# Patient Record
Sex: Female | Born: 1935 | Race: White | Hispanic: No | Smoking: Never smoker
Health system: Southern US, Community
[De-identification: ages and names within clinical notes are randomized; demographics above are authoritative.]

## PROBLEM LIST (undated history)

## (undated) DIAGNOSIS — Z8719 Personal history of other diseases of the digestive system: Secondary | ICD-10-CM

## (undated) DIAGNOSIS — F0390 Unspecified dementia without behavioral disturbance: Secondary | ICD-10-CM

## (undated) DIAGNOSIS — J189 Pneumonia, unspecified organism: Secondary | ICD-10-CM

## (undated) DIAGNOSIS — A071 Giardiasis [lambliasis]: Secondary | ICD-10-CM

## (undated) DIAGNOSIS — M1990 Unspecified osteoarthritis, unspecified site: Secondary | ICD-10-CM

## (undated) DIAGNOSIS — B80 Enterobiasis: Secondary | ICD-10-CM

## (undated) DIAGNOSIS — C50919 Malignant neoplasm of unspecified site of unspecified female breast: Secondary | ICD-10-CM

## (undated) DIAGNOSIS — K219 Gastro-esophageal reflux disease without esophagitis: Secondary | ICD-10-CM

## (undated) DIAGNOSIS — I509 Heart failure, unspecified: Secondary | ICD-10-CM

## (undated) DIAGNOSIS — D849 Immunodeficiency, unspecified: Secondary | ICD-10-CM

## (undated) DIAGNOSIS — E119 Type 2 diabetes mellitus without complications: Secondary | ICD-10-CM

## (undated) DIAGNOSIS — I350 Nonrheumatic aortic (valve) stenosis: Secondary | ICD-10-CM

## (undated) DIAGNOSIS — M353 Polymyalgia rheumatica: Secondary | ICD-10-CM

## (undated) DIAGNOSIS — I499 Cardiac arrhythmia, unspecified: Secondary | ICD-10-CM

## (undated) HISTORY — DX: Nonrheumatic aortic (valve) stenosis: I35.0

## (undated) HISTORY — PX: PARTIAL HYSTERECTOMY: SHX80

## (undated) HISTORY — DX: Type 2 diabetes mellitus without complications: E11.9

## (undated) HISTORY — PX: BREAST LUMPECTOMY: SHX2

## (undated) HISTORY — PX: TONSILLECTOMY: SUR1361

## (undated) HISTORY — PX: ABDOMINAL HYSTERECTOMY: SHX81

---

## 1999-10-09 ENCOUNTER — Encounter (INDEPENDENT_AMBULATORY_CARE_PROVIDER_SITE_OTHER): Payer: Self-pay | Admitting: Specialist

## 1999-10-09 ENCOUNTER — Encounter: Payer: Self-pay | Admitting: Surgery

## 1999-10-09 ENCOUNTER — Ambulatory Visit (HOSPITAL_COMMUNITY): Admission: RE | Admit: 1999-10-09 | Discharge: 1999-10-09 | Payer: Self-pay | Admitting: Surgery

## 1999-10-20 ENCOUNTER — Encounter: Admission: RE | Admit: 1999-10-20 | Discharge: 2000-01-18 | Payer: Self-pay | Admitting: Family Medicine

## 1999-10-24 ENCOUNTER — Encounter: Payer: Self-pay | Admitting: Surgery

## 1999-10-30 ENCOUNTER — Ambulatory Visit (HOSPITAL_COMMUNITY): Admission: RE | Admit: 1999-10-30 | Discharge: 1999-10-30 | Payer: Self-pay | Admitting: Surgery

## 1999-10-30 ENCOUNTER — Encounter: Payer: Self-pay | Admitting: Surgery

## 1999-10-30 ENCOUNTER — Encounter (INDEPENDENT_AMBULATORY_CARE_PROVIDER_SITE_OTHER): Payer: Self-pay | Admitting: Specialist

## 2000-01-19 ENCOUNTER — Encounter: Admission: RE | Admit: 2000-01-19 | Discharge: 2000-04-18 | Payer: Self-pay | Admitting: Radiation Oncology

## 2000-07-14 ENCOUNTER — Encounter: Admission: RE | Admit: 2000-07-14 | Discharge: 2000-07-14 | Payer: Self-pay | Admitting: Surgery

## 2000-07-14 ENCOUNTER — Encounter: Payer: Self-pay | Admitting: Surgery

## 2001-03-01 ENCOUNTER — Encounter (INDEPENDENT_AMBULATORY_CARE_PROVIDER_SITE_OTHER): Payer: Self-pay

## 2001-03-01 ENCOUNTER — Ambulatory Visit (HOSPITAL_COMMUNITY): Admission: RE | Admit: 2001-03-01 | Discharge: 2001-03-01 | Payer: Self-pay | Admitting: Gastroenterology

## 2001-05-02 ENCOUNTER — Encounter: Admission: RE | Admit: 2001-05-02 | Discharge: 2001-05-02 | Payer: Self-pay

## 2001-07-19 ENCOUNTER — Encounter: Admission: RE | Admit: 2001-07-19 | Discharge: 2001-07-19 | Payer: Self-pay | Admitting: Surgery

## 2001-07-19 ENCOUNTER — Encounter: Payer: Self-pay | Admitting: Surgery

## 2002-07-07 ENCOUNTER — Encounter: Admission: RE | Admit: 2002-07-07 | Discharge: 2002-07-07 | Payer: Self-pay | Admitting: Surgery

## 2002-07-07 ENCOUNTER — Encounter: Payer: Self-pay | Admitting: Surgery

## 2003-07-09 ENCOUNTER — Encounter: Admission: RE | Admit: 2003-07-09 | Discharge: 2003-07-09 | Payer: Self-pay | Admitting: Surgery

## 2003-07-09 ENCOUNTER — Encounter: Payer: Self-pay | Admitting: Surgery

## 2004-01-25 ENCOUNTER — Encounter: Admission: RE | Admit: 2004-01-25 | Discharge: 2004-01-25 | Payer: Self-pay | Admitting: Family Medicine

## 2004-08-22 ENCOUNTER — Encounter: Admission: RE | Admit: 2004-08-22 | Discharge: 2004-08-22 | Payer: Self-pay | Admitting: Surgery

## 2005-02-01 ENCOUNTER — Ambulatory Visit (HOSPITAL_COMMUNITY): Admission: RE | Admit: 2005-02-01 | Discharge: 2005-02-01 | Payer: Self-pay

## 2005-02-18 ENCOUNTER — Encounter: Admission: RE | Admit: 2005-02-18 | Discharge: 2005-02-18 | Payer: Self-pay

## 2005-02-27 ENCOUNTER — Ambulatory Visit (HOSPITAL_COMMUNITY): Admission: RE | Admit: 2005-02-27 | Discharge: 2005-02-27 | Payer: Self-pay

## 2005-04-21 ENCOUNTER — Encounter: Admission: RE | Admit: 2005-04-21 | Discharge: 2005-04-21 | Payer: Self-pay

## 2005-09-16 ENCOUNTER — Encounter: Admission: RE | Admit: 2005-09-16 | Discharge: 2005-09-16 | Payer: Self-pay | Admitting: Surgery

## 2005-10-12 ENCOUNTER — Encounter: Admission: RE | Admit: 2005-10-12 | Discharge: 2005-10-12 | Payer: Self-pay | Admitting: Surgery

## 2006-01-01 ENCOUNTER — Encounter: Admission: RE | Admit: 2006-01-01 | Discharge: 2006-01-01 | Payer: Self-pay

## 2006-10-18 ENCOUNTER — Encounter: Admission: RE | Admit: 2006-10-18 | Discharge: 2006-10-18 | Payer: Self-pay | Admitting: Surgery

## 2007-10-21 ENCOUNTER — Encounter: Admission: RE | Admit: 2007-10-21 | Discharge: 2007-10-21 | Payer: Self-pay | Admitting: Surgery

## 2008-10-22 ENCOUNTER — Encounter: Admission: RE | Admit: 2008-10-22 | Discharge: 2008-10-22 | Payer: Self-pay | Admitting: Family Medicine

## 2009-10-14 ENCOUNTER — Encounter: Admission: RE | Admit: 2009-10-14 | Discharge: 2009-10-14 | Payer: Self-pay | Admitting: Family Medicine

## 2010-10-18 ENCOUNTER — Encounter: Payer: Self-pay | Admitting: Surgery

## 2010-11-06 ENCOUNTER — Other Ambulatory Visit: Payer: Self-pay | Admitting: Family Medicine

## 2010-11-06 DIAGNOSIS — Z1239 Encounter for other screening for malignant neoplasm of breast: Secondary | ICD-10-CM

## 2010-12-15 ENCOUNTER — Ambulatory Visit
Admission: RE | Admit: 2010-12-15 | Discharge: 2010-12-15 | Disposition: A | Discharge status: Home / Self Care | Payer: Medicare Other | Source: Ambulatory Visit | Attending: Family Medicine | Admitting: Family Medicine

## 2010-12-15 DIAGNOSIS — Z1239 Encounter for other screening for malignant neoplasm of breast: Secondary | ICD-10-CM

## 2011-02-13 NOTE — Op Note (Signed)
[place]  Patient:    Martha Mcbride                        MRN: [id number] Proc. Date: 10/30/99 Adm. Date:  [id number] Disc. Date: [id number] Attending:  Charlton Haws                           Operative Report  ACCOUNT #:  [id number]  CCS#: [id number]  PREOPERATIVE DIAGNOSIS:  Carcinoma of right breast.  POSTOPERATIVE DIAGNOSIS:  Carcinoma of right breast.  OPERATION PERFORMED:  Needle localization excision of right breast cancer, lymphatic mapping, blue dye injection, sentinel node dissection.  SURGEON:  Currie Paris, M.D.  ANESTHESIA:  General endotracheal.  CLINICAL HISTORY:  The patient is a 77 year old with a small nodule on the right breast which large core biopsy indicated was malignant.  After a lengthy discussion with the patient, we elected, we elected to proceed to lumpectomy with sentinel  node dissection.  DESCRIPTION OF PROCEDURE:  The patient was brought to the operating room having had a guide wire placed by radiology and appropriate injection of radioactive isotope.  The patient was brought to the operating room and after satisfactory general endotracheal anesthesia had been obtained, the breast was prepped and draped. Actually prior to prepping, I injected about 4 cc of Lymphazurin blue around the lumpectomy site and a little bit underneath the nipple areolar complex.  This was massaged for five minutes.  Once the breast was prepped and draped, I made an elliptical incision going around the guide wire.  The guide wire entered at almost the midlateral position and tracked towards the nipple, so I started just lateral to the guide wire and took the incision all the way to the areolar margin.  I raised skin flaps in all directions staying initially superior, then lateral, then inferior and finally medial.  The incisions were then deepened almost to the chest wall in all directions, again starting  superior, then lateral, then inferior and then medial and the entire area around the guide wire excised.  The tip of the guide wire was found at the very medial aspect of the excision and the guide wire had been noted to go through the lesion and extend about 1.5 cm past the lesion.  This was sent for specimen localization and they subsequently called back indicating that the lesion was contained in the specimen.  To be sure I had absolutely good margins, I took another approximately 5 mm of margin in both superior and inferior aspects.  This was sent separately.  Bleeders were electrocoagulated.  When everything appeared dry, I put four metal clips to mark the area of excision and packed this wound waiting for pathology reports.  I then used the Neoprobe to identify and highlight an area in the axilla that appeared to be hot.  I could find no hot areas around the sternum.  A transverse incision was made over this area which was right at the axillary hair line. I went through the subcutaneous tissues and entered the axillary fat and initially saw a lymph node and using a Neoprobe, this was a quite hot lymph node. It was grasped with a Babcock and excised.  A little further dissection showed ome blue dye coming in and this was much more anterior and as the first node was almost in the middle of the axilla from an anterior-posterior direction.  The blue dye was traced and a small cluster of nodes, all of which had a little blue in them was  identified and excised.  This was sent as specimens.  These nodes were not hot but in using the probe, I found another hot area. This one adjacent to the first, did a little further dissection and found another node that appeared to be hot and this was excised and sent.  Pathology subsequently revealed that we had gotten a total of, I believe, six nodes, all of which were  negative.  This area was irrigated and when everything was dry,  closed with 3-0 Vicryl, followed by 4-0 Monocryl subcuticular.  The original lumpectomy site would not close with deep sutures as it deformed the breast, so I simply closed the skin ith staples.  I injected approximately 10 cc of 0.25% plain Marcaine in each site prior to closing to help with postoperative pain.  The patient tolerated the procedure well.  There were no operative complications. All counts were correct. DD:  10/30/99 TD:  10/31/99 Job: [id number]

## 2011-02-13 NOTE — Procedures (Signed)
[facility]  Patient:    Martha Mcbride, Martha Mcbride                       MRN: [id number] Proc. Date: 03/01/01 Adm. Date:  [id number] Attending:  Nelda Marseille CC:         Katherine Roan, M.D.  Dyanne Carrel, M.D.  Currie Paris, M.D.   Procedure Report  PROCEDURE:  Colonoscopy with polypectomy.  ENDOSCOPIST:  Petra Kuba, M.D.  INDICATIONS:  Patient with history of colon polyps and history of colon cancer due for colonic screening.  Consent was signed after risks, benefits, methods, and options were thoroughly discussed multiple times in the past.  MEDICINES USED:  Fentanyl 62.5 mcg, Versed 6 mg.  DESCRIPTION OF PROCEDURE:  Rectal inspection was pertinent for external hemorrhoids.  Digital exam was negative.  Video colonoscope was inserted and fairly easily advanced around the colon to the cecum.  This did require some abdominal pressure but no position changes.  The cecum was identified by the appendiceal orifice and the ileocecal valve.  In fact, the scope was inserted a short ways into the terminal ileum which was normal.  Photo documentation was obtained.  The scope was slowly withdrawn.  Prep was adequate.  There was some liquid stool that required washing and suctioning.  On slow withdrawal through the colon, the cecum, ascending, and transverse were normal.  In the proximal level of the splenic flexure, a tiny polyp was seen and was cold biopsied, and then a 4 mm sessile polyp was seen and was hot biopsied x 1. Both polyps were put in the same container.  The scope was further withdrawn. The sigmoid was tortuous, but no other polyps, lesions, masses, or other abnormalities were seen as we slowly withdrew back to the rectum.  Once back in the rectum, the scope retroflexed, pertinent for some internal hemorrhoids. The scope was then straightened; air was withdrawn, and the scope removed. The patient tolerated the procedure well.   There was no obvious immediate complication.  ENDOSCOPIC DIAGNOSIS: 1. Internal and external hemorrhoids. 2. Two tiny to small splenic flexure polyps, one cold biopsied and one hot    biopsied. 3. Otherwise within normal limits to the terminal ileum.  PLAN:  Yearly rectals and guaiacs per either Dr. Elana Alm or Dr. Manus Gunning. Happy to see back p.r.n., otherwise await pathology but consider repeat screening in five years if doing well medically, happy to see back sooner p.r.n. DD:  03/01/01 TD:  03/01/01 Job: [id number]

## 2011-05-14 ENCOUNTER — Other Ambulatory Visit: Payer: Self-pay | Admitting: Gastroenterology

## 2011-06-05 ENCOUNTER — Ambulatory Visit
Admission: RE | Admit: 2011-06-05 | Discharge: 2011-06-05 | Disposition: A | Discharge status: Home / Self Care | Payer: Medicare Other | Source: Ambulatory Visit | Attending: Family Medicine | Admitting: Family Medicine

## 2011-06-05 ENCOUNTER — Other Ambulatory Visit: Payer: Self-pay | Admitting: Family Medicine

## 2011-06-05 DIAGNOSIS — R42 Dizziness and giddiness: Secondary | ICD-10-CM

## 2011-10-01 ENCOUNTER — Other Ambulatory Visit: Payer: Self-pay | Admitting: Family Medicine

## 2011-10-01 ENCOUNTER — Ambulatory Visit
Admission: RE | Admit: 2011-10-01 | Discharge: 2011-10-01 | Disposition: A | Discharge status: Home / Self Care | Payer: Medicare Other | Source: Ambulatory Visit | Attending: Family Medicine | Admitting: Family Medicine

## 2011-10-01 DIAGNOSIS — R200 Anesthesia of skin: Secondary | ICD-10-CM

## 2011-10-01 DIAGNOSIS — R531 Weakness: Secondary | ICD-10-CM

## 2011-10-14 ENCOUNTER — Other Ambulatory Visit: Payer: Self-pay | Admitting: Internal Medicine

## 2011-10-14 DIAGNOSIS — M549 Dorsalgia, unspecified: Secondary | ICD-10-CM

## 2011-10-15 ENCOUNTER — Ambulatory Visit
Admission: RE | Admit: 2011-10-15 | Discharge: 2011-10-15 | Disposition: A | Discharge status: Home / Self Care | Payer: Medicare Other | Source: Ambulatory Visit | Attending: Internal Medicine | Admitting: Internal Medicine

## 2011-10-15 DIAGNOSIS — M549 Dorsalgia, unspecified: Secondary | ICD-10-CM

## 2011-10-22 ENCOUNTER — Other Ambulatory Visit: Payer: Medicare Other

## 2011-11-05 ENCOUNTER — Ambulatory Visit: Payer: Medicare Other | Admitting: Rehabilitative and Restorative Service Providers"

## 2011-11-12 ENCOUNTER — Ambulatory Visit: Payer: Medicare Other | Attending: Family Medicine | Admitting: Rehabilitative and Restorative Service Providers"

## 2011-11-12 ENCOUNTER — Encounter: Payer: Medicare Other | Admitting: Rehabilitative and Restorative Service Providers"

## 2011-11-12 DIAGNOSIS — IMO0001 Reserved for inherently not codable concepts without codable children: Secondary | ICD-10-CM | POA: Insufficient documentation

## 2011-11-12 DIAGNOSIS — H8110 Benign paroxysmal vertigo, unspecified ear: Secondary | ICD-10-CM | POA: Insufficient documentation

## 2011-11-12 DIAGNOSIS — R269 Unspecified abnormalities of gait and mobility: Secondary | ICD-10-CM | POA: Insufficient documentation

## 2011-11-17 ENCOUNTER — Ambulatory Visit: Payer: Medicare Other | Admitting: Rehabilitative and Restorative Service Providers"

## 2011-11-17 ENCOUNTER — Other Ambulatory Visit: Payer: Self-pay | Admitting: Family Medicine

## 2011-11-17 DIAGNOSIS — Z1231 Encounter for screening mammogram for malignant neoplasm of breast: Secondary | ICD-10-CM

## 2011-11-20 ENCOUNTER — Ambulatory Visit: Payer: Medicare Other | Admitting: Physical Therapy

## 2011-11-23 ENCOUNTER — Ambulatory Visit: Payer: Medicare Other | Admitting: Rehabilitative and Restorative Service Providers"

## 2011-11-24 ENCOUNTER — Ambulatory Visit: Payer: Medicare Other | Admitting: Rehabilitative and Restorative Service Providers"

## 2011-11-26 ENCOUNTER — Encounter: Payer: Medicare Other | Admitting: Rehabilitative and Restorative Service Providers"

## 2011-11-30 ENCOUNTER — Ambulatory Visit: Payer: Medicare Other | Attending: Family Medicine | Admitting: Rehabilitative and Restorative Service Providers"

## 2011-11-30 DIAGNOSIS — IMO0001 Reserved for inherently not codable concepts without codable children: Secondary | ICD-10-CM | POA: Insufficient documentation

## 2011-11-30 DIAGNOSIS — R269 Unspecified abnormalities of gait and mobility: Secondary | ICD-10-CM | POA: Insufficient documentation

## 2011-11-30 DIAGNOSIS — H8110 Benign paroxysmal vertigo, unspecified ear: Secondary | ICD-10-CM | POA: Insufficient documentation

## 2011-12-03 ENCOUNTER — Ambulatory Visit: Payer: Medicare Other | Admitting: Rehabilitative and Restorative Service Providers"

## 2011-12-07 ENCOUNTER — Ambulatory Visit: Payer: Medicare Other | Admitting: Rehabilitative and Restorative Service Providers"

## 2011-12-10 ENCOUNTER — Ambulatory Visit: Payer: Medicare Other | Admitting: Rehabilitative and Restorative Service Providers"

## 2011-12-14 ENCOUNTER — Ambulatory Visit: Payer: Medicare Other | Admitting: Rehabilitative and Restorative Service Providers"

## 2011-12-16 ENCOUNTER — Ambulatory Visit: Payer: Medicare Other | Admitting: Rehabilitative and Restorative Service Providers"

## 2011-12-21 ENCOUNTER — Ambulatory Visit: Payer: Medicare Other | Admitting: Rehabilitative and Restorative Service Providers"

## 2011-12-21 ENCOUNTER — Ambulatory Visit
Admission: RE | Admit: 2011-12-21 | Discharge: 2011-12-21 | Disposition: A | Discharge status: Home / Self Care | Payer: Medicare Other | Source: Ambulatory Visit | Attending: Family Medicine | Admitting: Family Medicine

## 2011-12-21 DIAGNOSIS — Z1231 Encounter for screening mammogram for malignant neoplasm of breast: Secondary | ICD-10-CM

## 2011-12-22 ENCOUNTER — Ambulatory Visit: Payer: Medicare Other | Admitting: Rehabilitative and Restorative Service Providers"

## 2011-12-28 ENCOUNTER — Ambulatory Visit: Payer: Medicare Other | Attending: Family Medicine | Admitting: Rehabilitative and Restorative Service Providers"

## 2011-12-28 DIAGNOSIS — IMO0001 Reserved for inherently not codable concepts without codable children: Secondary | ICD-10-CM | POA: Insufficient documentation

## 2011-12-28 DIAGNOSIS — R269 Unspecified abnormalities of gait and mobility: Secondary | ICD-10-CM | POA: Insufficient documentation

## 2011-12-28 DIAGNOSIS — H8110 Benign paroxysmal vertigo, unspecified ear: Secondary | ICD-10-CM | POA: Insufficient documentation

## 2011-12-30 ENCOUNTER — Ambulatory Visit: Payer: Medicare Other | Admitting: Rehabilitative and Restorative Service Providers"

## 2012-01-04 ENCOUNTER — Encounter: Payer: Medicare Other | Admitting: Rehabilitative and Restorative Service Providers"

## 2012-01-06 ENCOUNTER — Ambulatory Visit: Payer: Medicare Other | Admitting: Rehabilitative and Restorative Service Providers"

## 2012-01-11 ENCOUNTER — Ambulatory Visit: Payer: Medicare Other | Admitting: Rehabilitative and Restorative Service Providers"

## 2012-01-13 ENCOUNTER — Encounter: Payer: Medicare Other | Admitting: Physical Therapy

## 2012-01-18 ENCOUNTER — Ambulatory Visit: Payer: Medicare Other | Admitting: Rehabilitative and Restorative Service Providers"

## 2012-01-20 ENCOUNTER — Ambulatory Visit: Payer: Medicare Other | Admitting: Rehabilitative and Restorative Service Providers"

## 2012-01-25 ENCOUNTER — Ambulatory Visit: Payer: Medicare Other | Admitting: Rehabilitative and Restorative Service Providers"

## 2012-02-17 ENCOUNTER — Encounter: Payer: Medicare Other | Admitting: Rehabilitative and Restorative Service Providers"

## 2012-02-23 ENCOUNTER — Encounter: Payer: Medicare Other | Admitting: Rehabilitative and Restorative Service Providers"

## 2012-03-04 ENCOUNTER — Encounter: Payer: Medicare Other | Admitting: Rehabilitative and Restorative Service Providers"

## 2012-03-15 ENCOUNTER — Ambulatory Visit: Payer: Medicare Other | Attending: Family Medicine | Admitting: Rehabilitative and Restorative Service Providers"

## 2012-03-15 DIAGNOSIS — R269 Unspecified abnormalities of gait and mobility: Secondary | ICD-10-CM | POA: Insufficient documentation

## 2012-03-15 DIAGNOSIS — IMO0001 Reserved for inherently not codable concepts without codable children: Secondary | ICD-10-CM | POA: Insufficient documentation

## 2012-03-15 DIAGNOSIS — H8110 Benign paroxysmal vertigo, unspecified ear: Secondary | ICD-10-CM | POA: Insufficient documentation

## 2012-04-13 ENCOUNTER — Other Ambulatory Visit: Payer: Self-pay | Admitting: Dermatology

## 2012-05-21 ENCOUNTER — Emergency Department (HOSPITAL_COMMUNITY): Payer: Medicare Other

## 2012-05-21 ENCOUNTER — Encounter (HOSPITAL_COMMUNITY): Payer: Self-pay

## 2012-05-21 ENCOUNTER — Observation Stay (HOSPITAL_COMMUNITY)
Admission: EM | Admit: 2012-05-21 | Discharge: 2012-05-21 | Disposition: A | Discharge status: Home / Self Care | Payer: Medicare Other | Attending: Emergency Medicine | Admitting: Emergency Medicine

## 2012-05-21 DIAGNOSIS — R112 Nausea with vomiting, unspecified: Secondary | ICD-10-CM

## 2012-05-21 DIAGNOSIS — D72829 Elevated white blood cell count, unspecified: Secondary | ICD-10-CM | POA: Insufficient documentation

## 2012-05-21 DIAGNOSIS — M3320 Polymyositis, organ involvement unspecified: Secondary | ICD-10-CM | POA: Insufficient documentation

## 2012-05-21 DIAGNOSIS — R109 Unspecified abdominal pain: Secondary | ICD-10-CM | POA: Insufficient documentation

## 2012-05-21 DIAGNOSIS — IMO0002 Reserved for concepts with insufficient information to code with codable children: Secondary | ICD-10-CM | POA: Insufficient documentation

## 2012-05-21 DIAGNOSIS — R197 Diarrhea, unspecified: Principal | ICD-10-CM

## 2012-05-21 DIAGNOSIS — N83209 Unspecified ovarian cyst, unspecified side: Secondary | ICD-10-CM | POA: Insufficient documentation

## 2012-05-21 DIAGNOSIS — N83299 Other ovarian cyst, unspecified side: Secondary | ICD-10-CM

## 2012-05-21 HISTORY — DX: Immunodeficiency, unspecified: D84.9

## 2012-05-21 HISTORY — DX: Polymyalgia rheumatica: M35.3

## 2012-05-21 HISTORY — DX: Unspecified osteoarthritis, unspecified site: M19.90

## 2012-05-21 LAB — URINALYSIS, ROUTINE W REFLEX MICROSCOPIC
Bilirubin Urine: NEGATIVE
Glucose, UA: NEGATIVE mg/dL
Ketones, ur: NEGATIVE mg/dL
Leukocytes, UA: NEGATIVE
Nitrite: NEGATIVE
Protein, ur: 30 mg/dL — AB
Specific Gravity, Urine: 1.021 (ref 1.005–1.030)
Urobilinogen, UA: 0.2 mg/dL (ref 0.0–1.0)
pH: 6 (ref 5.0–8.0)

## 2012-05-21 LAB — CBC WITH DIFFERENTIAL/PLATELET
Basophils Absolute: 0 10*3/uL (ref 0.0–0.1)
Basophils Relative: 0 % (ref 0–1)
Eosinophils Absolute: 0 10*3/uL (ref 0.0–0.7)
Eosinophils Relative: 0 % (ref 0–5)
HCT: 37.9 % (ref 36.0–46.0)
Hemoglobin: 12.5 g/dL (ref 12.0–15.0)
Lymphocytes Relative: 8 % — ABNORMAL LOW (ref 12–46)
Lymphs Abs: 1.8 10*3/uL (ref 0.7–4.0)
MCH: 27.3 pg (ref 26.0–34.0)
MCHC: 33 g/dL (ref 30.0–36.0)
MCV: 82.8 fL (ref 78.0–100.0)
Monocytes Absolute: 1.2 10*3/uL — ABNORMAL HIGH (ref 0.1–1.0)
Monocytes Relative: 5 % (ref 3–12)
Neutro Abs: 18.5 10*3/uL — ABNORMAL HIGH (ref 1.7–7.7)
Neutrophils Relative %: 86 % — ABNORMAL HIGH (ref 43–77)
Platelets: 301 10*3/uL (ref 150–400)
RBC: 4.58 MIL/uL (ref 3.87–5.11)
RDW: 16.5 % — ABNORMAL HIGH (ref 11.5–15.5)
WBC: 21.5 10*3/uL — ABNORMAL HIGH (ref 4.0–10.5)

## 2012-05-21 LAB — COMPREHENSIVE METABOLIC PANEL
ALT: 13 U/L (ref 0–35)
AST: 18 U/L (ref 0–37)
Albumin: 3.3 g/dL — ABNORMAL LOW (ref 3.5–5.2)
Alkaline Phosphatase: 47 U/L (ref 39–117)
BUN: 14 mg/dL (ref 6–23)
CO2: 23 mEq/L (ref 19–32)
Calcium: 8.9 mg/dL (ref 8.4–10.5)
Chloride: 98 mEq/L (ref 96–112)
Creatinine, Ser: 0.8 mg/dL (ref 0.50–1.10)
GFR calc Af Amer: 76 mL/min — ABNORMAL LOW (ref >90)
GFR calc non Af Amer: 65 mL/min — ABNORMAL LOW (ref >90)
Glucose, Bld: 132 mg/dL — ABNORMAL HIGH (ref 70–99)
Potassium: 3.7 mEq/L (ref 3.5–5.1)
Sodium: 134 mEq/L — ABNORMAL LOW (ref 135–145)
Total Bilirubin: 0.4 mg/dL (ref 0.3–1.2)
Total Protein: 7.1 g/dL (ref 6.0–8.3)

## 2012-05-21 LAB — URINE MICROSCOPIC-ADD ON

## 2012-05-21 MED ORDER — SODIUM CHLORIDE 0.9 % IV BOLUS (SEPSIS)
1000.0000 mL | Freq: Once | INTRAVENOUS | Status: AC
  Administered 2012-05-21: 1000 mL via INTRAVENOUS

## 2012-05-21 MED ORDER — ONDANSETRON 8 MG/NS 50 ML IVPB: 8.0000 mg | Freq: Once | INTRAVENOUS | Status: DC

## 2012-05-21 MED ORDER — IOHEXOL 300 MG/ML  SOLN
100.0000 mL | Freq: Once | INTRAMUSCULAR | Status: AC | PRN
  Administered 2012-05-21: 100 mL via INTRAVENOUS

## 2012-05-21 MED ORDER — ONDANSETRON HCL 4 MG PO TABS: 4.0000 mg | ORAL_TABLET | Freq: Four times a day (QID) | ORAL | Status: AC

## 2012-05-21 MED ORDER — ONDANSETRON HCL 4 MG/2ML IJ SOLN
4.0000 mg | Freq: Once | INTRAMUSCULAR | Status: AC
  Administered 2012-05-21: 4 mg via INTRAVENOUS
  Filled 2012-05-21: qty 2

## 2012-05-21 MED ORDER — MECLIZINE HCL 25 MG PO TABS: 25.0000 mg | ORAL_TABLET | Freq: Once | ORAL | Status: DC

## 2012-05-21 MED ORDER — IOHEXOL 300 MG/ML  SOLN
20.0000 mL | INTRAMUSCULAR | Status: AC
  Administered 2012-05-21: 20 mL via ORAL

## 2012-05-21 NOTE — ED Provider Notes (Signed)
History    87yF with n/v/d. Onset early this morning. Multiple episodes of each. NBNB emesis and no blood in stool or melena. No fever or chills. Family concerned that pt seemed disoriented when they first found her. Currently improved and back to baseline. No numbness, tingling or loss of strength. No fever or chills. Pt denies abdominal pain. No urinary complaints. No sick contacts. No prior hx of abdominal surgery.  CSN: [id number]  Arrival date & time 05/21/12  0919   First MD Initiated Contact with Patient 05/21/12 [phone]      Chief Complaint  Patient presents with  . Diarrhea    (Consider location/radiation/quality/duration/timing/severity/associated sxs/prior treatment) HPI  Past Medical History  Diagnosis Date  . Arthritis   . Polymyalgia   . Immune deficiency disorder     Past Surgical History  Procedure Date  . Breast lumpectomy     No family history on file.  History  Substance Use Topics  . Smoking status: Never Smoker   . Smokeless tobacco: Not on file  . Alcohol Use: No    OB History    Grav Para Term Preterm Abortions TAB SAB Ect Mult Living                  Review of Systems  Review of symptoms negative unless otherwise noted in HPI.  Allergies  Doxycycline and Sulfa antibiotics  Home Medications   Current Outpatient Rx  Name Route Sig Dispense Refill  . VIACTIV PO Oral Take 1 tablet by mouth daily.    Marland Kitchen PRESCRIPTION MEDICATION Oral Take 15 mg by mouth daily. Prednisone.    Marland Kitchen RABEPRAZOLE SODIUM 20 MG PO TBEC Oral Take 20 mg by mouth daily.      BP 160/55  Pulse 95  Temp 98.7 F (37.1 C) (Oral)  Resp 14  SpO2 97%  Physical Exam  Nursing note and vitals reviewed. Constitutional: She is oriented to person, place, and time. She appears well-developed and well-nourished. No distress.       Laying in bed. NAD. Pleasant.  HENT:  Head: Normocephalic and atraumatic.  Eyes: Conjunctivae are normal. Pupils are equal, round, and reactive to  light. Right eye exhibits no discharge. Left eye exhibits no discharge.  Neck: Neck supple.  Cardiovascular: Normal rate and regular rhythm.  Exam reveals no gallop and no friction rub.   Murmur heard. Pulmonary/Chest: Effort normal and breath sounds normal. No respiratory distress.  Abdominal: Soft. She exhibits no distension and no mass. There is no tenderness. There is no rebound and no guarding.  Musculoskeletal: She exhibits no edema and no tenderness.  Neurological: She is alert and oriented to person, place, and time. No cranial nerve deficit. She exhibits normal muscle tone. Coordination normal.  Skin: Skin is warm and dry. She is not diaphoretic.  Psychiatric: She has a normal mood and affect. Her behavior is normal. Thought content normal.    ED Course  Procedures (including critical care time)  Labs Reviewed  URINALYSIS, ROUTINE W REFLEX MICROSCOPIC - Abnormal; Notable for the following:    Hgb urine dipstick SMALL (*)     Protein, ur 30 (*)     All other components within normal limits  COMPREHENSIVE METABOLIC PANEL - Abnormal; Notable for the following:    Sodium 134 (*)     Glucose, Bld 132 (*)     Albumin 3.3 (*)     GFR calc non Af Amer 65 (*)     GFR calc Af Denyse Dago  76 (*)     All other components within normal limits  CBC WITH DIFFERENTIAL - Abnormal; Notable for the following:    WBC 21.5 (*)     RDW 16.5 (*)     Neutrophils Relative 86 (*)     Neutro Abs 18.5 (*)     Lymphocytes Relative 8 (*)     Monocytes Absolute 1.2 (*)     All other components within normal limits  URINE MICROSCOPIC-ADD ON   Ct Abdomen Pelvis W Contrast  05/21/2012  **ADDENDUM** CREATED: 05/21/2012 15:32:20  4 mm pulmonary nodules.  The findings are nonspecific. If the patient is at high risk for bronchogenic carcinoma, follow-up chest CT at 1 year is recommended.  If the patient is at low risk, no follow-up is needed.  This recommendation follows the consensus statement: Guidelines for  Management of Small Pulmonary Nodules Detected on CT Scans:  A Statement from the Fleischner Society as published in Radiology 2005; 237:395-400.  **END ADDENDUM** SIGNED BY: Richarda Overlie, M.D.   05/21/2012  *RADIOLOGY REPORT*  Clinical Data: Vomiting and abdominal pain.  CT ABDOMEN AND PELVIS WITH CONTRAST  Technique:  Multidetector CT imaging of the abdomen and pelvis was performed following the standard protocol during bolus administration of intravenous contrast.  Contrast: OMNIPAQUE IOHEXOL 300 MG/ML  SOLN  Comparison: 04/21/2005  Findings: There is a large hiatal hernia which has enlarged in the interim.  Prominent interstitial lung markings suggest chronic changes with multiple small blebs at the base of the right middle lobe.  Large mitral annular calcifications.  No evidence of free air.  Images are limited due to excessive patient motion.  Question a small nodule in the lingula on image #2, series #5. There is a 4 mm nodule in the right lower lung on image #5, sequence #5.  Low density structures within the liver suggestive for cysts.  No gross abnormality of the liver or portal venous system.  Evidence for multiple gallstones.  Gallbladder is not distended. Evidence for small cysts in the right kidney.  No gross abnormality to the spleen, pancreas or adrenal tissue.  There is a 6.7 x 6.2 x 5.6 cm structure in the right hemi pelvis. There may be a septation or adjacent cyst along the superior aspect of this cystic lesion. There is no contrast within the structure on the delayed images which suggests against a bladder diverticulum.  It is difficult to exclude mild edema around the pancreas but this area difficult to evaluate due to patient motion.  There are old right pubic rami fractures.  Old right rib fractures.  IMPRESSION: The study has limitations due to patient motion.  There is a fluid filled structure in the right hemi pelvis that measures up to 6.7 cm.  Findings are suggestive for a cyst.   There may be an adjacent small cyst or septation along the superior aspect of the cyst.  Findings could represent a peritoneal inclusion cyst or adnexal cyst.  Recommend further characterization with a pelvic ultrasound.  Cholelithiasis.  Mild stranding around the pancreas may be associated with the patient motion.  Recommend clinical correlation with regards to pancreatitis.  Large hiatal hernia.   Original Report Authenticated By: Richarda Overlie, M.D.      1. Nausea and vomiting   2. Diarrhea       MDM  87yF with n/v/d since early this morning. Benign abdominal exam. I suspect this is a viral illness. Pt currently feeling much better. Remains pain  free in ED and no more vomiting or diarrhea. Because of pt age though and significant leukocytosis, CT a/p was done. Questionable pancreatic inflammatory changes which does not clinically correlate. Pt has no tenderness in epigastrium or elsewhere for that matter. Tolerating PO. Cyst in pelvis noted though. [place] per radiology recommendations. Discussed having done as outpt. Apparently strong family hx of CA and multiple family members concerned. Do not feel unreasonable to have in ED prior to DC. GYN f/u if benign appearance or further testing recommended. Gyn/Onc if appears may be malignant. Incidental murmur noted on exam. Pt with no prior knowledge of one. Routine outpt cardiology FU. Pt reports previous catheterization but cannot remember by whom. Cards referral information provided.        Raeford Razor, MD 05/25/12 [phone]

## 2012-05-21 NOTE — ED Provider Notes (Signed)
Assumed pt care in CDU.  78 year old female presents for evaluation of diarrhea.  She was found to have leukocytosis with WBC 20+.  Sxs were suggestive with viral illness.  An abd/pelvic CT scan shows no acute finding except for cystic lesions in right hemi pelvis.  Due to family's concern of cancer, a pelvic US has been ordered.  Will dispo and refer pt based on result of [place].    6:34 PM Pelvic US result shows a mildly complex R ovarian/adnexal cyst which has increased in size since 2006.  Cystic neoplasm is not excluded, therefore i will consult unassigned gynecology.    6:52 PM  Pt currently taking Prednisone for polymyositis, therefore it is suspect that her leukocytosis is secondary to steroid use.  Pt has GI viral syndrome.  She has no B sxs suggestive of cancer.   I have consulted with Dr. Filbert Berthold, who agrees to have pt f/u outpatient for further evaluation.  I encourage PO fluid hydration with gatorade.  Strict return precaution given.  Family member will be available to assists pt once discharge.    Results for orders placed during the hospital encounter of 05/21/12  URINALYSIS, ROUTINE W REFLEX MICROSCOPIC      Component Value Range   Color, Urine YELLOW  YELLOW   APPearance CLEAR  CLEAR   Specific Gravity, Urine 1.021  1.005 - 1.030   pH 6.0  5.0 - 8.0   Glucose, UA NEGATIVE  NEGATIVE mg/dL   Hgb urine dipstick SMALL (*) NEGATIVE   Bilirubin Urine NEGATIVE  NEGATIVE   Ketones, ur NEGATIVE  NEGATIVE mg/dL   Protein, ur 30 (*) NEGATIVE mg/dL   Urobilinogen, UA 0.2  0.0 - 1.0 mg/dL   Nitrite NEGATIVE  NEGATIVE   Leukocytes, UA NEGATIVE  NEGATIVE  COMPREHENSIVE METABOLIC PANEL      Component Value Range   Sodium 134 (*) 135 - 145 mEq/L   Potassium 3.7  3.5 - 5.1 mEq/L   Chloride 98  96 - 112 mEq/L   CO2 23  19 - 32 mEq/L   Glucose, Bld 132 (*) 70 - 99 mg/dL   BUN 14  6 - 23 mg/dL   Creatinine, Ser 1.61  0.50 - 1.10 mg/dL   Calcium 8.9  8.4 - 09.6 mg/dL   Total Protein  7.1  6.0 - 8.3 g/dL   Albumin 3.3 (*) 3.5 - 5.2 g/dL   AST 18  0 - 37 U/L   ALT 13  0 - 35 U/L   Alkaline Phosphatase 47  39 - 117 U/L   Total Bilirubin 0.4  0.3 - 1.2 mg/dL   GFR calc non Af Amer 65 (*) >90 mL/min   GFR calc Af Amer 76 (*) >90 mL/min  CBC WITH DIFFERENTIAL      Component Value Range   WBC 21.5 (*) 4.0 - 10.5 K/uL   RBC 4.58  3.87 - 5.11 MIL/uL   Hemoglobin 12.5  12.0 - 15.0 g/dL   HCT 04.5  40.9 - 81.1 %   MCV 82.8  78.0 - 100.0 fL   MCH 27.3  26.0 - 34.0 pg   MCHC 33.0  30.0 - 36.0 g/dL   RDW 91.4 (*) 78.2 - 95.6 %   Platelets 301  150 - 400 K/uL   Neutrophils Relative 86 (*) 43 - 77 %   Neutro Abs 18.5 (*) 1.7 - 7.7 K/uL   Lymphocytes Relative 8 (*) 12 - 46 %   Lymphs  Abs 1.8  0.7 - 4.0 K/uL   Monocytes Relative 5  3 - 12 %   Monocytes Absolute 1.2 (*) 0.1 - 1.0 K/uL   Eosinophils Relative 0  0 - 5 %   Eosinophils Absolute 0.0  0.0 - 0.7 K/uL   Basophils Relative 0  0 - 1 %   Basophils Absolute 0.0  0.0 - 0.1 K/uL  URINE MICROSCOPIC-ADD ON      Component Value Range   Squamous Epithelial / LPF RARE  RARE   WBC, UA 0-2  <3 WBC/hpf   RBC / HPF 0-2  <3 RBC/hpf   US Pelvis Complete  05/21/2012  *RADIOLOGY REPORT*  Clinical Data: 78 year old female with right adnexal cyst identified on recent CT.  History of hysterectomy.  TRANSABDOMINAL ULTRASOUND OF PELVIS  Technique:  Transabdominal ultrasound examination of the pelvis was performed including evaluation of the uterus, ovaries, adnexal regions, and pelvic cul-de-sac.  Comparison:  05/21/2012, 04/21/2005 and prior CTs  Findings:  Uterus:  The uterus is not visualized compatible with hysterectomy.  Right ovary: A 6.2 x 3 x 5.7 cm mildly complex right ovarian/adnexal cyst is present. No normal right ovarian tissue is identified.  Left ovary: The left ovary is not visualized.  Other Findings:  There is no evidence of free fluid or left adnexal mass.  IMPRESSION: 6.2 x 3 x 5.7 cm mildly complex right ovarian/adnexal  cyst, increased in size since 2006. Cystic neoplasm is not excluded and recommend GYN consultation.   Original Report Authenticated By: Rosendo Gros, M.D.    Ct Abdomen Pelvis W Contrast  05/21/2012  **ADDENDUM** CREATED: 05/21/2012 15:32:20  4 mm pulmonary nodules.  The findings are nonspecific. If the patient is at high risk for bronchogenic carcinoma, follow-up chest CT at 1 year is recommended.  If the patient is at low risk, no follow-up is needed.  This recommendation follows the consensus statement: Guidelines for Management of Small Pulmonary Nodules Detected on CT Scans:  A Statement from the Fleischner Society as published in Radiology 2005; 237:395-400.  **END ADDENDUM** SIGNED BY: Richarda Overlie, M.D.   05/21/2012  *RADIOLOGY REPORT*  Clinical Data: Vomiting and abdominal pain.  CT ABDOMEN AND PELVIS WITH CONTRAST  Technique:  Multidetector CT imaging of the abdomen and pelvis was performed following the standard protocol during bolus administration of intravenous contrast.  Contrast: OMNIPAQUE IOHEXOL 300 MG/ML  SOLN  Comparison: 04/21/2005  Findings: There is a large hiatal hernia which has enlarged in the interim.  Prominent interstitial lung markings suggest chronic changes with multiple small blebs at the base of the right middle lobe.  Large mitral annular calcifications.  No evidence of free air.  Images are limited due to excessive patient motion.  Question a small nodule in the lingula on image #2, series #5. There is a 4 mm nodule in the right lower lung on image #5, sequence #5.  Low density structures within the liver suggestive for cysts.  No gross abnormality of the liver or portal venous system.  Evidence for multiple gallstones.  Gallbladder is not distended. Evidence for small cysts in the right kidney.  No gross abnormality to the spleen, pancreas or adrenal tissue.  There is a 6.7 x 6.2 x 5.6 cm structure in the right hemi pelvis. There may be a septation or adjacent cyst along  the superior aspect of this cystic lesion. There is no contrast within the structure on the delayed images which suggests against a bladder diverticulum.  It is difficult to exclude mild edema around the pancreas but this area difficult to evaluate due to patient motion.  There are old right pubic rami fractures.  Old right rib fractures.  IMPRESSION: The study has limitations due to patient motion.  There is a fluid filled structure in the right hemi pelvis that measures up to 6.7 cm.  Findings are suggestive for a cyst.  There may be an adjacent small cyst or septation along the superior aspect of the cyst.  Findings could represent a peritoneal inclusion cyst or adnexal cyst.  Recommend further characterization with a pelvic ultrasound.  Cholelithiasis.  Mild stranding around the pancreas may be associated with the patient motion.  Recommend clinical correlation with regards to pancreatitis.  Large hiatal hernia.   Original Report Authenticated By: Richarda Overlie, M.D.       Fayrene Helper, PA-C 05/21/12 1855

## 2012-05-21 NOTE — ED Notes (Signed)
Spoke with CDU RN, Lawson Fiscal.  CDU currently has a wait list.  Placed pt on wait list.  Lawson Fiscal, RN will call when ready for pt.

## 2012-05-21 NOTE — ED Notes (Signed)
Per EMS and pt's daughter, pt had dinner with family last night and was fine when they left her.  Pt found this morning to be covered in feces with no recollection of what happened.  Pt denies falling or pain.  Pt's daughter states that pt told her she had N/V.  No N/V/D at this time.  Pt's daughter also concerned b/c she feels pt's hands are discolored.

## 2012-05-22 NOTE — ED Provider Notes (Signed)
Medical screening examination/treatment/procedure(s) were performed by non-physician practitioner and as supervising physician I was immediately available for consultation/collaboration.  Griffyn Kucinski, MD 05/22/12 0751 

## 2012-11-29 ENCOUNTER — Other Ambulatory Visit: Payer: Self-pay

## 2012-11-29 DIAGNOSIS — Z1231 Encounter for screening mammogram for malignant neoplasm of breast: Secondary | ICD-10-CM

## 2012-12-26 ENCOUNTER — Ambulatory Visit
Admission: RE | Admit: 2012-12-26 | Discharge: 2012-12-26 | Disposition: A | Discharge status: Home / Self Care | Payer: Medicare Other | Source: Ambulatory Visit

## 2012-12-26 DIAGNOSIS — Z1231 Encounter for screening mammogram for malignant neoplasm of breast: Secondary | ICD-10-CM

## 2013-06-07 ENCOUNTER — Other Ambulatory Visit: Payer: Self-pay | Admitting: Dermatology

## 2014-02-10 ENCOUNTER — Emergency Department (HOSPITAL_BASED_OUTPATIENT_CLINIC_OR_DEPARTMENT_OTHER): Payer: Medicare Other

## 2014-02-10 ENCOUNTER — Encounter (HOSPITAL_BASED_OUTPATIENT_CLINIC_OR_DEPARTMENT_OTHER): Payer: Self-pay | Admitting: Emergency Medicine

## 2014-02-10 ENCOUNTER — Emergency Department (HOSPITAL_BASED_OUTPATIENT_CLINIC_OR_DEPARTMENT_OTHER)
Admission: EM | Admit: 2014-02-10 | Discharge: 2014-02-10 | Disposition: A | Discharge status: Home / Self Care | Payer: Medicare Other | Attending: Emergency Medicine | Admitting: Emergency Medicine

## 2014-02-10 DIAGNOSIS — Z8739 Personal history of other diseases of the musculoskeletal system and connective tissue: Secondary | ICD-10-CM | POA: Insufficient documentation

## 2014-02-10 DIAGNOSIS — R079 Chest pain, unspecified: Secondary | ICD-10-CM | POA: Insufficient documentation

## 2014-02-10 DIAGNOSIS — Z8639 Personal history of other endocrine, nutritional and metabolic disease: Secondary | ICD-10-CM | POA: Insufficient documentation

## 2014-02-10 DIAGNOSIS — R4789 Other speech disturbances: Secondary | ICD-10-CM | POA: Insufficient documentation

## 2014-02-10 DIAGNOSIS — IMO0002 Reserved for concepts with insufficient information to code with codable children: Secondary | ICD-10-CM | POA: Insufficient documentation

## 2014-02-10 DIAGNOSIS — Z79899 Other long term (current) drug therapy: Secondary | ICD-10-CM | POA: Insufficient documentation

## 2014-02-10 DIAGNOSIS — K219 Gastro-esophageal reflux disease without esophagitis: Secondary | ICD-10-CM | POA: Insufficient documentation

## 2014-02-10 DIAGNOSIS — Z853 Personal history of malignant neoplasm of breast: Secondary | ICD-10-CM | POA: Insufficient documentation

## 2014-02-10 DIAGNOSIS — R4702 Dysphasia: Secondary | ICD-10-CM

## 2014-02-10 DIAGNOSIS — Z862 Personal history of diseases of the blood and blood-forming organs and certain disorders involving the immune mechanism: Secondary | ICD-10-CM | POA: Insufficient documentation

## 2014-02-10 DIAGNOSIS — Z880 Allergy status to penicillin: Secondary | ICD-10-CM | POA: Insufficient documentation

## 2014-02-10 HISTORY — DX: Gastro-esophageal reflux disease without esophagitis: K21.9

## 2014-02-10 LAB — TROPONIN I

## 2014-02-10 NOTE — ED Notes (Signed)
Patient has been experiencing mid chest discomfort since the middle of the week and takes medicine for acid reflux.  Takes aciphex that usually relieves pain, but this pain has continued. Went to urgent care and sent her after having an EKG.

## 2014-02-10 NOTE — Discharge Instructions (Signed)
Your caregiver has diagnosed you as having chest pain that is not specific for one problem, but does not require admission.  You are at low risk for an acute heart condition or other serious illness. Chest pain comes from many different causes.  SEEK IMMEDIATE MEDICAL ATTENTION IF: You have severe chest pain, especially if the pain is crushing or pressure-like and spreads to the arms, back, neck, or jaw, or if you have sweating, nausea (feeling sick to your stomach), or shortness of breath. THIS IS AN EMERGENCY. Don't wait to see if the pain will go away. Get medical help at once. Call 911 or 0 (operator). DO NOT drive yourself to the hospital.  Your chest pain gets worse and does not go away with rest.  You have an attack of chest pain lasting longer than usual, despite rest and treatment with the medications your caregiver has prescribed.  You wake from sleep with chest pain or shortness of breath.  You feel dizzy or faint.  You have chest pain not typical of your usual pain for which you originally saw your caregiver.  Soft diet until recheck.

## 2014-02-10 NOTE — ED Provider Notes (Signed)
CSN: [id number]     Arrival date & time 02/10/14  1525 History  This chart was scribed for Martha Relic, MD by Marcha Dutton, ED Scribe. This patient was seen in room MH04/MH04 and the patient's care was started at 4:12 PM.    Chief Complaint  Patient presents with  . Chest Pain    The history is provided by the patient. No language interpreter was used.    HPI Comments: Martha Mcbride is a 80 y.o. female who presents to the Emergency Department complaining of CP that began about 3 days ago. Pt presented to the urgent care with this complaint and after having an abnormal EKG, she was directed to the ED. Pt reports at baseline she is healthy apart from polymyalgia and chronic acid reflux. She reports a h/o breast cancer almost 15 years ago. Pt states her reflux occurs maybe once a week. She typically controls her diet with positive results.  Pt had an appointment with her gastroenterologist 2 days ago for a routine appointment and has taken aciphex with her meals to treat her acid reflux. Pt had some discomfort after eating tacos two days ago and 2 or so hours of discomfort last night after eating pizza. She states today after eating coffee and eggs for breakfast around 8 AM, she had the pain for several hours. She is presently not feeling the pain and hasn't eaten anything since then. Pt states her pain is a non-radiating, mild, soreness without any associated symptoms such as nausea or vomiting. Her pain is worse with food, swallowing, and laying down and better with antacids. Her pain is nonexertional and nonpleuritic.   Past Medical History  Diagnosis Date  . Arthritis   . Polymyalgia   . Acid reflux   . Immune deficiency disorder     Past Surgical History  Procedure Laterality Date  . Breast lumpectomy      No family history on file. History  Substance Use Topics  . Smoking status: Never Smoker   . Smokeless tobacco: Not on file  . Alcohol Use: No    Review of  Systems 10 Systems reviewed and all are negative for acute change except as noted in the HPI.   Allergies  Doxycycline; Penicillins; and Sulfa antibiotics   Home Medications   Prior to Admission medications   Medication Sig Start Date End Date Taking? Authorizing Provider  antiseptic oral rinse (BIOTENE) LIQD 15 mLs by Mouth Rinse route as needed for dry mouth.   Yes Historical Provider, MD  FOLIC ACID PO Take by mouth.   Yes Historical Provider, MD  LEFLUNOMIDE PO Take by mouth.   Yes Historical Provider, MD  predniSONE (DELTASONE) 5 MG tablet Take 5 mg by mouth daily with breakfast.   Yes Historical Provider, MD  Calcium-Vitamin D-Vitamin K (VIACTIV PO) Take 1 tablet by mouth daily.    Historical Provider, MD  PRESCRIPTION MEDICATION Take 15 mg by mouth daily. Prednisone.    Historical Provider, MD  RABEprazole (ACIPHEX) 20 MG tablet Take 20 mg by mouth daily.    Historical Provider, MD    Triage Vitals: BP 158/67  Pulse 79  Temp(Src) 98.2 F (36.8 C) (Oral)  SpO2 97%   Physical Exam  Nursing note and vitals reviewed. Constitutional: She is oriented to person, place, and time. She appears well-developed and well-nourished. No distress.  Awake, alert, nontoxic appearance.  HENT:  Head: Normocephalic and atraumatic.  Eyes: EOM are normal. Pupils are equal, round, and  reactive to light. Right eye exhibits no discharge. Left eye exhibits no discharge.  Neck: Neck supple.  Cardiovascular: Normal rate, regular rhythm and normal heart sounds.  Exam reveals no gallop and no friction rub.   No murmur heard. Pulmonary/Chest: Effort normal and breath sounds normal. No respiratory distress. She has no wheezes. She has no rales. She exhibits no tenderness.  Abdominal: Soft. Bowel sounds are normal. There is no tenderness. There is no rebound.  Musculoskeletal: She exhibits no edema and no tenderness.  Baseline ROM, no obvious new focal weakness.  Neurological: She is alert and oriented  to person, place, and time.  Mental status and motor strength appears baseline for patient and situation.  Skin: Skin is warm and dry. No rash noted. She is not diaphoretic.  Psychiatric: She has a normal mood and affect. Her behavior is normal.    ED Course  Procedures (including critical care time)   DIAGNOSTIC STUDIES: Oxygen Saturation is 97% on RA, normal by my interpretation.     COORDINATION OF CARE: 4:22 PM- Patient / Family / Caregiver understand and agree with initial ED impression and plan with expectations set for ED visit. Verified EKG unchanged since 04/2012.   6:39 PM - Pt feels improved after observation and/or treatment in ED.    Labs Review Labs Reviewed  TROPONIN I    Imaging Review Dg Chest 2 View  02/10/2014   CLINICAL DATA:  Chest pain  EXAM: CHEST  2 VIEW  COMPARISON:  DG CHEST 1V PORT dated 02/10/2014; DG CHEST 2V dated 11/02/2011  FINDINGS: Heart size is mildly enlarged. Chronically prominent interstitial markings are noted without focal pulmonary opacity. Clips project over the right lower chest. No pleural effusion. No acute osseous abnormality. The patient is kyphotic with subjective osteopenia. The previously questioned left hilar mass is not reproduced, likely projectional.  IMPRESSION: Cardiomegaly with prominent interstitial markings but no focal acute finding.   Electronically Signed   By: Conchita Paris M.D.   On: 02/10/2014 18:32   Dg Chest Port 1 View  02/10/2014   CLINICAL DATA:  Chest pain.  EXAM: PORTABLE CHEST - 1 VIEW  COMPARISON:  DG CHEST 2V dated 11/02/2011; DG CHEST 2V dated 10/16/2011  FINDINGS: Patient is rotated to the left, limiting evaluation of the mediastinum. Masslike density within the left hilar region. Re- demonstrated bilateral coarse interstitial pulmonary opacities. Heart is stable in size. No large consolidative pulmonary opacities. No pleural effusion or pneumothorax.  IMPRESSION: Masslike density within the left hilar region is  likely secondary to pulmonary structures given patient rotation however underlying true mass is not excluded. Recommend correlation with repeat PA and lateral chest radiograph with patient in true AP positioning. If this is unable to be performed, correlation with CT can be obtained.  No acute cardiopulmonary process.   Electronically Signed   By: Lovey Newcomer M.D.   On: 02/10/2014 16:21      EKG Interpretation   Date/Time:  Saturday Feb 10 2014 16:13:01 EDT Ventricular Rate:  75 PR Interval:  168 QRS Duration: 88 QT Interval:  398 QTC Calculation: 444 R Axis:   -48 Text Interpretation:  Normal sinus rhythm Possible Left atrial enlargement  Left anterior fascicular block Left ventricular hypertrophy Anteroseptal  infarct , age undetermined No significant change since last tracing  Confirmed by [facility]  MD, Jenny Reichmann ([id number]) on 02/10/2014 4:24:45 PM      MDM  6:41 PM - Patient and Family informed of clinical course, understand medical decision-making  process, and agree with plan.  Final diagnoses:  Chest pain  Dysphasia    I doubt any other EMC precluding discharge at this time including, but not necessarily limited to the following:AMI.  I personally performed the services described in this documentation, which was scribed in my presence. The recorded information has been reviewed and is accurate.    Martha Relic, MD 02/12/14 [phone]

## 2014-06-02 ENCOUNTER — Observation Stay (HOSPITAL_COMMUNITY)
Admission: EM | Admit: 2014-06-02 | Discharge: 2014-06-04 | Disposition: A | Discharge status: Home / Self Care | Payer: Medicare Other | Attending: Internal Medicine | Admitting: Internal Medicine

## 2014-06-02 ENCOUNTER — Emergency Department (HOSPITAL_COMMUNITY): Payer: Medicare Other

## 2014-06-02 ENCOUNTER — Encounter (HOSPITAL_COMMUNITY): Payer: Self-pay | Admitting: Emergency Medicine

## 2014-06-02 DIAGNOSIS — R079 Chest pain, unspecified: Principal | ICD-10-CM | POA: Diagnosis present

## 2014-06-02 DIAGNOSIS — M129 Arthropathy, unspecified: Secondary | ICD-10-CM | POA: Diagnosis not present

## 2014-06-02 DIAGNOSIS — Z79899 Other long term (current) drug therapy: Secondary | ICD-10-CM | POA: Insufficient documentation

## 2014-06-02 DIAGNOSIS — E871 Hypo-osmolality and hyponatremia: Secondary | ICD-10-CM | POA: Diagnosis present

## 2014-06-02 DIAGNOSIS — R011 Cardiac murmur, unspecified: Secondary | ICD-10-CM | POA: Diagnosis not present

## 2014-06-02 DIAGNOSIS — D899 Disorder involving the immune mechanism, unspecified: Secondary | ICD-10-CM | POA: Diagnosis not present

## 2014-06-02 DIAGNOSIS — Z880 Allergy status to penicillin: Secondary | ICD-10-CM | POA: Diagnosis not present

## 2014-06-02 DIAGNOSIS — K219 Gastro-esophageal reflux disease without esophagitis: Secondary | ICD-10-CM | POA: Diagnosis not present

## 2014-06-02 DIAGNOSIS — I350 Nonrheumatic aortic (valve) stenosis: Secondary | ICD-10-CM

## 2014-06-02 DIAGNOSIS — I359 Nonrheumatic aortic valve disorder, unspecified: Secondary | ICD-10-CM | POA: Diagnosis not present

## 2014-06-02 DIAGNOSIS — IMO0002 Reserved for concepts with insufficient information to code with codable children: Secondary | ICD-10-CM | POA: Insufficient documentation

## 2014-06-02 HISTORY — DX: Malignant neoplasm of unspecified site of unspecified female breast: C50.919

## 2014-06-02 LAB — I-STAT TROPONIN, ED: Troponin i, poc: 0 ng/mL (ref 0.00–0.08)

## 2014-06-02 LAB — URINALYSIS, ROUTINE W REFLEX MICROSCOPIC
BILIRUBIN URINE: NEGATIVE
Glucose, UA: NEGATIVE mg/dL
KETONES UR: NEGATIVE mg/dL
Leukocytes, UA: NEGATIVE
Nitrite: NEGATIVE
Protein, ur: NEGATIVE mg/dL
SPECIFIC GRAVITY, URINE: 1.007 (ref 1.005–1.030)
UROBILINOGEN UA: 0.2 mg/dL (ref 0.0–1.0)
pH: 6.5 (ref 5.0–8.0)

## 2014-06-02 LAB — BASIC METABOLIC PANEL
ANION GAP: 15 (ref 5–15)
BUN: 12 mg/dL (ref 6–23)
CO2: 21 mEq/L (ref 19–32)
Calcium: 8.4 mg/dL (ref 8.4–10.5)
Chloride: 95 mEq/L — ABNORMAL LOW (ref 96–112)
Creatinine, Ser: 0.65 mg/dL (ref 0.50–1.10)
GFR, EST AFRICAN AMERICAN: 90 mL/min — AB (ref >90)
GFR, EST NON AFRICAN AMERICAN: 78 mL/min — AB (ref >90)
Glucose, Bld: 96 mg/dL (ref 70–99)
Potassium: 3.5 mEq/L — ABNORMAL LOW (ref 3.7–5.3)
SODIUM: 131 meq/L — AB (ref 137–147)

## 2014-06-02 LAB — CBC
HCT: 35.1 % — ABNORMAL LOW (ref 36.0–46.0)
Hemoglobin: 12.1 g/dL (ref 12.0–15.0)
MCH: 29.4 pg (ref 26.0–34.0)
MCHC: 34.5 g/dL (ref 30.0–36.0)
MCV: 85.4 fL (ref 78.0–100.0)
PLATELETS: 229 10*3/uL (ref 150–400)
RBC: 4.11 MIL/uL (ref 3.87–5.11)
RDW: 14 % (ref 11.5–15.5)
WBC: 6.8 10*3/uL (ref 4.0–10.5)

## 2014-06-02 LAB — URINE MICROSCOPIC-ADD ON

## 2014-06-02 MED ORDER — SODIUM CHLORIDE 0.9 % IJ SOLN: 3.0000 mL | Freq: Two times a day (BID) | INTRAMUSCULAR | Status: DC

## 2014-06-02 MED ORDER — PANTOPRAZOLE SODIUM 40 MG PO TBEC
40.0000 mg | DELAYED_RELEASE_TABLET | Freq: Every day | ORAL | Status: DC
  Administered 2014-06-03 – 2014-06-04 (×2): 40 mg via ORAL
  Filled 2014-06-02 (×2): qty 1

## 2014-06-02 MED ORDER — HYDROCODONE-ACETAMINOPHEN 5-325 MG PO TABS: 1.0000 | ORAL_TABLET | ORAL | Status: DC | PRN

## 2014-06-02 MED ORDER — ASPIRIN EC 81 MG PO TBEC
81.0000 mg | DELAYED_RELEASE_TABLET | Freq: Every day | ORAL | Status: DC
  Administered 2014-06-03 – 2014-06-04 (×2): 81 mg via ORAL
  Filled 2014-06-02 (×2): qty 1

## 2014-06-02 MED ORDER — ZOLPIDEM TARTRATE 5 MG PO TABS: 5.0000 mg | ORAL_TABLET | Freq: Every evening | ORAL | Status: DC | PRN

## 2014-06-02 MED ORDER — ACETAMINOPHEN 650 MG RE SUPP: 650.0000 mg | Freq: Four times a day (QID) | RECTAL | Status: DC | PRN

## 2014-06-02 MED ORDER — POTASSIUM CHLORIDE CRYS ER 20 MEQ PO TBCR
20.0000 meq | EXTENDED_RELEASE_TABLET | Freq: Once | ORAL | Status: AC
  Administered 2014-06-03: 20 meq via ORAL
  Filled 2014-06-02: qty 1

## 2014-06-02 MED ORDER — ACETAMINOPHEN 325 MG PO TABS: 650.0000 mg | ORAL_TABLET | Freq: Four times a day (QID) | ORAL | Status: DC | PRN

## 2014-06-02 MED ORDER — SODIUM CHLORIDE 0.9 % IJ SOLN: 3.0000 mL | INTRAMUSCULAR | Status: DC | PRN

## 2014-06-02 MED ORDER — POLYVINYL ALCOHOL 1.4 % OP SOLN
1.0000 [drp] | Freq: Every day | OPHTHALMIC | Status: DC
  Administered 2014-06-03: 1 [drp] via OPHTHALMIC
  Filled 2014-06-02: qty 15

## 2014-06-02 MED ORDER — HYPROMELLOSE (GONIOSCOPIC) 2.5 % OP SOLN
1.0000 [drp] | Freq: Every morning | OPHTHALMIC | Status: DC
  Filled 2014-06-02: qty 15

## 2014-06-02 MED ORDER — ENOXAPARIN SODIUM 40 MG/0.4ML ~~LOC~~ SOLN
40.0000 mg | Freq: Every day | SUBCUTANEOUS | Status: DC
  Filled 2014-06-02 (×2): qty 0.4

## 2014-06-02 MED ORDER — ONDANSETRON HCL 4 MG/2ML IJ SOLN: 4.0000 mg | Freq: Four times a day (QID) | INTRAMUSCULAR | Status: DC | PRN

## 2014-06-02 MED ORDER — SODIUM CHLORIDE 0.9 % IV SOLN: 250.0000 mL | INTRAVENOUS | Status: DC | PRN

## 2014-06-02 MED ORDER — SODIUM CHLORIDE 0.9 % IJ SOLN
3.0000 mL | Freq: Two times a day (BID) | INTRAMUSCULAR | Status: DC
  Administered 2014-06-03: 3 mL via INTRAVENOUS

## 2014-06-02 MED ORDER — PREDNISONE 5 MG PO TABS
5.0000 mg | ORAL_TABLET | Freq: Every day | ORAL | Status: DC
  Administered 2014-06-03 – 2014-06-04 (×2): 5 mg via ORAL
  Filled 2014-06-02 (×3): qty 1

## 2014-06-02 MED ORDER — ASPIRIN EC 325 MG PO TBEC
325.0000 mg | DELAYED_RELEASE_TABLET | Freq: Once | ORAL | Status: AC
  Administered 2014-06-02: 325 mg via ORAL
  Filled 2014-06-02: qty 1

## 2014-06-02 MED ORDER — ONDANSETRON HCL 4 MG PO TABS: 4.0000 mg | ORAL_TABLET | Freq: Four times a day (QID) | ORAL | Status: DC | PRN

## 2014-06-02 MED ORDER — SODIUM CHLORIDE 0.9 % IV SOLN
INTRAVENOUS | Status: DC
  Administered 2014-06-03: 1000 mL via INTRAVENOUS
  Administered 2014-06-03: 01:00:00 via INTRAVENOUS

## 2014-06-02 NOTE — ED Provider Notes (Signed)
CSN: [id number]     Arrival date & time 06/02/14  1857 History   First MD Initiated Contact with Patient 06/02/14 1929     Chief Complaint  Patient presents with  . Chest Pain     (Consider location/radiation/quality/duration/timing/severity/associated sxs/prior Treatment) HPI Martha Mcbride 80 y.o. with a history of polymyalgia presents with chest pain. History is limited as the patient is a poor historian. The patient's daughter is with the patient. This afternoon the patient was vacuuming at her daughter's house and appears diaphoretic, "flushed", and dyspneic. This resolved with rest. At that time she was c/o left sided chest pressure. No known exacerbating or relieving factors. It is minimal in severity and is unchanged from when it started. It is non radiating. No N/V/D, SOB, abdominal pain, lightheadedness, headache, or syncope.   Past Medical History  Diagnosis Date  . Arthritis   . Polymyalgia   . Acid reflux   . Immune deficiency disorder    Past Surgical History  Procedure Laterality Date  . Breast lumpectomy     History reviewed. No pertinent family history. History  Substance Use Topics  . Smoking status: Never Smoker   . Smokeless tobacco: Not on file  . Alcohol Use: No   OB History   Grav Para Term Preterm Abortions TAB SAB Ect Mult Living                 Review of Systems  All other systems reviewed and are negative.     Allergies  Doxycycline; Penicillins; and Sulfa antibiotics  Home Medications   Prior to Admission medications   Medication Sig Start Date End Date Taking? Authorizing Provider  antiseptic oral rinse (BIOTENE) LIQD 15 mLs by Mouth Rinse route as needed for dry mouth.   Yes Historical Provider, MD  donepezil (ARICEPT) 10 MG tablet Take 10 mg by mouth at bedtime.   Yes Historical Provider, MD  folic acid (FOLVITE) 1 MG tablet Take 1 mg by mouth daily.   Yes Historical Provider, MD  hydroxypropyl methylcellulose / hypromellose (ISOPTO  TEARS / GONIOVISC) 2.5 % ophthalmic solution Place 1 drop into both eyes every morning.   Yes Historical Provider, MD  ibuprofen (ADVIL,MOTRIN) 200 MG tablet Take 200 mg by mouth every 6 (six) hours as needed for moderate pain.   Yes Historical Provider, MD  leflunomide (ARAVA) 20 MG tablet Take 20 mg by mouth daily.   Yes Historical Provider, MD  OVER THE COUNTER MEDICATION Take 1 tablet by mouth daily. Medication: Vitron-C High potency Iron   Yes Historical Provider, MD  predniSONE (DELTASONE) 5 MG tablet Take 5 mg by mouth daily with breakfast.   Yes Historical Provider, MD  RABEprazole (ACIPHEX) 20 MG tablet Take 20 mg by mouth daily.   Yes Historical Provider, MD   BP 144/69  Pulse 84  Temp(Src) 98.5 F (36.9 C) (Oral)  Resp 28  SpO2 92% Physical Exam  Constitutional: She is oriented to person, place, and time. She appears well-developed and well-nourished. No distress.  elderly  HENT:  Head: Normocephalic and atraumatic.  Right Ear: External ear normal.  Left Ear: External ear normal.  Eyes: Conjunctivae and EOM are normal. Right eye exhibits no discharge. Left eye exhibits no discharge.  Neck: Normal range of motion. Neck supple. No JVD present.  Cardiovascular: Normal rate and regular rhythm.  Exam reveals no gallop and no friction rub.   Murmur (right upper sternal boarder that radiates to bilateral carotids) heard.  Systolic murmur  is present with a grade of 4/6  Pulmonary/Chest: Effort normal and breath sounds normal. No stridor. No respiratory distress. She has no wheezes. She has no rales. She exhibits no tenderness.  Abdominal: Soft. Bowel sounds are normal. She exhibits no distension. There is no tenderness. There is no rebound and no guarding.  Musculoskeletal: Normal range of motion. She exhibits no edema.  Neurological: She is alert and oriented to person, place, and time.  Skin: Skin is warm. No rash noted. She is not diaphoretic.  Psychiatric: She has a normal mood  and affect. Her behavior is normal.    ED Course  Procedures (including critical care time) Labs Review Labs Reviewed  CBC - Abnormal; Notable for the following:    HCT 35.1 (*)    All other components within normal limits  BASIC METABOLIC PANEL - Abnormal; Notable for the following:    Sodium 131 (*)    Potassium 3.5 (*)    Chloride 95 (*)    GFR calc non Af Amer 78 (*)    GFR calc Af Amer 90 (*)    All other components within normal limits  PRO B NATRIURETIC PEPTIDE  URINALYSIS, ROUTINE W REFLEX MICROSCOPIC  I-STAT TROPOININ, ED    Imaging Review No results found.   EKG Interpretation   Date/Time:  Saturday June 02 2014 19:01:19 EDT Ventricular Rate:  87 PR Interval:  164 QRS Duration: 86 QT Interval:  362 QTC Calculation: 435 R Axis:   -56 Text Interpretation:  Normal sinus rhythm Left anterior fascicular block  Left ventricular hypertrophy with repolarization abnormality Cannot rule  out Inferior infarct (masked by fascicular block?) , age undetermined  Anteroseptal infarct , age undetermined No significant change since last  tracing Confirmed by [facility]  MD, Jenny Reichmann ([id number]) on 06/02/2014 8:30:45 PM      MDM   Final diagnoses:  None    Pt presents with symptoms concerning for cardiac chest pain vs symptomatic aortic stenosis. Will need to cycle cardiac enzymes, and likely will need an echo. Admitted to the hospitalist without any events. Care discussed with Dr. Stevie Kern.     Kelby Aline, MD 06/03/14 [phone]

## 2014-06-02 NOTE — ED Provider Notes (Signed)
I saw and evaluated the patient, reviewed the resident's note and I agree with the findings and plan.   EKG Interpretation   Date/Time:  Saturday June 02 2014 19:01:19 EDT Ventricular Rate:  87 PR Interval:  164 QRS Duration: 86 QT Interval:  362 QTC Calculation: 435 R Axis:   -56 Text Interpretation:  Normal sinus rhythm Left anterior fascicular block  Left ventricular hypertrophy with repolarization abnormality Cannot rule  out Inferior infarct (masked by fascicular block?) , age undetermined  Anteroseptal infarct , age undetermined No significant change since last  tracing Confirmed by [facility]  MD, Jenny Reichmann ([id number]) on 06/02/2014 8:30:45 PM     Transient chest pain episode now resolved uncertain duration with heart murmur not previously known to patient and her family with patient with baseline poor memory  Babette Relic, MD 06/03/14 1227

## 2014-06-02 NOTE — H&P (Addendum)
[facility]                                                                                    Patient Demographics  Martha Mcbride, is a 79 y.o. female  CSN: [id number]  MRN: [id number]  DOB - 01-18-1925  Admit Date - 06/02/2014  Outpatient Primary MD for the patient is No primary provider on file.   With History of -  Past Medical History  Diagnosis Date  . Arthritis   . Polymyalgia   . Acid reflux   . Immune deficiency disorder       Past Surgical History  Procedure Laterality Date  . Breast lumpectomy      in for   Chief Complaint  Patient presents with  . Chest Pain     HPI  Martha Mcbride  is a 80 y.o. female, with past medical history significant for arthritis, acid reflux and an immune deficiency disorder presenting today with chest pain 5/10 nonradiating not associated with shortness of breath nausea or cold sweats. The patient was vacuuming the carpet in the house and went to sit down with her daughter on the porch when the chest pain started. The chest pain improved on its own and now it's still present but much less. The pain is described as dull. No history of coronary artery disease, hypertension or diabetes mellitus    Review of Systems    In addition to the HPI above, No Fever-chills, No Headache, No changes with Vision or hearing, No problems swallowing food or Liquids, No Abdominal pain, No Nausea or Vommitting, Bowel movements are regular, No Blood in stool or Urine, No dysuria, No new skin rashes or bruises, No new joints pains-aches,  No new weakness, tingling, numbness in any extremity, No recent weight gain or loss, No polyuria, polydypsia or polyphagia, No significant Mental Stressors.  A full 10 point Review of Systems was done, except as stated above, all other Review of Systems were negative.   Social History History  Substance Use Topics  . Smoking status: Never Smoker   . Smokeless tobacco: Not on file  . Alcohol  Use: No     Family History Unable to obtain  Prior to Admission medications   Medication Sig Start Date End Date Taking? Authorizing Provider  antiseptic oral rinse (BIOTENE) LIQD 15 mLs by Mouth Rinse route as needed for dry mouth.   Yes Historical Provider, MD  donepezil (ARICEPT) 10 MG tablet Take 10 mg by mouth at bedtime.   Yes Historical Provider, MD  folic acid (FOLVITE) 1 MG tablet Take 1 mg by mouth daily.   Yes Historical Provider, MD  hydroxypropyl methylcellulose / hypromellose (ISOPTO TEARS / GONIOVISC) 2.5 % ophthalmic solution Place 1 drop into both eyes every morning.   Yes Historical Provider, MD  ibuprofen (ADVIL,MOTRIN) 200 MG tablet Take 200 mg by mouth every 6 (six) hours as needed for moderate pain.   Yes Historical Provider, MD  leflunomide (ARAVA) 20 MG tablet Take 20 mg by mouth daily.   Yes Historical Provider, MD  OVER THE COUNTER MEDICATION Take 1 tablet by mouth daily. Medication: Vitron-C High potency Iron  Yes Historical Provider, MD  predniSONE (DELTASONE) 5 MG tablet Take 5 mg by mouth daily with breakfast.   Yes Historical Provider, MD  RABEprazole (ACIPHEX) 20 MG tablet Take 20 mg by mouth daily.   Yes Historical Provider, MD    Allergies  Allergen Reactions  . Doxycycline Nausea And Vomiting  . Penicillins   . Sulfa Antibiotics Nausea And Vomiting    Physical Exam  Vitals  Blood pressure 144/69, pulse 84, temperature 98.5 F (36.9 C), temperature source Oral, resp. rate 28, SpO2 92.00%.   1. General white American female, very pleasant looks younger than age  33. pleasantly confused.  3. No F.N deficits grossly, ALL C.Nerves Intact,   4. Ears and Eyes appear Normal, Conjunctivae clear, PERRLA. Moist Oral Mucosa.  5. Supple Neck, No JVD, No cervical lymphadenopathy appriciated, No Carotid Bruits.  6. Symmetrical Chest wall movement, Good air movement bilaterally, CTAB.  7. RRR, No Gallops, systolic murmur 3/6 radiating to the  neck  8. Positive Bowel Sounds, Abdomen Soft, Non tender, No organomegaly appriciated,No rebound -guarding or rigidity.  9.  No Cyanosis, Normal Skin Turgor, No Skin Rash or Bruise.  10. Good muscle tone,  joints appear normal , no effusions,   11. No Palpable Lymph Nodes in Neck or Axillae    Data Review  CBC  Recent Labs Lab 06/02/14 1956  WBC 6.8  HGB 12.1  HCT 35.1*  PLT 229  MCV 85.4  MCH 29.4  MCHC 34.5  RDW 14.0   ------------------------------------------------------------------------------------------------------------------  Chemistries   Recent Labs Lab 06/02/14 1956  NA 131*  K 3.5*  CL 95*  CO2 21  GLUCOSE 96  BUN 12  CREATININE 0.65  CALCIUM 8.4   ------------------------------------------------------------------------------------------------------------------ CrCl is unknown because there is no height on file for the current visit. ------------------------------------------------------------------------------------------------------------------ No results found for this basename: TSH, T4TOTAL, FREET3, T3FREE, THYROIDAB,  in the last 72 hours   Coagulation profile No results found for this basename: INR, PROTIME,  in the last 168 hours ------------------------------------------------------------------------------------------------------------------- No results found for this basename: DDIMER,  in the last 72 hours -------------------------------------------------------------------------------------------------------------------  Cardiac Enzymes No results found for this basename: CK, CKMB, TROPONINI, MYOGLOBIN,  in the last 168 hours ------------------------------------------------------------------------------------------------------------------ No components found with this basename: POCBNP,    ---------------------------------------------------------------------------------------------------------------  Urinalysis    Component Value  Date/Time   COLORURINE YELLOW 05/21/2012 [place] 05/21/2012 1014   LABSPEC 1.021 05/21/2012 1014   PHURINE 6.0 05/21/2012 [place] 05/21/2012 1014   HGBUR SMALL* 05/21/2012 [place] 05/21/2012 [place] 05/21/2012 1014   PROTEINUR 30* 05/21/2012 1014   UROBILINOGEN 0.2 05/21/2012 1014   NITRITE NEGATIVE 05/21/2012 [place] 05/21/2012 1014    ----------------------------------------------------------------------------------------------------------------    Imaging results:   Dg Chest 2 View  06/02/2014   CLINICAL DATA:  Left-sided chest pain.  Breast carcinoma.  EXAM: CHEST  2 VIEW  COMPARISON:  02/10/2014  FINDINGS: Mild cardiomegaly is stable. Changes of COPD again demonstrated. Chronic pulmonary interstitial prominence again seen without evidence of acute infiltrate or edema. No evidence of pleural effusion. Moderate hiatal hernia again noted.  IMPRESSION: Stable cardiomegaly and COPD.  No active lung disease.  Moderate hiatal hernia.   Electronically Signed   By: Earle Gell M.D.   On: 06/02/2014 21:16    My personal review of EKG: Rhythm NSR, Rate  87/min, LVH and left anterior fascicular block with old anteroseptal MI  Assessment & Plan   1. chest pain; improving on its own 2. Heart murmur 3/6 systolic 3. Immune disorder on arava and prednisone 4. Hyponatremia 5. Hypokalemia; replete  Plan  Admit to telemetry Serial troponins Echocardiogram Hold Arava and continue prednisone IV fluids normal saline Awaiting call from daughter  DVT Prophylaxis Lovenox  AM Labs Ordered, also please review Full Orders  Code Status full  Disposition Plan: Home  Time spent in minutes : 32 minutes  Condition GUARDED   @SIGNATURE @

## 2014-06-02 NOTE — ED Notes (Signed)
Pt c/o "feeling weak all day." this afternoon while vacuuming her house she states she bent over and got dizzy and flushed and my chest has felt uncomfortable since."  She is A&Ox4, breathing easily

## 2014-06-03 ENCOUNTER — Encounter (HOSPITAL_COMMUNITY): Payer: Self-pay | Admitting: Cardiology

## 2014-06-03 DIAGNOSIS — R011 Cardiac murmur, unspecified: Secondary | ICD-10-CM

## 2014-06-03 DIAGNOSIS — I350 Nonrheumatic aortic (valve) stenosis: Secondary | ICD-10-CM

## 2014-06-03 DIAGNOSIS — I359 Nonrheumatic aortic valve disorder, unspecified: Secondary | ICD-10-CM

## 2014-06-03 LAB — BASIC METABOLIC PANEL
Anion gap: 13 (ref 5–15)
BUN: 13 mg/dL (ref 6–23)
CHLORIDE: 100 meq/L (ref 96–112)
CO2: 23 mEq/L (ref 19–32)
CREATININE: 0.73 mg/dL (ref 0.50–1.10)
Calcium: 8.7 mg/dL (ref 8.4–10.5)
GFR calc Af Amer: 87 mL/min — ABNORMAL LOW (ref >90)
GFR, EST NON AFRICAN AMERICAN: 75 mL/min — AB (ref >90)
GLUCOSE: 144 mg/dL — AB (ref 70–99)
POTASSIUM: 3.2 meq/L — AB (ref 3.7–5.3)
Sodium: 136 mEq/L — ABNORMAL LOW (ref 137–147)

## 2014-06-03 LAB — TROPONIN I
Troponin I: <0.3 ng/mL (ref <0.30)
Troponin I: <0.3 ng/mL (ref <0.30)
Troponin I: <0.3 ng/mL (ref <0.30)

## 2014-06-03 LAB — PRO B NATRIURETIC PEPTIDE: Pro B Natriuretic peptide (BNP): 622.2 pg/mL — ABNORMAL HIGH (ref 0–450)

## 2014-06-03 LAB — CBC
HCT: 36.8 % (ref 36.0–46.0)
HEMOGLOBIN: 12.2 g/dL (ref 12.0–15.0)
MCH: 29.4 pg (ref 26.0–34.0)
MCHC: 33.2 g/dL (ref 30.0–36.0)
MCV: 88.7 fL (ref 78.0–100.0)
Platelets: 227 10*3/uL (ref 150–400)
RBC: 4.15 MIL/uL (ref 3.87–5.11)
RDW: 14.4 % (ref 11.5–15.5)
WBC: 5 10*3/uL (ref 4.0–10.5)

## 2014-06-03 MED ORDER — METOPROLOL TARTRATE 12.5 MG HALF TABLET
12.5000 mg | ORAL_TABLET | Freq: Two times a day (BID) | ORAL | Status: DC
  Administered 2014-06-03 – 2014-06-04 (×3): 12.5 mg via ORAL
  Filled 2014-06-03 (×4): qty 1

## 2014-06-03 MED ORDER — HYDRALAZINE HCL 20 MG/ML IJ SOLN
10.0000 mg | Freq: Once | INTRAMUSCULAR | Status: AC
  Administered 2014-06-03: 10 mg via INTRAVENOUS
  Filled 2014-06-03: qty 1

## 2014-06-03 MED ORDER — POTASSIUM CHLORIDE CRYS ER 20 MEQ PO TBCR
40.0000 meq | EXTENDED_RELEASE_TABLET | Freq: Two times a day (BID) | ORAL | Status: AC
  Administered 2014-06-03 (×2): 40 meq via ORAL
  Filled 2014-06-03 (×2): qty 2

## 2014-06-03 NOTE — Consult Note (Addendum)
Primary cardiologist: New  HPI: 80 year old female for evaluation of chest pain. No prior cardiac history. Patient lives alone and does fairly well. She has dyspnea with more vigorous activities but not routine activities. No orthopnea, PND, pedal edema, palpitations, syncope or exertional chest pain. Yesterday while vacuuming she "felt bad". She developed a vague discomfort in her left upper chest. The pain did not radiate. No associated symptoms. Duration approximately 2 hours. Not pleuritic, positional. Resolved spontaneously. She was admitted for rule out and cardiology is asked to evaluate.  Medications Prior to Admission  Medication Sig Dispense Refill  . antiseptic oral rinse (BIOTENE) LIQD 15 mLs by Mouth Rinse route as needed for dry mouth.      . donepezil (ARICEPT) 10 MG tablet Take 10 mg by mouth at bedtime.      . folic acid (FOLVITE) 1 MG tablet Take 1 mg by mouth daily.      . hydroxypropyl methylcellulose / hypromellose (ISOPTO TEARS / GONIOVISC) 2.5 % ophthalmic solution Place 1 drop into both eyes every morning.      Marland Kitchen ibuprofen (ADVIL,MOTRIN) 200 MG tablet Take 200 mg by mouth every 6 (six) hours as needed for moderate pain.      Marland Kitchen leflunomide (ARAVA) 20 MG tablet Take 20 mg by mouth daily.      Marland Kitchen OVER THE COUNTER MEDICATION Take 1 tablet by mouth daily. Medication: Vitron-C High potency Iron      . predniSONE (DELTASONE) 5 MG tablet Take 5 mg by mouth daily with breakfast.      . RABEprazole (ACIPHEX) 20 MG tablet Take 20 mg by mouth daily.        Allergies  Allergen Reactions  . Doxycycline Nausea And Vomiting  . Penicillins   . Sulfa Antibiotics Nausea And Vomiting    Past Medical History  Diagnosis Date  . Arthritis   . Polymyalgia   . Acid reflux   . Immune deficiency disorder     autoimmune disorder - polymyalgia - on prednisone  . Breast cancer     Treated with lumpectomy and radiation    Past Surgical History  Procedure Laterality Date  . Breast  lumpectomy    . Partial hysterectomy    . Tonsillectomy      History   Social History  . Marital Status: Widowed    Spouse Name: N/A    Number of Children: 4  . Years of Education: N/A   Occupational History  . Not on file.   Social History Main Topics  . Smoking status: Never Smoker   . Smokeless tobacco: Not on file  . Alcohol Use: No  . Drug Use: No  . Sexual Activity: Not on file   Other Topics Concern  . Not on file   Social History Narrative  . No narrative on file    Family History  Problem Relation Age of Onset  . Stroke Mother     ROS:  no fevers or chills, productive cough, hemoptysis, dysphasia, odynophagia, melena, hematochezia, dysuria, hematuria, rash, seizure activity, orthopnea, PND, pedal edema, claudication. Remaining systems are negative.  Physical Exam:   Blood pressure 151/74, pulse 89, temperature 100.5 F (38.1 C), temperature source Oral, resp. rate 18, height $RemoveBe'5\' 1"'bWbeHyTUR$  (1.549 m), weight 138 lb 14.2 oz (63 kg), SpO2 95.00%.  General:  Well developed/well nourished in NAD Skin warm/dry Patient not depressed No peripheral clubbing Back-normal HEENT-normal/normal eyelids Neck supple/normal carotid upstroke bilaterally; no JVD; no thyromegaly chest - CTA/ normal  expansion CV - RRR/normal S1 and S2; No rubs or gallops;  PMI nondisplaced, 3/6 systolic murmur left sternal border radiating to the carotids. S2 is not diminished. Abdomen -NT/ND, no HSM, no mass, + bowel sounds, no bruit 2+ femoral pulses, no bruits Ext-no edema, chords, 2+ DP Neuro-grossly nonfocal  ECG Shows sinus rhythm, left anterior fascicular block, septal infarct, left ventricular hypertrophy with repolarization abnormality  Results for orders placed during the hospital encounter of 06/02/14 (from the past 48 hour(s))  PRO B NATRIURETIC PEPTIDE     Status: Abnormal   Collection Time    06/02/14  7:54 PM      Result Value Ref Range   Pro B Natriuretic peptide (BNP) 622.2  (*) 0 - 450 pg/mL  CBC     Status: Abnormal   Collection Time    06/02/14  7:56 PM      Result Value Ref Range   WBC 6.8  4.0 - 10.5 K/uL   RBC 4.11  3.87 - 5.11 MIL/uL   Hemoglobin 12.1  12.0 - 15.0 g/dL   HCT 35.1 (*) 36.0 - 46.0 %   MCV 85.4  78.0 - 100.0 fL   MCH 29.4  26.0 - 34.0 pg   MCHC 34.5  30.0 - 36.0 g/dL   RDW 14.0  11.5 - 15.5 %   Platelets 229  150 - 400 K/uL  BASIC METABOLIC PANEL     Status: Abnormal   Collection Time    06/02/14  7:56 PM      Result Value Ref Range   Sodium 131 (*) 137 - 147 mEq/L   Potassium 3.5 (*) 3.7 - 5.3 mEq/L   Chloride 95 (*) 96 - 112 mEq/L   CO2 21  19 - 32 mEq/L   Glucose, Bld 96  70 - 99 mg/dL   BUN 12  6 - 23 mg/dL   Creatinine, Ser 0.65  0.50 - 1.10 mg/dL   Calcium 8.4  8.4 - 10.5 mg/dL   GFR calc non Af Amer 78 (*) >90 mL/min   GFR calc Af Amer 90 (*) >90 mL/min   Comment: (NOTE)     The eGFR has been calculated using the CKD EPI equation.     This calculation has not been validated in all clinical situations.     eGFR's persistently <90 mL/min signify possible Chronic Kidney     Disease.   Anion gap 15  5 - 15  I-STAT TROPOININ, ED     Status: None   Collection Time    06/02/14  8:03 PM      Result Value Ref Range   Troponin i, poc 0.00  0.00 - 0.08 ng/mL   Comment 3            Comment: Due to the release kinetics of cTnI,     a negative result within the first hours     of the onset of symptoms does not rule out     myocardial infarction with certainty.     If myocardial infarction is still suspected,     repeat the test at appropriate intervals.  URINALYSIS, ROUTINE W REFLEX MICROSCOPIC     Status: Abnormal   Collection Time    06/02/14  9:30 PM      Result Value Ref Range   Color, Urine YELLOW  YELLOW   APPearance TURBID (*) CLEAR   Specific Gravity, Urine 1.007  1.005 - 1.030   pH 6.5  5.0 - 8.0  Glucose, UA NEGATIVE  NEGATIVE mg/dL   Hgb urine dipstick TRACE (*) NEGATIVE   Bilirubin Urine NEGATIVE   NEGATIVE   Ketones, ur NEGATIVE  NEGATIVE mg/dL   Protein, ur NEGATIVE  NEGATIVE mg/dL   Urobilinogen, UA 0.2  0.0 - 1.0 mg/dL   Nitrite NEGATIVE  NEGATIVE   Leukocytes, UA NEGATIVE  NEGATIVE  URINE MICROSCOPIC-ADD ON     Status: None   Collection Time    06/02/14  9:30 PM      Result Value Ref Range   Squamous Epithelial / LPF RARE  RARE   RBC / HPF 0-2  <3 RBC/hpf   Bacteria, UA RARE  RARE  BASIC METABOLIC PANEL     Status: Abnormal   Collection Time    06/03/14 12:35 AM      Result Value Ref Range   Sodium 136 (*) 137 - 147 mEq/L   Potassium 3.2 (*) 3.7 - 5.3 mEq/L   Chloride 100  96 - 112 mEq/L   CO2 23  19 - 32 mEq/L   Glucose, Bld 144 (*) 70 - 99 mg/dL   BUN 13  6 - 23 mg/dL   Creatinine, Ser 0.73  0.50 - 1.10 mg/dL   Calcium 8.7  8.4 - 10.5 mg/dL   GFR calc non Af Amer 75 (*) >90 mL/min   GFR calc Af Amer 87 (*) >90 mL/min   Comment: (NOTE)     The eGFR has been calculated using the CKD EPI equation.     This calculation has not been validated in all clinical situations.     eGFR's persistently <90 mL/min signify possible Chronic Kidney     Disease.   Anion gap 13  5 - 15  CBC     Status: None   Collection Time    06/03/14 12:35 AM      Result Value Ref Range   WBC 5.0  4.0 - 10.5 K/uL   RBC 4.15  3.87 - 5.11 MIL/uL   Hemoglobin 12.2  12.0 - 15.0 g/dL   HCT 36.8  36.0 - 46.0 %   MCV 88.7  78.0 - 100.0 fL   MCH 29.4  26.0 - 34.0 pg   MCHC 33.2  30.0 - 36.0 g/dL   RDW 14.4  11.5 - 15.5 %   Platelets 227  150 - 400 K/uL  TROPONIN I     Status: None   Collection Time    06/03/14 12:35 AM      Result Value Ref Range   Troponin I <0.30  <0.30 ng/mL   Comment:            Due to the release kinetics of cTnI,     a negative result within the first hours     of the onset of symptoms does not rule out     myocardial infarction with certainty.     If myocardial infarction is still suspected,     repeat the test at appropriate intervals.  TROPONIN I     Status:  None   Collection Time    06/03/14  6:00 AM      Result Value Ref Range   Troponin I <0.30  <0.30 ng/mL   Comment:            Due to the release kinetics of cTnI,     a negative result within the first hours     of the onset of symptoms does not rule out  myocardial infarction with certainty.     If myocardial infarction is still suspected,     repeat the test at appropriate intervals.  TROPONIN I     Status: None   Collection Time    06/03/14 12:05 PM      Result Value Ref Range   Troponin I <0.30  <0.30 ng/mL   Comment:            Due to the release kinetics of cTnI,     a negative result within the first hours     of the onset of symptoms does not rule out     myocardial infarction with certainty.     If myocardial infarction is still suspected,     repeat the test at appropriate intervals.    Dg Chest 2 View  06/02/2014   CLINICAL DATA:  Left-sided chest pain.  Breast carcinoma.  EXAM: CHEST  2 VIEW  COMPARISON:  02/10/2014  FINDINGS: Mild cardiomegaly is stable. Changes of COPD again demonstrated. Chronic pulmonary interstitial prominence again seen without evidence of acute infiltrate or edema. No evidence of pleural effusion. Moderate hiatal hernia again noted.  IMPRESSION: Stable cardiomegaly and COPD.  No active lung disease.  Moderate hiatal hernia.   Electronically Signed   By: Earle Gell M.D.   On: 06/02/2014 21:16    Assessment/Plan 1 chest pain-etiology of symptoms unclear. Enzymes are negative. Will screen for coronary disease with a Lexiscan Myoview tomorrow morning. Continue aspirin. 2 aortic stenosis-patient is noted to have normal LV function on echocardiogram. She has severe left ventricular hypertrophy. There appears to be mild Sam with LVOT obstruction but not well quantified. Her aortic stenosis by mean gradient is moderate. She also appears to have some degree of mitral stenosis from mitral annular calcification. I will add low-dose beta blockade to decrease  heart rate and improve filling time. She will need followup echocardiograms in the future. Given age I would like to be conservative if possible. Long discussion and concerning symptoms to watch for including dyspnea, exertional chest pain and syncope. Patient overall very healthy. 3 mitral stenosis-discussion as above. 4 history of polymyalgia rheumatica-patient is on chronic steroids. Kirk Ruths MD 06/03/2014, 1:19 PM

## 2014-06-03 NOTE — Progress Notes (Signed)
[facility] PROGRESS NOTE Assessment/Plan: Atypical Chest pain: - Reproducible by palpation. - Cardiac markers negative x3. - EKG Normal sinus rhythm Left anterior fascicular block, Left ventricular hypertrophy. - echo pending, loud harsh systolic murmur. - cont ASA.  Hyponatremia - improved with IV fluids most likely due to decrease intravascular vol.  POLYMYALGIA RHEUMATICA - cont Leflunomide.    Family Communication: daughter  Disposition Plan: observation   Consultants:  none  Procedures:  Echo pending  Antibiotics:  none  HPI/Subjective: Pt is pain free pain only with movement of her shoulder.   Objective: Filed Vitals:   06/02/14 2145 06/02/14 2315 06/03/14 0529 06/03/14 0742  BP: 162/77 143/62 192/85 144/59  Pulse: 81 94 96 98  Temp:  98.1 F (36.7 C) 100.1 F (37.8 C)   TempSrc:  Oral Oral   Resp: 17 18 18    Height:  5\' 1"  (1.549 m)    Weight:  63.05 kg (139 lb) 63 kg (138 lb 14.2 oz)   SpO2: 98% 94% 95%     Intake/Output Summary (Last 24 hours) at 06/03/14 0951 Last data filed at 06/03/14 0531  Gross per 24 hour  Intake      3 ml  Output    400 ml  Net   -397 ml   Filed Weights   06/02/14 2315 06/03/14 0529  Weight: 63.05 kg (139 lb) 63 kg (138 lb 14.2 oz)    Exam:  General: Alert, awake, oriented x3, in no acute distress.  HEENT: No bruits, no goiter.  Heart: Regular rate and rhythm, systolic murmur, pain reproducible by palpation. Lungs: Good air movement, clear Abdomen: Soft, nontender, nondistended, positive bowel sounds.    Data Reviewed: Basic Metabolic Panel:  Recent Labs Lab 06/02/14 1956 06/03/14 0035  NA 131* 136*  K 3.5* 3.2*  CL 95* 100  CO2 21 23  GLUCOSE 96 144*  BUN 12 13  CREATININE 0.65 0.73  CALCIUM 8.4 8.7   Liver Function Tests: No results found for this basename: AST, ALT, ALKPHOS, BILITOT, PROT, ALBUMIN,  in the last 168 hours No results found for this basename: LIPASE, AMYLASE,  in  the last 168 hours No results found for this basename: AMMONIA,  in the last 168 hours CBC:  Recent Labs Lab 06/02/14 1956 06/03/14 0035  WBC 6.8 5.0  HGB 12.1 12.2  HCT 35.1* 36.8  MCV 85.4 88.7  PLT 229 227   Cardiac Enzymes:  Recent Labs Lab 06/03/14 0035 06/03/14 0600  TROPONINI <0.30 <0.30   BNP (last 3 results)  Recent Labs  06/02/14 1954  PROBNP 622.2*   CBG: No results found for this basename: GLUCAP,  in the last 168 hours  No results found for this or any previous visit (from the past 240 hour(s)).   Studies: Dg Chest 2 View  06/02/2014   CLINICAL DATA:  Left-sided chest pain.  Breast carcinoma.  EXAM: CHEST  2 VIEW  COMPARISON:  02/10/2014  FINDINGS: Mild cardiomegaly is stable. Changes of COPD again demonstrated. Chronic pulmonary interstitial prominence again seen without evidence of acute infiltrate or edema. No evidence of pleural effusion. Moderate hiatal hernia again noted.  IMPRESSION: Stable cardiomegaly and COPD.  No active lung disease.  Moderate hiatal hernia.   Electronically Signed   By: Earle Gell M.D.   On: 06/02/2014 21:16    Scheduled Meds: . aspirin EC  81 mg Oral Daily  . enoxaparin (LOVENOX) injection  40 mg Subcutaneous Daily  . pantoprazole  40  mg Oral Daily  . polyvinyl alcohol  1 drop Both Eyes Daily  . predniSONE  5 mg Oral Q breakfast  . sodium chloride  3 mL Intravenous Q12H  . sodium chloride  3 mL Intravenous Q12H   Continuous Infusions: . sodium chloride 50 mL/hr at 06/03/14 0031     Charlynne Cousins  [facility] Pager [phone]. If 8PM-8AM, please contact night-coverage at [email or web address], password [facility] 06/03/2014, 9:51 AM  LOS: 1 day    **Disclaimer: This note may have been dictated with voice recognition software. Similar sounding words can inadvertently be transcribed and this note may contain transcription errors which may not have been corrected upon publication of note.**

## 2014-06-03 NOTE — Progress Notes (Signed)
  Echocardiogram 2D Echocardiogram has been performed.  Martha Mcbride 06/03/2014, 11:48 AM

## 2014-06-03 NOTE — Progress Notes (Signed)
Utilization Review Completed.   Merlinda Wrubel, RN, BSN Nurse Case Manager  

## 2014-06-04 ENCOUNTER — Observation Stay (HOSPITAL_COMMUNITY): Payer: Medicare Other

## 2014-06-04 DIAGNOSIS — R079 Chest pain, unspecified: Secondary | ICD-10-CM

## 2014-06-04 MED ORDER — REGADENOSON 0.4 MG/5ML IV SOLN
0.4000 mg | Freq: Once | INTRAVENOUS | Status: AC
  Administered 2014-06-04: 0.4 mg via INTRAVENOUS
  Filled 2014-06-04: qty 5

## 2014-06-04 MED ORDER — REGADENOSON 0.4 MG/5ML IV SOLN
INTRAVENOUS | Status: AC
  Administered 2014-06-04: 0.4 mg via INTRAVENOUS
  Filled 2014-06-04: qty 5

## 2014-06-04 MED ORDER — TECHNETIUM TC 99M SESTAMIBI GENERIC - CARDIOLITE
30.0000 | Freq: Once | INTRAVENOUS | Status: AC | PRN
  Administered 2014-06-04: 30 via INTRAVENOUS

## 2014-06-04 MED ORDER — TECHNETIUM TC 99M SESTAMIBI GENERIC - CARDIOLITE
10.0000 | Freq: Once | INTRAVENOUS | Status: AC | PRN
  Administered 2014-06-04: 10 via INTRAVENOUS

## 2014-06-04 MED ORDER — METOPROLOL TARTRATE 12.5 MG HALF TABLET: 12.5000 mg | ORAL_TABLET | Freq: Two times a day (BID) | ORAL | Status: DC

## 2014-06-04 NOTE — Discharge Instructions (Signed)
Cardiac Diet This diet can help prevent heart disease and stroke. Many factors influence your heart health, including eating and exercise habits. Coronary risk rises a lot with abnormal blood fat (lipid) levels. Cardiac meal planning includes limiting unhealthy fats, increasing healthy fats, and making other small dietary changes. General guidelines are as follows:  Adjust calorie intake to reach and maintain desirable body weight.  Limit total fat intake to less than 30% of total calories. Saturated fat should be less than 7% of calories.  Saturated fats are found in animal products and in some vegetable products. Saturated vegetable fats are found in coconut oil, cocoa butter, palm oil, and palm kernel oil. Read labels carefully to avoid these products as much as possible. Use butter in moderation. Choose tub margarines and oils that have 2 grams of fat or less. Good cooking oils are canola and olive oils.  Practice low-fat cooking techniques. Do not fry food. Instead, broil, bake, boil, steam, grill, roast on a rack, stir-fry, or microwave it. Other fat reducing suggestions include:  Remove the skin from poultry.  Remove all visible fat from meats.  Skim the fat off stews, soups, and gravies before serving them.  Steam vegetables in water or broth instead of sauting them in fat.  Avoid foods with trans fat (or hydrogenated oils), such as commercially fried foods and commercially baked goods. Commercial shortening and deep-frying fats will contain trans fat.  Increase intake of fruits, vegetables, whole grains, and legumes to replace foods high in fat.  Increase consumption of nuts, legumes, and seeds to at least 4 servings weekly. One serving of a legume equals  cup, and 1 serving of nuts or seeds equals  cup.  Choose whole grains more often. Have 3 servings per day (a serving is 1 ounce [oz]).  Eat 4 to 5 servings of vegetables per day. A serving of vegetables is 1 cup of raw leafy  vegetables;  cup of raw or cooked cut-up vegetables;  cup of vegetable juice.  Eat 4 to 5 servings of fruit per day. A serving of fruit is 1 medium whole fruit;  cup of dried fruit;  cup of fresh, frozen, or canned fruit;  cup of 100% fruit juice.  Increase your intake of dietary fiber to 20 to 30 grams per day. Insoluble fiber may help lower your risk of heart disease and may help curb your appetite.  Soluble fiber binds cholesterol to be removed from the blood. Foods high in soluble fiber are dried beans, citrus fruits, oats, apples, bananas, broccoli, Brussels sprouts, and eggplant.  Try to include foods fortified with plant sterols or stanols, such as yogurt, breads, juices, or margarines. Choose several fortified foods to achieve a daily intake of 2 to 3 grams of plant sterols or stanols.  Foods with omega-3 fats can help reduce your risk of heart disease. Aim to have a 3.5 oz portion of fatty fish twice per week, such as salmon, mackerel, albacore tuna, sardines, lake trout, or herring. If you wish to take a fish oil supplement, choose one that contains 1 gram of both DHA and EPA.  Limit processed meats to 2 servings (3 oz portion) weekly.  Limit the sodium in your diet to 1500 milligrams (mg) per day. If you have high blood pressure, talk to a registered dietitian about a DASH (Dietary Approaches to Stop Hypertension) eating plan.  Limit sweets and beverages with added sugar, such as soda, to no more than 5 servings per week. One   serving is:   1 tablespoon sugar.  1 tablespoon jelly or jam.   cup sorbet.  1 cup lemonade.   cup regular soda. CHOOSING FOODS Starches  Allowed: Breads: All kinds (wheat, rye, raisin, white, oatmeal, Italian, French, and English muffin bread). Low-fat rolls: English muffins, frankfurter and hamburger buns, bagels, pita bread, tortillas (not fried). Pancakes, waffles, biscuits, and muffins made with recommended oil.  Avoid: Products made with  saturated or trans fats, oils, or whole milk products. Butter rolls, cheese breads, croissants. Commercial doughnuts, muffins, sweet rolls, biscuits, waffles, pancakes, store-bought mixes. Crackers  Allowed: Low-fat crackers and snacks: Animal, graham, rye, saltine (with recommended oil, no lard), oyster, and matzo crackers. Bread sticks, melba toast, rusks, flatbread, pretzels, and light popcorn.  Avoid: High-fat crackers: cheese crackers, butter crackers, and those made with coconut, palm oil, or trans fat (hydrogenated oils). Buttered popcorn. Cereals  Allowed: Hot or cold whole-grain cereals.  Avoid: Cereals containing coconut, hydrogenated vegetable fat, or animal fat. Potatoes / Pasta / Rice  Allowed: All kinds of potatoes, rice, and pasta (such as macaroni, spaghetti, and noodles).  Avoid: Pasta or rice prepared with cream sauce or high-fat cheese. Chow mein noodles, French fries. Vegetables  Allowed: All vegetables and vegetable juices.  Avoid: Fried vegetables. Vegetables in cream, butter, or high-fat cheese sauces. Limit coconut. Fruit in cream or custard. Protein  Allowed: Limit your intake of meat, seafood, and poultry to no more than 6 oz (cooked weight) per day. All lean, well-trimmed beef, veal, pork, and lamb. All chicken and turkey without skin. All fish and shellfish. Wild game: wild duck, rabbit, pheasant, and venison. Egg whites or low-cholesterol egg substitutes may be used as desired. Meatless dishes: recipes with dried beans, peas, lentils, and tofu (soybean curd). Seeds and nuts: all seeds and most nuts.  Avoid: Prime grade and other heavily marbled and fatty meats, such as short ribs, spare ribs, rib eye roast or steak, frankfurters, sausage, bacon, and high-fat luncheon meats, mutton. Caviar. Commercially fried fish. Domestic duck, goose, venison sausage. Organ meats: liver, gizzard, heart, chitterlings, brains, kidney, sweetbreads. Dairy  Allowed: Low-fat  cheeses: nonfat or low-fat cottage cheese (1% or 2% fat), cheeses made with part skim milk, such as mozzarella, farmers, string, or ricotta. (Cheeses should be labeled no more than 2 to 6 grams fat per oz.). Skim (or 1%) milk: liquid, powdered, or evaporated. Buttermilk made with low-fat milk. Drinks made with skim or low-fat milk or cocoa. Chocolate milk or cocoa made with skim or low-fat (1%) milk. Nonfat or low-fat yogurt.  Avoid: Whole milk cheeses, including colby, cheddar, muenster, Monterey Jack, Havarti, Brie, Camembert, American, Swiss, and blue. Creamed cottage cheese, cream cheese. Whole milk and whole milk products, including buttermilk or yogurt made from whole milk, drinks made from whole milk. Condensed milk, evaporated whole milk, and 2% milk. Soups and Combination Foods  Allowed: Low-fat low-sodium soups: broth, dehydrated soups, homemade broth, soups with the fat removed, homemade cream soups made with skim or low-fat milk. Low-fat spaghetti, lasagna, chili, and Spanish rice if low-fat ingredients and low-fat cooking techniques are used.  Avoid: Cream soups made with whole milk, cream, or high-fat cheese. All other soups. Desserts and Sweets  Allowed: Sherbet, fruit ices, gelatins, meringues, and angel food cake. Homemade desserts with recommended fats, oils, and milk products. Jam, jelly, honey, marmalade, sugars, and syrups. Pure sugar candy, such as gum drops, hard candy, jelly beans, marshmallows, mints, and small amounts of dark chocolate.  Avoid: Commercially prepared   cakes, pies, cookies, frosting, pudding, or mixes for these products. Desserts containing whole milk products, chocolate, coconut, lard, palm oil, or palm kernel oil. Ice cream or ice cream drinks. Candy that contains chocolate, coconut, butter, hydrogenated fat, or unknown ingredients. Buttered syrups. Fats and Oils  Allowed: Vegetable oils: safflower, sunflower, corn, soybean, cottonseed, sesame, canola, olive,  or peanut. Non-hydrogenated margarines. Salad dressing or mayonnaise: homemade or commercial, made with a recommended oil. Low or nonfat salad dressing or mayonnaise.  Limit added fats and oils to 6 to 8 tsp per day (includes fats used in cooking, baking, salads, and spreads on bread). Remember to count the "hidden fats" in foods.  Avoid: Solid fats and shortenings: butter, lard, salt pork, bacon drippings. Gravy containing meat fat, shortening, or suet. Cocoa butter, coconut. Coconut oil, palm oil, palm kernel oil, or hydrogenated oils: these ingredients are often used in bakery products, nondairy creamers, whipped toppings, candy, and commercially fried foods. Read labels carefully. Salad dressings made of unknown oils, sour cream, or cheese, such as blue cheese and Roquefort. Cream, all kinds: half-and-half, light, heavy, or whipping. Sour cream or cream cheese (even if "light" or low-fat). Nondairy cream substitutes: coffee creamers and sour cream substitutes made with palm, palm kernel, hydrogenated oils, or coconut oil. Beverages  Allowed: Coffee (regular or decaffeinated), tea. Diet carbonated beverages, mineral water. Alcohol: Check with your caregiver. Moderation is recommended.  Avoid: Whole milk, regular sodas, and juice drinks with added sugar. Condiments  Allowed: All seasonings and condiments. Cocoa powder. "Cream" sauces made with recommended ingredients.  Avoid: Carob powder made with hydrogenated fats. SAMPLE MENU Breakfast   cup orange juice   cup oatmeal  1 slice toast  1 tsp margarine  1 cup skim milk Lunch  Turkey sandwich with 2 oz turkey, 2 slices bread  Lettuce and tomato slices  Fresh fruit  Carrot sticks  Coffee or tea Snack  Fresh fruit or low-fat crackers Dinner  3 oz lean ground beef  1 baked potato  1 tsp margarine   cup asparagus  Lettuce salad  1 tbs non-creamy dressing   cup peach slices  1 cup skim milk Document Released:  06/23/2008 Document Revised: 03/15/2012 Document Reviewed: 11/14/2013 [employer] Patient Information 2015 [employer]. This information is not intended to replace advice given to you by your health care provider. Make sure you discuss any questions you have with your health care provider.  

## 2014-06-04 NOTE — Progress Notes (Signed)
Patient ID: Martha Mcbride, female   DOB: 1925-06-14, 80 y.o.   MRN: [id number]    Subjective:  Denies SSCP, palpitations or Dyspnea Going for nuclear study  Objective:  Filed Vitals:   06/03/14 1623 06/03/14 1739 06/03/14 2131 06/04/14 0649  BP:  127/51 151/81 179/79  Pulse:  90 71 84  Temp: 98.4 F (36.9 C)  98.5 F (36.9 C) 98.5 F (36.9 C)  TempSrc: Oral  Oral Oral  Resp:   18 18  Height:      Weight:    138 lb 14.2 oz (63 kg)  SpO2:   97% 96%     Physical Exam:  Affect appropriate Healthy:  appears stated age HEENT: normal Neck supple with no adenopathy JVP normal no bruits no thyromegaly Lungs clear with no wheezing and good diaphragmatic motion Heart:  S1/S2 SEM murmur, no rub, gallop or click PMI normal Abdomen: benighn, BS positve, no tenderness, no AAA no bruit.  No HSM or HJR Distal pulses intact with no bruits No edema Neuro non-focal Skin warm and dry No muscular weakness   Lab Results: Basic Metabolic Panel:  Recent Labs  06/02/14 1956 06/03/14 0035  NA 131* 136*  K 3.5* 3.2*  CL 95* 100  CO2 21 23  GLUCOSE 96 144*  BUN 12 13  CREATININE 0.65 0.73  CALCIUM 8.4 8.7   CBC:  Recent Labs  06/02/14 1956 06/03/14 0035  WBC 6.8 5.0  HGB 12.1 12.2  HCT 35.1* 36.8  MCV 85.4 88.7  PLT 229 227   Cardiac Enzymes:  Recent Labs  06/03/14 0035 06/03/14 0600 06/03/14 1205  TROPONINI <0.30 <0.30 <0.30    Imaging: Dg Chest 2 View  06/02/2014   CLINICAL DATA:  Left-sided chest pain.  Breast carcinoma.  EXAM: CHEST  2 VIEW  COMPARISON:  02/10/2014  FINDINGS: Mild cardiomegaly is stable. Changes of COPD again demonstrated. Chronic pulmonary interstitial prominence again seen without evidence of acute infiltrate or edema. No evidence of pleural effusion. Moderate hiatal hernia again noted.  IMPRESSION: Stable cardiomegaly and COPD.  No active lung disease.  Moderate hiatal hernia.   Electronically Signed   By: Earle Gell M.D.   On: 06/02/2014  21:16    Cardiac Studies:  ECG:  SR LAD poor R wave progression   Telemetry:  NSR no significant arrhythmia  Echo:   Study Conclusions  - Left ventricle: The cavity size was normal. Wall thickness was increased in a pattern of severe LVH. There was mild focal basal hypertrophy of the septum. Systolic function was vigorous. The estimated ejection fraction was in the range of 65% to 70%. Wall motion was normal; there were no regional wall motion abnormalities. Doppler parameters are consistent with high ventricular filling pressure. - Aortic valve: Valve mobility was restricted. There was moderate stenosis. - Mitral valve: Severely calcified annulus. The findings are consistent with moderate stenosis. There was mild regurgitation. Valve area by pressure half-time: 2 cm^2. Valve area by continuity equation (using LVOT flow): 2.42 cm^2. - Left atrium: The atrium was moderately dilated. - Pulmonary arteries: Systolic pressure was mildly increased.  Impressions:  - Normal LV function; severe LVH; proximal septal thickening; mild chordal SAM; moderate AS by doppler; LVOT not well interrogated; moderate MS by mean gradient due to MAC but less severe by pressure halftime; moderate LAE; can consider TEE to further assess if clinically indicated.  Medications:   . aspirin EC  81 mg Oral Daily  . enoxaparin (LOVENOX) injection  40 mg Subcutaneous Daily  . metoprolol tartrate  12.5 mg Oral BID  . pantoprazole  40 mg Oral Daily  . polyvinyl alcohol  1 drop Both Eyes Daily  . predniSONE  5 mg Oral Q breakfast       Assessment/Plan:  Aortic Stenosis:  With LVOT component  Beta blocker added conservative Rx given age per Dr Stanford Breed Chest Pain:  For myovue today likely to be normal with her degree of LVH sensitivity lower  Jenkins Rouge 06/04/2014, 7:51 AM

## 2014-06-04 NOTE — Progress Notes (Signed)
Lexiscan stress completed without complication. Pending final result by [facility] radiology  Signed, Almyra Deforest PA Pager: [phone]

## 2014-06-04 NOTE — Discharge Summary (Signed)
Physician Discharge Summary  Martha Mcbride CBJ:[id number] DOB: 1925/08/31 DOA: 06/02/2014  PCP: No primary provider on file.  Admit date: 06/02/2014 Discharge date: 06/04/2014  Time spent: 35 minutes  Recommendations for Outpatient Follow-up:  1. Follow up with PCP  Discharge Diagnoses:  Principal Problem:   Chest pain Active Problems:   Heart murmur   Hyponatremia   Immune disorder   Aortic stenosis   Discharge Condition: stable  Diet recommendation: heart healthy  Filed Weights   06/02/14 2315 06/03/14 0529 06/04/14 0649  Weight: 63.05 kg (139 lb) 63 kg (138 lb 14.2 oz) 63 kg (138 lb 14.2 oz)    History of present illness:  80 y.o. female, with past medical history significant for arthritis, acid reflux and an immune deficiency disorder presenting today with chest pain 5/10 nonradiating not associated with shortness of breath nausea or cold sweats. The patient was vacuuming the carpet in the house and went to sit down with her daughter on the porch when the chest pain started. The chest pain improved on its own and now it's still present but much less. The pain is described as dull. No history of coronary artery disease, hypertension or diabetes mellitus   Hospital Course:  Atypical Chest pain:  - Reproducible by palpation.  - Cardiac markers negative x3.  - EKG Normal sinus rhythm Left anterior fascicular block, Left ventricular hypertrophy.  - echo no AS - cont ASA.  Consulted cardiology negative myoview was low probability for ischemia. No scintigraphic evidence of prior infarction or pharmacologically induced ischemia.   Hyponatremia  - improved with IV fluids most likely due to decrease intravascular vol.   POLYMYALGIA RHEUMATICA  - cont Leflunomide.  Procedures:  Echo  myoview  Consultations:  cardiology  Discharge Exam: Filed Vitals:   06/04/14 1207  BP: 137/71  Pulse: 85  Temp:   Resp:     General: A&O x3 Cardiovascular: RRR Respiratory: good  air movement CTA B/L  Discharge Instructions You were cared for by a hospitalist during your hospital stay. If you have any questions about your discharge medications or the care you received while you were in the hospital after you are discharged, you can call the unit and asked to speak with the hospitalist on call if the hospitalist that took care of you is not available. Once you are discharged, your primary care physician will handle any further medical issues. Please note that NO REFILLS for any discharge medications will be authorized once you are discharged, as it is imperative that you return to your primary care physician (or establish a relationship with a primary care physician if you do not have one) for your aftercare needs so that they can reassess your need for medications and monitor your lab values.  Discharge Instructions   Diet - low sodium heart healthy    Complete by:  As directed      Diet - low sodium heart healthy    Complete by:  As directed      Increase activity slowly    Complete by:  As directed      Increase activity slowly    Complete by:  As directed           Current Discharge Medication List    CONTINUE these medications which have NOT CHANGED   Details  antiseptic oral rinse (BIOTENE) LIQD 15 mLs by Mouth Rinse route as needed for dry mouth.    donepezil (ARICEPT) 10 MG tablet Take 10 mg by  mouth at bedtime.    folic acid (FOLVITE) 1 MG tablet Take 1 mg by mouth daily.    hydroxypropyl methylcellulose / hypromellose (ISOPTO TEARS / GONIOVISC) 2.5 % ophthalmic solution Place 1 drop into both eyes every morning.    ibuprofen (ADVIL,MOTRIN) 200 MG tablet Take 200 mg by mouth every 6 (six) hours as needed for moderate pain.    leflunomide (ARAVA) 20 MG tablet Take 20 mg by mouth daily.    OVER THE COUNTER MEDICATION Take 1 tablet by mouth daily. Medication: Vitron-C High potency Iron    predniSONE (DELTASONE) 5 MG tablet Take 5 mg by mouth daily with  breakfast.    RABEprazole (ACIPHEX) 20 MG tablet Take 20 mg by mouth daily.       Allergies  Allergen Reactions  . Doxycycline Nausea And Vomiting  . Penicillins   . Sulfa Antibiotics Nausea And Vomiting      The results of significant diagnostics from this hospitalization (including imaging, microbiology, ancillary and laboratory) are listed below for reference.    Significant Diagnostic Studies: Dg Chest 2 View  06/02/2014   CLINICAL DATA:  Left-sided chest pain.  Breast carcinoma.  EXAM: CHEST  2 VIEW  COMPARISON:  02/10/2014  FINDINGS: Mild cardiomegaly is stable. Changes of COPD again demonstrated. Chronic pulmonary interstitial prominence again seen without evidence of acute infiltrate or edema. No evidence of pleural effusion. Moderate hiatal hernia again noted.  IMPRESSION: Stable cardiomegaly and COPD.  No active lung disease.  Moderate hiatal hernia.   Electronically Signed   By: Earle Gell M.D.   On: 06/02/2014 21:16   Nm Myocar Multi W/spect W/wall Motion / Ef  06/04/2014   CLINICAL DATA:  Chest pain  EXAM: MYOCARDIAL IMAGING WITH SPECT (REST AND PHARMACOLOGIC-STRESS)  GATED LEFT VENTRICULAR WALL MOTION STUDY  LEFT VENTRICULAR EJECTION FRACTION  TECHNIQUE: Standard myocardial SPECT imaging was performed after resting intravenous injection of 10 mCi Tc-56m sestamibi. Subsequently, intravenous infusion of Lexiscan was performed under the supervision of the Cardiology staff. At peak effect of the drug, 30 mCi Tc-54m sestamibi was injected intravenously and standard myocardial SPECT imaging was performed. Quantitative gated imaging was also performed to evaluate left ventricular wall motion, and estimate left ventricular ejection fraction.  COMPARISON:  Chest radiograph-06/02/2021  FINDINGS: Raw images: There is mild of breast attenuation artifact. There is mild patient motion artifact on the provided stress images.  Perfusion: There is minimal attenuation involving the inferior wall  which improves in the provided stress images. There is no scintigraphic evidence of prior infarction or pharmacologically induced ischemia.  Wall Motion: Normal left ventricular wall motion. No left ventricular dilation.  Left Ventricular Ejection Fraction: 54 %  End diastolic volume 68 ml  End systolic volume  ml  IMPRESSION: 1. No scintigraphic evidence of prior infarction or pharmacologically induced ischemia.  2. Normal left ventricular wall motion.  3. Left ventricular ejection fraction 55%  4. Low-risk stress test findings*.  *2012 Appropriate Use Criteria for Coronary Revascularization Focused Update: J Am Coll Cardiol. 5681;27(5):170-017. http://content.airportbarriers.com.aspx?articleid=1201161   Electronically Signed   By: Sandi Mariscal M.D.   On: 06/04/2014 12:43    Microbiology: No results found for this or any previous visit (from the past 240 hour(s)).   Labs: Basic Metabolic Panel:  Recent Labs Lab 06/02/14 1956 06/03/14 0035  NA 131* 136*  K 3.5* 3.2*  CL 95* 100  CO2 21 23  GLUCOSE 96 144*  BUN 12 13  CREATININE 0.65 0.73  CALCIUM 8.4  8.7   Liver Function Tests: No results found for this basename: AST, ALT, ALKPHOS, BILITOT, PROT, ALBUMIN,  in the last 168 hours No results found for this basename: LIPASE, AMYLASE,  in the last 168 hours No results found for this basename: AMMONIA,  in the last 168 hours CBC:  Recent Labs Lab 06/02/14 1956 06/03/14 0035  WBC 6.8 5.0  HGB 12.1 12.2  HCT 35.1* 36.8  MCV 85.4 88.7  PLT 229 227   Cardiac Enzymes:  Recent Labs Lab 06/03/14 0035 06/03/14 0600 06/03/14 1205  TROPONINI <0.30 <0.30 <0.30   BNP: BNP (last 3 results)  Recent Labs  06/02/14 1954  PROBNP 622.2*   CBG: No results found for this basename: GLUCAP,  in the last 168 hours     Signed:  Charlynne Cousins  [facility] 06/04/2014, 1:11 PM

## 2014-06-04 NOTE — Progress Notes (Signed)
UR Completed [place] Kalbfleisch Graves-Bigelow, RN,BSN [phone]  

## 2014-06-04 NOTE — Progress Notes (Signed)
INITIAL NUTRITION ASSESSMENT  DOCUMENTATION CODES Per approved criteria  -Not Applicable   INTERVENTION: Supplement diet as appropriate.  NUTRITION DIAGNOSIS: Increased nutrient needs related to hospitalization as evidenced by estimated needs.   Goal: Pt to meet >/= 90% of their estimated nutrition needs   Monitor:  PO intake, weight trend, labs  Reason for Assessment: Pt identified as at nutrition risk on the Malnutrition Screen Tool  80 y.o. female  Admitting Dx: Chest pain  ASSESSMENT: Pt admitted with chest pain, work-up underway.  Pt is currently out of room for tests.  By hx pt lives alone.  Height: Ht Readings from Last 1 Encounters:  06/02/14 5\' 1"  (1.549 m)    Weight: Wt Readings from Last 1 Encounters:  06/04/14 138 lb 14.2 oz (63 kg)    Ideal Body Weight: 47.7 kg   % Ideal Body Weight: 132%  Wt Readings from Last 10 Encounters:  06/04/14 138 lb 14.2 oz (63 kg)    Usual Body Weight: unknown  % Usual Body Weight: -  BMI:  Body mass index is 26.26 kg/(m^2).  Estimated Nutritional Needs: Kcal: 1500-1700 Protein: 70-80 grams Fluid: > 1.5 L/day  Skin: WDL  Diet Order: General  EDUCATION NEEDS: -No education needs identified at this time  No intake or output data in the 24 hours ending 06/04/14 1042  Last BM: 9/6   Labs:   Recent Labs Lab 06/02/14 1956 06/03/14 0035  NA 131* 136*  K 3.5* 3.2*  CL 95* 100  CO2 21 23  BUN 12 13  CREATININE 0.65 0.73  CALCIUM 8.4 8.7  GLUCOSE 96 144*    CBG (last 3)  No results found for this basename: GLUCAP,  in the last 72 hours  Scheduled Meds: . aspirin EC  81 mg Oral Daily  . enoxaparin (LOVENOX) injection  40 mg Subcutaneous Daily  . metoprolol tartrate  12.5 mg Oral BID  . pantoprazole  40 mg Oral Daily  . polyvinyl alcohol  1 drop Both Eyes Daily  . predniSONE  5 mg Oral Q breakfast    Continuous Infusions:   Past Medical History  Diagnosis Date  . Arthritis   .  Polymyalgia   . Acid reflux   . Immune deficiency disorder     autoimmune disorder - polymyalgia - on prednisone  . Breast cancer     Treated with lumpectomy and radiation    Past Surgical History  Procedure Laterality Date  . Breast lumpectomy    . Partial hysterectomy    . Tonsillectomy      [place], [place], [place] Pager [phone] After Hours Pager

## 2014-06-10 LAB — CULTURE, BLOOD (ROUTINE X 2)
CULTURE: NO GROWTH
Culture: NO GROWTH

## 2014-06-13 ENCOUNTER — Other Ambulatory Visit: Payer: Self-pay

## 2014-06-13 DIAGNOSIS — Z1231 Encounter for screening mammogram for malignant neoplasm of breast: Secondary | ICD-10-CM

## 2014-06-28 ENCOUNTER — Telehealth: Payer: Self-pay | Admitting: Cardiology

## 2014-06-28 NOTE — Telephone Encounter (Signed)
Please call,pt is having all types of problems.

## 2014-06-28 NOTE — Telephone Encounter (Signed)
Spoke with pt dtr, she was started on metoprolol while in the hosp. She now has extreme memory loss, her short term memory is almost gone. She is also having nausea and will not eat. They reports she is eating less than 500 calories in a day. They want to know if there is anything else she can take besides the metoprolol. Will forward for dr Stanford Breed review

## 2014-06-28 NOTE — Telephone Encounter (Signed)
Spoke with pt dtr, Aware of dr crenshaw's recommendations.  

## 2014-06-28 NOTE — Telephone Encounter (Signed)
DC metoprolol and FU as scheduled. Kirk Ruths

## 2014-07-06 ENCOUNTER — Ambulatory Visit: Payer: Medicare Other

## 2014-07-13 ENCOUNTER — Other Ambulatory Visit: Payer: Self-pay

## 2014-07-19 ENCOUNTER — Emergency Department (HOSPITAL_COMMUNITY): Payer: Medicare Other

## 2014-07-19 ENCOUNTER — Inpatient Hospital Stay (HOSPITAL_COMMUNITY)
Admission: EM | Admit: 2014-07-19 | Discharge: 2014-07-24 | DRG: 871 | Disposition: A | Discharge status: Home Health | Payer: Medicare Other | Attending: Internal Medicine | Admitting: Internal Medicine

## 2014-07-19 ENCOUNTER — Encounter (HOSPITAL_COMMUNITY): Payer: Self-pay | Admitting: Emergency Medicine

## 2014-07-19 DIAGNOSIS — R5381 Other malaise: Secondary | ICD-10-CM

## 2014-07-19 DIAGNOSIS — Z7401 Bed confinement status: Secondary | ICD-10-CM

## 2014-07-19 DIAGNOSIS — E871 Hypo-osmolality and hyponatremia: Secondary | ICD-10-CM | POA: Diagnosis present

## 2014-07-19 DIAGNOSIS — M1990 Unspecified osteoarthritis, unspecified site: Secondary | ICD-10-CM | POA: Diagnosis present

## 2014-07-19 DIAGNOSIS — K219 Gastro-esophageal reflux disease without esophagitis: Secondary | ICD-10-CM | POA: Diagnosis present

## 2014-07-19 DIAGNOSIS — Z7952 Long term (current) use of systemic steroids: Secondary | ICD-10-CM

## 2014-07-19 DIAGNOSIS — B80 Enterobiasis: Secondary | ICD-10-CM | POA: Diagnosis present

## 2014-07-19 DIAGNOSIS — I96 Gangrene, not elsewhere classified: Secondary | ICD-10-CM | POA: Diagnosis present

## 2014-07-19 DIAGNOSIS — Y95 Nosocomial condition: Secondary | ICD-10-CM | POA: Diagnosis present

## 2014-07-19 DIAGNOSIS — A419 Sepsis, unspecified organism: Secondary | ICD-10-CM | POA: Diagnosis not present

## 2014-07-19 DIAGNOSIS — I509 Heart failure, unspecified: Secondary | ICD-10-CM

## 2014-07-19 DIAGNOSIS — Z853 Personal history of malignant neoplasm of breast: Secondary | ICD-10-CM

## 2014-07-19 DIAGNOSIS — I10 Essential (primary) hypertension: Secondary | ICD-10-CM | POA: Diagnosis present

## 2014-07-19 DIAGNOSIS — Z880 Allergy status to penicillin: Secondary | ICD-10-CM

## 2014-07-19 DIAGNOSIS — M353 Polymyalgia rheumatica: Secondary | ICD-10-CM | POA: Diagnosis present

## 2014-07-19 DIAGNOSIS — R05 Cough: Secondary | ICD-10-CM

## 2014-07-19 DIAGNOSIS — R059 Cough, unspecified: Secondary | ICD-10-CM

## 2014-07-19 DIAGNOSIS — J029 Acute pharyngitis, unspecified: Secondary | ICD-10-CM | POA: Diagnosis present

## 2014-07-19 DIAGNOSIS — G92 Toxic encephalopathy: Secondary | ICD-10-CM | POA: Diagnosis present

## 2014-07-19 DIAGNOSIS — R197 Diarrhea, unspecified: Secondary | ICD-10-CM | POA: Diagnosis present

## 2014-07-19 DIAGNOSIS — I350 Nonrheumatic aortic (valve) stenosis: Secondary | ICD-10-CM

## 2014-07-19 DIAGNOSIS — I5033 Acute on chronic diastolic (congestive) heart failure: Secondary | ICD-10-CM

## 2014-07-19 DIAGNOSIS — R011 Cardiac murmur, unspecified: Secondary | ICD-10-CM

## 2014-07-19 DIAGNOSIS — J189 Pneumonia, unspecified organism: Secondary | ICD-10-CM

## 2014-07-19 DIAGNOSIS — R509 Fever, unspecified: Secondary | ICD-10-CM | POA: Diagnosis present

## 2014-07-19 DIAGNOSIS — R0602 Shortness of breath: Secondary | ICD-10-CM

## 2014-07-19 DIAGNOSIS — Z823 Family history of stroke: Secondary | ICD-10-CM

## 2014-07-19 DIAGNOSIS — I998 Other disorder of circulatory system: Secondary | ICD-10-CM

## 2014-07-19 DIAGNOSIS — E876 Hypokalemia: Secondary | ICD-10-CM | POA: Diagnosis present

## 2014-07-19 DIAGNOSIS — J310 Chronic rhinitis: Secondary | ICD-10-CM | POA: Diagnosis present

## 2014-07-19 DIAGNOSIS — J9601 Acute respiratory failure with hypoxia: Secondary | ICD-10-CM | POA: Diagnosis present

## 2014-07-19 HISTORY — DX: Giardiasis (lambliasis): A07.1

## 2014-07-19 HISTORY — DX: Enterobiasis: B80

## 2014-07-19 LAB — CK: Total CK: 16 U/L (ref 7–177)

## 2014-07-19 LAB — CBC WITH DIFFERENTIAL/PLATELET
BASOS PCT: 1 % (ref 0–1)
Basophils Absolute: 0.1 10*3/uL (ref 0.0–0.1)
Eosinophils Absolute: 1.8 10*3/uL — ABNORMAL HIGH (ref 0.0–0.7)
Eosinophils Relative: 15 % — ABNORMAL HIGH (ref 0–5)
HEMATOCRIT: 33.6 % — AB (ref 36.0–46.0)
HEMOGLOBIN: 11.6 g/dL — AB (ref 12.0–15.0)
Lymphocytes Relative: 20 % (ref 12–46)
Lymphs Abs: 2.4 10*3/uL (ref 0.7–4.0)
MCH: 29.1 pg (ref 26.0–34.0)
MCHC: 34.5 g/dL (ref 30.0–36.0)
MCV: 84.4 fL (ref 78.0–100.0)
Monocytes Absolute: 0.9 10*3/uL (ref 0.1–1.0)
Monocytes Relative: 7 % (ref 3–12)
NEUTROS ABS: 7 10*3/uL (ref 1.7–7.7)
Neutrophils Relative %: 57 % (ref 43–77)
Platelets: 226 10*3/uL (ref 150–400)
RBC: 3.98 MIL/uL (ref 3.87–5.11)
RDW: 16.2 % — ABNORMAL HIGH (ref 11.5–15.5)
WBC: 12.2 10*3/uL — ABNORMAL HIGH (ref 4.0–10.5)

## 2014-07-19 LAB — COMPREHENSIVE METABOLIC PANEL
ALK PHOS: 100 U/L (ref 39–117)
ALT: 15 U/L (ref 0–35)
AST: 13 U/L (ref 0–37)
Albumin: 2.7 g/dL — ABNORMAL LOW (ref 3.5–5.2)
Anion gap: 15 (ref 5–15)
BILIRUBIN TOTAL: 0.7 mg/dL (ref 0.3–1.2)
BUN: 14 mg/dL (ref 6–23)
CHLORIDE: 97 meq/L (ref 96–112)
CO2: 20 meq/L (ref 19–32)
Calcium: 8.5 mg/dL (ref 8.4–10.5)
Creatinine, Ser: 0.62 mg/dL (ref 0.50–1.10)
GFR, EST NON AFRICAN AMERICAN: 79 mL/min — AB (ref >90)
GLUCOSE: 192 mg/dL — AB (ref 70–99)
POTASSIUM: 4.3 meq/L (ref 3.7–5.3)
Sodium: 132 mEq/L — ABNORMAL LOW (ref 137–147)
Total Protein: 5.9 g/dL — ABNORMAL LOW (ref 6.0–8.3)

## 2014-07-19 LAB — URINALYSIS, ROUTINE W REFLEX MICROSCOPIC
Bilirubin Urine: NEGATIVE
Glucose, UA: 100 mg/dL — AB
Hgb urine dipstick: NEGATIVE
KETONES UR: NEGATIVE mg/dL
Leukocytes, UA: NEGATIVE
NITRITE: NEGATIVE
PH: 6 (ref 5.0–8.0)
Protein, ur: NEGATIVE mg/dL
Specific Gravity, Urine: 1.021 (ref 1.005–1.030)
UROBILINOGEN UA: 0.2 mg/dL (ref 0.0–1.0)

## 2014-07-19 LAB — I-STAT TROPONIN, ED: TROPONIN I, POC: 0.02 ng/mL (ref 0.00–0.08)

## 2014-07-19 LAB — PRO B NATRIURETIC PEPTIDE: Pro B Natriuretic peptide (BNP): 1467 pg/mL — ABNORMAL HIGH (ref 0–450)

## 2014-07-19 LAB — SEDIMENTATION RATE: Sed Rate: 9 mm/hr (ref 0–22)

## 2014-07-19 LAB — I-STAT CG4 LACTIC ACID, ED: LACTIC ACID, VENOUS: 2.79 mmol/L — AB (ref 0.5–2.2)

## 2014-07-19 NOTE — ED Notes (Signed)
Hospitalist at bedside 

## 2014-07-19 NOTE — H&P (Addendum)
[facility] History and Physical  Martha Mcbride YIR:[id number] DOB: 1925/05/21 DOA: 07/19/2014  Referring physician: ED physician PCP: No primary provider on file.   Chief Complaint: Fever  HPI:  Patient is 80 year old female with polymyalgia rheumatica, on chronic prednisone and leflunomide, acid reflux, recent hospitalization for chest pain and fever, status post recent Myoview stress test with low probability for ischemia and no evidence of infarction, 2-D echo September 2015 with EF65-70%, notable for vigorous systolic function and high ventricular filling pressures, severely calcified mitral valve with moderate stenosis. Pt also had recent diagnosis of Giardia and pinworms and was started on medication one day prior to this admission. Patient was brought in to [facility] long emergency department by family member with main concern of more than one month duration of progressively worsening physical decline, fatigue, poor oral intake, bed bound. Please note that patient is unable to provide any history at the time of admission and most of the details obtained from family members at bedside. Family explained that patient has been having ongoing fever for the past week and has been fairly difficult to arouse over the past few days. Family also reports noticing 2 toes on the right foot turning black. Family explains no similar events like this in the past and they also explained that patient was very functional about a month ago. Daughter has also noticed that patient's breathing was more wet and she noted dry cough. Per family member, patient appeared to have some chest discomfort over the past week. No specific alleviating factors noted.  In emergency department, patient somnolent, able to arouse, vital signs notable for temperature 100.9 F, respiration 28 breaths per minute, oxygen saturation 93% on 2 L nasal cannula. Blood work notable for white blood cell count 12.2. Chest x-ray worrisome for  diffuse bilateral pneumonia versus pulmonary edema. [facility] asked to admit for further evaluation. Cardiology and vascular surgery consulted by ED doctor due to concern for possible endocarditis.  Assessment and Plan: Active Problems: Acute encephalopathy - Exact etiology not clear, certainly worrisome for developing pneumonia, ongoing Giardia and pinworms infection - Admit to telemetry bed - Start with providing supportive care, placed on broad-spectrum antibiotics vancomycin and Zosyn for now and taper down as clinically indicated - Hold off on IV fluids as crackles noted on lung exam Sepsis - Sepsis criteria met: T 100.9, RR > 22 breaths per minute, WBC 12.2, source possibly pneumonia, Giardia, pinworm infection - Antibiotics as noted above, continue praziquantel as started by primary care physician - Please consult infectious disease specialist in the morning for further assistance with antibiotic management - Followup on cardiology recommendations as well as vascular surgery recommendations - Patient has had recent 2-D echo so will hold off on ordering new one unless cardiology recommends otherwise Acute hypoxic respiratory failure, oxygen saturation on 2 L C 92% on admission  - secondary to PNA - ABX as noted above - sputum culture, urine legionella and strep pneumo  Diarrhea - We'll repeat stool panel, ova and parasites - Continue praziquantel as noted above Gangrenous toes on the right foot - Unclear etiology at this time, pulses on the right foot palpable with good perfusion - Will followup in vascular surgery recommendations Polymyalgia rheumatica - Patient on prednisone and leflunomide, will continue prednisone but will hold leflunomide for now  Acid reflux - continue Protonix  Ulcerative lesions on the tongue - Possibly from prednisone use and leflunomide - Continue biotene   Lovenox for DVT prophylaxis   Radiological Exams  on Admission: Dg Chest 2 View   07/19/2014   Poor inspiration with interval diffuse bilateral pneumonia or alveolar edema.    Dg Foot Complete Right  07/19/2014  No evidence of osteomyelitis. Soft tissue thinning in the distal second digit.      Code Status: Full Family Communication: Pt at bedside Disposition Plan: Admit for further evaluation     Review of Systems:  Unable to obtain due to AMS    Past Medical History  Diagnosis Date  . Arthritis   . Polymyalgia   . Acid reflux   . Immune deficiency disorder     autoimmune disorder - polymyalgia - on prednisone  . Breast cancer     Treated with lumpectomy and radiation  . Giardia   . Pinworms     Past Surgical History  Procedure Laterality Date  . Breast lumpectomy    . Partial hysterectomy    . Tonsillectomy      Social History:  reports that she has never smoked. She does not have any smokeless tobacco history on file. She reports that she does not drink alcohol or use illicit drugs.  Allergies  Allergen Reactions  . Doxycycline Nausea And Vomiting  . Other Nausea And Vomiting and Other (See Comments)    Anesthesia makes her very sleepy and makes it hard for her to come out of it.   Marland Kitchen Penicillins Hives and Nausea And Vomiting  . Sulfa Antibiotics Nausea And Vomiting    Family History  Problem Relation Age of Onset  . Stroke Mother     Prior to Admission medications   Medication Sig Start Date End Date Taking? Authorizing Provider  antiseptic oral rinse (BIOTENE) LIQD 15 mLs by Mouth Rinse route as needed for dry mouth.   Yes Historical Provider, MD  folic acid (FOLVITE) 1 MG tablet Take 1 mg by mouth daily.   Yes Historical Provider, MD  hydroxypropyl methylcellulose / hypromellose (ISOPTO TEARS / GONIOVISC) 2.5 % ophthalmic solution Place 1 drop into both eyes every morning.   Yes Historical Provider, MD  OVER THE COUNTER MEDICATION Take 1 tablet by mouth daily.   Yes Historical Provider, MD  praziquantel (BILTRICIDE) 600 MG tablet Take  600 mg by mouth every other day. For a total of 3 days.   Yes Historical Provider, MD  predniSONE (DELTASONE) 5 MG tablet Take 5 mg by mouth daily with breakfast.   Yes Historical Provider, MD  RABEprazole (ACIPHEX) 20 MG tablet Take 20 mg by mouth daily.   Yes Historical Provider, MD    Physical Exam: Filed Vitals:   07/19/14 2215 07/19/14 2230 07/19/14 2240 07/19/14 2258  BP:    140/65  Pulse: 62 95 94 94  Temp:      TempSrc:      Resp: $Remo'28 24 22 22  'ERjqb$ SpO2: 96% 95% 95% 93%    Physical Exam  Constitutional: Appears somnolent, easy to arouse, not in acute distress HENT: Dry MM, oral ulcerative lesions over the palate and pharynx as well as some small areas on the tongue Eyes: Conjunctivae and EOM are normal. PERRLA, no scleral icterus.  Neck: Normal ROM. Neck supple. No JVD. No tracheal deviation. No thyromegaly.  CVS: RRR, systolic ejection murmur 3/6, no gallops, no carotid bruit.  Pulmonary: Effort and breath sounds normal,  bibasilar crackles crackles, dry cough on exam, bilateral rales in the upper Abdominal: Soft. BS +,  no distension, tenderness, rebound or guarding.  Musculoskeletal: Bilateral lower extremity pitting edema, second  and third toe on the right foot gangrenous  Lymphadenopathy: No lymphadenopathy noted, cervical, inguinal. Neuro: No cranial nerve deficit. Skin: Skin is warm and dry. No erythema. No pallor.  Psychiatric: Unable to assess due to 2 altered mental  Labs on Admission:  Basic Metabolic Panel:  Recent Labs Lab 07/19/14 2039  NA 132*  K 4.3  CL 97  CO2 20  GLUCOSE 192*  BUN 14  CREATININE 0.62  CALCIUM 8.5   Liver Function Tests:  Recent Labs Lab 07/19/14 2039  AST 13  ALT 15  ALKPHOS 100  BILITOT 0.7  PROT 5.9*  ALBUMIN 2.7*   Cardiac Enzymes:  Recent Labs Lab 07/19/14 2039  CKTOTAL 16   EKG: Normal sinus rhythm, no ST/T wave changes  Faye Ramsay, MD  [facility] Pager [phone]  If 7PM-7AM, please  contact night-coverage [email or web address] Password [email or web address] 07/19/2014, 11:20 PM

## 2014-07-19 NOTE — ED Notes (Signed)
Pt reports her R middle toes are darkened, began a week ago. R index toe appears necrosed. Pt unable to feel middle toes. No hx of diabetes. Had blood cultures drawn a week ago, which came back negative. Pt began treatment for giardia, pinworms and roundworms earlier today. No injury to toes

## 2014-07-19 NOTE — ED Notes (Signed)
Additional pillow was taken to Pt at family's request.

## 2014-07-19 NOTE — ED Provider Notes (Signed)
CSN: [id number]     Arrival date & time 07/19/14  1829 History   First MD Initiated Contact with Patient 07/19/14 2008     Chief Complaint  Patient presents with  . Toe Pain     (Consider location/radiation/quality/duration/timing/severity/associated sxs/prior Treatment) HPI Comments: Patient symptoms started approximately 6 weeks ago when he she had general physical decline, fatigue and decreased oral intake. This started shortly after her hospitalization [facility]. At that time she had a normal stress test and an echo that showed valvular calcifications. Since patient has been home she has had intermittent fever spikes over the last 3-4 weeks up to 101 every day. 2 weeks ago she started developing discomfort in her mouth with sores. She has tried Magic mouthwash, nystatin but nothing seems to be making them better. Approximately 1-1/2 weeks ago he started to notice her right toes looking a bit dusky. Her physician recommended that she soak and they felt that the left toes look better but the right continues to look worse and she complains of worsening pain until today her toes were black and were concerned and wanted her evaluated. Patient also recently was discovered to have Giardia, pinworm and possibly a round worm in her stool and started antibiotic therapy today. She's recently been evaluated by her rheumatologist who did blood work without any specific findings and negative blood cultures.  Also today daughter has noticed her breathing has sounded more wet the patient denies cough or shortness of breath  Patient is a 80 y.o. female presenting with toe pain. The history is provided by a relative and a caregiver.  Toe Pain    Past Medical History  Diagnosis Date  . Arthritis   . Polymyalgia   . Acid reflux   . Immune deficiency disorder     autoimmune disorder - polymyalgia - on prednisone  . Breast cancer     Treated with lumpectomy and radiation  . Giardia   .  Pinworms    Past Surgical History  Procedure Laterality Date  . Breast lumpectomy    . Partial hysterectomy    . Tonsillectomy     Family History  Problem Relation Age of Onset  . Stroke Mother    History  Substance Use Topics  . Smoking status: Never Smoker   . Smokeless tobacco: Not on file  . Alcohol Use: No   OB History   Grav Para Term Preterm Abortions TAB SAB Ect Mult Living                 Review of Systems  All other systems reviewed and are negative.     Allergies  Doxycycline; Penicillins; and Sulfa antibiotics  Home Medications   Prior to Admission medications   Medication Sig Start Date End Date Taking? Authorizing Provider  antiseptic oral rinse (BIOTENE) LIQD 15 mLs by Mouth Rinse route as needed for dry mouth.    Historical Provider, MD  donepezil (ARICEPT) 10 MG tablet Take 10 mg by mouth at bedtime.    Historical Provider, MD  folic acid (FOLVITE) 1 MG tablet Take 1 mg by mouth daily.    Historical Provider, MD  hydroxypropyl methylcellulose / hypromellose (ISOPTO TEARS / GONIOVISC) 2.5 % ophthalmic solution Place 1 drop into both eyes every morning.    Historical Provider, MD  ibuprofen (ADVIL,MOTRIN) 200 MG tablet Take 200 mg by mouth every 6 (six) hours as needed for moderate pain.    Historical Provider, MD  leflunomide Jolee Ewing)  20 MG tablet Take 20 mg by mouth daily.    Historical Provider, MD  metoprolol tartrate (LOPRESSOR) 12.5 mg TABS tablet Take 0.5 tablets (12.5 mg total) by mouth 2 (two) times daily. 06/04/14   Charlynne Cousins, MD  OVER THE COUNTER MEDICATION Take 1 tablet by mouth daily. Medication: Vitron-C High potency Iron    Historical Provider, MD  predniSONE (DELTASONE) 5 MG tablet Take 5 mg by mouth daily with breakfast.    Historical Provider, MD  RABEprazole (ACIPHEX) 20 MG tablet Take 20 mg by mouth daily.    Historical Provider, MD   BP 141/80  Pulse 90  Temp(Src) 98.2 F (36.8 C) (Oral)  Resp 20  SpO2 93% Physical Exam    Nursing note and vitals reviewed. Constitutional: She is oriented to person, place, and time. She appears well-developed and well-nourished. No distress.  HENT:  Head: Normocephalic and atraumatic.  Mouth/Throat: Oropharynx is clear and moist. Mucous membranes are dry. Oral lesions present.  Oral ulcerative lesions over the palate and pharynx as well as some small areas on the tongue  Eyes: Conjunctivae and EOM are normal. Pupils are equal, round, and reactive to light.  Neck: Normal range of motion. Neck supple.  Cardiovascular: Normal rate, regular rhythm and intact distal pulses.   Murmur heard.  Systolic murmur is present with a grade of 1/6  Pulmonary/Chest: Effort normal and breath sounds normal. No respiratory distress. She has no wheezes. She has no rales.  Abdominal: Soft. She exhibits no distension. There is no tenderness. There is no rebound and no guarding.  Musculoskeletal: Normal range of motion. She exhibits tenderness. She exhibits no edema.       Feet:  Neurological: She is alert and oriented to person, place, and time.  Skin: Skin is warm and dry. No rash noted. No erythema.  Psychiatric: She has a normal mood and affect. Her behavior is normal.    ED Course  Procedures (including critical care time) Labs Review Labs Reviewed  COMPREHENSIVE METABOLIC PANEL - Abnormal; Notable for the following:    Sodium 132 (*)    Glucose, Bld 192 (*)    Total Protein 5.9 (*)    Albumin 2.7 (*)    GFR calc non Af Amer 79 (*)    All other components within normal limits  URINALYSIS, ROUTINE W REFLEX MICROSCOPIC - Abnormal; Notable for the following:    Glucose, UA 100 (*)    All other components within normal limits  PRO B NATRIURETIC PEPTIDE - Abnormal; Notable for the following:    Pro B Natriuretic peptide (BNP) 1467.0 (*)    All other components within normal limits  CBC WITH DIFFERENTIAL - Abnormal; Notable for the following:    WBC 12.2 (*)    Hemoglobin 11.6 (*)     HCT 33.6 (*)    RDW 16.2 (*)    All other components within normal limits  I-STAT CG4 LACTIC ACID, ED - Abnormal; Notable for the following:    Lactic Acid, Venous 2.79 (*)    All other components within normal limits  SEDIMENTATION RATE  CK  CBC WITH DIFFERENTIAL  CBC WITH DIFFERENTIAL  Randolm Idol, ED    Imaging Review Dg Chest 2 View  07/19/2014   CLINICAL DATA:  Cough and weakness.  History of breast cancer.  EXAM: CHEST  2 VIEW  COMPARISON:  06/02/2014.  FINDINGS: Poor inspiration. Mildly enlarged cardiac silhouette with an interval decrease in size. Interval patchy airspace opacity throughout the  majority of both lungs. No definite pleural fluid. Diffuse osteopenia. Stable thoracolumbar spine compression deformity and atheromatous arterial calcifications.  IMPRESSION: Poor inspiration with interval diffuse bilateral pneumonia or alveolar edema.   Electronically Signed   By: Enrique Sack M.D.   On: 07/19/2014 21:30   Dg Foot Complete Right  07/19/2014   CLINICAL DATA:  Patient reports the second and third post appeared darkened and necrosis, started 1 week ago.  EXAM: RIGHT FOOT COMPLETE - 3+ VIEW  COMPARISON:  None.  FINDINGS: There is no evidence of fracture or dislocation. There is no evidence of osteomyelitis. There is soft tissue thinning in the distal second digit.  IMPRESSION: No evidence of osteomyelitis. Soft tissue thinning in the distal second digit.   Electronically Signed   By: Abelardo Diesel M.D.   On: 07/19/2014 21:26     EKG Interpretation   Date/Time:  Thursday July 19 2014 20:54:49 EDT Ventricular Rate:  110 PR Interval:  161 QRS Duration: 90 QT Interval:  340 QTC Calculation: 460 R Axis:   -31 Text Interpretation:  Sinus tachycardia Supraventricular bigeminy Probable  left atrial enlargement Left ventricular hypertrophy Anterior infarct, old  No significant change since last tracing Confirmed by [facility]  MD,  Loree Fee ([id number]) on 07/19/2014 9:00:10  PM      MDM   Final diagnoses:  Cough  Other specified fever  Ischemia of toe  Acute congestive heart failure, unspecified congestive heart failure type    Patient with prolonged symptoms over the last 6 weeks with intermittent fevers up to 101, weight loss, generalized weakness, general physical decline.  In the last one to 2 weeks patient has developed mouth ulcerations and most recently color changes in her right toes to where now they are black and painful. Patient was recently diagnosed with pinworm in Giardia and treated with antibiotics starting today. On exam patient has arterial occlusion with necrotic did the distal portion of the toe on the right second and third digit. She has a 2+ dorsalis pedis pulse the foot is warm and well perfused. The left foot is within normal limits. Patient does have a heart murmur however she had an echo done approximately 6 weeks ago that showed aortic stenosis and mitral valve calcifications. She has no signs of fluid overload but does have rhonchi on lung exam and sats 89% on room air without prior history of similar. She is immunosuppressed due to her polymyalgia she is taking prednisone daily. Concern for possible bacteremia versus subacute endocarditis versus other infectious etiology as the cause of her symptoms.  CBC, CMP, UA, ESR, CK, BMP, lactate, troponin, chest x-ray, foot imaging pending  11:26 PM Pt found to be in chf and cardiology consulted for further care.  Dr. Oneida Alar consulted with vascular surgery pt will be admitted by Dr. Lynett Grimes, MD 07/19/14 [phone]

## 2014-07-19 NOTE — ED Notes (Signed)
EKG given to EDP,Plunkett,MD., for review. 

## 2014-07-20 ENCOUNTER — Encounter (HOSPITAL_COMMUNITY)
Admission: EM | Disposition: A | Discharge status: Home Health | Payer: Self-pay | Source: Home / Self Care | Attending: Internal Medicine

## 2014-07-20 ENCOUNTER — Encounter (HOSPITAL_COMMUNITY): Payer: Self-pay | Admitting: *Deleted

## 2014-07-20 DIAGNOSIS — E876 Hypokalemia: Secondary | ICD-10-CM | POA: Diagnosis present

## 2014-07-20 DIAGNOSIS — M1990 Unspecified osteoarthritis, unspecified site: Secondary | ICD-10-CM | POA: Diagnosis present

## 2014-07-20 DIAGNOSIS — Z853 Personal history of malignant neoplasm of breast: Secondary | ICD-10-CM | POA: Diagnosis not present

## 2014-07-20 DIAGNOSIS — R509 Fever, unspecified: Secondary | ICD-10-CM | POA: Diagnosis present

## 2014-07-20 DIAGNOSIS — B80 Enterobiasis: Secondary | ICD-10-CM | POA: Diagnosis present

## 2014-07-20 DIAGNOSIS — R05 Cough: Secondary | ICD-10-CM | POA: Diagnosis present

## 2014-07-20 DIAGNOSIS — Z7952 Long term (current) use of systemic steroids: Secondary | ICD-10-CM | POA: Diagnosis not present

## 2014-07-20 DIAGNOSIS — J189 Pneumonia, unspecified organism: Secondary | ICD-10-CM

## 2014-07-20 DIAGNOSIS — J029 Acute pharyngitis, unspecified: Secondary | ICD-10-CM | POA: Diagnosis present

## 2014-07-20 DIAGNOSIS — E871 Hypo-osmolality and hyponatremia: Secondary | ICD-10-CM | POA: Diagnosis present

## 2014-07-20 DIAGNOSIS — I70261 Atherosclerosis of native arteries of extremities with gangrene, right leg: Secondary | ICD-10-CM

## 2014-07-20 DIAGNOSIS — I96 Gangrene, not elsewhere classified: Secondary | ICD-10-CM | POA: Diagnosis present

## 2014-07-20 DIAGNOSIS — I10 Essential (primary) hypertension: Secondary | ICD-10-CM | POA: Diagnosis present

## 2014-07-20 DIAGNOSIS — I350 Nonrheumatic aortic (valve) stenosis: Secondary | ICD-10-CM | POA: Diagnosis present

## 2014-07-20 DIAGNOSIS — J9601 Acute respiratory failure with hypoxia: Secondary | ICD-10-CM | POA: Diagnosis present

## 2014-07-20 DIAGNOSIS — I998 Other disorder of circulatory system: Secondary | ICD-10-CM

## 2014-07-20 DIAGNOSIS — M353 Polymyalgia rheumatica: Secondary | ICD-10-CM | POA: Diagnosis present

## 2014-07-20 DIAGNOSIS — I509 Heart failure, unspecified: Secondary | ICD-10-CM

## 2014-07-20 DIAGNOSIS — R197 Diarrhea, unspecified: Secondary | ICD-10-CM | POA: Diagnosis present

## 2014-07-20 DIAGNOSIS — Z823 Family history of stroke: Secondary | ICD-10-CM | POA: Diagnosis not present

## 2014-07-20 DIAGNOSIS — G92 Toxic encephalopathy: Secondary | ICD-10-CM | POA: Diagnosis present

## 2014-07-20 DIAGNOSIS — K219 Gastro-esophageal reflux disease without esophagitis: Secondary | ICD-10-CM | POA: Diagnosis present

## 2014-07-20 DIAGNOSIS — Z880 Allergy status to penicillin: Secondary | ICD-10-CM | POA: Diagnosis not present

## 2014-07-20 DIAGNOSIS — Z7401 Bed confinement status: Secondary | ICD-10-CM | POA: Diagnosis not present

## 2014-07-20 DIAGNOSIS — Y95 Nosocomial condition: Secondary | ICD-10-CM | POA: Diagnosis present

## 2014-07-20 DIAGNOSIS — A419 Sepsis, unspecified organism: Secondary | ICD-10-CM | POA: Diagnosis present

## 2014-07-20 DIAGNOSIS — J310 Chronic rhinitis: Secondary | ICD-10-CM | POA: Diagnosis present

## 2014-07-20 DIAGNOSIS — I5033 Acute on chronic diastolic (congestive) heart failure: Secondary | ICD-10-CM | POA: Diagnosis present

## 2014-07-20 HISTORY — PX: TEE WITHOUT CARDIOVERSION: SHX5443

## 2014-07-20 LAB — GI PATHOGEN PANEL BY PCR, STOOL
C difficile toxin A/B: NEGATIVE
CAMPYLOBACTER BY PCR: NEGATIVE
CRYPTOSPORIDIUM BY PCR: NEGATIVE
E coli (ETEC) LT/ST: NEGATIVE
E coli (STEC): NEGATIVE
E coli 0157 by PCR: NEGATIVE
G lamblia by PCR: NEGATIVE
Norovirus GI/GII: NEGATIVE
Rotavirus A by PCR: NEGATIVE
Salmonella by PCR: NEGATIVE
Shigella by PCR: NEGATIVE

## 2014-07-20 LAB — EXPECTORATED SPUTUM ASSESSMENT W REFEX TO RESP CULTURE

## 2014-07-20 LAB — URINALYSIS, ROUTINE W REFLEX MICROSCOPIC
Bilirubin Urine: NEGATIVE
Glucose, UA: NEGATIVE mg/dL
Hgb urine dipstick: NEGATIVE
KETONES UR: NEGATIVE mg/dL
LEUKOCYTES UA: NEGATIVE
NITRITE: NEGATIVE
PH: 6.5 (ref 5.0–8.0)
PROTEIN: NEGATIVE mg/dL
Specific Gravity, Urine: 1.01 (ref 1.005–1.030)
UROBILINOGEN UA: 0.2 mg/dL (ref 0.0–1.0)

## 2014-07-20 LAB — BASIC METABOLIC PANEL
ANION GAP: 13 (ref 5–15)
BUN: 12 mg/dL (ref 6–23)
CO2: 22 mEq/L (ref 19–32)
CREATININE: 0.6 mg/dL (ref 0.50–1.10)
Calcium: 8.5 mg/dL (ref 8.4–10.5)
Chloride: 99 mEq/L (ref 96–112)
GFR calc non Af Amer: 80 mL/min — ABNORMAL LOW (ref >90)
Glucose, Bld: 148 mg/dL — ABNORMAL HIGH (ref 70–99)
Potassium: 4.3 mEq/L (ref 3.7–5.3)
Sodium: 134 mEq/L — ABNORMAL LOW (ref 137–147)

## 2014-07-20 LAB — STREP PNEUMONIAE URINARY ANTIGEN: STREP PNEUMO URINARY ANTIGEN: NEGATIVE

## 2014-07-20 LAB — LEGIONELLA ANTIGEN, URINE

## 2014-07-20 LAB — EXPECTORATED SPUTUM ASSESSMENT W GRAM STAIN, RFLX TO RESP C

## 2014-07-20 LAB — CBC
HCT: 35 % — ABNORMAL LOW (ref 36.0–46.0)
Hemoglobin: 11.9 g/dL — ABNORMAL LOW (ref 12.0–15.0)
MCH: 28.6 pg (ref 26.0–34.0)
MCHC: 34 g/dL (ref 30.0–36.0)
MCV: 84.1 fL (ref 78.0–100.0)
Platelets: 228 10*3/uL (ref 150–400)
RBC: 4.16 MIL/uL (ref 3.87–5.11)
RDW: 16.3 % — ABNORMAL HIGH (ref 11.5–15.5)
WBC: 12.5 10*3/uL — ABNORMAL HIGH (ref 4.0–10.5)

## 2014-07-20 LAB — TSH: TSH: 2.45 u[IU]/mL (ref 0.350–4.500)

## 2014-07-20 SURGERY — ECHOCARDIOGRAM, TRANSESOPHAGEAL
Anesthesia: Moderate Sedation

## 2014-07-20 MED ORDER — BUDESONIDE 0.25 MG/2ML IN SUSP
0.2500 mg | Freq: Two times a day (BID) | RESPIRATORY_TRACT | Status: DC
Start: 2014-07-20 — End: 2014-07-24
  Administered 2014-07-20 – 2014-07-24 (×8): 0.25 mg via RESPIRATORY_TRACT
  Filled 2014-07-20 (×10): qty 2

## 2014-07-20 MED ORDER — VANCOMYCIN HCL IN DEXTROSE 1-5 GM/200ML-% IV SOLN
1000.0000 mg | Freq: Once | INTRAVENOUS | Status: AC
  Administered 2014-07-20: 1000 mg via INTRAVENOUS
  Filled 2014-07-20: qty 200

## 2014-07-20 MED ORDER — FUROSEMIDE 10 MG/ML IJ SOLN
20.0000 mg | Freq: Two times a day (BID) | INTRAMUSCULAR | Status: DC
  Administered 2014-07-20 – 2014-07-22 (×4): 20 mg via INTRAVENOUS
  Filled 2014-07-20 (×4): qty 2

## 2014-07-20 MED ORDER — VANCOMYCIN HCL 500 MG IV SOLR
500.0000 mg | Freq: Two times a day (BID) | INTRAVENOUS | Status: DC
  Administered 2014-07-20 – 2014-07-23 (×6): 500 mg via INTRAVENOUS
  Filled 2014-07-20 (×7): qty 500

## 2014-07-20 MED ORDER — SODIUM CHLORIDE 0.9 % IJ SOLN
3.0000 mL | Freq: Two times a day (BID) | INTRAMUSCULAR | Status: DC
  Administered 2014-07-21 – 2014-07-22 (×3): 3 mL via INTRAVENOUS

## 2014-07-20 MED ORDER — DEXTROSE 5 % IV SOLN
2.0000 g | Freq: Three times a day (TID) | INTRAVENOUS | Status: DC
  Administered 2014-07-20 – 2014-07-23 (×10): 2 g via INTRAVENOUS
  Filled 2014-07-20 (×11): qty 2

## 2014-07-20 MED ORDER — FOLIC ACID 1 MG PO TABS
1.0000 mg | ORAL_TABLET | Freq: Every day | ORAL | Status: DC
  Administered 2014-07-20 – 2014-07-24 (×5): 1 mg via ORAL
  Filled 2014-07-20 (×5): qty 1

## 2014-07-20 MED ORDER — PREDNISONE 5 MG PO TABS
5.0000 mg | ORAL_TABLET | Freq: Every day | ORAL | Status: DC
  Administered 2014-07-20 – 2014-07-24 (×5): 5 mg via ORAL
  Filled 2014-07-20 (×5): qty 1

## 2014-07-20 MED ORDER — FUROSEMIDE 10 MG/ML IJ SOLN
40.0000 mg | Freq: Once | INTRAMUSCULAR | Status: AC
  Administered 2014-07-20: 40 mg via INTRAVENOUS
  Filled 2014-07-20: qty 4

## 2014-07-20 MED ORDER — BIOTENE DRY MOUTH MT LIQD: 15.0000 mL | OROMUCOSAL | Status: DC | PRN

## 2014-07-20 MED ORDER — PANTOPRAZOLE SODIUM 40 MG PO TBEC
40.0000 mg | DELAYED_RELEASE_TABLET | Freq: Every day | ORAL | Status: DC
  Administered 2014-07-20 – 2014-07-24 (×5): 40 mg via ORAL
  Filled 2014-07-20 (×6): qty 1

## 2014-07-20 MED ORDER — FENTANYL CITRATE 0.05 MG/ML IJ SOLN
INTRAMUSCULAR | Status: DC | PRN
  Administered 2014-07-20 (×2): 12.5 ug via INTRAVENOUS

## 2014-07-20 MED ORDER — PRAZIQUANTEL 600 MG PO TABS
600.0000 mg | ORAL_TABLET | ORAL | Status: DC
  Administered 2014-07-21: 600 mg via ORAL

## 2014-07-20 MED ORDER — ONDANSETRON HCL 4 MG/2ML IJ SOLN: 4.0000 mg | Freq: Four times a day (QID) | INTRAMUSCULAR | Status: DC | PRN

## 2014-07-20 MED ORDER — SODIUM CHLORIDE 0.9 % IV SOLN
250.0000 mL | INTRAVENOUS | Status: DC | PRN
  Administered 2014-07-21 – 2014-07-23 (×2): 250 mL via INTRAVENOUS

## 2014-07-20 MED ORDER — DEXTROMETHORPHAN POLISTIREX 30 MG/5ML PO LQCR
30.0000 mg | Freq: Two times a day (BID) | ORAL | Status: DC | PRN
  Administered 2014-07-21 – 2014-07-22 (×3): 30 mg via ORAL
  Filled 2014-07-20 (×4): qty 5

## 2014-07-20 MED ORDER — CETYLPYRIDINIUM CHLORIDE 0.05 % MT LIQD
7.0000 mL | Freq: Two times a day (BID) | OROMUCOSAL | Status: DC
  Administered 2014-07-20 – 2014-07-24 (×10): 7 mL via OROMUCOSAL

## 2014-07-20 MED ORDER — POLYVINYL ALCOHOL 1.4 % OP SOLN
1.0000 [drp] | Freq: Every morning | OPHTHALMIC | Status: DC
  Administered 2014-07-20 – 2014-07-24 (×5): 1 [drp] via OPHTHALMIC
  Filled 2014-07-20: qty 15

## 2014-07-20 MED ORDER — ONDANSETRON HCL 4 MG PO TABS: 4.0000 mg | ORAL_TABLET | Freq: Four times a day (QID) | ORAL | Status: DC | PRN

## 2014-07-20 MED ORDER — MIDAZOLAM HCL 5 MG/ML IJ SOLN
INTRAMUSCULAR | Status: AC
  Filled 2014-07-20: qty 2

## 2014-07-20 MED ORDER — FENTANYL CITRATE 0.05 MG/ML IJ SOLN
INTRAMUSCULAR | Status: AC
  Filled 2014-07-20: qty 2

## 2014-07-20 MED ORDER — SODIUM CHLORIDE 0.9 % IJ SOLN
3.0000 mL | INTRAMUSCULAR | Status: DC | PRN
Start: 2014-07-20 — End: 2014-07-24

## 2014-07-20 MED ORDER — SACCHAROMYCES BOULARDII 250 MG PO CAPS
250.0000 mg | ORAL_CAPSULE | Freq: Two times a day (BID) | ORAL | Status: DC
  Administered 2014-07-20 – 2014-07-24 (×8): 250 mg via ORAL
  Filled 2014-07-20 (×8): qty 1

## 2014-07-20 MED ORDER — ENOXAPARIN SODIUM 40 MG/0.4ML ~~LOC~~ SOLN
40.0000 mg | SUBCUTANEOUS | Status: DC
  Administered 2014-07-20 – 2014-07-24 (×5): 40 mg via SUBCUTANEOUS
  Filled 2014-07-20 (×5): qty 0.4

## 2014-07-20 MED ORDER — MIDAZOLAM HCL 10 MG/2ML IJ SOLN
INTRAMUSCULAR | Status: DC | PRN
  Administered 2014-07-20 (×2): 1 mg via INTRAVENOUS

## 2014-07-20 MED ORDER — BUTAMBEN-TETRACAINE-BENZOCAINE 2-2-14 % EX AERO
INHALATION_SPRAY | CUTANEOUS | Status: DC | PRN
Start: 2014-07-20 — End: 2014-07-20
  Administered 2014-07-20: 2 via TOPICAL

## 2014-07-20 MED ORDER — SODIUM CHLORIDE 0.9 % IJ SOLN
3.0000 mL | Freq: Two times a day (BID) | INTRAMUSCULAR | Status: DC
  Administered 2014-07-20 – 2014-07-22 (×4): 3 mL via INTRAVENOUS

## 2014-07-20 MED ORDER — ASPIRIN 81 MG PO CHEW
81.0000 mg | CHEWABLE_TABLET | Freq: Every day | ORAL | Status: DC
  Administered 2014-07-20 – 2014-07-24 (×5): 81 mg via ORAL
  Filled 2014-07-20 (×5): qty 1

## 2014-07-20 MED ORDER — HYDROCODONE-ACETAMINOPHEN 5-325 MG PO TABS: 1.0000 | ORAL_TABLET | ORAL | Status: DC | PRN

## 2014-07-20 MED ORDER — VANCOMYCIN HCL 500 MG IV SOLR
500.0000 mg | Freq: Two times a day (BID) | INTRAVENOUS | Status: DC
  Filled 2014-07-20: qty 500

## 2014-07-20 NOTE — CV Procedure (Signed)
TEE  Exclude endocarditis  No vegetations.  Calcified aortic and mitral valve.  Normal EF MAC Mild mitral stenosis Moderate aortic stenosis (at the least)  Reassuring study  Tolerated procedure well.   Candee Furbish, MD

## 2014-07-20 NOTE — Progress Notes (Signed)
ANTIBIOTIC CONSULT NOTE - INITIAL  Pharmacy Consult for Vancomycin and Azactam Indication: Bacteremia/HCAP  Allergies  Allergen Reactions  . Doxycycline Nausea And Vomiting  . Other Nausea And Vomiting and Other (See Comments)    Anesthesia makes her very sleepy and makes it hard for her to come out of it.   Marland Kitchen Penicillins Hives and Nausea And Vomiting  . Sulfa Antibiotics Nausea And Vomiting    Patient Measurements: Height: 5\' 1"  (154.9 cm) Weight: 151 lb 3.2 oz (68.584 kg) IBW/kg (Calculated) : 47.8   Vital Signs: Temp: 98.8 F (37.1 C) (10/23 0130) Temp Source: Oral (10/23 0130) BP: 150/69 mmHg (10/23 0130) Pulse Rate: 99 (10/23 0130) Intake/Output from previous day:   Intake/Output from this shift:    Labs:  Recent Labs  07/19/14 2039 07/19/14 2222 07/20/14 0215  WBC  --  12.2* 12.5*  HGB  --  11.6* 11.9*  PLT  --  226 228  CREATININE 0.62  --  0.60   Estimated Creatinine Clearance: 43.9 ml/min (by C-G formula based on Cr of 0.6). No results found for this basename: Letta Median, VANCORANDOM, GENTTROUGH, GENTPEAK, GENTRANDOM, TOBRATROUGH, TOBRAPEAK, TOBRARND, AMIKACINPEAK, AMIKACINTROU, AMIKACIN,  in the last 72 hours   Microbiology: Recent Results (from the past 720 hour(s))  CULTURE, EXPECTORATED SPUTUM-ASSESSMENT     Status: None   Collection Time    07/20/14  2:09 AM      Result Value Ref Range Status   Specimen Description Expect. Sput   Final   Special Requests NONE   Final   Sputum evaluation     Final   Value: THIS SPECIMEN IS ACCEPTABLE. RESPIRATORY CULTURE REPORT TO FOLLOW.   Report Status 07/20/2014 FINAL   Final    Medical History: Past Medical History  Diagnosis Date  . Arthritis   . Polymyalgia   . Acid reflux   . Immune deficiency disorder     autoimmune disorder - polymyalgia - on prednisone  . Breast cancer     Treated with lumpectomy and radiation  . Giardia   . Pinworms     Medications:  Scheduled:  .  antiseptic oral rinse  7 mL Mouth Rinse BID  . aspirin  81 mg Oral Daily  . aztreonam  2 g Intravenous Q8H  . enoxaparin (LOVENOX) injection  40 mg Subcutaneous Q24H  . folic acid  1 mg Oral Daily  . furosemide  40 mg Intravenous Once  . pantoprazole  40 mg Oral Daily  . polyvinyl alcohol  1 drop Both Eyes q morning - 10a  . [START ON 07/21/2014] praziquantel  600 mg Oral QODAY  . predniSONE  5 mg Oral Q breakfast  . sodium chloride  3 mL Intravenous Q12H  . sodium chloride  3 mL Intravenous Q12H  . vancomycin  500 mg Intravenous Q12H  . vancomycin  1,000 mg Intravenous Once   Infusions:   Assessment: 5 yoF s/p recent hospitilization for CP/fever now with fever.  Vancomycin and Azactam for Bacteremia/HCAP.   Goal of Therapy:  Vancomycin trough level 15-20 mcg/ml  Plan:   Vancomycin 1Gm x1 then 500mg  IV q12h  Azactam 2Gm IV q8h  F/U SCr/levels/cultures as needed.   [name] R 07/20/2014,3:54 AM

## 2014-07-20 NOTE — Care Management Note (Addendum)
    Page 1 of 2   07/24/2014     4:17:08 PM CARE MANAGEMENT NOTE 07/24/2014  Patient:  Martha Mcbride, Martha Mcbride   Account Number:  [id number]  Date Initiated:  07/20/2014  Documentation initiated by:  Martha Mcbride  Subjective/Objective Assessment:   80 Y/O F ADMITTED W/FEVER.VZ:CHYIFOY,DXAJOINO,MVEHMC CA,GANGRENOUS R FOOT TOES.     Action/Plan:   FROM HOME ALONE.DTR LIVES CLOSE BY.   Anticipated DC Date:  07/24/2014   Anticipated DC Plan:  [place]  CM consult      Choice offered to / List presented to:  C-4 Adult Children        HH arranged  HH-1 RN  [place]      [place].   Status of service:  Completed, signed off Medicare Important Message given?  YES (If response is "NO", the following Medicare IM given date fields will be blank) Date Medicare IM given:  07/24/2014 Medicare IM given by:  [facility] Date Additional Medicare IM given:   Additional Medicare IM given by:    Discharge Disposition:  [place]  Per UR Regulation:  Reviewed for med. necessity/level of care/duration of stay  If discussed at [place] of Stay Meetings, dates discussed:   07/24/2014    Comments:  07/24/14 Martha Boehning RN,BSN NCM [phone] [place].Martha Mcbride AWARE OF D/C & HHC ORDERS. PT-HH.RECEIVED HHC HHRN/PT/OT ORDERS, & FACE TO FACE.WILL OFFER HHC AGENCY LIST TO AWAIT CHOICE.  07/23/14 Martha Urbanek RN,BSN NCM [phone] PT CONS-AWAIT RECOMMENDATIONS.  07/20/14 Martha Iannello RN,BSN NCM [place].

## 2014-07-20 NOTE — Progress Notes (Signed)
  Echocardiogram Echocardiogram Transesophageal has been performed.  Martha Mcbride 07/20/2014, 2:49 PM

## 2014-07-20 NOTE — Progress Notes (Addendum)
    Consult note reviewed.  Discussed TEE with both daughters. Risks and benefits (damage to esophagus, bleeding, respiratory risks with mild sedation). They were concerned about long term consequences of sedation since she is already manifesting memory issues. I counseled them on minimal sedation used and short action of sedation used.   With her fevers, malaise, TEE would help definitely diagnose endocarditis and effect IV abx duration.   They are willing to proceed. She had a few spoons of pudding earlier this AM. Very small volume of food. I would be comfortable proceeding at 2pm.   Time spent with them about 30 min.   Candee Furbish, MD

## 2014-07-20 NOTE — Progress Notes (Signed)
Daughter of patient states that her mother has "ulcers" in her mouth and has not been eating well because of this. They would appreciate Boost TID, as this is what the patient has been getting at home. FYI -- pt has been on Nystatin rinse that burned her mouth and therefore, did not take this medication. Will alert MD.

## 2014-07-20 NOTE — Progress Notes (Addendum)
[facility] PROGRESS NOTE  Martha Mcbride KAJ:[id number] DOB: 1925-04-12 DOA: 07/19/2014 PCP: Simona Huh, MD  Assessment/Plan: Acute encephalopathy (toxic, 2/2 HCAP and fever) -ongoing Giardia and pinworms infection potentially contributing -no endocarditis on TEE -continue aztreonam and vanc -start pulmicort and flutter valve  Sepsis due to HCAP - Sepsis criteria met: T 100.9, RR > 22 breaths per minute, WBC 12.2, source possibly pneumonia and RLE toes necrosis - Antibiotics as noted above -will continue praziquantel as started by primary care physician and follow repeated stool cx's  - Followup on cardiology recommendations as well as vascular surgery recommendations -WBC's now WNL; will follow   Acute hypoxic respiratory failure, oxygen saturation on 2 L C 92% on admission  - secondary to PNA and acute on chronic diastolic heart failure - ABX as noted above  -lasix as recommended by cardiology -daily weights, strict intake and output, low sodium diet - sputum culture, urine legionella and strep pneumo ordered and pending  Diarrhea  -Repeat stool panel, ova and parasites  -Continue praziquantel as noted above -will start florastor   Gangrenous toes on the right foot  - Unclear etiology at this time, pulses on the right foot palpable with good perfusion  - Will follow up vascular surgery recommendations   Polymyalgia rheumatica  - Patient on prednisone and leflunomide -will continue prednisone but will hold leflunomide for now   Acid reflux  - continue Protonix   Ulcerative lesions on the tongue  - Possibly from prednisone use and leflunomide  - Continue biotene    Code Status: Full Family Communication: daughters at bedside  Disposition Plan: to be determine    Consultants:  Vascular surgery  Cardiology   Procedures:  TEE: no evidence of endocarditis, positive AS (moderate) and preserved EF 60-65%.    Previous 2-D echo (06/03/14): demonstrated  severe LVH and high ventricular filling pressure, which would be consistent with diastolic dysfucntion  Antibiotics:  Aztreonam  Vancomycin   HPI/Subjective: Still with some SOB and general malaise. Patient with intermittent cough. S/p TEE (moderate AS, no endocarditis). Afebrile this morning.  Objective: Filed Vitals:   07/20/14 1615  BP: 121/53  Pulse: 86  Temp: 97.9 F (36.6 C)  Resp: 24    Intake/Output Summary (Last 24 hours) at 07/20/14 1642 Last data filed at 07/20/14 1444  Gross per 24 hour  Intake   1230 ml  Output   2620 ml  Net  -1390 ml   Filed Weights   07/20/14 0130 07/20/14 0548  Weight: 68.584 kg (151 lb 3.2 oz) 67.631 kg (149 lb 1.6 oz)    Exam:   General:  Frail, elderly; still complaining of SOB, general malaise   Cardiovascular: positive SEM, no rubs or gallops  Respiratory: positive rhonchi, slight exp wheezing and bibasilar crackles   Abdomen: soft, ND, positive BS  Musculoskeletal: RLE with necrotic toes and bilateral trace of edema   Data Reviewed: Basic Metabolic Panel:  Recent Labs Lab 07/19/14 2039 07/20/14 0215  NA 132* 134*  K 4.3 4.3  CL 97 99  CO2 20 22  GLUCOSE 192* 148*  BUN 14 12  CREATININE 0.62 0.60  CALCIUM 8.5 8.5   Liver Function Tests:  Recent Labs Lab 07/19/14 2039  AST 13  ALT 15  ALKPHOS 100  BILITOT 0.7  PROT 5.9*  ALBUMIN 2.7*   CBC:  Recent Labs Lab 07/19/14 2222 07/20/14 0215  WBC 12.2* 12.5*  NEUTROABS 7.0  --   HGB 11.6* 11.9*  HCT 33.6*  35.0*  MCV 84.4 84.1  PLT 226 228   Cardiac Enzymes:  Recent Labs Lab 07/19/14 2039  CKTOTAL 16   BNP (last 3 results)  Recent Labs  06/02/14 1954 07/19/14 2039  PROBNP 622.2* 1467.0*   CBG: No results found for this basename: GLUCAP,  in the last 168 hours  Recent Results (from the past 240 hour(s))  CULTURE, EXPECTORATED SPUTUM-ASSESSMENT     Status: None   Collection Time    07/20/14  2:09 AM      Result Value Ref Range  Status   Specimen Description Expect. Sput   Final   Special Requests NONE   Final   Sputum evaluation     Final   Value: THIS SPECIMEN IS ACCEPTABLE. RESPIRATORY CULTURE REPORT TO FOLLOW.   Report Status 07/20/2014 FINAL   Final     Studies: Dg Chest 2 View  07/19/2014   CLINICAL DATA:  Cough and weakness.  History of breast cancer.  EXAM: CHEST  2 VIEW  COMPARISON:  06/02/2014.  FINDINGS: Poor inspiration. Mildly enlarged cardiac silhouette with an interval decrease in size. Interval patchy airspace opacity throughout the majority of both lungs. No definite pleural fluid. Diffuse osteopenia. Stable thoracolumbar spine compression deformity and atheromatous arterial calcifications.  IMPRESSION: Poor inspiration with interval diffuse bilateral pneumonia or alveolar edema.   Electronically Signed   By: Enrique Sack M.D.   On: 07/19/2014 21:30   Dg Foot Complete Right  07/19/2014   CLINICAL DATA:  Patient reports the second and third post appeared darkened and necrosis, started 1 week ago.  EXAM: RIGHT FOOT COMPLETE - 3+ VIEW  COMPARISON:  None.  FINDINGS: There is no evidence of fracture or dislocation. There is no evidence of osteomyelitis. There is soft tissue thinning in the distal second digit.  IMPRESSION: No evidence of osteomyelitis. Soft tissue thinning in the distal second digit.   Electronically Signed   By: Abelardo Diesel M.D.   On: 07/19/2014 21:26    Scheduled Meds: . antiseptic oral rinse  7 mL Mouth Rinse BID  . aspirin  81 mg Oral Daily  . aztreonam  2 g Intravenous Q8H  . budesonide (PULMICORT) nebulizer solution  0.25 mg Nebulization BID  . enoxaparin (LOVENOX) injection  40 mg Subcutaneous Q24H  . folic acid  1 mg Oral Daily  . pantoprazole  40 mg Oral Daily  . polyvinyl alcohol  1 drop Both Eyes q morning - 10a  . [START ON 07/21/2014] praziquantel  600 mg Oral QODAY  . predniSONE  5 mg Oral Q breakfast  . sodium chloride  3 mL Intravenous Q12H  . sodium chloride  3 mL  Intravenous Q12H  . vancomycin  500 mg Intravenous Q12H   Continuous Infusions:   Active Problems:   Aortic stenosis   Fever    Time spent: 30 minutes (more than 50% of time dedicated to face to face discussion of her condition, treatment and counseling/coordinating care)    Barton Dubois  [facility] Pager [phone]. If 7PM-7AM, please contact night-coverage at [email or web address], password [facility] 07/20/2014, 4:42 PM  LOS: 1 day

## 2014-07-20 NOTE — Progress Notes (Signed)
Pharmacy paged PA about pt's abx- Pt placed on Lucianne Lei and Zosyn per pharmacy for bacteremia/?HCAP/endocaditis, however, pt with PCN allergy.  Placed, instead, on Vanc and Azactam per pharmacy.   Lacy Duverney [facility]

## 2014-07-20 NOTE — Consult Note (Addendum)
[facility] [place] [facility] HISTORY AND PHYSICAL   History of Present Illness:  Patient is a 80 y.o. year old female who presents for evaluation of gangrene of toes.  She has intermittently had fevers as high as  101 at home.  Pt starting having pain and discoloration of toes right foot approximately 1 week ago.  She was seen by primary MD and Rheumatology and told to do warm soaks of foot.  The toes became progressively black and painful.  Pt admitted today with fever and cardiac murmur for possible endocarditis.  She is also currently being treated for intestinal parasite..  Other medical problems include polymyalgia for which she is on steroids.  She was started on Vancomycin.  Past Medical History  Diagnosis Date  . Arthritis   . Polymyalgia   . Acid reflux   . Immune deficiency disorder     autoimmune disorder - polymyalgia - on prednisone  . Breast cancer     Treated with lumpectomy and radiation  . Giardia   . Pinworms     Past Surgical History  Procedure Laterality Date  . Breast lumpectomy    . Partial hysterectomy    . Tonsillectomy      Social History History  Substance Use Topics  . Smoking status: Never Smoker   . Smokeless tobacco: Not on file  . Alcohol Use: No    Family History Family History  Problem Relation Age of Onset  . Stroke Mother     Allergies  Allergies  Allergen Reactions  . Doxycycline Nausea And Vomiting  . Other Nausea And Vomiting and Other (See Comments)    Anesthesia makes her very sleepy and makes it hard for her to come out of it.   Marland Kitchen Penicillins Hives and Nausea And Vomiting  . Sulfa Antibiotics Nausea And Vomiting     Current Facility-Administered Medications  Medication Dose Route Frequency Provider Last Rate Last Dose  . 0.9 %  sodium chloride infusion  250 mL Intravenous PRN Theodis Blaze, MD      . antiseptic oral rinse (BIOTENE) solution 15 mL  15 mL Mouth Rinse PRN Theodis Blaze, MD      . antiseptic oral  rinse (CPC / CETYLPYRIDINIUM CHLORIDE 0.05%) solution 7 mL  7 mL Mouth Rinse BID Theodis Blaze, MD      . enoxaparin (LOVENOX) injection 40 mg  40 mg Subcutaneous Q24H Theodis Blaze, MD      . folic acid (FOLVITE) tablet 1 mg  1 mg Oral Daily Theodis Blaze, MD      . HYDROcodone-acetaminophen (NORCO/VICODIN) 5-325 MG per tablet 1-2 tablet  1-2 tablet Oral Q4H PRN Theodis Blaze, MD      . ondansetron [facility]) tablet 4 mg  4 mg Oral Q6H PRN Theodis Blaze, MD       Or  . ondansetron [facility]) injection 4 mg  4 mg Intravenous Q6H PRN Theodis Blaze, MD      . pantoprazole (PROTONIX) EC tablet 40 mg  40 mg Oral Daily Theodis Blaze, MD      . polyvinyl alcohol (LIQUIFILM TEARS) 1.4 % ophthalmic solution 1 drop  1 drop Both Eyes q morning - 10a Theodis Blaze, MD      . Derrill Memo ON 07/21/2014] praziquantel (BILTRICIDE) tablet 600 mg  600 mg Oral QODAY Theodis Blaze, MD      . predniSONE (DELTASONE) tablet 5 mg  5 mg Oral Q  breakfast Theodis Blaze, MD      . sodium chloride 0.9 % injection 3 mL  3 mL Intravenous Q12H Theodis Blaze, MD      . sodium chloride 0.9 % injection 3 mL  3 mL Intravenous Q12H Theodis Blaze, MD      . sodium chloride 0.9 % injection 3 mL  3 mL Intravenous PRN Theodis Blaze, MD      . vancomycin (VANCOCIN) 500 mg in sodium chloride 0.9 % 100 mL IVPB  500 mg Intravenous Q12H Dorrene German, RPH      . vancomycin (VANCOCIN) IVPB 1000 mg/200 mL premix  1,000 mg Intravenous Once Dorrene German, RPH        ROS:   General:  + weight loss, +Fever, +chills  HEENT: No recent headaches, no nasal bleeding, no visual changes, no sore throat  Neurologic: No dizziness, blackouts, seizures. No recent symptoms of stroke or mini- stroke. No recent episodes of slurred speech, or temporary blindness.  Cardiac: No recent episodes of chest pain/pressure, no shortness of breath at rest.  + shortness of breath with exertion.  Denies history of atrial fibrillation or irregular  heartbeat  Vascular: No history of rest pain in feet.  No history of claudication.  No history of non-healing ulcer, No history of DVT   Pulmonary: No home oxygen, + productive cough, no hemoptysis,  No asthma or wheezing  Musculoskeletal:  [x ] Arthritis, [ ]  Low back pain,  [ ]  Joint pain  Hematologic:No history of hypercoagulable state.  No history of easy bleeding.  No history of anemia  Gastrointestinal: No hematochezia or melena,  No gastroesophageal reflux, no trouble swallowing  Urinary: [ ]  chronic Kidney disease, [ ]  on HD - [ ]  MWF or [ ]  TTHS, [ ]  Burning with urination, [ ]  Frequent urination, [ ]  Difficulty urinating;   Skin: No rashes  Psychological: No history of anxiety,  No history of depression   Physical Examination  Filed Vitals:   07/19/14 2230 07/19/14 2240 07/19/14 2258 07/20/14 0130  BP:   140/65 150/69  Pulse: 95 94 94 99  Temp:    98.8 F (37.1 C)  TempSrc:    Oral  Resp: 24 22 22 24   Height:    5\' 1"  (1.549 m)  Weight:    151 lb 3.2 oz (68.584 kg)  SpO2: 95% 95% 93% 96%    Body mass index is 28.58 kg/(m^2).  General:  Alert and oriented, no acute distress HEENT: Normal Neck: No JVD Pulmonary: few basilar crackles Cardiac: Regularly Irregular Abdomen: Soft, non-tender, non-distended, no mass Skin: No rash, dry gangrene tip of toes 2 3 right foot Extremity Pulses:  2+ radial, brachial, femoral, dorsalis pedis bilaterally Musculoskeletal: No deformity or edema  Neurologic: Upper and lower extremity motor 5/5 and symmetric  DATA:  EKG sinus tachycardia  Chest xray possible interstitial pneumonia  CBC    Component Value Date/Time   WBC 12.5* 07/20/2014 0215   RBC 4.16 07/20/2014 0215   HGB 11.9* 07/20/2014 0215   HCT 35.0* 07/20/2014 0215   PLT 228 07/20/2014 0215   MCV 84.1 07/20/2014 0215   MCH 28.6 07/20/2014 0215   MCHC 34.0 07/20/2014 0215   RDW 16.3* 07/20/2014 0215   LYMPHSABS 2.4 07/19/2014 2222   MONOABS 0.9 07/19/2014  2222   EOSABS 1.8* 07/19/2014 2222   BASOSABS 0.1 07/19/2014 2222    BMET    Component Value Date/Time   NA  134* 07/20/2014 0215   K 4.3 07/20/2014 0215   CL 99 07/20/2014 0215   CO2 22 07/20/2014 0215   GLUCOSE 148* 07/20/2014 0215   BUN 12 07/20/2014 0215   CREATININE 0.60 07/20/2014 0215   CALCIUM 8.5 07/20/2014 0215   GFRNONAA 80* 07/20/2014 0215   GFRAA >90 07/20/2014 0215       ASSESSMENT:  Dry gangrene of toes right foot, large vessels patent, 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  No aneurysm clinically on exam or on CT scan abdomen 2013.  Since unilateral most likely digital embolus from right leg arterial tree.  Since large vessel pulses are intact would recommend anti platelet agent only for now.  Has some pain occasionally from toes but as long as this is not progressive would observe for now and only reserve amputation if progressive changes or worsening pain.  If this occurs she will need transfer to [facility]  Plan discussed with pt daughters at bedside.    PLAN:  See above  Ruta Hinds, MD [facility] [place] Office: [phone] Pager: [phone]

## 2014-07-20 NOTE — Progress Notes (Signed)
Report called to Sharyn Lull. Pt's O2 saturation is fluctuating from 89%-93% on 2L. Pt is encouraged to take deep breaths through her nose. Pt. tolerated TEE procedure well otherwise.

## 2014-07-20 NOTE — Progress Notes (Signed)
Writer alerted [facility] to p/u pt for transport to [facility] for TEE at 2pm this afternoon.  Arlyss Gandy was ordered for 12 to 1230 today.

## 2014-07-20 NOTE — Progress Notes (Signed)
Pt left with [facility] to go to [facility] for EEG. Daughters of pt present and following her to [facility].

## 2014-07-20 NOTE — Consult Note (Cosign Needed)
CARDIOLOGY CONSULT NOTE   Patient ID: Martha Mcbride MRN: [id number], DOB/AGE: Apr 09, 1925   Admit date: 07/19/2014 Date of Consult: 07/20/2014   Primary Physician: No primary provider on file. Primary Cardiologist: Stanford Breed  CC: Fevers, new CHF symptoms  Problem List  Past Medical History  Diagnosis Date  . Arthritis   . Polymyalgia   . Acid reflux   . Immune deficiency disorder     autoimmune disorder - polymyalgia - on prednisone  . Breast cancer     Treated with lumpectomy and radiation  . Giardia   . Pinworms     Past Surgical History  Procedure Laterality Date  . Breast lumpectomy    . Partial hysterectomy    . Tonsillectomy       Allergies  Allergies  Allergen Reactions  . Doxycycline Nausea And Vomiting  . Other Nausea And Vomiting and Other (See Comments)    Anesthesia makes her very sleepy and makes it hard for her to come out of it.   Marland Kitchen Penicillins Hives and Nausea And Vomiting  . Sulfa Antibiotics Nausea And Vomiting    HPI  The patient is an 80 year old woman with a history of PMR on prednisone/leflunomide and recent chest pain now s/p negative Lexiscan who is brought into the [facility] ED by her daughters out of concern for an ischemic toe. About 6 weeks ago the patien was extremely active, living alone and going regularly on nature trips and church every Sunday. Over the past six weeks, though, she has suffered a significant decline in her functional status to the point where she is now essentially bedbound. She was admitted to [facility] last month for chest pain where TTE revealed normal LVEF, MAC with moderate MS. Nuclear stress test was negative for ischemia. Since then she has had episodic fevers/chills. Of note, about 8 weeks ago she developed hives and was given a prednisone taper (starting 60mg ). She was diagnosed with GI parasites (giardia, roundworm) and is currently on praziquantel.  Over the last week her daughters report necrosis of 2 toes on her  right foot to the point where the tips are now black. This is what prompted the admission. Given her history of fevers and new possible embolic phenomenon the ED team was concerned for subacute bacterial endocarditis so cardiology was consulted for additional evaluation and management.  Inpatient Medications  . enoxaparin (LOVENOX) injection  40 mg Subcutaneous Q24H  . folic acid  1 mg Oral Daily  . hydroxypropyl methylcellulose / hypromellose  1 drop Both Eyes q morning - 10a  . pantoprazole  40 mg Oral Daily  . praziquantel  600 mg Oral QODAY  . predniSONE  5 mg Oral Q breakfast  . sodium chloride  3 mL Intravenous Q12H  . sodium chloride  3 mL Intravenous Q12H    Family History Family History  Problem Relation Age of Onset  . Stroke Mother      Social History History   Social History  . Marital Status: Widowed    Spouse Name: N/A    Number of Children: 4  . Years of Education: N/A   Occupational History  . Not on file.   Social History Main Topics  . Smoking status: Never Smoker   . Smokeless tobacco: Not on file  . Alcohol Use: No  . Drug Use: No  . Sexual Activity: Not on file   Other Topics Concern  . Not on file   Social History Narrative  .  No narrative on file     Review of Systems  General:  +Fever/chills, no night sweats Cardiovascular:  No current chest pain, orthopnea, palpitations, paroxysmal nocturnal dyspnea. +dyspnea on exertion, edema Dermatological: No rash, lesions/masses Respiratory: No cough, +dyspnea Urologic: No hematuria, dysuria Abdominal:   +Nausea, no vomiting, diarrhea, bright red blood per rectum, melena, or hematemesis Neurologic:  No visual changes,changes in mental status. +significant weakness. All other systems reviewed and are otherwise negative except as noted above.  Physical Exam  Blood pressure 140/65, pulse 94, temperature 100.1 F (37.8 C), temperature source Rectal, resp. rate 22, SpO2 93.00%.  General: Pleasant,  NAD Psych: Normal affect, somnolent. Neuro: Alert and oriented X 3. Moves all extremities spontaneously. HEENT: Normal. Direct ophthalmoscopy reveals no embolic phenomena but limited view Neck: Supple without bruits, JVD mildly elevated to 10cm Lungs:  Bibasilar crackles about 1/3 up both lung fields. Heart: RRR no s3, s4, +2/6 SEM at base. No clear diastolic murmur observed Abdomen: Soft, non-tender, non-distended, BS + x 4.  Extremities: No clubbing, cyanosis. 1+ BLE edema L>R. Right 2nd and third toes are necrotic. Left great toe is erythematous. No Osler nodes, Janeway lesions, or splinter hemorrhages observed.  Labs   Recent Labs  07/19/14 2039  CKTOTAL 16   Lab Results  Component Value Date   WBC 12.2* 07/19/2014   HGB 11.6* 07/19/2014   HCT 33.6* 07/19/2014   MCV 84.4 07/19/2014   PLT 226 07/19/2014    Recent Labs Lab 07/19/14 2039  NA 132*  K 4.3  CL 97  CO2 20  BUN 14  CREATININE 0.62  CALCIUM 8.5  PROT 5.9*  BILITOT 0.7  ALKPHOS 100  ALT 15  AST 13  GLUCOSE 192*   No results found for this basename: CHOL, HDL, LDLCALC, TRIG   No results found for this basename: DDIMER    Radiology/Studies  Dg Chest 2 View  07/19/2014   CLINICAL DATA:  Cough and weakness.  History of breast cancer.  EXAM: CHEST  2 VIEW  COMPARISON:  06/02/2014.  FINDINGS: Poor inspiration. Mildly enlarged cardiac silhouette with an interval decrease in size. Interval patchy airspace opacity throughout the majority of both lungs. No definite pleural fluid. Diffuse osteopenia. Stable thoracolumbar spine compression deformity and atheromatous arterial calcifications.  IMPRESSION: Poor inspiration with interval diffuse bilateral pneumonia or alveolar edema.   Electronically Signed   By: Enrique Sack M.D.   On: 07/19/2014 21:30   Dg Foot Complete Right  07/19/2014   CLINICAL DATA:  Patient reports the second and third post appeared darkened and necrosis, started 1 week ago.  EXAM: RIGHT FOOT  COMPLETE - 3+ VIEW  COMPARISON:  None.  FINDINGS: There is no evidence of fracture or dislocation. There is no evidence of osteomyelitis. There is soft tissue thinning in the distal second digit.  IMPRESSION: No evidence of osteomyelitis. Soft tissue thinning in the distal second digit.   Electronically Signed   By: Abelardo Diesel M.D.   On: 07/19/2014 21:26    ECG Sinus with PACs. No evidence of heart block or conduction system disease.   ASSESSMENT AND PLAN 80 year old previously active woman with profound functional decline over 6 weeks and new toe gangrene. We are asked to comment on the possibility of endocarditis. While this is a possibility, at this point she does not meet [facility] criteria for endocarditis. There was no documented vegetation on TTE one month ago and she does not have any positive blood cultures. She  has possible embolism to toes causing gangrene but no other vascular or immunologic phenomena. At best right now she has 2 minor criteria (fever, embolic). However, would continue to gather data about the possibility of subacute bacterial endocarditis. -Continue to monitor fever curve -Please obtain daily blood cultures x 2. Also please inform lab to monitor for slower growing HACEK organisms as well as Coxiella, Please obtain first set before administration of broad spectrum abx. -Can consider TEE in the AM to better define valvular anatomy and exclude vegetation with more specificity.  -She would benefit from ID consultation for additional management of her presumed infections. -Would probably otherwise continue workup for fever of unknown origin  #New onset heart failure: She is mildly volume up on examination. She had normal function 4 wks ago. Her CHF may be in the setting of tachycardia and her moderate MS, but given the clinical change please obtain a limited TTE to evaluate for valvular function and LVEF assessment. -Please start Lasix IV 40mg  x 1 -Can start her on betablocker  once more clinically stable. -Please check TSH.  Thank you for this interesting consult. We will continue to follow with you   Signed, Raliegh Ip, MD MPH 07/20/2014, 1:49 AM

## 2014-07-20 NOTE — Progress Notes (Signed)
  CARE MANAGEMENT ED NOTE 07/20/2014  Patient:  Martha Mcbride, Martha Mcbride   Account Number:  [id number]  Date Initiated:  07/20/2014  Documentation initiated by:  Livia Snellen  Subjective/Objective Assessment:   Patient presents to Ed with general physical decline, increased fatigue and decreased po intake.     Subjective/Objective Assessment Detail:   Patient with pmhx of arthritis, polymyalgia, breast cancer, breast cancer, autoimmune disorder, giardia, pinworms     Action/Plan:   Action/Plan Detail:   Anticipated DC Date:       Status Recommendation to Physician:   Result of Recommendation:    Other ED [place]  Other  PCP issues    Choice offered to / List presented to:  C-4 Adult Children          Status of service:  Completed, signed off  ED Comments:   ED Comments Detail:  EDCM spoke to patient and her daughter at bedside. Patient's daughter reports she lives about one mile away form patient.  Patient lives alone.  Patient's daughter reports patient has a cane, shower chair and safety rails in her bathroom at home.  Patient's daughter reports the patient is very hard of hearing and her batteries to her hearing aids are running low.  EDCM provided patient's daughter container for patient's hearing aids.  Patient does not have any home health services at this time. Patient's duaghter confirms patient's pcp is Dr. Gaynelle Arabian.  System updated.  EDCM provided patient's daughter with list of home health agencies in [facility].  EDCM explained with home health the patient may receive a visiting RN, PT, OT and social worker and agency of their choice has 24-48 hours to contact the patient.  Patinet's daughter thankful for services.  No further EDCM needs at this time.

## 2014-07-20 NOTE — Progress Notes (Signed)
Family would like pt to have Probiotic.  Family wants to discuss Luflutimide before it is reordered.

## 2014-07-20 NOTE — Interval H&P Note (Signed)
History and Physical Interval Note:  07/20/2014 1:57 PM  Martha Mcbride  has presented today for surgery, with the diagnosis of endocarditis  The various methods of treatment have been discussed with the patient and family. After consideration of risks, benefits and other options for treatment, the patient has consented to  Procedure(s): TRANSESOPHAGEAL ECHOCARDIOGRAM (TEE) (N/A) as a surgical intervention .  The patient's history has been reviewed, patient examined, no change in status, stable for surgery.  I have reviewed the patient's chart and labs.  Questions were answered to the patient's satisfaction.     SKAINS, MARK

## 2014-07-21 DIAGNOSIS — I5033 Acute on chronic diastolic (congestive) heart failure: Secondary | ICD-10-CM

## 2014-07-21 LAB — CBC
HEMATOCRIT: 34.7 % — AB (ref 36.0–46.0)
HEMOGLOBIN: 11.8 g/dL — AB (ref 12.0–15.0)
MCH: 29.1 pg (ref 26.0–34.0)
MCHC: 34 g/dL (ref 30.0–36.0)
MCV: 85.5 fL (ref 78.0–100.0)
Platelets: 220 10*3/uL (ref 150–400)
RBC: 4.06 MIL/uL (ref 3.87–5.11)
RDW: 16.6 % — ABNORMAL HIGH (ref 11.5–15.5)
WBC: 11.5 10*3/uL — AB (ref 4.0–10.5)

## 2014-07-21 LAB — BASIC METABOLIC PANEL
Anion gap: 12 (ref 5–15)
BUN: 12 mg/dL (ref 6–23)
CHLORIDE: 97 meq/L (ref 96–112)
CO2: 26 meq/L (ref 19–32)
Calcium: 8.2 mg/dL — ABNORMAL LOW (ref 8.4–10.5)
Creatinine, Ser: 0.62 mg/dL (ref 0.50–1.10)
GFR calc Af Amer: >90 mL/min (ref >90)
GFR calc non Af Amer: 79 mL/min — ABNORMAL LOW (ref >90)
GLUCOSE: 108 mg/dL — AB (ref 70–99)
POTASSIUM: 3.5 meq/L — AB (ref 3.7–5.3)
SODIUM: 135 meq/L — AB (ref 137–147)

## 2014-07-21 LAB — URINE CULTURE: Colony Count: 3000

## 2014-07-21 MED ORDER — ENSURE COMPLETE PO LIQD
237.0000 mL | Freq: Once | ORAL | Status: AC
  Administered 2014-07-21: 237 mL via ORAL

## 2014-07-21 MED ORDER — POTASSIUM CHLORIDE CRYS ER 20 MEQ PO TBCR
40.0000 meq | EXTENDED_RELEASE_TABLET | Freq: Every day | ORAL | Status: AC
  Administered 2014-07-21 – 2014-07-23 (×3): 40 meq via ORAL
  Filled 2014-07-21 (×3): qty 2

## 2014-07-21 MED ORDER — LORATADINE 10 MG PO TABS
10.0000 mg | ORAL_TABLET | Freq: Every day | ORAL | Status: DC
  Administered 2014-07-21 – 2014-07-24 (×4): 10 mg via ORAL
  Filled 2014-07-21 (×4): qty 1

## 2014-07-21 MED ORDER — ZINC OXIDE 20 % EX OINT
TOPICAL_OINTMENT | CUTANEOUS | Status: DC | PRN
  Administered 2014-07-21 – 2014-07-22 (×2): via TOPICAL
  Filled 2014-07-21: qty 28.35

## 2014-07-21 NOTE — Progress Notes (Signed)
Patient ID: Martha Mcbride, female   DOB: 14-Jan-1925, 80 y.o.   MRN: [id number]     Subjective:    SOB mildly improved  Objective:   Temp:  [97.9 F (36.6 C)-98.5 F (36.9 C)] 98.5 F (36.9 C) (10/24 0553) Pulse Rate:  [78-99] 99 (10/24 0553) Resp:  [15-36] 24 (10/24 0553) BP: (100-194)/(46-116) 115/46 mmHg (10/24 0553) SpO2:  [88 %-99 %] 97 % (10/24 0553) Weight:  [145 lb 14.4 oz (66.18 kg)] 145 lb 14.4 oz (66.18 kg) (10/24 0525) Last BM Date: 07/20/14  Filed Weights   07/20/14 0130 07/20/14 0548 07/21/14 0525  Weight: 151 lb 3.2 oz (68.584 kg) 149 lb 1.6 oz (67.631 kg) 145 lb 14.4 oz (66.18 kg)    Intake/Output Summary (Last 24 hours) at 07/21/14 0755 Last data filed at 07/21/14 0646  Gross per 24 hour  Intake   1280 ml  Output   2750 ml  Net  -1470 ml    Telemetry: sinus tach with PACs  Exam:  General: NAD  Resp: crackles bilateral bases  Cardiac: regular, rate 174, 3/6 systolic murmur right upper sternal border, JVD elevated  GI: abdomen soft, NT, ND  MSK: trace bilateral edema  Neuro: no focal deficits  Psych: appropriate affect  Lab Results:  Basic Metabolic Panel:  Recent Labs Lab 07/19/14 2039 07/20/14 0215 07/21/14 0528  NA 132* 134* 135*  K 4.3 4.3 3.5*  CL 97 99 97  CO2 20 22 26   GLUCOSE 192* 148* 108*  BUN 14 12 12   CREATININE 0.62 0.60 0.62  CALCIUM 8.5 8.5 8.2*    Liver Function Tests:  Recent Labs Lab 07/19/14 2039  AST 13  ALT 15  ALKPHOS 100  BILITOT 0.7  PROT 5.9*  ALBUMIN 2.7*    CBC:  Recent Labs Lab 07/19/14 2222 07/20/14 0215 07/21/14 0528  WBC 12.2* 12.5* 11.5*  HGB 11.6* 11.9* 11.8*  HCT 33.6* 35.0* 34.7*  MCV 84.4 84.1 85.5  PLT 226 228 220    Cardiac Enzymes:  Recent Labs Lab 07/19/14 2039  CKTOTAL 16    BNP:  Recent Labs  06/02/14 1954 07/19/14 2039  PROBNP 622.2* 1467.0*    Coagulation: No results found for this basename: INR,  in the last 168 hours  ECG:   Medications:     Scheduled Medications: . antiseptic oral rinse  7 mL Mouth Rinse BID  . aspirin  81 mg Oral Daily  . aztreonam  2 g Intravenous Q8H  . budesonide (PULMICORT) nebulizer solution  0.25 mg Nebulization BID  . enoxaparin (LOVENOX) injection  40 mg Subcutaneous Q24H  . folic acid  1 mg Oral Daily  . furosemide  20 mg Intravenous Q12H  . pantoprazole  40 mg Oral Daily  . polyvinyl alcohol  1 drop Both Eyes q morning - 10a  . potassium chloride  40 mEq Oral Daily  . praziquantel  600 mg Oral QODAY  . predniSONE  5 mg Oral Q breakfast  . saccharomyces boulardii  250 mg Oral BID  . sodium chloride  3 mL Intravenous Q12H  . sodium chloride  3 mL Intravenous Q12H  . vancomycin  500 mg Intravenous Q12H     Infusions:     PRN Medications:  sodium chloride, antiseptic oral rinse, dextromethorphan, HYDROcodone-acetaminophen, ondansetron (ZOFRAN) IV, ondansetron, sodium chloride     Assessment/Plan    1. Fevers - managed by primary team. TEE yesterday to evaluate for endocarditis with normal VLEF 60-65%, moderate AS, mild MS,  no evidence of vegetation - abx per primary team  2. Acute on chronic diastolic HF - echo 0/7680 LVEF 65-70%, evidence of elevated LA pressure. Also with moderate AS and mild to mod MS likely contributing - negative 1.4 liters yesterday, total negative 3 liters since admission. Cr remains stable, she is on lasix 20mg  IV bid - continue IV lasix today, potentially change to oral tomorrow.   3. Toe gangrene -management per vascular  4. Hypokalemia - secondary to diuretics - please keep K at 4 and Mg at 2  5. Sinus tachycardia - elevated heart rates at times, rhythm is sinus with PACs related to her ongoing infections. No arrhythmias noted.    Carlyle Dolly, M.D.

## 2014-07-21 NOTE — Progress Notes (Signed)
[facility] PROGRESS NOTE  Martha Mcbride ZOX:[id number] DOB: [birth year] DOA: 07/19/2014 PCP: Simona Huh, MD  Assessment/Plan: Acute encephalopathy (toxic, 2/2 HCAP and fever) -ongoing Giardia and pinworms infection potentially contributing -no endocarditis on TEE -continue aztreonam and vanc -start pulmicort and flutter valve  Sepsis due to HCAP - Sepsis criteria met on admission: T 100.9, RR > 22 breaths per minute, WBC 12.2, source possibly pneumonia and RLE toes necrosis - Antibiotics as noted above -will continue praziquantel as started by primary care physician and follow repeated stool cx's  -GI pathogen panel negative - Followup on cardiology recommendations as well as vascular surgery recommendations -WBC's trending down/almost WNL; will follow   Acute hypoxic respiratory failure, oxygen saturation on 2 L C 92% on admission  - secondary to PNA and acute on chronic diastolic heart failure - ABX as noted above  -lasix as recommended by cardiology -daily weights, strict intake and output, low sodium diet - sputum culture, urine legionella and strep pneumo ordered and pending  Diarrhea  -Repeat stool panel, ova and parasites  -Continue praziquantel as noted above -will continue florastor   Gangrenous toes on the right foot  - Unclear etiology at this time, pulses on the right foot palpable with good perfusion  - Will follow up vascular surgery recommendations  -will ask wound care team to assess and provide rec's  Polymyalgia rheumatica  - Patient on prednisone and leflunomide -will continue prednisone but will hold leflunomide for now   Acid reflux  - continue Protonix   Ulcerative lesions on the tongue  - Possibly from prednisone use and leflunomide  - Continue biotene and oral care  Decubitus ulcer -overlay mattress, zinc oxide and frequent rotation -wound care consulted  Rhinitis: -will start claritin   Code Status: Full Family  Communication: daughters at bedside  Disposition Plan: to be determine    Consultants:  Vascular surgery  Cardiology   Procedures:  TEE: no evidence of endocarditis, positive AS (moderate) and preserved EF 60-65%.    Previous 2-D echo (06/03/14): demonstrated severe LVH and high ventricular filling pressure, which would be consistent with diastolic dysfucntion  Antibiotics:  Aztreonam  Vancomycin   HPI/Subjective: Frail, elderly; still complaining of SOB, but improved, general malaise/weakness and coughing spells with associated post-nasal drip   Objective: Filed Vitals:   07/21/14 0553  BP: 115/46  Pulse: 99  Temp: 98.5 F (36.9 C)  Resp: 24    Intake/Output Summary (Last 24 hours) at 07/21/14 1407 Last data filed at 07/21/14 1014  Gross per 24 hour  Intake   1280 ml  Output   2700 ml  Net  -1420 ml   Filed Weights   07/20/14 0130 07/20/14 0548 07/21/14 0525  Weight: 68.584 kg (151 lb 3.2 oz) 67.631 kg (149 lb 1.6 oz) 66.18 kg (145 lb 14.4 oz)    Exam:   General:  Frail, elderly; still complaining of SOB, but improved, general malaise/weakness and coughing spells with associated post-nasal drip   Cardiovascular: positive SEM, no rubs or gallops  Respiratory: positive rhonchi, slight exp wheezing and fine bibasilar crackles   Abdomen: soft, ND, positive BS  Musculoskeletal: RLE with necrotic toes and bilateral trace of edema   Data Reviewed: Basic Metabolic Panel:  Recent Labs Lab 07/19/14 2039 07/20/14 0215 07/21/14 0528  NA 132* 134* 135*  K 4.3 4.3 3.5*  CL 97 99 97  CO2 $Re'20 22 26  'lDe$ GLUCOSE 192* 148* 108*  BUN $Re'14 12 12  'GFZ$ CREATININE 0.62 0.60  0.62  CALCIUM 8.5 8.5 8.2*   Liver Function Tests:  Recent Labs Lab 07/19/14 2039  AST 13  ALT 15  ALKPHOS 100  BILITOT 0.7  PROT 5.9*  ALBUMIN 2.7*   CBC:  Recent Labs Lab 07/19/14 2222 07/20/14 0215 07/21/14 0528  WBC 12.2* 12.5* 11.5*  NEUTROABS 7.0  --   --   HGB 11.6* 11.9*  11.8*  HCT 33.6* 35.0* 34.7*  MCV 84.4 84.1 85.5  PLT 226 228 220   Cardiac Enzymes:  Recent Labs Lab 07/19/14 2039  CKTOTAL 16   BNP (last 3 results)  Recent Labs  06/02/14 1954 07/19/14 2039  PROBNP 622.2* 1467.0*    Recent Results (from the past 240 hour(s))  URINE CULTURE     Status: None   Collection Time    07/20/14  1:37 AM      Result Value Ref Range Status   Specimen Description URINE, RANDOM   Final   Special Requests NONE   Final   Culture  Setup Time     Final   Value: 07/20/2014 09:07     Performed at [facility]     Final   Value: 3,000 COLONIES/ML     Performed at [facility]   Culture     Final   Value: INSIGNIFICANT GROWTH     Performed at [facility]   Report Status 07/21/2014 FINAL   Final  CULTURE, EXPECTORATED SPUTUM-ASSESSMENT     Status: None   Collection Time    07/20/14  2:09 AM      Result Value Ref Range Status   Specimen Description Expect. Sput   Final   Special Requests NONE   Final   Sputum evaluation     Final   Value: THIS SPECIMEN IS ACCEPTABLE. RESPIRATORY CULTURE REPORT TO FOLLOW.   Report Status 07/20/2014 FINAL   Final  CULTURE, RESPIRATORY (NON-EXPECTORATED)     Status: None   Collection Time    07/20/14  2:09 AM      Result Value Ref Range Status   Specimen Description SPUTUM   Final   Special Requests NONE   Final   Gram Stain     Final   Value: RARE WBC PRESENT, PREDOMINANTLY MONONUCLEAR     NO SQUAMOUS EPITHELIAL CELLS SEEN     RARE GRAM POSITIVE COCCI     IN PAIRS IN CLUSTERS     Performed at [facility]   Culture     Final   Value: NORMAL OROPHARYNGEAL FLORA     Performed at [facility]   Report Status PENDING   Incomplete     Studies: Dg Chest 2 View  07/19/2014   CLINICAL DATA:  Cough and weakness.  History of breast cancer.  EXAM: CHEST  2 VIEW  COMPARISON:  06/02/2014.  FINDINGS: Poor inspiration. Mildly enlarged cardiac silhouette with an  interval decrease in size. Interval patchy airspace opacity throughout the majority of both lungs. No definite pleural fluid. Diffuse osteopenia. Stable thoracolumbar spine compression deformity and atheromatous arterial calcifications.  IMPRESSION: Poor inspiration with interval diffuse bilateral pneumonia or alveolar edema.   Electronically Signed   By: Enrique Sack M.D.   On: 07/19/2014 21:30   Dg Foot Complete Right  07/19/2014   CLINICAL DATA:  Patient reports the second and third post appeared darkened and necrosis, started 1 week ago.  EXAM: RIGHT FOOT COMPLETE - 3+ VIEW  COMPARISON:  None.  FINDINGS: There is no evidence of fracture or dislocation. There is no evidence of osteomyelitis. There is soft tissue thinning in the distal second digit.  IMPRESSION: No evidence of osteomyelitis. Soft tissue thinning in the distal second digit.   Electronically Signed   By: Abelardo Diesel M.D.   On: 07/19/2014 21:26    Scheduled Meds: . antiseptic oral rinse  7 mL Mouth Rinse BID  . aspirin  81 mg Oral Daily  . aztreonam  2 g Intravenous Q8H  . budesonide (PULMICORT) nebulizer solution  0.25 mg Nebulization BID  . enoxaparin (LOVENOX) injection  40 mg Subcutaneous Q24H  . folic acid  1 mg Oral Daily  . furosemide  20 mg Intravenous Q12H  . loratadine  10 mg Oral Daily  . pantoprazole  40 mg Oral Daily  . polyvinyl alcohol  1 drop Both Eyes q morning - 10a  . potassium chloride  40 mEq Oral Daily  . praziquantel  600 mg Oral QODAY  . predniSONE  5 mg Oral Q breakfast  . saccharomyces boulardii  250 mg Oral BID  . sodium chloride  3 mL Intravenous Q12H  . sodium chloride  3 mL Intravenous Q12H  . vancomycin  500 mg Intravenous Q12H   Continuous Infusions:   Active Problems:   Aortic stenosis   Fever   Acute on chronic diastolic CHF (congestive heart failure)    Time spent: 30 minutes (more than 50% of time dedicated to face to face discussion of her condition, treatment and  counseling/coordinating care)    [place], [place] [facility] Pager [phone]. If 7PM-7AM, please contact night-coverage at [email or web address], password [facility] 07/21/2014, 2:07 PM  LOS: 2 days

## 2014-07-22 DIAGNOSIS — R0602 Shortness of breath: Secondary | ICD-10-CM

## 2014-07-22 DIAGNOSIS — R509 Fever, unspecified: Secondary | ICD-10-CM

## 2014-07-22 LAB — BASIC METABOLIC PANEL
Anion gap: 11 (ref 5–15)
BUN: 11 mg/dL (ref 6–23)
CO2: 25 meq/L (ref 19–32)
CREATININE: 0.6 mg/dL (ref 0.50–1.10)
Calcium: 8.1 mg/dL — ABNORMAL LOW (ref 8.4–10.5)
Chloride: 95 mEq/L — ABNORMAL LOW (ref 96–112)
GFR calc Af Amer: >90 mL/min (ref >90)
GFR calc non Af Amer: 80 mL/min — ABNORMAL LOW (ref >90)
GLUCOSE: 112 mg/dL — AB (ref 70–99)
Potassium: 3.8 mEq/L (ref 3.7–5.3)
Sodium: 131 mEq/L — ABNORMAL LOW (ref 137–147)

## 2014-07-22 LAB — MAGNESIUM: Magnesium: 1.7 mg/dL (ref 1.5–2.5)

## 2014-07-22 LAB — CULTURE, RESPIRATORY W GRAM STAIN: Culture: NORMAL

## 2014-07-22 MED ORDER — BENZONATATE 100 MG PO CAPS
200.0000 mg | ORAL_CAPSULE | Freq: Three times a day (TID) | ORAL | Status: DC | PRN
  Administered 2014-07-22 – 2014-07-24 (×3): 200 mg via ORAL
  Filled 2014-07-22 (×3): qty 2

## 2014-07-22 MED ORDER — FUROSEMIDE 20 MG PO TABS
20.0000 mg | ORAL_TABLET | Freq: Two times a day (BID) | ORAL | Status: DC
  Administered 2014-07-22 – 2014-07-24 (×5): 20 mg via ORAL
  Filled 2014-07-22 (×5): qty 1

## 2014-07-22 MED ORDER — DIPHENHYDRAMINE HCL 50 MG/ML IJ SOLN
25.0000 mg | Freq: Once | INTRAMUSCULAR | Status: AC
  Administered 2014-07-22: 25 mg via INTRAVENOUS
  Filled 2014-07-22: qty 1

## 2014-07-22 MED ORDER — DIPHENHYDRAMINE HCL 25 MG PO CAPS
25.0000 mg | ORAL_CAPSULE | ORAL | Status: DC | PRN
  Administered 2014-07-23 (×2): 25 mg via ORAL
  Filled 2014-07-22 (×2): qty 1

## 2014-07-22 NOTE — Progress Notes (Signed)
[facility] PROGRESS NOTE  Martha Mcbride VKP:[id number] DOB: [birth year] DOA: 07/19/2014 PCP: Simona Huh, MD  Assessment/Plan: Acute encephalopathy (toxic, 2/2 HCAP and fever) -no endocarditis on TEE -continue aztreonam and vanc -Continue pulmicort and flutter valve  Sepsis due to HCAP - Sepsis criteria met on admission: T 100.9, RR > 22 breaths per minute, WBC 12.2, source possibly pneumonia and RLE toes necrosis - Antibiotics as noted above -will continue praziquantel as started by primary care physician and follow repeated stool cx's (still pending) -GI pathogen panel negative - Followup on cardiology recommendations as well as vascular surgery recommendations -WBC's trending down/almost WNL; will follow trend  Acute hypoxic respiratory failure, oxygen saturation on 2 L C 92% on admission  - secondary to PNA and acute on chronic diastolic heart failure - ABX as noted above  -lasix as recommended by cardiology -daily weights, strict intake and output, low sodium diet - sputum culture, urine legionella and strep pneumo ordered and pending -Will repeat chest x-ray in the morning and depending on results we'll narrow antibiotics  Diarrhea  -Repeat stool panel, ova and parasites  -Continue praziquantel as noted above -will continue florastor   Gangrenous toes on the right foot  - Unclear etiology at this time, pulses on the right foot palpable with good perfusion  - Will follow up vascular surgery recommendations  -will follow wound care team rec's  Polymyalgia rheumatica  - Patient on prednisone and leflunomide -will continue prednisone but will hold leflunomide for now   Acid reflux  - continue Protonix   Ulcerative lesions on the tongue  - Possibly from prednisone use and leflunomide  - Continue biotene and oral care  Decubitus ulcer -overlay mattress, zinc oxide and frequent rotation -wound care consulted; will follow any other  recommendations  Rhinitis: -will continue claritin daily   Code Status: Full Family Communication: daughters at bedside  Disposition Plan: to be determine    Consultants:  Vascular surgery  Cardiology   Procedures:  TEE: no evidence of endocarditis, positive AS (moderate) and preserved EF 60-65%.    Previous 2-D echo (06/03/14): demonstrated severe LVH and high ventricular filling pressure, which would be consistent with diastolic dysfucntion  Antibiotics:  Aztreonam  Vancomycin   HPI/Subjective: Frail, elderly; afebrile. Reports improvement in her breathing and postnasal drip symptoms. Still with intermittent coughing spells.  Objective: Filed Vitals:   07/22/14 1355  BP: 141/69  Pulse: 88  Temp: 98.3 F (36.8 C)  Resp: 22    Intake/Output Summary (Last 24 hours) at 07/22/14 1806 Last data filed at 07/22/14 1637  Gross per 24 hour  Intake    320 ml  Output   2300 ml  Net  -1980 ml   Filed Weights   07/20/14 0548 07/21/14 0525 07/22/14 0446  Weight: 67.631 kg (149 lb 1.6 oz) 66.18 kg (145 lb 14.4 oz) 65.363 kg (144 lb 1.6 oz)    Exam:   General:  Frail, elderly; still complaining of intermittent coughing spells and anorexia. Shortness of breath is improving and patient endorses less post-nasal drip   Cardiovascular: positive SEM, no rubs or gallops  Respiratory: positive rhonchi, no wheezing or frank crackles appreciated.    Abdomen: soft, ND, positive BS  Musculoskeletal: RLE with necrotic toes and bilateral trace of edema   Data Reviewed: Basic Metabolic Panel:  Recent Labs Lab 07/19/14 2039 07/20/14 0215 07/21/14 0528 07/22/14 0607  NA 132* 134* 135* 131*  K 4.3 4.3 3.5* 3.8  CL 97 99 97 95*  CO2 _0 GLUCOSE 192* 148* 108* 112*  BUN _1 CREATININE 0.62 0.60 0.62 0.60  CALCIUM 8.5 8.5 8.2* 8.1*  MG  --   --   --  1.7   Liver Function Tests:  Recent Labs Lab 07/19/14 2039  AST 13  ALT 15  ALKPHOS 100   BILITOT 0.7  PROT 5.9*  ALBUMIN 2.7*   CBC:  Recent Labs Lab 07/19/14 2222 07/20/14 0215 07/21/14 0528  WBC 12.2* 12.5* 11.5*  NEUTROABS 7.0  --   --   HGB 11.6* 11.9* 11.8*  HCT 33.6* 35.0* 34.7*  MCV 84.4 84.1 85.5  PLT 226 228 220   Cardiac Enzymes:  Recent Labs Lab 07/19/14 2039  CKTOTAL 16   BNP (last 3 results)  Recent Labs  06/02/14 1954 07/19/14 2039  PROBNP 622.2* 1467.0*    Recent Results (from the past 240 hour(s))  URINE CULTURE     Status: None   Collection Time    07/20/14  1:37 AM      Result Value Ref Range Status   Specimen Description URINE, RANDOM   Final   Special Requests NONE   Final   Culture  Setup Time     Final   Value: 07/20/2014 09:07     Performed at [facility]     Final   Value: 3,000 COLONIES/ML     Performed at [facility]   Culture     Final   Value: INSIGNIFICANT GROWTH     Performed at [facility]   Report Status 07/21/2014 FINAL   Final  CULTURE, EXPECTORATED SPUTUM-ASSESSMENT     Status: None   Collection Time    07/20/14  2:09 AM      Result Value Ref Range Status   Specimen Description Expect. Sput   Final   Special Requests NONE   Final   Sputum evaluation     Final   Value: THIS SPECIMEN IS ACCEPTABLE. RESPIRATORY CULTURE REPORT TO FOLLOW.   Report Status 07/20/2014 FINAL   Final  CULTURE, RESPIRATORY (NON-EXPECTORATED)     Status: None   Collection Time    07/20/14  2:09 AM      Result Value Ref Range Status   Specimen Description SPUTUM   Final   Special Requests NONE   Final   Gram Stain     Final   Value: RARE WBC PRESENT, PREDOMINANTLY MONONUCLEAR     NO SQUAMOUS EPITHELIAL CELLS SEEN     RARE GRAM POSITIVE COCCI     IN PAIRS IN CLUSTERS     Performed at [facility]   Culture     Final   Value: NORMAL OROPHARYNGEAL FLORA     Performed at [facility]   Report Status 07/22/2014 FINAL   Final  CULTURE, BLOOD (ROUTINE X 2)     Status:  None   Collection Time    07/20/14  2:10 AM      Result Value Ref Range Status   Specimen Description BLOOD RIGHT ANTECUBITAL   Final   Special Requests BOTTLES DRAWN AEROBIC ONLY 2ML   Final   Culture  Setup Time     Final   Value: 07/20/2014 08:58     Performed at [facility]   Culture     Final   Value:        BLOOD CULTURE RECEIVED NO GROWTH TO DATE CULTURE  WILL BE HELD FOR 5 DAYS BEFORE ISSUING A FINAL NEGATIVE REPORT     Performed at [facility]   Report Status PENDING   Incomplete  CULTURE, BLOOD (ROUTINE X 2)     Status: None   Collection Time    07/20/14  2:15 AM      Result Value Ref Range Status   Specimen Description BLOOD L HAND   Final   Special Requests BOTTLES DRAWN AEROBIC ONLY 5 ML   Final   Culture  Setup Time     Final   Value: 07/20/2014 08:58     Performed at [facility]   Culture     Final   Value:        BLOOD CULTURE RECEIVED NO GROWTH TO DATE CULTURE WILL BE HELD FOR 5 DAYS BEFORE ISSUING A FINAL NEGATIVE REPORT     Performed at [facility]   Report Status PENDING   Incomplete     Studies: No results found.  Scheduled Meds: . antiseptic oral rinse  7 mL Mouth Rinse BID  . aspirin  81 mg Oral Daily  . aztreonam  2 g Intravenous Q8H  . budesonide (PULMICORT) nebulizer solution  0.25 mg Nebulization BID  . enoxaparin (LOVENOX) injection  40 mg Subcutaneous Q24H  . folic acid  1 mg Oral Daily  . furosemide  20 mg Oral BID WC  . loratadine  10 mg Oral Daily  . pantoprazole  40 mg Oral Daily  . polyvinyl alcohol  1 drop Both Eyes q morning - 10a  . potassium chloride  40 mEq Oral Daily  . praziquantel  600 mg Oral QODAY  . predniSONE  5 mg Oral Q breakfast  . saccharomyces boulardii  250 mg Oral BID  . sodium chloride  3 mL Intravenous Q12H  . sodium chloride  3 mL Intravenous Q12H  . vancomycin  500 mg Intravenous Q12H   Continuous Infusions:   Active Problems:   Aortic stenosis   Fever   Acute on chronic  diastolic CHF (congestive heart failure)    Time spent: 30 minutes (more than 50% of time dedicated to face to face discussion of her condition, treatment and counseling/coordinating care)    [place], [place] [facility] Pager [phone]. If 7PM-7AM, please contact night-coverage at [email or web address], password [facility] 07/22/2014, 6:06 PM  LOS: 3 days

## 2014-07-22 NOTE — Progress Notes (Signed)
Patient ID: KAILENE STEINHART, female   DOB: Feb 23, 1925, 80 y.o.   MRN: [id number]     Subjective:    SOB improving  Objective:   Temp:  [97.5 F (36.4 C)-98.4 F (36.9 C)] 97.5 F (36.4 C) (10/25 0446) Pulse Rate:  [79-95] 95 (10/25 0446) Resp:  [22-24] 24 (10/25 0446) BP: (124-144)/(59-88) 140/88 mmHg (10/25 0446) SpO2:  [98 %-100 %] 99 % (10/25 0446) Weight:  [144 lb 1.6 oz (65.363 kg)] 144 lb 1.6 oz (65.363 kg) (10/25 0446) Last BM Date: 07/20/14  Filed Weights   07/20/14 0548 07/21/14 0525 07/22/14 0446  Weight: 149 lb 1.6 oz (67.631 kg) 145 lb 14.4 oz (66.18 kg) 144 lb 1.6 oz (65.363 kg)    Intake/Output Summary (Last 24 hours) at 07/22/14 0738 Last data filed at 07/22/14 0729  Gross per 24 hour  Intake    560 ml  Output   2200 ml  Net  -1640 ml    Telemetry: NSR and mild sinus tach  Exam:  General: NAD  Resp: CTAB  Cardiac: RRR, 3/6 systolic murmur RUSB, JVD 2cm below angle of jaw  ZJ:IRCVELF soft, NT, ND  MSK: no LE edema  Neuro: no focal deficits  Psych: appropriate affect  Lab Results:  Basic Metabolic Panel:  Recent Labs Lab 07/20/14 0215 07/21/14 0528 07/22/14 0607  NA 134* 135* 131*  K 4.3 3.5* 3.8  CL 99 97 95*  CO2 22 26 25   GLUCOSE 148* 108* 112*  BUN 12 12 11   CREATININE 0.60 0.62 0.60  CALCIUM 8.5 8.2* 8.1*  MG  --   --  1.7    Liver Function Tests:  Recent Labs Lab 07/19/14 2039  AST 13  ALT 15  ALKPHOS 100  BILITOT 0.7  PROT 5.9*  ALBUMIN 2.7*    CBC:  Recent Labs Lab 07/19/14 2222 07/20/14 0215 07/21/14 0528  WBC 12.2* 12.5* 11.5*  HGB 11.6* 11.9* 11.8*  HCT 33.6* 35.0* 34.7*  MCV 84.4 84.1 85.5  PLT 226 228 220    Cardiac Enzymes:  Recent Labs Lab 07/19/14 2039  CKTOTAL 16    BNP:  Recent Labs  06/02/14 1954 07/19/14 2039  PROBNP 622.2* 1467.0*    Coagulation: No results found for this basename: INR,  in the last 168 hours  ECG:   Medications:   Scheduled Medications: .  antiseptic oral rinse  7 mL Mouth Rinse BID  . aspirin  81 mg Oral Daily  . aztreonam  2 g Intravenous Q8H  . budesonide (PULMICORT) nebulizer solution  0.25 mg Nebulization BID  . enoxaparin (LOVENOX) injection  40 mg Subcutaneous Q24H  . folic acid  1 mg Oral Daily  . furosemide  20 mg Intravenous Q12H  . loratadine  10 mg Oral Daily  . pantoprazole  40 mg Oral Daily  . polyvinyl alcohol  1 drop Both Eyes q morning - 10a  . potassium chloride  40 mEq Oral Daily  . praziquantel  600 mg Oral QODAY  . predniSONE  5 mg Oral Q breakfast  . saccharomyces boulardii  250 mg Oral BID  . sodium chloride  3 mL Intravenous Q12H  . sodium chloride  3 mL Intravenous Q12H  . vancomycin  500 mg Intravenous Q12H     Infusions:     PRN Medications:  sodium chloride, antiseptic oral rinse, dextromethorphan, HYDROcodone-acetaminophen, ondansetron (ZOFRAN) IV, ondansetron, sodium chloride, zinc oxide     Assessment/Plan   1. Fevers  - managed by primary  team. TEE yesterday to evaluate for endocarditis with normal VLEF 60-65%, moderate AS, mild MS, no evidence of vegetation  - abx per primary team   2. Acute on chronic diastolic HF  - echo 01/7016 LVEF 65-70%, evidence of elevated LA pressure. Also with moderate AS and mild to mod MS likely contributing  - negative 1.2 liters yesterday, total negative 4.5 liters since admission. Cr remains stable however sodium is trending down, she is on lasix 20mg  IV bid  - change to oral lasix 20mg  bid today.   3. Toe gangrene  -management per vascular   4. Hypokalemia  - secondary to diuretics  - please keep K at 4 and Mg at 2   5. Sinus tachycardia  - elevated heart rates at times, rhythm is sinus with PACs related to her ongoing infections. No arrhythmias noted  6. HCAP - abx per primary team  7. Hyponatremia - likely due to diuretics and diarrhea. Changing to oral lasix today.         Carlyle Dolly, M.D.

## 2014-07-23 ENCOUNTER — Inpatient Hospital Stay (HOSPITAL_COMMUNITY): Payer: Medicare Other

## 2014-07-23 ENCOUNTER — Encounter (HOSPITAL_COMMUNITY): Payer: Self-pay | Admitting: Cardiology

## 2014-07-23 DIAGNOSIS — R5381 Other malaise: Secondary | ICD-10-CM

## 2014-07-23 LAB — BASIC METABOLIC PANEL
Anion gap: 14 (ref 5–15)
BUN: 12 mg/dL (ref 6–23)
CO2: 22 mEq/L (ref 19–32)
Calcium: 8.1 mg/dL — ABNORMAL LOW (ref 8.4–10.5)
Chloride: 97 mEq/L (ref 96–112)
Creatinine, Ser: 0.54 mg/dL (ref 0.50–1.10)
GFR calc Af Amer: >90 mL/min (ref >90)
GFR calc non Af Amer: 82 mL/min — ABNORMAL LOW (ref >90)
GLUCOSE: 121 mg/dL — AB (ref 70–99)
POTASSIUM: 4.6 meq/L (ref 3.7–5.3)
SODIUM: 133 meq/L — AB (ref 137–147)

## 2014-07-23 LAB — OVA AND PARASITE EXAMINATION: Ova and parasites: NONE SEEN

## 2014-07-23 MED ORDER — PHENOL 1.4 % MT LIQD
1.0000 | OROMUCOSAL | Status: DC | PRN
  Filled 2014-07-23: qty 177

## 2014-07-23 MED ORDER — COLLAGENASE 250 UNIT/GM EX OINT
TOPICAL_OINTMENT | Freq: Every day | CUTANEOUS | Status: DC
  Administered 2014-07-23 – 2014-07-24 (×2): via TOPICAL
  Filled 2014-07-23: qty 30

## 2014-07-23 MED ORDER — LEVOFLOXACIN 750 MG PO TABS: 750.0000 mg | ORAL_TABLET | Freq: Every day | ORAL | Status: DC

## 2014-07-23 MED ORDER — METOPROLOL SUCCINATE ER 25 MG PO TB24
12.5000 mg | ORAL_TABLET | Freq: Every day | ORAL | Status: DC
  Administered 2014-07-23 – 2014-07-24 (×2): 12.5 mg via ORAL
  Filled 2014-07-23 (×2): qty 1

## 2014-07-23 MED ORDER — LEVOFLOXACIN 750 MG PO TABS: 750.0000 mg | ORAL_TABLET | ORAL | Status: DC

## 2014-07-23 NOTE — Progress Notes (Signed)
Patient had 10 beats of SVT on heart monitor. Pt asymptomatic, resting. VSS 98.1, 93, 22, 97%2L 144/70. K. Schorr, NP notified. Will continue to monitor. Wendee Beavers [place]

## 2014-07-23 NOTE — Progress Notes (Signed)
[facility] PROGRESS NOTE  Martha Mcbride FHL:[id number] DOB: November 07, 1924 DOA: 07/19/2014 PCP: Simona Huh, MD  Assessment/Plan: Acute encephalopathy (toxic, 2/2 HCAP and fever) -no endocarditis on TEE; blood cx's neg -aztreonam and vanc discontinue and antibiotic treatment switch to levaquin PO -Continue pulmicort X 1 more day and continue flutter valve  Sepsis due to HCAP - Sepsis criteria met on admission: T 100.9, RR > 22 breaths per minute, WBC 12.2, Sepsis source possibly pneumonia and RLE toes necrosis, +/- giardia/pinworms infection - Antibiotics switch to levaquin PO (dose adjusted for Cr clearance)  -will continue praziquantel as started by primary care physician, plus subsequent use of flagyl. Follow repeated stool cx's (still pending) -GI pathogen panel negative; stool cx still pending -Followup on cardiology recommendations as well as vascular surgery recommendations -WBC's trending down/almost WNL; will follow trend  Acute hypoxic respiratory failure, oxygen saturation on 2 L C 92% on admission  - secondary to PNA and acute on chronic diastolic heart failure - ABX as noted above  -lasix as recommended by cardiology (now by mouth $Remove'20mg'COTofLS$  BID) -daily weights, strict intake and output, low sodium diet -sputum culture (consistent with oropharyngeal microorganism); urine legionella and strep pneumo ordered and pending -CXR with improved vascular congestion and infiltrates; will narrow Abx's  Diarrhea  -Repeat stool panel, ova and parasites  -Continue praziquantel as noted above -will continue florastor   Gangrenous toes on the right foot  -Unclear etiology at this time, pulses on the right foot palpable with good perfusion  -Will follow up vascular surgery recommendations  -will follow wound care team rec's  Polymyalgia rheumatica  - Patient on prednisone and leflunomide -will continue prednisone but will hold leflunomide for now   Acid reflux  - continue  Protonix   Ulcerative lesions on the tongue  - Possibly from prednisone use and leflunomide  - Continue biotene and oral care  Decubitus ulcer -overlay mattress, zinc oxide and frequent rotation -wound care consulted; will follow any other recommendations  Rhinitis: -will continue claritin daily -due to sore throat and mild erythema, will start chloraseptic spray   Code Status: Full Family Communication: daughters at bedside  Disposition Plan: to be determine    Consultants:  Vascular surgery  Cardiology   Procedures:  TEE: no evidence of endocarditis, positive AS (moderate) and preserved EF 60-65%.    Previous 2-D echo (06/03/14): demonstrated severe LVH and high ventricular filling pressure, which would be consistent with diastolic dysfucntion  Antibiotics:  Aztreonam 10/22>>10/26  Vancomycin 10/22>>10/26  levaquin 10/26  HPI/Subjective: Frail, elderly; afebrile. Denies CPa nd SOB. Continue coughing and is complaining of sore throat (especially when eating)  Objective: Filed Vitals:   07/23/14 0521  BP: 146/72  Pulse: 95  Temp: 98.2 F (36.8 C)  Resp: 22    Intake/Output Summary (Last 24 hours) at 07/23/14 1053 Last data filed at 07/23/14 0700  Gross per 24 hour  Intake    390 ml  Output   1700 ml  Net  -1310 ml   Filed Weights   07/21/14 0525 07/22/14 0446 07/23/14 0509  Weight: 66.18 kg (145 lb 14.4 oz) 65.363 kg (144 lb 1.6 oz) 65 kg (143 lb 4.8 oz)    Exam:   General:  Frail, elderly; still complaining of some coughing spells and anorexia. Denies SOB and CP. No fever   Cardiovascular: positive SEM, no rubs or gallops  Respiratory: positive rhonchi, no wheezing or frank crackles appreciated.    Abdomen: soft, ND, positive BS  Musculoskeletal:  RLE with necrotic toes and bilateral trace of edema   Data Reviewed: Basic Metabolic Panel:  Recent Labs Lab 07/19/14 2039 07/20/14 0215 07/21/14 0528 07/22/14 0607 07/23/14 0451  NA  132* 134* 135* 131* 133*  K 4.3 4.3 3.5* 3.8 4.6  CL 97 99 97 95* 97  CO2 $Re'20 22 26 25 22  'hYZ$ GLUCOSE 192* 148* 108* 112* 121*  BUN $Re'14 12 12 11 12  'HLc$ CREATININE 0.62 0.60 0.62 0.60 0.54  CALCIUM 8.5 8.5 8.2* 8.1* 8.1*  MG  --   --   --  1.7  --    Liver Function Tests:  Recent Labs Lab 07/19/14 2039  AST 13  ALT 15  ALKPHOS 100  BILITOT 0.7  PROT 5.9*  ALBUMIN 2.7*   CBC:  Recent Labs Lab 07/19/14 2222 07/20/14 0215 07/21/14 0528  WBC 12.2* 12.5* 11.5*  NEUTROABS 7.0  --   --   HGB 11.6* 11.9* 11.8*  HCT 33.6* 35.0* 34.7*  MCV 84.4 84.1 85.5  PLT 226 228 220   Cardiac Enzymes:  Recent Labs Lab 07/19/14 2039  CKTOTAL 16   BNP (last 3 results)  Recent Labs  06/02/14 1954 07/19/14 2039  PROBNP 622.2* 1467.0*    Recent Results (from the past 240 hour(s))  URINE CULTURE     Status: None   Collection Time    07/20/14  1:37 AM      Result Value Ref Range Status   Specimen Description URINE, RANDOM   Final   Special Requests NONE   Final   Culture  Setup Time     Final   Value: 07/20/2014 09:07     Performed at [facility]     Final   Value: 3,000 COLONIES/ML     Performed at [facility]   Culture     Final   Value: INSIGNIFICANT GROWTH     Performed at [facility]   Report Status 07/21/2014 FINAL   Final  CULTURE, EXPECTORATED SPUTUM-ASSESSMENT     Status: None   Collection Time    07/20/14  2:09 AM      Result Value Ref Range Status   Specimen Description Expect. Sput   Final   Special Requests NONE   Final   Sputum evaluation     Final   Value: THIS SPECIMEN IS ACCEPTABLE. RESPIRATORY CULTURE REPORT TO FOLLOW.   Report Status 07/20/2014 FINAL   Final  CULTURE, RESPIRATORY (NON-EXPECTORATED)     Status: None   Collection Time    07/20/14  2:09 AM      Result Value Ref Range Status   Specimen Description SPUTUM   Final   Special Requests NONE   Final   Gram Stain     Final   Value: RARE WBC PRESENT,  PREDOMINANTLY MONONUCLEAR     NO SQUAMOUS EPITHELIAL CELLS SEEN     RARE GRAM POSITIVE COCCI     IN PAIRS IN CLUSTERS     Performed at [facility]   Culture     Final   Value: NORMAL OROPHARYNGEAL FLORA     Performed at [facility]   Report Status 07/22/2014 FINAL   Final  CULTURE, BLOOD (ROUTINE X 2)     Status: None   Collection Time    07/20/14  2:10 AM      Result Value Ref Range Status   Specimen Description BLOOD RIGHT ANTECUBITAL   Final   Special  Requests BOTTLES DRAWN AEROBIC ONLY 2ML   Final   Culture  Setup Time     Final   Value: 07/20/2014 08:58     Performed at [facility]   Culture     Final   Value:        BLOOD CULTURE RECEIVED NO GROWTH TO DATE CULTURE WILL BE HELD FOR 5 DAYS BEFORE ISSUING A FINAL NEGATIVE REPORT     Performed at [facility]   Report Status PENDING   Incomplete  CULTURE, BLOOD (ROUTINE X 2)     Status: None   Collection Time    07/20/14  2:15 AM      Result Value Ref Range Status   Specimen Description BLOOD L HAND   Final   Special Requests BOTTLES DRAWN AEROBIC ONLY 5 ML   Final   Culture  Setup Time     Final   Value: 07/20/2014 08:58     Performed at [facility]   Culture     Final   Value:        BLOOD CULTURE RECEIVED NO GROWTH TO DATE CULTURE WILL BE HELD FOR 5 DAYS BEFORE ISSUING A FINAL NEGATIVE REPORT     Performed at [facility]   Report Status PENDING   Incomplete     Studies: Dg Chest 2 View  07/23/2014   CLINICAL DATA:  Persistent difficulty breathing and cough  EXAM: CHEST  2 VIEW  COMPARISON:  July 19, 2014 and June 02, 2014  FINDINGS: There is underlying emphysema. There is diffuse interstitial edema. Heart is mildly enlarged. The pulmonary vascularity reflects pulmonary venous congestion superimposed on emphysematous change. There are compression fractures in the thoracic spine, stable. No adenopathy. There is atherosclerotic change in the aorta.  IMPRESSION:  Findings are felt to be most consistent with congestive heart failure superimposed on emphysematous change. There is no frank airspace consolidation. No new opacity compared to most recent prior study.   Electronically Signed   By: Lowella Grip M.D.   On: 07/23/2014 08:53    Scheduled Meds: . antiseptic oral rinse  7 mL Mouth Rinse BID  . aspirin  81 mg Oral Daily  . budesonide (PULMICORT) nebulizer solution  0.25 mg Nebulization BID  . enoxaparin (LOVENOX) injection  40 mg Subcutaneous Q24H  . folic acid  1 mg Oral Daily  . furosemide  20 mg Oral BID WC  . [START ON 07/25/2014] levofloxacin  750 mg Oral Q48H  . loratadine  10 mg Oral Daily  . metoprolol succinate  12.5 mg Oral Daily  . pantoprazole  40 mg Oral Daily  . polyvinyl alcohol  1 drop Both Eyes q morning - 10a  . potassium chloride  40 mEq Oral Daily  . praziquantel  600 mg Oral QODAY  . predniSONE  5 mg Oral Q breakfast  . saccharomyces boulardii  250 mg Oral BID  . sodium chloride  3 mL Intravenous Q12H  . sodium chloride  3 mL Intravenous Q12H   Continuous Infusions:   Active Problems:   Aortic stenosis   Fever   Acute on chronic diastolic CHF (congestive heart failure)    Time spent: 30 minutes (more than 50% of time dedicated to face to face discussion of her condition, treatment and counseling/coordinating care)    [place], [facility] Pager [phone]. If 7PM-7AM, please contact night-coverage at [email or web address], password [facility] 07/23/2014, 10:53 AM  LOS: 4 days

## 2014-07-23 NOTE — Evaluation (Signed)
Physical Therapy Evaluation Patient Details Name: SASCHA PALMA MRN: [id number] DOB: 09/03/1925 Today's Date: 07/23/2014   History of Present Illness  80 yo female admitted with fever, HF, weakness. hx of polymyalgia rheumatica, breast cancer, R foot with ischemic toes. Pt is from home alone.   Clinical Impression  On eval,pt required Min assist for mobility-able to ambulate ~60 feet with 2 HHA. Pt is deconditoned. Discussed d/c plan-daughter states plan is for home (declines SNF)-states family will provide supervision/assist needed. Recommend HHPT, [place], home health aide. Will continues to assess need for AD (standard RW vs rollator)    Follow Up Recommendations Home health PT;Supervision/Assistance - 24 hour    Equipment Recommendations  Rolling walker with 5" wheels (if pt does not already have. Possibly a rollator (will continue to assess))    Recommendations for Other Services       Precautions / Restrictions Precautions Precautions: Fall Restrictions Weight Bearing Restrictions: No      Mobility  Bed Mobility Overal bed mobility: Needs Assistance Bed Mobility: Supine to Sit;Sit to Supine     Supine to sit: Min guard Sit to supine: Min guard   General bed mobility comments: close guard for safety. Increased time.   Transfers Overall transfer level: Needs assistance Equipment used: 2 person hand held assist Transfers: Sit to/from Stand Sit to Stand: Min assist         General transfer comment: Assist to rise, stabilize, control descent.   Ambulation/Gait Ambulation/Gait assistance: Min assist Ambulation Distance (Feet): 60 Feet Assistive device: 2 person hand held assist Gait Pattern/deviations: Step-through pattern;Decreased stride length;Staggering left;Staggering right;Trunk flexed;Drifts right/left     General Gait Details: assist to stabilize pt. unsteady. fatigues easily.   Stairs            Wheelchair Mobility    Modified Rankin (Stroke  Patients Only)       Balance Overall balance assessment: Needs assistance         Standing balance support: Bilateral upper extremity supported;During functional activity Standing balance-Leahy Scale: Poor                               Pertinent Vitals/Pain Pain Assessment: No/denies pain    Home Living Family/patient expects to be discharged to:: Private residence Living Arrangements: Alone Available Help at Discharge: Family Type of Home: House       Home Layout: One level Home Equipment: Environmental consultant - 2 wheels;Cane - single point      Prior Function Level of Independence: Independent               Hand Dominance        Extremity/Trunk Assessment   Upper Extremity Assessment: Generalized weakness           Lower Extremity Assessment: Generalized weakness      Cervical / Trunk Assessment: Kyphotic  Communication   Communication: No difficulties  Cognition Arousal/Alertness: Awake/alert Behavior During Therapy: WFL for tasks assessed/performed Overall Cognitive Status: Within Functional Limits for tasks assessed                      General Comments      Exercises        Assessment/Plan    PT Assessment Patient needs continued PT services  PT Diagnosis Difficulty walking;Generalized weakness   PT Problem List Decreased strength;Decreased activity tolerance;Decreased balance;Decreased mobility;Decreased knowledge of use of DME  PT Treatment Interventions DME instruction;Gait  training;Functional mobility training;Therapeutic activities;Therapeutic exercise;Patient/family education;Balance training   PT Goals (Current goals can be found in the Care Plan section) Acute Rehab PT Goals Patient Stated Goal: to get stronger. home.  PT Goal Formulation: With patient/family Time For Goal Achievement: 08/06/14 Potential to Achieve Goals: Good    Frequency Min 3X/week   Barriers to discharge        Co-evaluation                End of Session Equipment Utilized During Treatment: Gait belt Activity Tolerance: Patient limited by fatigue Patient left: in bed;with call bell/phone within reach;with family/visitor present           Time: 3893-7342 PT Time Calculation (min): 17 min   Charges:   PT Evaluation $Initial PT Evaluation Tier I: 1 Procedure PT Treatments $Gait Training: 8-22 mins   PT G Codes:          Weston Anna, MPT Pager: [phone]

## 2014-07-23 NOTE — Progress Notes (Signed)
Dressing changes done per [facility] nurse. Will continue to monitor patient. Setzer, Marchelle Folks

## 2014-07-23 NOTE — Consult Note (Signed)
WOC wound consult note Reason for Consult: Two necrotic tips of toes (gangrene per VVS) and Unstageable PrU at sacrum Wound type:Vascular infarct at toes; pressure at sacrum Pressure Ulcer POA: Yes (at sacrum.  No at toes Measurement:See below Wound bed: Sacral Pru (Unstageable): 1.5cm x 1.8cm with depth obscured by the presence of yellow slough.  The two digits on her right foot present with black, necrotic tips (see VVS note) Drainage (amount, consistency, odor) scant serous Periwound:intact Dressing procedure/placement/frequency: We will initiate the painting of the affected digits twice daily for 21 days for the prevention of infection and to facilitate drying of the eschars.  Additionally, we will float her heels to prevent pressure ulcer formation while in house.  As her Unstageable PrU on the sacrum is from sitting in her recliner chair at home, we will provide a pressure redistribution chair cushion that she will be able to take home.  At this time, daily wound care using an enzymatic debriding agent to dissolve the yellow slough obscuring the wound bed is indicated. [facility] nursing team will not follow, but will remain available to this patient, the nursing and medical team.  Please re-consult if needed. Thanks, Maudie Flakes, MSN, RN, [place], [place], [place] [phone])

## 2014-07-23 NOTE — Progress Notes (Signed)
    Subjective:  Denies CP or dyspnea   Objective:  Filed Vitals:   07/22/14 2047 07/23/14 0235 07/23/14 0509 07/23/14 0521  BP:  144/70  146/72  Pulse:  93  95  Temp:  98.1 F (36.7 C)  98.2 F (36.8 C)  TempSrc:  Oral  Oral  Resp:  22  22  Height:      Weight:   143 lb 4.8 oz (65 kg)   SpO2: 98% 97%  96%    Intake/Output from previous day:  Intake/Output Summary (Last 24 hours) at 07/23/14 0740 Last data filed at 07/23/14 0700  Gross per 24 hour  Intake    390 ml  Output   2100 ml  Net  -1710 ml    Physical Exam: Physical exam: Well-developed frail in no acute distress.  Skin is warm and dry.  HEENT is normal.  Neck is supple.  Chest with mild basilar crackles Cardiovascular exam is regular rate and rhythm. 3/6 systolic murmur Abdominal exam nontender or distended. No masses palpated. Extremities show no edema. Necrotic digits RLE neuro grossly intact    Lab Results: Basic Metabolic Panel:  Recent Labs  07/21/14 0528 07/22/14 0607 07/23/14 0451  NA 135* 131* 133*  K 3.5* 3.8 4.6  CL 97 95* 97  CO2 26 25 22   GLUCOSE 108* 112* 121*  BUN 12 11 12   CREATININE 0.62 0.60 0.54  CALCIUM 8.2* 8.1* 8.1*  MG  --  1.7  --    CBC:  Recent Labs  07/21/14 0528  WBC 11.5*  HGB 11.8*  HCT 34.7*  MCV 85.5  PLT 220     Assessment/Plan:  1 Fever-blood cultures are negative; TEE shows no vegetations; Question hospital-acquired pneumonia; would consider ID consult. 2 Acute on chronic diastolic CHF-continue present dose of lasix 3 Moderate aortic stenosis and mild mitral stenosis; history of chordal SAM-will add low-dose Toprol 12.5 mg daily. 4 necrotic digits right lower extremity-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 Management per primary care and vascular surgery. We will sign off. Please call with questions. Kirk Ruths 07/23/2014, 7:40 AM

## 2014-07-24 LAB — BASIC METABOLIC PANEL
Anion gap: 14 (ref 5–15)
BUN: 12 mg/dL (ref 6–23)
CHLORIDE: 96 meq/L (ref 96–112)
CO2: 24 mEq/L (ref 19–32)
Calcium: 8.6 mg/dL (ref 8.4–10.5)
Creatinine, Ser: 0.57 mg/dL (ref 0.50–1.10)
GFR calc Af Amer: >90 mL/min (ref >90)
GFR calc non Af Amer: 81 mL/min — ABNORMAL LOW (ref >90)
GLUCOSE: 127 mg/dL — AB (ref 70–99)
Potassium: 3.4 mEq/L — ABNORMAL LOW (ref 3.7–5.3)
Sodium: 134 mEq/L — ABNORMAL LOW (ref 137–147)

## 2014-07-24 LAB — CBC
HCT: 35.9 % — ABNORMAL LOW (ref 36.0–46.0)
Hemoglobin: 12 g/dL (ref 12.0–15.0)
MCH: 29.1 pg (ref 26.0–34.0)
MCHC: 33.4 g/dL (ref 30.0–36.0)
MCV: 87.1 fL (ref 78.0–100.0)
Platelets: 180 10*3/uL (ref 150–400)
RBC: 4.12 MIL/uL (ref 3.87–5.11)
RDW: 17.2 % — ABNORMAL HIGH (ref 11.5–15.5)
WBC: 9.3 10*3/uL (ref 4.0–10.5)

## 2014-07-24 MED ORDER — BENZONATATE 200 MG PO CAPS: 200.0000 mg | ORAL_CAPSULE | Freq: Three times a day (TID) | ORAL | Status: DC | PRN

## 2014-07-24 MED ORDER — FUROSEMIDE 20 MG PO TABS: 20.0000 mg | ORAL_TABLET | Freq: Two times a day (BID) | ORAL | Status: DC

## 2014-07-24 MED ORDER — POTASSIUM CHLORIDE CRYS ER 20 MEQ PO TBCR
40.0000 meq | EXTENDED_RELEASE_TABLET | Freq: Every day | ORAL | Status: DC
  Administered 2014-07-24: 40 meq via ORAL
  Filled 2014-07-24: qty 2

## 2014-07-24 MED ORDER — METOPROLOL SUCCINATE 12.5 MG HALF TABLET: 12.5000 mg | ORAL_TABLET | Freq: Every day | ORAL | Status: DC

## 2014-07-24 MED ORDER — SACCHAROMYCES BOULARDII 250 MG PO CAPS: 250.0000 mg | ORAL_CAPSULE | Freq: Two times a day (BID) | ORAL | Status: DC

## 2014-07-24 MED ORDER — POTASSIUM CHLORIDE CRYS ER 20 MEQ PO TBCR: 40.0000 meq | EXTENDED_RELEASE_TABLET | Freq: Every day | ORAL | Status: DC

## 2014-07-24 MED ORDER — ASPIRIN 81 MG PO CHEW: 81.0000 mg | CHEWABLE_TABLET | Freq: Every day | ORAL | Status: DC

## 2014-07-24 MED ORDER — LEVOFLOXACIN 750 MG PO TABS: 750.0000 mg | ORAL_TABLET | ORAL | Status: DC

## 2014-07-24 MED ORDER — LORATADINE 10 MG PO TABS: 10.0000 mg | ORAL_TABLET | Freq: Every day | ORAL | Status: DC

## 2014-07-24 NOTE — Progress Notes (Signed)
Physical Therapy Treatment Patient Details Name: Martha Mcbride MRN: [id number] DOB: 10/11/1924 Today's Date: 07/24/2014    History of Present Illness 80 yo female admitted with fever, HF, weakness. hx of polymyalgia rheumatica, breast cancer, R foot with ischemic toes. Pt is from home alone.     PT Comments    Progressing with mobility. Improved performance and activity tolerance this session. Daughter states pt has access to DME. Family will provide 24 hour care.   Follow Up Recommendations  Home health PT;Supervision/Assistance - 24 hour     Equipment Recommendations  Rolling walker with 5" wheels    Recommendations for Other Services       Precautions / Restrictions Precautions Precautions: Fall Restrictions Weight Bearing Restrictions: No    Mobility  Bed Mobility                  Transfers Overall transfer level: Needs assistance Equipment used: Rolling walker (2 wheeled) Transfers: Sit to/from Stand Sit to Stand: Min assist            Ambulation/Gait Ambulation/Gait assistance: Min assist Ambulation Distance (Feet): 115 Feet Assistive device: 4-wheeled walker Gait Pattern/deviations: Step-through pattern;Decreased stride length     General Gait Details: assist to stabilize intermittently. tolerated increased distance this session   Stairs            Wheelchair Mobility    Modified Rankin (Stroke Patients Only)       Balance                                    Cognition Arousal/Alertness: Awake/alert Behavior During Therapy: WFL for tasks assessed/performed Overall Cognitive Status: Within Functional Limits for tasks assessed                      Exercises General Exercises - Lower Extremity Long Arc Quad: AROM;10 reps;Seated    General Comments        Pertinent Vitals/Pain Pain Assessment: No/denies pain    Home Living                      Prior Function            PT Goals  (current goals can now be found in the care plan section) Progress towards PT goals: Progressing toward goals    Frequency  Min 3X/week    PT Plan Current plan remains appropriate    Co-evaluation             End of Session Equipment Utilized During Treatment: Gait belt Activity Tolerance: Patient tolerated treatment well Patient left: with call bell/phone within reach;in chair     Time: 7829-5621 PT Time Calculation (min): 18 min  Charges:  $Gait Training: 8-22 mins                    G Codes:      Weston Anna, MPT Pager: [phone]

## 2014-07-24 NOTE — Discharge Summary (Signed)
Physician Discharge Summary  Martha Mcbride HYW:[id number] DOB: 08-15-1925 DOA: 07/19/2014  PCP: Simona Huh, MD  Admit date: 07/19/2014 Discharge date: 07/24/2014  Time spent: >30 minutes  Recommendations for Outpatient Follow-up:  1. BMET to follow electrolytes and renal function  BNP    Component Value Date/Time   PROBNP 1467.0* 07/19/2014 2039   Filed Weights   07/22/14 0446 07/23/14 0509 07/24/14 0616  Weight: 65.363 kg (144 lb 1.6 oz) 65 kg (143 lb 4.8 oz) 64.501 kg (142 lb 3.2 oz)     Discharge Diagnoses:  Essential HTN Right toes necrosis HCAP SOB  Aortic stenosis Fever Acute on chronic diastolic CHF (congestive heart failure)   Discharge Condition: stable and improved. Discharge home with [facility] services and family care. Follow up with PCP in 10 days. Cardiology and vascular surgery will set up follow up appointments for her as well.  Diet recommendation: low sodium diet   History of present illness:  80 year old female with polymyalgia rheumatica, on chronic prednisone and leflunomide, acid reflux, recent hospitalization for chest pain and fever, status post recent Myoview stress test with low probability for ischemia and no evidence of infarction, 2-D echo September 2015 with EF 65-70%, notable for vigorous systolic function and high ventricular filling pressures, severely calcified mitral valve with moderate stenosis. Pt also had recent diagnosis of Giardia and pinworms and was started on medication one day prior to this admission. Patient was brought in to [facility] long emergency department by family member with main concern of more than one month duration of progressively worsening physical decline, fatigue, poor oral intake, bed bound. Please note that patient is unable to provide any history at the time of admission and most of the details obtained from family members at bedside. Family explained that patient has been having ongoing fever for the past week and  has been fairly difficult to arouse over the past few days. Family also reports noticing 2 toes on the right foot turning black.   Hospital Course:  Acute encephalopathy (toxic, 2/2 HCAP and fever)  -no endocarditis on TEE; blood cx's neg  -aztreonam and vanc discontinue and antibiotic treatment switch to levaquin PO in order to narrow therapy. -patient mentation back to baseline  Sepsis due to HCAP  - Sepsis criteria met on admission: T 100.9, RR > 22 breaths per minute, WBC 12.2, Sepsis source possibly pneumonia and RLE toes necrosis, +/- giardia/pinworms infection  - Antibiotics switch to levaquin PO (dose adjusted for Cr clearance)  -has completed praziquantel as started by primary care physician, will now follow use of flagyl.  -GI pathogen panel negative; stool cx neg -Followup on cardiology recommendations as well as vascular surgery recommendations  -WBC's WNL at discharge   Acute hypoxic respiratory failure, oxygen saturation on 2 L C 92% on admission  - secondary to PNA and acute on chronic diastolic heart failure - ABX for PNA as noted above  -lasix as recommended by cardiology (PO 50m BID)  -daily weights, low sodium diet  -sputum culture (consistent with oropharyngeal microorganism)  Diarrhea  -Repeated GI/stool panel, ova and parasites negative -Completed treatment with praziquantel  -will continue florastor   Gangrenous toes on the right foot  -Unclear etiology at this time, pulses on the right foot palpable with good perfusion  -Will follow up vascular surgery and wound care recommendations and outpatient visit  Polymyalgia rheumatica  - Patient on prednisone and leflunomide  -will continue prednisone but will hold leflunomide for now  -follow up with  rheumatology to restart meds   Acid reflux  - continue PPI  Ulcerative lesions on the tongue  - Possibly from prednisone use and leflunomide  - improved/resolved with use of biotene  Decubitus ulcer  -zinc  oxide and frequent rotation  -wound care consulted; will follow any other recommendations -cushion to distribute pressure better provided.   Rhinitis:  -will continue claritin daily   HTN: will discharge on lasix and toprol  Aortic steonisis -further follow up per cardiology service.  Procedures: TEE: no evidence of endocarditis, positive AS (moderate) and preserved EF 60-65%.  Previous 2-D echo (06/03/14): demonstrated severe LVH and high ventricular filling pressure, which would be consistent with diastolic dysfucntion  Consultations:  Cardiology  Vascular surgery  Discharge Exam: Filed Vitals:   07/24/14 1416  BP: 114/62  Pulse: 91  Temp: 98.2 F (36.8 C)  Resp: 19   General: Frail, elderly; feeling much better. Denies CP and SOB. No fever  Cardiovascular: positive SEM, no rubs or gallops  Respiratory: scattered rhonchi, no wheezing or frank crackles appreciated on exam  Abdomen: soft, ND, positive BS  Musculoskeletal: RLE with necrotic toes and bilateral trace of edema    Discharge Instructions  Discharge Instructions   Diet - low sodium heart healthy    Complete by:  As directed      Discharge instructions    Complete by:  As directed   Take medications as prescribed Maintain yourself well hydrated and increase PO intake Follow a low sodium diet (less than 2 grams daily) Follow up with PCP in 10 days Follow with cardiology service as instructed  Check your weight on daily basis and contact cardiology office if you gain 3 pounds overnight or more than 5 pounds in a week.     Increase activity slowly    Complete by:  As directed             Medication List    STOP taking these medications       praziquantel 600 MG tablet  Commonly known as:  BILTRICIDE      TAKE these medications       antiseptic oral rinse Liqd  15 mLs by Mouth Rinse route as needed for dry mouth.     aspirin 81 MG chewable tablet  Chew 1 tablet (81 mg total) by mouth daily.      benzonatate 200 MG capsule  Commonly known as:  TESSALON  Take 1 capsule (200 mg total) by mouth 3 (three) times daily as needed for cough.     folic acid 1 MG tablet  Commonly known as:  FOLVITE  Take 1 mg by mouth daily.     furosemide 20 MG tablet  Commonly known as:  LASIX  Take 1 tablet (20 mg total) by mouth 2 (two) times daily with breakfast and lunch.     hydroxypropyl methylcellulose / hypromellose 2.5 % ophthalmic solution  Commonly known as:  ISOPTO TEARS / GONIOVISC  Place 1 drop into both eyes every morning.     levofloxacin 750 MG tablet  Commonly known as:  LEVAQUIN  Take 1 tablet (750 mg total) by mouth every other day.  Start taking on:  07/25/2014     loratadine 10 MG tablet  Commonly known as:  CLARITIN  Take 1 tablet (10 mg total) by mouth daily.     metoprolol succinate 12.5 mg Tb24 24 hr tablet  Commonly known as:  TOPROL-XL  Take 0.5 tablets (12.5 mg total) by mouth  daily.     OVER THE COUNTER MEDICATION  Take 1 tablet by mouth daily.     potassium chloride SA 20 MEQ tablet  Commonly known as:  K-DUR,KLOR-CON  Take 2 tablets (40 mEq total) by mouth daily.     predniSONE 5 MG tablet  Commonly known as:  DELTASONE  Take 5 mg by mouth daily with breakfast.     RABEprazole 20 MG tablet  Commonly known as:  ACIPHEX  Take 20 mg by mouth daily.     saccharomyces boulardii 250 MG capsule  Commonly known as:  FLORASTOR  Take 1 capsule (250 mg total) by mouth 2 (two) times daily.       Allergies  Allergen Reactions  . Doxycycline Nausea And Vomiting  . Other Nausea And Vomiting and Other (See Comments)    Anesthesia makes her very sleepy and makes it hard for her to come out of it.   Marland Kitchen Penicillins Hives and Nausea And Vomiting  . Sulfa Antibiotics Nausea And Vomiting       Follow-up Information   Follow up with Simona Huh, MD In 10 days.   Specialty:  Family Medicine   Contact information:   [address], [place] [address] [phone]       Follow up with Kirk Ruths, MD. (office will contact you with appointment details )    Specialty:  Cardiology   Contact information:   [address] [place] [address] [phone]       Follow up with Elam Dutch, MD. Call in 1 week. (office to set up follow up appointment)    Specialty:  Vascular Surgery   Contact information:   [address] [place] [address] [phone]       The results of significant diagnostics from this hospitalization (including imaging, microbiology, ancillary and laboratory) are listed below for reference.    Significant Diagnostic Studies: Dg Chest 2 View  07/23/2014   CLINICAL DATA:  Persistent difficulty breathing and cough  EXAM: CHEST  2 VIEW  COMPARISON:  July 19, 2014 and June 02, 2014  FINDINGS: There is underlying emphysema. There is diffuse interstitial edema. Heart is mildly enlarged. The pulmonary vascularity reflects pulmonary venous congestion superimposed on emphysematous change. There are compression fractures in the thoracic spine, stable. No adenopathy. There is atherosclerotic change in the aorta.  IMPRESSION: Findings are felt to be most consistent with congestive heart failure superimposed on emphysematous change. There is no frank airspace consolidation. No new opacity compared to most recent prior study.   Electronically Signed   By: Lowella Grip M.D.   On: 07/23/2014 08:53   Dg Chest 2 View  07/19/2014   CLINICAL DATA:  Cough and weakness.  History of breast cancer.  EXAM: CHEST  2 VIEW  COMPARISON:  06/02/2014.  FINDINGS: Poor inspiration. Mildly enlarged cardiac silhouette with an interval decrease in size. Interval patchy airspace opacity throughout the majority of both lungs. No definite pleural fluid. Diffuse osteopenia. Stable thoracolumbar spine compression deformity and atheromatous arterial calcifications.  IMPRESSION: Poor inspiration with  interval diffuse bilateral pneumonia or alveolar edema.   Electronically Signed   By: Enrique Sack M.D.   On: 07/19/2014 21:30   Dg Foot Complete Right  07/19/2014   CLINICAL DATA:  Patient reports the second and third post appeared darkened and necrosis, started 1 week ago.  EXAM: RIGHT FOOT COMPLETE - 3+ VIEW  COMPARISON:  None.  FINDINGS: There is no  evidence of fracture or dislocation. There is no evidence of osteomyelitis. There is soft tissue thinning in the distal second digit.  IMPRESSION: No evidence of osteomyelitis. Soft tissue thinning in the distal second digit.   Electronically Signed   By: Abelardo Diesel M.D.   On: 07/19/2014 21:26    Microbiology: Recent Results (from the past 240 hour(s))  URINE CULTURE     Status: None   Collection Time    07/20/14  1:37 AM      Result Value Ref Range Status   Specimen Description URINE, RANDOM   Final   Special Requests NONE   Final   Culture  Setup Time     Final   Value: 07/20/2014 09:07     Performed at [facility]     Final   Value: 3,000 COLONIES/ML     Performed at [facility]   Culture     Final   Value: INSIGNIFICANT GROWTH     Performed at [facility]   Report Status 07/21/2014 FINAL   Final  CULTURE, EXPECTORATED SPUTUM-ASSESSMENT     Status: None   Collection Time    07/20/14  2:09 AM      Result Value Ref Range Status   Specimen Description Expect. Sput   Final   Special Requests NONE   Final   Sputum evaluation     Final   Value: THIS SPECIMEN IS ACCEPTABLE. RESPIRATORY CULTURE REPORT TO FOLLOW.   Report Status 07/20/2014 FINAL   Final  CULTURE, RESPIRATORY (NON-EXPECTORATED)     Status: None   Collection Time    07/20/14  2:09 AM      Result Value Ref Range Status   Specimen Description SPUTUM   Final   Special Requests NONE   Final   Gram Stain     Final   Value: RARE WBC PRESENT, PREDOMINANTLY MONONUCLEAR     NO SQUAMOUS EPITHELIAL CELLS SEEN     RARE GRAM POSITIVE  COCCI     IN PAIRS IN CLUSTERS     Performed at [facility]   Culture     Final   Value: NORMAL OROPHARYNGEAL FLORA     Performed at [facility]   Report Status 07/22/2014 FINAL   Final  CULTURE, BLOOD (ROUTINE X 2)     Status: None   Collection Time    07/20/14  2:10 AM      Result Value Ref Range Status   Specimen Description BLOOD RIGHT ANTECUBITAL   Final   Special Requests BOTTLES DRAWN AEROBIC ONLY 2ML   Final   Culture  Setup Time     Final   Value: 07/20/2014 08:58     Performed at [facility]   Culture     Final   Value:        BLOOD CULTURE RECEIVED NO GROWTH TO DATE CULTURE WILL BE HELD FOR 5 DAYS BEFORE ISSUING A FINAL NEGATIVE REPORT     Performed at [facility]   Report Status PENDING   Incomplete  CULTURE, BLOOD (ROUTINE X 2)     Status: None   Collection Time    07/20/14  2:15 AM      Result Value Ref Range Status   Specimen Description BLOOD L HAND   Final   Special Requests BOTTLES DRAWN AEROBIC ONLY 5 ML   Final   Culture  Setup Time     Final  Value: 07/20/2014 08:58     Performed at [facility]   Culture     Final   Value:        BLOOD CULTURE RECEIVED NO GROWTH TO DATE CULTURE WILL BE HELD FOR 5 DAYS BEFORE ISSUING A FINAL NEGATIVE REPORT     Performed at [facility]   Report Status PENDING   Incomplete  OVA AND PARASITE EXAMINATION     Status: None   Collection Time    07/20/14  5:08 AM      Result Value Ref Range Status   Specimen Description STOOL   Final   Special Requests NONE   Final   Ova and parasites     Final   Value: NO OVA OR PARASITES SEEN     Performed at [facility]   Report Status 07/23/2014 FINAL   Final     Labs: Basic Metabolic Panel:  Recent Labs Lab 07/20/14 0215 07/21/14 0528 07/22/14 0607 07/23/14 0451 07/24/14 0346  NA 134* 135* 131* 133* 134*  K 4.3 3.5* 3.8 4.6 3.4*  CL 99 97 95* 97 96  CO2 _0 GLUCOSE 148* 108* 112* 121* 127*  BUN _1 CREATININE 0.60 0.62 0.60 0.54 0.57  CALCIUM 8.5 8.2* 8.1* 8.1* 8.6  MG  --   --  1.7  --   --    Liver Function Tests:  Recent Labs Lab 07/19/14 2039  AST 13  ALT 15  ALKPHOS 100  BILITOT 0.7  PROT 5.9*  ALBUMIN 2.7*   CBC:  Recent Labs Lab 07/19/14 2222 07/20/14 0215 07/21/14 0528 07/24/14 0346  WBC 12.2* 12.5* 11.5* 9.3  NEUTROABS 7.0  --   --   --   HGB 11.6* 11.9* 11.8* 12.0  HCT 33.6* 35.0* 34.7* 35.9*  MCV 84.4 84.1 85.5 87.1  PLT 226 228 220 180   Cardiac Enzymes:  Recent Labs Lab 07/19/14 2039  CKTOTAL 16   BNP: BNP (last 3 results)  Recent Labs  06/02/14 1954 07/19/14 2039  PROBNP 622.2* 1467.0*    Signed:  Barton Dubois  [facility] 07/24/2014, 3:06 PM

## 2014-07-25 ENCOUNTER — Telehealth: Payer: Self-pay | Admitting: *Deleted

## 2014-07-25 NOTE — Telephone Encounter (Signed)
Patient was discharged from hospital yesterday after pneumonia, CHF, pinworms/ringworms (?). She was nauseous when [place] arrived with some labored breathing and O2 90-92%. Patient weight 144 lbs but has not taken lasix last night or this AM r/t nausea. By the time I talked with Kenney Houseman, patient was up walking around and feeling better than upon [facility] nurse's arrival. Per discharge summary, patient needs hospital f/up with Dr. Stanford Breed (message has been routed to his scheduler to set this up). Kenney Houseman will set up another [facility] visit, should patient not be seen by provider today. It was encouraged that patient attempt PO intake in order to take all prescribed medications and was encourage to use incentive spirometer and not stay sedentary (r/t pna).

## 2014-07-25 NOTE — Telephone Encounter (Signed)
Take lasix as prescribed if possible Kirk Ruths

## 2014-07-26 LAB — CULTURE, BLOOD (ROUTINE X 2)
CULTURE: NO GROWTH
Culture: NO GROWTH

## 2014-07-28 ENCOUNTER — Emergency Department (HOSPITAL_COMMUNITY): Payer: Medicare Other

## 2014-07-28 ENCOUNTER — Inpatient Hospital Stay (HOSPITAL_COMMUNITY)
Admission: EM | Admit: 2014-07-28 | Discharge: 2014-08-10 | DRG: 871 | Disposition: A | Discharge status: Home Health | Payer: Medicare Other | Attending: Internal Medicine | Admitting: Internal Medicine

## 2014-07-28 ENCOUNTER — Encounter (HOSPITAL_COMMUNITY): Payer: Self-pay | Admitting: Emergency Medicine

## 2014-07-28 DIAGNOSIS — J9601 Acute respiratory failure with hypoxia: Secondary | ICD-10-CM | POA: Diagnosis present

## 2014-07-28 DIAGNOSIS — Z853 Personal history of malignant neoplasm of breast: Secondary | ICD-10-CM | POA: Diagnosis not present

## 2014-07-28 DIAGNOSIS — Z79899 Other long term (current) drug therapy: Secondary | ICD-10-CM

## 2014-07-28 DIAGNOSIS — Z7952 Long term (current) use of systemic steroids: Secondary | ICD-10-CM | POA: Diagnosis not present

## 2014-07-28 DIAGNOSIS — Z823 Family history of stroke: Secondary | ICD-10-CM

## 2014-07-28 DIAGNOSIS — Z8701 Personal history of pneumonia (recurrent): Secondary | ICD-10-CM

## 2014-07-28 DIAGNOSIS — R0600 Dyspnea, unspecified: Secondary | ICD-10-CM | POA: Insufficient documentation

## 2014-07-28 DIAGNOSIS — R627 Adult failure to thrive: Secondary | ICD-10-CM | POA: Diagnosis present

## 2014-07-28 DIAGNOSIS — J479 Bronchiectasis, uncomplicated: Secondary | ICD-10-CM | POA: Diagnosis present

## 2014-07-28 DIAGNOSIS — J84112 Idiopathic pulmonary fibrosis: Secondary | ICD-10-CM | POA: Diagnosis present

## 2014-07-28 DIAGNOSIS — R1310 Dysphagia, unspecified: Secondary | ICD-10-CM

## 2014-07-28 DIAGNOSIS — Z888 Allergy status to other drugs, medicaments and biological substances status: Secondary | ICD-10-CM

## 2014-07-28 DIAGNOSIS — K219 Gastro-esophageal reflux disease without esophagitis: Secondary | ICD-10-CM | POA: Diagnosis present

## 2014-07-28 DIAGNOSIS — Z0189 Encounter for other specified special examinations: Secondary | ICD-10-CM

## 2014-07-28 DIAGNOSIS — Z6824 Body mass index (BMI) 24.0-24.9, adult: Secondary | ICD-10-CM

## 2014-07-28 DIAGNOSIS — J690 Pneumonitis due to inhalation of food and vomit: Secondary | ICD-10-CM | POA: Diagnosis present

## 2014-07-28 DIAGNOSIS — D849 Immunodeficiency, unspecified: Secondary | ICD-10-CM | POA: Diagnosis present

## 2014-07-28 DIAGNOSIS — E871 Hypo-osmolality and hyponatremia: Secondary | ICD-10-CM | POA: Diagnosis present

## 2014-07-28 DIAGNOSIS — R509 Fever, unspecified: Secondary | ICD-10-CM | POA: Diagnosis present

## 2014-07-28 DIAGNOSIS — E440 Moderate protein-calorie malnutrition: Secondary | ICD-10-CM | POA: Diagnosis present

## 2014-07-28 DIAGNOSIS — R1312 Dysphagia, oropharyngeal phase: Secondary | ICD-10-CM | POA: Diagnosis present

## 2014-07-28 DIAGNOSIS — Z880 Allergy status to penicillin: Secondary | ICD-10-CM | POA: Diagnosis not present

## 2014-07-28 DIAGNOSIS — L8990 Pressure ulcer of unspecified site, unspecified stage: Secondary | ICD-10-CM | POA: Diagnosis present

## 2014-07-28 DIAGNOSIS — I96 Gangrene, not elsewhere classified: Secondary | ICD-10-CM | POA: Diagnosis present

## 2014-07-28 DIAGNOSIS — T17908A Unspecified foreign body in respiratory tract, part unspecified causing other injury, initial encounter: Secondary | ICD-10-CM | POA: Insufficient documentation

## 2014-07-28 DIAGNOSIS — M353 Polymyalgia rheumatica: Secondary | ICD-10-CM | POA: Diagnosis present

## 2014-07-28 DIAGNOSIS — IMO0001 Reserved for inherently not codable concepts without codable children: Secondary | ICD-10-CM | POA: Insufficient documentation

## 2014-07-28 DIAGNOSIS — M1990 Unspecified osteoarthritis, unspecified site: Secondary | ICD-10-CM | POA: Diagnosis present

## 2014-07-28 DIAGNOSIS — Z881 Allergy status to other antibiotic agents status: Secondary | ICD-10-CM

## 2014-07-28 DIAGNOSIS — I5032 Chronic diastolic (congestive) heart failure: Secondary | ICD-10-CM | POA: Insufficient documentation

## 2014-07-28 DIAGNOSIS — R0902 Hypoxemia: Secondary | ICD-10-CM | POA: Diagnosis present

## 2014-07-28 DIAGNOSIS — I050 Rheumatic mitral stenosis: Secondary | ICD-10-CM | POA: Diagnosis present

## 2014-07-28 DIAGNOSIS — A419 Sepsis, unspecified organism: Secondary | ICD-10-CM | POA: Diagnosis present

## 2014-07-28 DIAGNOSIS — D899 Disorder involving the immune mechanism, unspecified: Secondary | ICD-10-CM | POA: Diagnosis present

## 2014-07-28 DIAGNOSIS — R0602 Shortness of breath: Secondary | ICD-10-CM | POA: Diagnosis present

## 2014-07-28 DIAGNOSIS — R918 Other nonspecific abnormal finding of lung field: Secondary | ICD-10-CM | POA: Insufficient documentation

## 2014-07-28 LAB — CBC WITH DIFFERENTIAL/PLATELET
Basophils Absolute: 0.1 10*3/uL (ref 0.0–0.1)
Basophils Relative: 1 % (ref 0–1)
EOS ABS: 0.6 10*3/uL (ref 0.0–0.7)
Eosinophils Relative: 5 % (ref 0–5)
HCT: 37.7 % (ref 36.0–46.0)
Hemoglobin: 12.7 g/dL (ref 12.0–15.0)
LYMPHS ABS: 2 10*3/uL (ref 0.7–4.0)
Lymphocytes Relative: 16 % (ref 12–46)
MCH: 29.5 pg (ref 26.0–34.0)
MCHC: 33.7 g/dL (ref 30.0–36.0)
MCV: 87.5 fL (ref 78.0–100.0)
MONO ABS: 1.4 10*3/uL — AB (ref 0.1–1.0)
MONOS PCT: 11 % (ref 3–12)
Neutro Abs: 8.4 10*3/uL — ABNORMAL HIGH (ref 1.7–7.7)
Neutrophils Relative %: 67 % (ref 43–77)
PLATELETS: 232 10*3/uL (ref 150–400)
RBC: 4.31 MIL/uL (ref 3.87–5.11)
RDW: 18 % — ABNORMAL HIGH (ref 11.5–15.5)
WBC: 12.5 10*3/uL — AB (ref 4.0–10.5)

## 2014-07-28 LAB — URINALYSIS, ROUTINE W REFLEX MICROSCOPIC
BILIRUBIN URINE: NEGATIVE
Glucose, UA: NEGATIVE mg/dL
HGB URINE DIPSTICK: NEGATIVE
Ketones, ur: NEGATIVE mg/dL
NITRITE: NEGATIVE
PROTEIN: NEGATIVE mg/dL
SPECIFIC GRAVITY, URINE: 1.013 (ref 1.005–1.030)
Urobilinogen, UA: 1 mg/dL (ref 0.0–1.0)
pH: 6 (ref 5.0–8.0)

## 2014-07-28 LAB — COMPREHENSIVE METABOLIC PANEL
ALBUMIN: 2.8 g/dL — AB (ref 3.5–5.2)
ALT: 24 U/L (ref 0–35)
ANION GAP: 19 — AB (ref 5–15)
AST: 18 U/L (ref 0–37)
Alkaline Phosphatase: 119 U/L — ABNORMAL HIGH (ref 39–117)
BUN: 17 mg/dL (ref 6–23)
CALCIUM: 8.5 mg/dL (ref 8.4–10.5)
CO2: 21 mEq/L (ref 19–32)
CREATININE: 0.7 mg/dL (ref 0.50–1.10)
Chloride: 90 mEq/L — ABNORMAL LOW (ref 96–112)
GFR calc Af Amer: 88 mL/min — ABNORMAL LOW (ref >90)
GFR calc non Af Amer: 76 mL/min — ABNORMAL LOW (ref >90)
Glucose, Bld: 206 mg/dL — ABNORMAL HIGH (ref 70–99)
Potassium: 4 mEq/L (ref 3.7–5.3)
Sodium: 130 mEq/L — ABNORMAL LOW (ref 137–147)
Total Bilirubin: 0.8 mg/dL (ref 0.3–1.2)
Total Protein: 6.5 g/dL (ref 6.0–8.3)

## 2014-07-28 LAB — TROPONIN I

## 2014-07-28 LAB — EXPECTORATED SPUTUM ASSESSMENT W REFEX TO RESP CULTURE

## 2014-07-28 LAB — URINE MICROSCOPIC-ADD ON

## 2014-07-28 LAB — HEMOGLOBIN A1C
Hgb A1c MFr Bld: 6.4 % — ABNORMAL HIGH (ref <5.7)
Mean Plasma Glucose: 137 mg/dL — ABNORMAL HIGH (ref <117)

## 2014-07-28 LAB — LACTIC ACID, PLASMA: LACTIC ACID, VENOUS: 3.5 mmol/L — AB (ref 0.5–2.2)

## 2014-07-28 LAB — MRSA PCR SCREENING: MRSA by PCR: NEGATIVE

## 2014-07-28 LAB — PRO B NATRIURETIC PEPTIDE: PRO B NATRI PEPTIDE: 576.5 pg/mL — AB (ref 0–450)

## 2014-07-28 MED ORDER — SODIUM CHLORIDE 0.9 % IJ SOLN
3.0000 mL | Freq: Two times a day (BID) | INTRAMUSCULAR | Status: DC
  Administered 2014-07-28 – 2014-08-09 (×15): 3 mL via INTRAVENOUS

## 2014-07-28 MED ORDER — PANTOPRAZOLE SODIUM 40 MG PO TBEC
40.0000 mg | DELAYED_RELEASE_TABLET | Freq: Every day | ORAL | Status: DC
  Administered 2014-07-29 – 2014-07-30 (×2): 40 mg via ORAL
  Filled 2014-07-28 (×3): qty 1

## 2014-07-28 MED ORDER — FUROSEMIDE 20 MG PO TABS: 20.0000 mg | ORAL_TABLET | Freq: Every day | ORAL | Status: DC

## 2014-07-28 MED ORDER — ONDANSETRON HCL 4 MG PO TABS
4.0000 mg | ORAL_TABLET | Freq: Four times a day (QID) | ORAL | Status: DC | PRN
  Administered 2014-08-10: 4 mg via ORAL
  Filled 2014-07-28 (×2): qty 1

## 2014-07-28 MED ORDER — POLYVINYL ALCOHOL 1.4 % OP SOLN
1.0000 [drp] | Freq: Every day | OPHTHALMIC | Status: DC
  Administered 2014-07-29 – 2014-08-10 (×12): 1 [drp] via OPHTHALMIC
  Filled 2014-07-28: qty 15

## 2014-07-28 MED ORDER — ACETAMINOPHEN 650 MG RE SUPP
650.0000 mg | Freq: Four times a day (QID) | RECTAL | Status: DC | PRN
  Filled 2014-07-28: qty 1

## 2014-07-28 MED ORDER — FLUCONAZOLE 100 MG PO TABS
100.0000 mg | ORAL_TABLET | Freq: Every day | ORAL | Status: DC
  Administered 2014-07-29 – 2014-07-30 (×2): 100 mg via ORAL
  Filled 2014-07-28 (×3): qty 1

## 2014-07-28 MED ORDER — PREDNISONE 5 MG PO TABS
5.0000 mg | ORAL_TABLET | Freq: Every day | ORAL | Status: DC
  Administered 2014-07-29 – 2014-07-31 (×3): 5 mg via ORAL
  Filled 2014-07-28 (×5): qty 1

## 2014-07-28 MED ORDER — ENOXAPARIN SODIUM 40 MG/0.4ML ~~LOC~~ SOLN
40.0000 mg | SUBCUTANEOUS | Status: DC
  Administered 2014-07-28 – 2014-08-04 (×8): 40 mg via SUBCUTANEOUS
  Filled 2014-07-28 (×9): qty 0.4

## 2014-07-28 MED ORDER — VANCOMYCIN HCL 500 MG IV SOLR
500.0000 mg | Freq: Two times a day (BID) | INTRAVENOUS | Status: DC
  Administered 2014-07-28 – 2014-07-30 (×5): 500 mg via INTRAVENOUS
  Filled 2014-07-28 (×5): qty 500

## 2014-07-28 MED ORDER — FOLIC ACID 1 MG PO TABS
1.0000 mg | ORAL_TABLET | Freq: Every day | ORAL | Status: DC
  Administered 2014-07-29 – 2014-07-30 (×2): 1 mg via ORAL
  Filled 2014-07-28 (×3): qty 1

## 2014-07-28 MED ORDER — DEXTROSE 5 % IV SOLN
1.0000 g | Freq: Once | INTRAVENOUS | Status: AC
  Administered 2014-07-28: 1 g via INTRAVENOUS
  Filled 2014-07-28: qty 1

## 2014-07-28 MED ORDER — BIOTENE DRY MOUTH MT LIQD: 15.0000 mL | OROMUCOSAL | Status: DC | PRN

## 2014-07-28 MED ORDER — SODIUM CHLORIDE 0.9 % IV SOLN
250.0000 mL | INTRAVENOUS | Status: DC | PRN
Start: 2014-07-28 — End: 2014-08-10

## 2014-07-28 MED ORDER — HYPROMELLOSE (GONIOSCOPIC) 2.5 % OP SOLN
1.0000 [drp] | Freq: Every morning | OPHTHALMIC | Status: DC
  Filled 2014-07-28: qty 15

## 2014-07-28 MED ORDER — ASPIRIN 81 MG PO CHEW
81.0000 mg | CHEWABLE_TABLET | Freq: Every day | ORAL | Status: DC
  Administered 2014-07-29 – 2014-07-30 (×2): 81 mg via ORAL
  Filled 2014-07-28 (×3): qty 1

## 2014-07-28 MED ORDER — ENSURE COMPLETE PO LIQD
237.0000 mL | Freq: Two times a day (BID) | ORAL | Status: DC
  Filled 2014-07-28: qty 237

## 2014-07-28 MED ORDER — LORATADINE 10 MG PO TABS
10.0000 mg | ORAL_TABLET | Freq: Every day | ORAL | Status: DC
  Administered 2014-07-29 – 2014-07-30 (×2): 10 mg via ORAL
  Filled 2014-07-28 (×4): qty 1

## 2014-07-28 MED ORDER — METOPROLOL SUCCINATE ER 25 MG PO TB24
12.5000 mg | ORAL_TABLET | Freq: Every day | ORAL | Status: DC
  Administered 2014-07-29 – 2014-07-31 (×3): 12.5 mg via ORAL
  Filled 2014-07-28 (×3): qty 1

## 2014-07-28 MED ORDER — HYDROCORTISONE NA SUCCINATE PF 100 MG IJ SOLR
50.0000 mg | Freq: Once | INTRAMUSCULAR | Status: AC
Start: 2014-07-28 — End: 2014-07-28
  Administered 2014-07-28: 50 mg via INTRAVENOUS
  Filled 2014-07-28: qty 2

## 2014-07-28 MED ORDER — POTASSIUM CHLORIDE CRYS ER 20 MEQ PO TBCR
40.0000 meq | EXTENDED_RELEASE_TABLET | Freq: Every day | ORAL | Status: DC
  Administered 2014-07-29 – 2014-07-30 (×2): 40 meq via ORAL
  Filled 2014-07-28 (×3): qty 2

## 2014-07-28 MED ORDER — CETYLPYRIDINIUM CHLORIDE 0.05 % MT LIQD
7.0000 mL | Freq: Two times a day (BID) | OROMUCOSAL | Status: DC
  Administered 2014-07-28 – 2014-08-10 (×21): 7 mL via OROMUCOSAL

## 2014-07-28 MED ORDER — BENZONATATE 100 MG PO CAPS: 200.0000 mg | ORAL_CAPSULE | Freq: Three times a day (TID) | ORAL | Status: DC | PRN

## 2014-07-28 MED ORDER — SODIUM CHLORIDE 0.9 % IJ SOLN
3.0000 mL | INTRAMUSCULAR | Status: DC | PRN
  Administered 2014-08-07: 3 mL via INTRAVENOUS
  Filled 2014-07-28: qty 3

## 2014-07-28 MED ORDER — SACCHAROMYCES BOULARDII 250 MG PO CAPS
250.0000 mg | ORAL_CAPSULE | Freq: Two times a day (BID) | ORAL | Status: DC
  Administered 2014-07-28 – 2014-07-30 (×5): 250 mg via ORAL
  Filled 2014-07-28 (×8): qty 1

## 2014-07-28 MED ORDER — ONDANSETRON HCL 4 MG/2ML IJ SOLN
4.0000 mg | Freq: Four times a day (QID) | INTRAMUSCULAR | Status: DC | PRN
  Administered 2014-07-31 – 2014-08-08 (×2): 4 mg via INTRAVENOUS
  Filled 2014-07-28 (×3): qty 2

## 2014-07-28 MED ORDER — ACETAMINOPHEN 325 MG PO TABS
650.0000 mg | ORAL_TABLET | Freq: Four times a day (QID) | ORAL | Status: DC | PRN
  Administered 2014-08-08: 650 mg via ORAL
  Filled 2014-07-28: qty 2

## 2014-07-28 MED ORDER — CHLORHEXIDINE GLUCONATE 0.12 % MT SOLN
15.0000 mL | Freq: Two times a day (BID) | OROMUCOSAL | Status: DC
  Administered 2014-07-28 – 2014-08-10 (×24): 15 mL via OROMUCOSAL
  Filled 2014-07-28 (×28): qty 15

## 2014-07-28 NOTE — ED Notes (Addendum)
Patient's daughter reports home health RN checked SpO2 today of 80% on RA. Was admitted here two weeks ago for bilateral pneumonia and was on O2 therapy for entire hospitalization but was not sent home with O2 orders. Has become increasingly lethargic over the past few days. Daughter says patient becomes increasingly hypoxic when she lies down. RR even but labored. Saturation 90% on RA in triage. No other complaints/concerns.

## 2014-07-28 NOTE — ED Notes (Signed)
MD at bedside. 

## 2014-07-28 NOTE — ED Provider Notes (Signed)
CSN: [id number]     Arrival date & time 07/28/14  1137 History   First MD Initiated Contact with Patient 07/28/14 1144     Chief Complaint  Patient presents with  . Low O2 saturation   . Hypoxia      HPI Patient was discharged in the hospital several days ago after being treated for healthcare associated pneumonia.she has been compliant with her discharge medications thus far.  She was also thought to have congestive heart failure.  She continues to take her Lasix.  She was brought to the emergency department today for worsening shortness of breath today and hypoxia as noted by a home pulse oximeter that the daughter has.  Patient feels better on oxygen.  Found to have a fever of 103 in the emergency department.No urinary complaints.  Family reports mild confusion today.  Patient denies neck stiffness or rash.  Nausea without vomiting.  No diarrhea.  No abdominal complaints.  No urinary complaints.      Past Medical History  Diagnosis Date  . Arthritis   . Polymyalgia   . Acid reflux   . Immune deficiency disorder     autoimmune disorder - polymyalgia - on prednisone  . Breast cancer     Treated with lumpectomy and radiation  . Giardia   . Pinworms    Past Surgical History  Procedure Laterality Date  . Breast lumpectomy    . Partial hysterectomy    . Tonsillectomy    . Tee without cardioversion N/A 07/20/2014    Procedure: TRANSESOPHAGEAL ECHOCARDIOGRAM (TEE);  Surgeon: Candee Furbish, MD;  Location: [facility] ENDOSCOPY;  Service: Cardiovascular;  Laterality: N/A;   Family History  Problem Relation Age of Onset  . Stroke Mother    History  Substance Use Topics  . Smoking status: Never Smoker   . Smokeless tobacco: Not on file  . Alcohol Use: No   OB History   Grav Para Term Preterm Abortions TAB SAB Ect Mult Living                 Review of Systems  All other systems reviewed and are negative.     Allergies  Doxycycline; Other; Penicillins; and Sulfa  antibiotics  Home Medications   Prior to Admission medications   Medication Sig Start Date End Date Taking? Authorizing Provider  antiseptic oral rinse (BIOTENE) LIQD 15 mLs by Mouth Rinse route as needed for dry mouth.   Yes Historical Provider, MD  aspirin 81 MG chewable tablet Chew 1 tablet (81 mg total) by mouth daily. 07/24/14  Yes Barton Dubois, MD  benzonatate (TESSALON) 200 MG capsule Take 1 capsule (200 mg total) by mouth 3 (three) times daily as needed for cough. 07/24/14  Yes Barton Dubois, MD  fluconazole (DIFLUCAN) 100 MG tablet Take 100 mg by mouth daily.  07/09/14  Yes Historical Provider, MD  folic acid (FOLVITE) 1 MG tablet Take 1 mg by mouth daily.   Yes Historical Provider, MD  furosemide (LASIX) 20 MG tablet Take 1 tablet (20 mg total) by mouth 2 (two) times daily with breakfast and lunch. 07/24/14  Yes Barton Dubois, MD  hydroxypropyl methylcellulose / hypromellose (ISOPTO TEARS / GONIOVISC) 2.5 % ophthalmic solution Place 1 drop into both eyes every morning.   Yes Historical Provider, MD  levofloxacin (LEVAQUIN) 750 MG tablet Take 1 tablet (750 mg total) by mouth every other day. 07/25/14  Yes Barton Dubois, MD  loratadine (CLARITIN) 10 MG tablet Take 1 tablet (10 mg total)  by mouth daily. 07/24/14  Yes Barton Dubois, MD  metoprolol succinate (TOPROL-XL) 12.5 mg TB24 24 hr tablet Take 0.5 tablets (12.5 mg total) by mouth daily. 07/24/14  Yes Barton Dubois, MD  potassium chloride SA (K-DUR,KLOR-CON) 20 MEQ tablet Take 2 tablets (40 mEq total) by mouth daily. 07/24/14  Yes Barton Dubois, MD  predniSONE (DELTASONE) 5 MG tablet Take 5 mg by mouth daily with breakfast.   Yes Historical Provider, MD  RABEprazole (ACIPHEX) 20 MG tablet Take 20 mg by mouth daily.   Yes Historical Provider, MD  saccharomyces boulardii (FLORASTOR) 250 MG capsule Take 1 capsule (250 mg total) by mouth 2 (two) times daily. 07/24/14  Yes Barton Dubois, MD   BP 127/69  Pulse 107  Temp(Src) 103 F (39.4  C) (Rectal)  Resp 26  SpO2 95% Physical Exam  Nursing note and vitals reviewed. Constitutional: She is oriented to person, place, and time. She appears well-developed and well-nourished. No distress.  HENT:  Head: Normocephalic and atraumatic.  Eyes: EOM are normal.  Neck: Normal range of motion.  Cardiovascular: Normal rate, regular rhythm and normal heart sounds.   Pulmonary/Chest: Effort normal and breath sounds normal.  Abdominal: Soft. She exhibits no distension. There is no tenderness.  Musculoskeletal: Normal range of motion.  Neurological: She is alert and oriented to person, place, and time.  Skin: Skin is warm and dry.  Psychiatric: She has a normal mood and affect. Judgment normal.    ED Course  Procedures (including critical care time) Labs Review Labs Reviewed  COMPREHENSIVE METABOLIC PANEL - Abnormal; Notable for the following:    Sodium 130 (*)    Chloride 90 (*)    Glucose, Bld 206 (*)    Albumin 2.8 (*)    Alkaline Phosphatase 119 (*)    GFR calc non Af Amer 76 (*)    GFR calc Af Amer 88 (*)    Anion gap 19 (*)    All other components within normal limits  LACTIC ACID, PLASMA - Abnormal; Notable for the following:    Lactic Acid, Venous 3.5 (*)    All other components within normal limits  PRO B NATRIURETIC PEPTIDE - Abnormal; Notable for the following:    Pro B Natriuretic peptide (BNP) 576.5 (*)    All other components within normal limits  CULTURE, BLOOD (ROUTINE X 2)  CULTURE, BLOOD (ROUTINE X 2)  TROPONIN I  CBC WITH DIFFERENTIAL  URINALYSIS, ROUTINE W REFLEX MICROSCOPIC    Imaging Review Dg Chest 2 View  07/28/2014   CLINICAL DATA:  Worsening hypoxia and lethargy. Recent pneumonia. Breast carcinoma.  EXAM: CHEST  2 VIEW  COMPARISON:  07/23/2014  FINDINGS: Diffuse pulmonary interstitial infiltrates show no significant change. No evidence of superimposed consolidation or pleural effusion. Cardiomegaly remains stable.  IMPRESSION: No significant  change in cardiomegaly and diffuse bilateral pulmonary interstitial infiltrates.   Electronically Signed   By: Earle Gell M.D.   On: 07/28/2014 13:04  I personally reviewed the imaging tests through PACS system I reviewed available ER/hospitalization records through the EMR    EKG Interpretation   Date/Time:  Saturday July 28 2014 12:39:40 EDT Ventricular Rate:  97 PR Interval:  152 QRS Duration: 84 QT Interval:  346 QTC Calculation: 439 R Axis:   -47 Text Interpretation:  Sinus tachycardia Supraventricular bigeminy Left  ventricular hypertrophy Inferior infarct, old Anterior infarct, old No  significant change was found Confirmed by Jariana Shumard  MD, Jodette Wik ([id number]) on  07/28/2014 1:05:15 PM  MDM   Final diagnoses:  Dyspnea  Hypoxia  Fever, unspecified fever cause  Sepsis, due to unspecified organism    Sepsis no clear etiology.  Given her hypoxia and shortness of breath I suspect this is worsening pneumonia despite her x-ray which does not show significant changes from her prior bilateral lower lobe infiltrates.  She feels better on oxygen.  Her lactate is 3.5.  Vancomycin and cefepime given for what appears to be healthcare associated pneumonia clinically.  Urinalysis pending.  Patient will be admitted to the stepdown unit.    Hoy Morn, MD 07/28/14 1351

## 2014-07-28 NOTE — H&P (Addendum)
[facility] History and Physical  BRUNELLA WILEMAN KCL:[id number] DOB: Apr 16, 1925 DOA: 07/28/2014  Referring physician: Dr Venora Maples.  PCP: Simona Huh, MD   Chief Complaint: Fever.   HPI: Martha Mcbride is a 80 y.o. female with PMH significant for Polymyalgia on prednisone,  severely calcified mitral valve with moderate stenosis, chronic diastolic heart failure,Gangrenous toes on the right foot , last admission 10-27 was treated for health care associated PNA, encephalopathy, and diastolic HF exacerbation.   She was brought to the emergency department today for worsening shortness of breath today and hypoxia as noted by a home pulse oximeter that the daughter has. Per daughter patient has been having problem of low oxygen level at night during sleep. She was trying to arrange home oxygen trough patient PCP but was unsuccessful. Today patient oxygen level was low.   Evaluation in the ED: Patient was found to be febrile, with tempeture at 103. Per family patient has been more confuse today.  Patient denies diarrhea, no chest pain, or dyspnea. Cough is better, but still with productive cough. WBC at 12, Lactic acid at 3.5.  Chest x ray with No significant change in cardiomegaly and diffuse bilateral pulmonary interstitial infiltrates. Patient relates dysuria.  Patient is still taking Levaquin.   Review of Systems:  Negative, except as per HPI.    Past Medical History  Diagnosis Date  . Arthritis   . Polymyalgia   . Acid reflux   . Immune deficiency disorder     autoimmune disorder - polymyalgia - on prednisone  . Breast cancer     Treated with lumpectomy and radiation  . Giardia   . Pinworms    Past Surgical History  Procedure Laterality Date  . Breast lumpectomy    . Partial hysterectomy    . Tonsillectomy    . Tee without cardioversion N/A 07/20/2014    Procedure: TRANSESOPHAGEAL ECHOCARDIOGRAM (TEE);  Surgeon: Candee Furbish, MD;  Location: [facility] ENDOSCOPY;  Service:  Cardiovascular;  Laterality: N/A;   Social History:  reports that she has never smoked. She does not have any smokeless tobacco history on file. She reports that she does not drink alcohol or use illicit drugs.  Allergies  Allergen Reactions  . Doxycycline Nausea And Vomiting  . Other Nausea And Vomiting and Other (See Comments)    Anesthesia makes her very sleepy and makes it hard for her to come out of it.   Marland Kitchen Penicillins Hives and Nausea And Vomiting  . Sulfa Antibiotics Nausea And Vomiting    Family History  Problem Relation Age of Onset  . Stroke Mother      Prior to Admission medications   Medication Sig Start Date End Date Taking? Authorizing Provider  antiseptic oral rinse (BIOTENE) LIQD 15 mLs by Mouth Rinse route as needed for dry mouth.   Yes Historical Provider, MD  aspirin 81 MG chewable tablet Chew 1 tablet (81 mg total) by mouth daily. 07/24/14  Yes Barton Dubois, MD  benzonatate (TESSALON) 200 MG capsule Take 1 capsule (200 mg total) by mouth 3 (three) times daily as needed for cough. 07/24/14  Yes Barton Dubois, MD  fluconazole (DIFLUCAN) 100 MG tablet Take 100 mg by mouth daily.  07/09/14  Yes Historical Provider, MD  folic acid (FOLVITE) 1 MG tablet Take 1 mg by mouth daily.   Yes Historical Provider, MD  furosemide (LASIX) 20 MG tablet Take 1 tablet (20 mg total) by mouth 2 (two) times daily with breakfast and lunch. 07/24/14  Yes Barton Dubois, MD  hydroxypropyl methylcellulose / hypromellose (ISOPTO TEARS / GONIOVISC) 2.5 % ophthalmic solution Place 1 drop into both eyes every morning.   Yes Historical Provider, MD  levofloxacin (LEVAQUIN) 750 MG tablet Take 1 tablet (750 mg total) by mouth every other day. 07/25/14  Yes Barton Dubois, MD  loratadine (CLARITIN) 10 MG tablet Take 1 tablet (10 mg total) by mouth daily. 07/24/14  Yes Barton Dubois, MD  metoprolol succinate (TOPROL-XL) 12.5 mg TB24 24 hr tablet Take 0.5 tablets (12.5 mg total) by mouth daily. 07/24/14   Yes Barton Dubois, MD  potassium chloride SA (K-DUR,KLOR-CON) 20 MEQ tablet Take 2 tablets (40 mEq total) by mouth daily. 07/24/14  Yes Barton Dubois, MD  predniSONE (DELTASONE) 5 MG tablet Take 5 mg by mouth daily with breakfast.   Yes Historical Provider, MD  RABEprazole (ACIPHEX) 20 MG tablet Take 20 mg by mouth daily.   Yes Historical Provider, MD  saccharomyces boulardii (FLORASTOR) 250 MG capsule Take 1 capsule (250 mg total) by mouth 2 (two) times daily. 07/24/14  Yes Barton Dubois, MD   Physical Exam: Filed Vitals:   07/28/14 1142 07/28/14 1222 07/28/14 1226 07/28/14 1245  BP: 136/74 127/69    Pulse: 117 104 107   Temp: 100 F (37.8 C)   103 F (39.4 C)  TempSrc: Oral   Rectal  Resp: 17 26    SpO2: 90% 95%      Wt Readings from Last 3 Encounters:  07/24/14 64.501 kg (142 lb 3.2 oz)  07/24/14 64.501 kg (142 lb 3.2 oz)  06/04/14 63 kg (138 lb 14.2 oz)    General:  Appears calm and comfortable Eyes: PERRL, normal lids, irises & conjunctiva ENT: grossly normal hearing, lips & tongue Neck: no LAD, masses or thyromegaly Cardiovascular: RRR, no m/r/g. No LE edema. Telemetry: SR, no arrhythmias  Respiratory: Bilateral Crackles, Normal respiratory effort. Abdomen: soft, ntnd Skin: no rash or induration seen on limited exam Musculoskeletal: grossly normal tone BUE/BLE, right dry gangrene tip of toes 2 3 right foot Psychiatric: grossly normal mood and affect, speech fluent and appropriate Neurologic: grossly non-focal. lethargic but wake up and answer questions. Hard of hearing.           Labs on Admission:  Basic Metabolic Panel:  Recent Labs Lab 07/22/14 0607 07/23/14 0451 07/24/14 0346 07/28/14 1225  NA 131* 133* 134* 130*  K 3.8 4.6 3.4* 4.0  CL 95* 97 96 90*  CO2 25 22 24 21   GLUCOSE 112* 121* 127* 206*  BUN 11 12 12 17   CREATININE 0.60 0.54 0.57 0.70  CALCIUM 8.1* 8.1* 8.6 8.5  MG 1.7  --   --   --    Liver Function Tests:  Recent Labs Lab  07/28/14 1225  AST 18  ALT 24  ALKPHOS 119*  BILITOT 0.8  PROT 6.5  ALBUMIN 2.8*   No results found for this basename: LIPASE, AMYLASE,  in the last 168 hours No results found for this basename: AMMONIA,  in the last 168 hours CBC:  Recent Labs Lab 07/24/14 0346 07/28/14 1225  WBC 9.3 12.5*  NEUTROABS  --  8.4*  HGB 12.0 12.7  HCT 35.9* 37.7  MCV 87.1 87.5  PLT 180 232   Cardiac Enzymes:  Recent Labs Lab 07/28/14 1225  TROPONINI <0.30    BNP (last 3 results)  Recent Labs  06/02/14 1954 07/19/14 2039 07/28/14 1225  PROBNP 622.2* 1467.0* 576.5*   CBG: No results found for  this basename: GLUCAP,  in the last 168 hours  Radiological Exams on Admission: Dg Chest 2 View  07/28/2014   CLINICAL DATA:  Worsening hypoxia and lethargy. Recent pneumonia. Breast carcinoma.  EXAM: CHEST  2 VIEW  COMPARISON:  07/23/2014  FINDINGS: Diffuse pulmonary interstitial infiltrates show no significant change. No evidence of superimposed consolidation or pleural effusion. Cardiomegaly remains stable.  IMPRESSION: No significant change in cardiomegaly and diffuse bilateral pulmonary interstitial infiltrates.   Electronically Signed   By: Earle Gell M.D.   On: 07/28/2014 13:04    EKG: Independently reviewed. Sinus tachycardia.   Assessment/Plan Principal Problem:   Sepsis Active Problems:   Hyponatremia   Immune disorder   Fever   Hypoxia   Diastolic HF (heart failure)  1-Sepsis: Patient present with fever, BP in the 100 to 90 range, Lactic acid at 3.5. Chest x ray with persistent infiltrates. WBC at 12.  -Will continue with Vancomycin and cefepime for Health Care Associated PNA.  -Blood culture repeated.  -UA with trace leukocytes. Will check urine culture.  -Will give one time dose stress dose steroids. Follow BP trend.   2-Acute Hypoxic Respiratory Failure.  Continue with treatment of PNA.  Will change lasix to daily to avoid dehydration. Patient takes lasix BID at  home.  Patient will need Home oxygen evaluation prior to discharge.   3-Chronic Diastolic HF; chest x ray stable. BNP at 576, last admission at 1467. Patient appears compensated.  She took her morning dose of lasix today. Will resume lasix 11-01 and change to daily due to concern for sepsis.  If BP decrease will need to hold lasix.   4-Polymyalgia Rheumatica:  On prednisone 5 mg daily. Will give one time dose stress dose steroid.  leflunomide was discontinue during last admission.   5-Gangrenous toes on the right foot  -Unclear etiology at this time, pulses on the right foot palpable with good perfusion  -Follow up with vascular outpatient.  -stable.   6-Decubitus ulcer  Wound care consult.  7-Malnutrition; appetite improving. Ensure. Nutrition consult.   Code Status: Full Code.  DVT Prophylaxis:Lovenox.  Family Communication: Care discussed with Daughters.  Disposition Plan:Expect 3 to 4 days.   Time spent: 75 minutes.   Niel Hummer A [facility] Pager [phone]

## 2014-07-28 NOTE — Progress Notes (Addendum)
ANTIBIOTIC CONSULT NOTE - INITIAL  Pharmacy Consult for Vancomycin Indication: pneumonia  Allergies  Allergen Reactions  . Doxycycline Nausea And Vomiting  . Other Nausea And Vomiting and Other (See Comments)    Anesthesia makes her very sleepy and makes it hard for her to come out of it.   Marland Kitchen Penicillins Hives and Nausea And Vomiting  . Sulfa Antibiotics Nausea And Vomiting    Patient Measurements:     Vital Signs: Temp: 103 F (39.4 C) (10/31 1245) Temp Source: Rectal (10/31 1245) BP: 127/69 mmHg (10/31 1222) Pulse Rate: 107 (10/31 1226) Intake/Output from previous day:   Intake/Output from this shift:    Labs:  Recent Labs  07/28/14 1225  CREATININE 0.70   The CrCl is unknown because both a height and weight (above a minimum accepted value) are required for this calculation. No results found for this basename: Letta Median, VANCORANDOM, GENTTROUGH, GENTPEAK, GENTRANDOM, TOBRATROUGH, TOBRAPEAK, TOBRARND, AMIKACINPEAK, AMIKACINTROU, AMIKACIN,  in the last 72 hours   Microbiology: Recent Results (from the past 720 hour(s))  URINE CULTURE     Status: None   Collection Time    07/20/14  1:37 AM      Result Value Ref Range Status   Specimen Description URINE, RANDOM   Final   Special Requests NONE   Final   Culture  Setup Time     Final   Value: 07/20/2014 09:07     Performed at [facility]     Final   Value: 3,000 COLONIES/ML     Performed at [facility]   Culture     Final   Value: INSIGNIFICANT GROWTH     Performed at [facility]   Report Status 07/21/2014 FINAL   Final  CULTURE, EXPECTORATED SPUTUM-ASSESSMENT     Status: None   Collection Time    07/20/14  2:09 AM      Result Value Ref Range Status   Specimen Description Expect. Sput   Final   Special Requests NONE   Final   Sputum evaluation     Final   Value: THIS SPECIMEN IS ACCEPTABLE. RESPIRATORY CULTURE REPORT TO FOLLOW.   Report Status  07/20/2014 FINAL   Final  CULTURE, RESPIRATORY (NON-EXPECTORATED)     Status: None   Collection Time    07/20/14  2:09 AM      Result Value Ref Range Status   Specimen Description SPUTUM   Final   Special Requests NONE   Final   Gram Stain     Final   Value: RARE WBC PRESENT, PREDOMINANTLY MONONUCLEAR     NO SQUAMOUS EPITHELIAL CELLS SEEN     RARE GRAM POSITIVE COCCI     IN PAIRS IN CLUSTERS     Performed at [facility]   Culture     Final   Value: NORMAL OROPHARYNGEAL FLORA     Performed at [facility]   Report Status 07/22/2014 FINAL   Final  CULTURE, BLOOD (ROUTINE X 2)     Status: None   Collection Time    07/20/14  2:10 AM      Result Value Ref Range Status   Specimen Description BLOOD RIGHT ANTECUBITAL   Final   Special Requests BOTTLES DRAWN AEROBIC ONLY 2ML   Final   Culture  Setup Time     Final   Value: 07/20/2014 08:58     Performed at [facility]  Final   Value: NO GROWTH 5 DAYS     Performed at [facility]   Report Status 07/26/2014 FINAL   Final  CULTURE, BLOOD (ROUTINE X 2)     Status: None   Collection Time    07/20/14  2:15 AM      Result Value Ref Range Status   Specimen Description BLOOD L HAND   Final   Special Requests BOTTLES DRAWN AEROBIC ONLY 5 ML   Final   Culture  Setup Time     Final   Value: 07/20/2014 08:58     Performed at [facility]   Culture     Final   Value: NO GROWTH 5 DAYS     Performed at [facility]   Report Status 07/26/2014 FINAL   Final  OVA AND PARASITE EXAMINATION     Status: None   Collection Time    07/20/14  5:08 AM      Result Value Ref Range Status   Specimen Description STOOL   Final   Special Requests NONE   Final   Ova and parasites     Final   Value: NO OVA OR PARASITES SEEN     Performed at [facility]   Report Status 07/23/2014 FINAL   Final    Medical History: Past Medical History  Diagnosis Date  . Arthritis   . Polymyalgia    . Acid reflux   . Immune deficiency disorder     autoimmune disorder - polymyalgia - on prednisone  . Breast cancer     Treated with lumpectomy and radiation  . Giardia   . Pinworms     Assessment: 68 yoF discharged from hospital several days ago after being treated for HAP. Pt presents with worsening SOB, hypoxia, fever. Pharmacy consulted to dose Vancomycin for HCAP/sepsis.   10/31 >>Vanco >> 10/31 >>Cefepime x 1   Tmax: 103 WBCs: pending Renal: SCr 0.7 CrCl 42ml/min  10/31 blood: sent  Goal of Therapy:  Vancomycin trough level 15-20 mcg/ml Appropriate antibiotic dosing for renal function; eradication of infection  Plan:  Start Vancomycin 500mg  IV Q12H F/u continuation of Cefepime (x 1 dose ordered in ER) Measure antibiotic drug levels at steady state Follow up culture results  Kizzie Furnish, PharmD Pager: [phone] 07/28/2014 2:08 PM   Addendum: Pharmacy now consulted to dose Cefepime for HCAP as well.  Plan: Continue Cefepime 1g IV Q8H  Kizzie Furnish, PharmD Pager: [phone] 07/28/2014 3:18 PM

## 2014-07-29 ENCOUNTER — Inpatient Hospital Stay (HOSPITAL_COMMUNITY): Payer: Medicare Other

## 2014-07-29 DIAGNOSIS — R509 Fever, unspecified: Secondary | ICD-10-CM

## 2014-07-29 DIAGNOSIS — A190 Acute miliary tuberculosis of a single specified site: Secondary | ICD-10-CM

## 2014-07-29 DIAGNOSIS — B89 Unspecified parasitic disease: Secondary | ICD-10-CM

## 2014-07-29 DIAGNOSIS — I5032 Chronic diastolic (congestive) heart failure: Secondary | ICD-10-CM

## 2014-07-29 DIAGNOSIS — D721 Eosinophilia: Secondary | ICD-10-CM

## 2014-07-29 DIAGNOSIS — J849 Interstitial pulmonary disease, unspecified: Secondary | ICD-10-CM

## 2014-07-29 DIAGNOSIS — J9601 Acute respiratory failure with hypoxia: Secondary | ICD-10-CM

## 2014-07-29 DIAGNOSIS — R0902 Hypoxemia: Secondary | ICD-10-CM

## 2014-07-29 DIAGNOSIS — R0600 Dyspnea, unspecified: Secondary | ICD-10-CM | POA: Insufficient documentation

## 2014-07-29 DIAGNOSIS — J84112 Idiopathic pulmonary fibrosis: Secondary | ICD-10-CM

## 2014-07-29 LAB — CBC
HEMATOCRIT: 32.4 % — AB (ref 36.0–46.0)
Hemoglobin: 11.2 g/dL — ABNORMAL LOW (ref 12.0–15.0)
MCH: 29.7 pg (ref 26.0–34.0)
MCHC: 34.6 g/dL (ref 30.0–36.0)
MCV: 85.9 fL (ref 78.0–100.0)
Platelets: 208 10*3/uL (ref 150–400)
RBC: 3.77 MIL/uL — ABNORMAL LOW (ref 3.87–5.11)
RDW: 18 % — AB (ref 11.5–15.5)
WBC: 8.5 10*3/uL (ref 4.0–10.5)

## 2014-07-29 LAB — BASIC METABOLIC PANEL
Anion gap: 12 (ref 5–15)
BUN: 12 mg/dL (ref 6–23)
CALCIUM: 8.2 mg/dL — AB (ref 8.4–10.5)
CO2: 25 meq/L (ref 19–32)
Chloride: 94 mEq/L — ABNORMAL LOW (ref 96–112)
Creatinine, Ser: 0.55 mg/dL (ref 0.50–1.10)
GFR calc Af Amer: >90 mL/min (ref >90)
GFR calc non Af Amer: 82 mL/min — ABNORMAL LOW (ref >90)
GLUCOSE: 162 mg/dL — AB (ref 70–99)
Potassium: 3.3 mEq/L — ABNORMAL LOW (ref 3.7–5.3)
SODIUM: 131 meq/L — AB (ref 137–147)

## 2014-07-29 LAB — LACTIC ACID, PLASMA: Lactic Acid, Venous: 2.4 mmol/L — ABNORMAL HIGH (ref 0.5–2.2)

## 2014-07-29 LAB — PRO B NATRIURETIC PEPTIDE: PRO B NATRI PEPTIDE: 753.4 pg/mL — AB (ref 0–450)

## 2014-07-29 MED ORDER — VANCOMYCIN HCL IN DEXTROSE 1-5 GM/200ML-% IV SOLN
INTRAVENOUS | Status: AC
  Filled 2014-07-29: qty 200

## 2014-07-29 MED ORDER — FUROSEMIDE 10 MG/ML IJ SOLN
20.0000 mg | Freq: Every day | INTRAMUSCULAR | Status: DC
Start: 2014-07-29 — End: 2014-07-31
  Administered 2014-07-29 – 2014-07-31 (×3): 20 mg via INTRAVENOUS
  Filled 2014-07-29 (×3): qty 2

## 2014-07-29 MED ORDER — DEXTROSE 5 % IV SOLN
1.0000 g | Freq: Two times a day (BID) | INTRAVENOUS | Status: DC
  Administered 2014-07-29 – 2014-07-31 (×5): 1 g via INTRAVENOUS
  Filled 2014-07-29 (×5): qty 1

## 2014-07-29 NOTE — Plan of Care (Signed)
Problem: Consults Goal: Diabetes Guidelines if Diabetic/Glucose > 140 If diabetic or lab glucose is > 140 mg/dl - Initiate Diabetes/Hyperglycemia Guidelines & Document Interventions  Outcome: Not Applicable Date Met:  77/41/42  Problem: Phase I Progression Outcomes Goal: Voiding-avoid urinary catheter unless indicated Outcome: Completed/Met Date Met:  07/29/14

## 2014-07-29 NOTE — Consult Note (Signed)
[facility]  Total days of antibiotics 2        Day 2 cefepime        Day 2 vanco              Reason for Consult: interstitial pneumonia in setting of recent diagnosis for parasitic infection    Referring Physician: regalado  Principal Problem:   Sepsis Active Problems:   Hyponatremia   Immune disorder   Fever   Diastolic HF (heart failure)   Chronic diastolic heart failure   Acute respiratory failure with hypoxemia   Dyspnea   Pyrexia   Hypoxemia    HPI: Martha Mcbride is a 80 y.o. female F with hx of AS, diastolic HF, PMR on prednisone $RemoveBefor'5mg'coMByANYxAXu$  daily and leflunomide (last taken on 10/22)  who has had repeated hospitalizations over the last 2 months for fever of unknown origin. She is followed by Martha Mcbride for her PMR. Martha Mcbride as PCP. Patient prior to September reportedly was in good health, very physically active. Started to have fevers and repeat hospitalizations starting in Sept 2015. After her hospitalization in September for chest pain and nightsweats, she went to [facility] for evaluation of diarrhea. She was diagnosed with giardia, pinworm and ascaris per her daughter's report. She was given prescription for praziquantal and metronidazole to start on 10/22. Since she was feeling so poorly with fevers and shortness of breath, she was admitted on 10/22- 10/26 for hcap initially treated for 5 days with aztreonam and vancomycin then converted to levofloxacin as an outpatient but also given 1 dose of praziquantal on 10/24.It appears that the patient was to take 3 doses every other day, and had taken a dose on 10/22 prior to admit. The O and P and GI pathogen panel were negative on 10/23.the patient had not started taking metronidazole at this time.  During this hospitalization she underwent TEE on 10/23 which was negative for vegetations. Her serial blood cx have been negative, but unclear if all were drawn in setting of being off of antibiotics. 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 She was admitted on 10/22- 10/26 for hcap initially treated for 5 days with aztreonam and vancomycin then converted to levofloxacin as an outpatient but also given 1 dose of praziquantal on 10/24, to continue course of treatmetn for giardia, roundworm and pinworm   She is readmitted on 10/31 due to feeling poorly, confused, and low oxygenation by pulse ox at home, and noted to have an episode of rigors.in the ED she was found to have fever of 103F. Some hypoxia. cxr suggestive of diffuse pulmonary interstitial infiltrates (similar to cxr on 10/26 at prior discharge). Labs showed wbc of 12(67N, eos of 5% awith TEC of 0.6)  with lactic acid of 3.5. 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 she was started on vancomycin and cefepime plus fluc. Also evaluated by pulmonary to do bronchoscopy tomorrow.    Past Medical History  Diagnosis Date  . Arthritis   . Polymyalgia   . Acid reflux   . Immune deficiency disorder     autoimmune disorder - polymyalgia - on prednisone  . Breast cancer     Treated with lumpectomy and radiation  . Giardia   .  Pinworms     Allergies:  Allergies  Allergen Reactions  . Doxycycline Nausea And Vomiting  . Other Nausea And Vomiting and Other (See Comments)    Anesthesia makes her very sleepy and makes it hard for her to come out of it.   Marland Kitchen Penicillins Hives and Nausea And Vomiting  . Sulfa Antibiotics Nausea And Vomiting    MEDICATIONS: . antiseptic oral rinse  7 mL Mouth Rinse q12n4p  . aspirin  81 mg Oral Daily  . ceFEPime (MAXIPIME) IV  1 g Intravenous Q12H  . chlorhexidine  15 mL Mouth Rinse BID  . enoxaparin (LOVENOX) injection  40 mg Subcutaneous Q24H  . fluconazole  100 mg Oral Daily  . folic  acid  1 mg Oral Daily  . furosemide  20 mg Intravenous Daily  . loratadine  10 mg Oral Daily  . metoprolol succinate  12.5 mg Oral Daily  . pantoprazole  40 mg Oral Daily  . polyvinyl alcohol  1 drop Both Eyes Daily  . potassium chloride SA  40 mEq Oral Daily  . predniSONE  5 mg Oral Q breakfast  . saccharomyces boulardii  250 mg Oral BID  . sodium chloride  3 mL Intravenous Q12H  . vancomycin  500 mg Intravenous Q12H    History  Substance Use Topics  . Smoking status: Never Smoker   . Smokeless tobacco: Not on file  . Alcohol Use: No    Family History  Problem Relation Age of Onset  . Stroke Mother     Review of Systems  Constitutional: positive for chills, diaphoresis, activity change, appetite change, fatigue and unexpected weight change.  HENT: Negative for congestion, sore throat, rhinorrhea, sneezing, trouble swallowing and sinus pressure.  Eyes: Negative for photophobia and visual disturbance.  Respiratory: Negative for cough, chest tightness, shortness of breath, wheezing and stridor.  Cardiovascular: Negative for chest pain, palpitations and leg swelling.  Gastrointestinal: Negative for nausea, vomiting, abdominal pain, diarrhea, constipation, blood in stool, abdominal distention and anal bleeding.  Genitourinary: Negative for dysuria, hematuria, flank pain and difficulty urinating.  Musculoskeletal: Negative for myalgias, back pain, joint swelling, arthralgias and gait problem.  Skin: Negative for color change, pallor, rash and wound.  Neurological: Negative for dizziness, tremors, weakness and light-headedness.  Hematological: Negative for adenopathy. Does not bruise/bleed easily.  Psychiatric/Behavioral: Negative for behavioral problems, confusion, sleep disturbance, dysphoric mood, decreased concentration and agitation.     OBJECTIVE: Temp:  [98 F (36.7 C)-98.6 F (37 C)] 98 F (36.7 C) (11/01 1200) Pulse Rate:  [81-95] 93 (11/01 1300) Resp:  [12-29] 29  (11/01 1300) BP: (93-169)/(47-81) 136/59 mmHg (11/01 0800) SpO2:  [80 %-99 %] 91 % (11/01 1300) Weight:  [142 lb 10.2 oz (64.7 kg)-146 lb 2.6 oz (66.3 kg)] 146 lb 2.6 oz (66.3 kg) (11/01 0200) Physical Exam  Constitutional:  oriented to person, place, and time. appears well-developed and well-nourished. No distress.  HENT:  Mouth/Throat: Oropharynx is clear and moist. No oropharyngeal exudate.  Cardiovascular: Normal rate, regular rhythm and normal heart sounds. Exam reveals no gallop and no friction rub.  No murmur heard.  Pulmonary/Chest: Effort normal and breath sounds normal. No respiratory distress. Bilateral fine crackles throughout lung fields Abdominal: Soft. Bowel sounds are normal.  exhibits no distension. There is no tenderness.  Lymphadenopathy: no cervical adenopathy.  Neurological: alert and oriented to person, place, and time.  Skin: right foot distal aspect of 2nd and 3rd toe, gangrenous, dry/ no pain, no erythema. Psychiatric: a normal  mood and affect.  behavior is normal.   LABS: Results for orders placed or performed during the hospital encounter of 07/28/14 (from the past 48 hour(s))  Blood culture (routine x 2)     Status: None (Preliminary result)   Collection Time: 07/28/14 12:21 PM  Result Value Ref Range   Specimen Description BLOOD LEFT WRIST  4 ML IN [facility] BOTTLE    Special Requests NONE    Culture  Setup Time      07/28/2014 20:11 Performed at [facility]    Culture             BLOOD CULTURE RECEIVED NO GROWTH TO DATE CULTURE WILL BE HELD FOR 5 DAYS BEFORE ISSUING A FINAL NEGATIVE REPORT Performed at [facility]    Report Status PENDING   CBC with Differential     Status: Abnormal   Collection Time: 07/28/14 12:25 PM  Result Value Ref Range   WBC 12.5 (H) 4.0 - 10.5 K/uL   RBC 4.31 3.87 - 5.11 MIL/uL   Hemoglobin 12.7 12.0 - 15.0 g/dL   HCT 03.9 79.5 - 36.9 %   MCV 87.5 78.0 - 100.0 fL   MCH 29.5 26.0 - 34.0 pg   MCHC 33.7 30.0 -  36.0 g/dL   RDW 22.3 (H) 00.9 - 79.4 %   Platelets 232 150 - 400 K/uL   Neutrophils Relative % 67 43 - 77 %   Lymphocytes Relative 16 12 - 46 %   Monocytes Relative 11 3 - 12 %   Eosinophils Relative 5 0 - 5 %   Basophils Relative 1 0 - 1 %   Neutro Abs 8.4 (H) 1.7 - 7.7 K/uL   Lymphs Abs 2.0 0.7 - 4.0 K/uL   Monocytes Absolute 1.4 (H) 0.1 - 1.0 K/uL   Eosinophils Absolute 0.6 0.0 - 0.7 K/uL   Basophils Absolute 0.1 0.0 - 0.1 K/uL   RBC Morphology POLYCHROMASIA PRESENT   Comprehensive metabolic panel     Status: Abnormal   Collection Time: 07/28/14 12:25 PM  Result Value Ref Range   Sodium 130 (L) 137 - 147 mEq/L   Potassium 4.0 3.7 - 5.3 mEq/L   Chloride 90 (L) 96 - 112 mEq/L   CO2 21 19 - 32 mEq/L   Glucose, Bld 206 (H) 70 - 99 mg/dL   BUN 17 6 - 23 mg/dL   Creatinine, Ser 9.97 0.50 - 1.10 mg/dL   Calcium 8.5 8.4 - 18.2 mg/dL   Total Protein 6.5 6.0 - 8.3 g/dL   Albumin 2.8 (L) 3.5 - 5.2 g/dL   AST 18 0 - 37 U/L   ALT 24 0 - 35 U/L   Alkaline Phosphatase 119 (H) 39 - 117 U/L   Total Bilirubin 0.8 0.3 - 1.2 mg/dL   GFR calc non Af Amer 76 (L) >90 mL/min   GFR calc Af Amer 88 (L) >90 mL/min    Comment: (NOTE) The eGFR has been calculated using the CKD EPI equation. This calculation has not been validated in all clinical situations. eGFR's persistently <90 mL/min signify possible Chronic Kidney Disease.   Anion gap 19 (H) 5 - 15  Lactic acid, plasma     Status: Abnormal   Collection Time: 07/28/14 12:25 PM  Result Value Ref Range   Lactic Acid, Venous 3.5 (H) 0.5 - 2.2 mmol/L  Pro b natriuretic peptide     Status: Abnormal   Collection Time: 07/28/14 12:25 PM  Result Value Ref Range  Pro B Natriuretic peptide (BNP) 576.5 (H) 0 - 450 pg/mL  Troponin I     Status: None   Collection Time: 07/28/14 12:25 PM  Result Value Ref Range   Troponin I <0.30 <0.30 ng/mL    Comment:        Due to the release kinetics of cTnI, a negative result within the first hours of the  onset of symptoms does not rule out myocardial infarction with certainty. If myocardial infarction is still suspected, repeat the test at appropriate intervals.  Hemoglobin A1c     Status: Abnormal   Collection Time: 07/28/14 12:25 PM  Result Value Ref Range   Hemoglobin A1C 6.4 (H) <5.7 %    Comment: (NOTE)                                                                       According to the ADA Clinical Practice Recommendations for 2011, when HbA1c is used as a screening test:  >=6.5%   Diagnostic of Diabetes Mellitus           (if abnormal result is confirmed) 5.7-6.4%   Increased risk of developing Diabetes Mellitus References:Diagnosis and Classification of Diabetes Mellitus,Diabetes RJJO,8416,60(YTKZS 1):S62-S69 and Standards of Medical Care in         Diabetes - 2011,Diabetes WFUX,3235,57 (Suppl 1):S11-S61.   Mean Plasma Glucose 137 (H) <117 mg/dL    Comment: Performed at [facility]  Blood culture (routine x 2)     Status: None (Preliminary result)   Collection Time: 07/28/14 12:49 PM  Result Value Ref Range   Specimen Description BLOOD BLOOD LEFT FOREARM    Special Requests BOTTLES DRAWN AEROBIC AND ANAEROBIC 5ML    Culture  Setup Time      07/28/2014 20:10 Performed at [facility] NO GROWTH TO DATE CULTURE WILL BE HELD FOR 5 DAYS BEFORE ISSUING A FINAL NEGATIVE REPORT Performed at [facility]    Report Status PENDING   Urinalysis, Routine w reflex microscopic     Status: Abnormal   Collection Time: 07/28/14  2:03 PM  Result Value Ref Range   Color, Urine YELLOW YELLOW   APPearance CLEAR CLEAR   Specific Gravity, Urine 1.013 1.005 - 1.030   pH 6.0 5.0 - 8.0   Glucose, UA NEGATIVE NEGATIVE mg/dL   Hgb urine dipstick NEGATIVE NEGATIVE   Bilirubin Urine NEGATIVE NEGATIVE   Ketones, ur NEGATIVE NEGATIVE mg/dL   Protein, ur NEGATIVE NEGATIVE mg/dL   Urobilinogen, UA 1.0 0.0 - 1.0 mg/dL    Nitrite NEGATIVE NEGATIVE   Leukocytes, UA TRACE (A) NEGATIVE  Urine microscopic-add on     Status: None   Collection Time: 07/28/14  2:03 PM  Result Value Ref Range   Squamous Epithelial / LPF RARE RARE   WBC, UA 0-2 <3 WBC/hpf  MRSA PCR Screening     Status: None   Collection Time: 07/28/14  3:52 PM  Result Value Ref Range   MRSA by PCR NEGATIVE NEGATIVE    Comment:        The GeneXpert MRSA Assay (FDA approved for NASAL specimens only), is one  component of a comprehensive MRSA colonization surveillance program. It is not intended to diagnose MRSA infection nor to guide or monitor treatment for MRSA infections.  Culture, expectorated sputum-assessment     Status: None   Collection Time: 07/28/14  4:21 PM  Result Value Ref Range   Specimen Description SPUTUM    Special Requests Immunocompromised    Sputum evaluation      THIS SPECIMEN IS ACCEPTABLE. RESPIRATORY CULTURE REPORT TO FOLLOW.   Report Status 07/28/2014 FINAL   Culture, respiratory (NON-Expectorated)     Status: None (Preliminary result)   Collection Time: 07/28/14  4:21 PM  Result Value Ref Range   Specimen Description SPUTUM    Special Requests NONE    Gram Stain PENDING    Culture      Culture reincubated for better growth Performed at [facility]    Report Status PENDING   Basic metabolic panel     Status: Abnormal   Collection Time: 07/29/14  3:30 AM  Result Value Ref Range   Sodium 131 (L) 137 - 147 mEq/L   Potassium 3.3 (L) 3.7 - 5.3 mEq/L    Comment: DELTA CHECK NOTED REPEATED TO VERIFY    Chloride 94 (L) 96 - 112 mEq/L   CO2 25 19 - 32 mEq/L   Glucose, Bld 162 (H) 70 - 99 mg/dL   BUN 12 6 - 23 mg/dL   Creatinine, Ser 0.55 0.50 - 1.10 mg/dL   Calcium 8.2 (L) 8.4 - 10.5 mg/dL   GFR calc non Af Amer 82 (L) >90 mL/min   GFR calc Af Amer >90 >90 mL/min    Comment: (NOTE) The eGFR has been calculated using the CKD EPI equation. This calculation has not been validated in all clinical  situations. eGFR's persistently <90 mL/min signify possible Chronic Kidney Disease.    Anion gap 12 5 - 15  CBC     Status: Abnormal   Collection Time: 07/29/14  3:30 AM  Result Value Ref Range   WBC 8.5 4.0 - 10.5 K/uL   RBC 3.77 (L) 3.87 - 5.11 MIL/uL   Hemoglobin 11.2 (L) 12.0 - 15.0 g/dL   HCT 32.4 (L) 36.0 - 46.0 %   MCV 85.9 78.0 - 100.0 fL   MCH 29.7 26.0 - 34.0 pg   MCHC 34.6 30.0 - 36.0 g/dL   RDW 18.0 (H) 11.5 - 15.5 %   Platelets 208 150 - 400 K/uL  Lactic acid, plasma     Status: Abnormal   Collection Time: 07/29/14  3:30 AM  Result Value Ref Range   Lactic Acid, Venous 2.4 (H) 0.5 - 2.2 mmol/L  Pro b natriuretic peptide     Status: Abnormal   Collection Time: 07/29/14  3:30 AM  Result Value Ref Range   Pro B Natriuretic peptide (BNP) 753.4 (H) 0 - 450 pg/mL    MICRO: 10/31 blood cx pending 10/23 blood cx ngtd 9/6 blood cx ngtd 10/23 o x p negative 10/23 gi pathogen panel negative  IMAGING: Dg Chest 2 View  07/28/2014   CLINICAL DATA:  Worsening hypoxia and lethargy. Recent pneumonia. Breast carcinoma.  EXAM: CHEST  2 VIEW  COMPARISON:  07/23/2014  FINDINGS: Diffuse pulmonary interstitial infiltrates show no significant change. No evidence of superimposed consolidation or pleural effusion. Cardiomegaly remains stable.  IMPRESSION: No significant change in cardiomegaly and diffuse bilateral pulmonary interstitial infiltrates.   Electronically Signed   By: Earle Gell M.D.   On: 07/28/2014 13:04   Dg  Chest Port 1 View  07/29/2014   CLINICAL DATA:  Hypoxia.  History of breast cancer.  EXAM: PORTABLE CHEST - 1 VIEW  COMPARISON:  07/28/2014.  FINDINGS: Mediastinum hilar structures normal. Mild cardiomegaly. Diffuse severe from interstitial infiltrates are present. Small right pleural effusion. These findings could be related to congestive heart for and interstitial edema severe pneumonitis could also present in this fashion. Interstitial tumor spread could present in  this fashion. No acute bony abnormality identified.  IMPRESSION: 1. Severe bilateral pulmonary interstitial infiltrates. Small right pleural effusion. These findings may be related to congestive heart failure an interstitial edema. Severe interstitial pneumonitis could present in this fashion. Interstitial tumor spread could present in this fashion. 2. Cardiomegaly.   Electronically Signed   By: Marcello Moores  Register   On: 07/29/2014 08:25    HISTORICAL MICRO/IMAGING  Assessment/Plan:  79 yo F on chronic low dose steroids plus leflonamide for polymyaliga rheumatica presents with fever, worsening dyspnea found to have interstitial pneumonia. Also carries diagnosis of poly parasitic infection with ascaris, enterobiasis, and giardia, received praziquantal, possibly 2 doses in late October.  Possible diagnosis of ascaris = will need to track down records from [facility] to determine which tests where conducted to have this diagnosis. Repeat testing on 10/23 (without treatment), showed no evidence of giardia, and nothing on O and P. The prescription of taking praziquantal is also suspect since this is not the drug of choice for treatment for these soil transmitted helminths.  - recommend to repeat o and p - recommend to check for strongyloides antibody - will get stool to be concentrated to check for strongyloides - get records of test results from [facility] center - recommend not to give any empiric anti-helminthic treatment at this time until we can re-establish diagnosis  Eosinophilia = appears that she has had low level eosinophilia dating back since 2013. It is not markedly elevated at this time. Recommend to check to see if also on BAL.  Interstitial pneumonitis = can be caused by ascaris or strongyloides, but usually in the acute phase of infection. Agree with getting BAL to see if any eosinophilia. Recent steroids may eliminate eos in BA. Eosinophilic pneumonia has been associated with  leflunomide, however this was stopped on 10/22. Will defer to pulmonary for further management  Fevers = recommend to do work up for FUO, will check quantiferon.  Dr. Linus Salmons to see in the morning  Andriana Casa B. [facility] [phone]

## 2014-07-29 NOTE — Plan of Care (Signed)
Problem: Phase I Progression Outcomes Goal: Pain controlled with appropriate interventions Outcome: Completed/Met Date Met:  07/29/14

## 2014-07-29 NOTE — Progress Notes (Signed)
Speech Language Pathology  Patient Details Name: Martha Mcbride MRN: [id number] DOB: 1924-12-27 Today's Date: 07/29/2014 Time:  -    Received order for swallow assessment written today.  RN called who reported pt. has not exhibited s/s aspiration; SLP noted CXR 11/1 with infiltrated (possible silent aspiration?) SLP will assess next date.  Informed RN to initiate thickened liquids versus make NPO if difficulties arise.   Orbie Pyo [place].[employer] [phone]

## 2014-07-29 NOTE — Consult Note (Signed)
PULMONARY / CRITICAL CARE MEDICINE   Name: Martha Mcbride MRN: [id number] DOB: 14-Aug-1925    ADMISSION DATE:  07/28/2014 CONSULTATION DATE:  07/28/2014   REFERRING MD :  Tyrell Antonio  CHIEF COMPLAINT:  Shortness of breath  INITIAL PRESENTATION: 80 y/o female with polymyalgia on chronic prednisone and leflunomide admitted on 10/31 to [facility] with acute hypoxemic respiratory failure and diffuse bilateral infiltrates.  STUDIES:    SIGNIFICANT EVENTS:    HISTORY OF PRESENT ILLNESS:  This is a very pleasant 80 year old female with a past medical history significant for polymyalgia rheumatica was admitted to [facility] hospital on 07/28/2014 with acute hypoxemic respiratory failure and diffuse bilateral infiltrates. Pulmonary critical care medicine was consulted for further evaluation. She has a long history of being treated for polymyalgia rheumatica by a rheumatologist here in town. She has been treated with methotrexate as well as other injectable therapies in the past. However for over one year she had been treated with prednisone and leflunomide. For the most part her symptoms have been well controlled and she had been fairly healthy up until September 2015. She was admitted for evaluation of chest pain in September 2015. During that time she had an echocardiogram which demonstrated aortic stenosis and she had a stress test which reportedly was normal.  Her daughter notes that she had a fever during this admission.  She was discharged home. Not long after discharge she had a diffuse rash which was treated by her primary care physician with prednisone.  However after discharge she continued to have malaise, nausea and a lack of energy. She was seen by her primary care physician as well as an urgent care facility and because of some diarrhea she had a stool test sent for parasitic organisms. She was found to have Ascaris in her stool (by report I do not have this result available for my own  review).apparently she was started on treatment for this but did not complete treatment. She was admitted to the hospital again about a week ago for a gangrenous toe. Apparently she had a diffuse rash again shortly before this admission.  She underwent a fairly lengthy workup which included a transesophageal echocardiogram which showed no evidence of endocarditis. She was diagnosed with healthcare associated pneumonia and was discharged home on Levaquin. At home her shortness of breath never really improved and she continued to cough up gray, clumpy sputum. Her family blot O2 sat monitor and found that her oxygen saturation was as low as 82% on room air. So they brought her back to the hospital for further evaluation. In the emergency room at [facility] she was found to be hypoxemic, have diffuse infiltrates on her chest x-ray and she had a fever to 103.  PAST MEDICAL HISTORY :   has a past medical history of Arthritis; Polymyalgia; Acid reflux; Immune deficiency disorder; Breast cancer; Giardia; and Pinworms.  has past surgical history that includes Breast lumpectomy; Partial hysterectomy; Tonsillectomy; and TEE without cardioversion (N/A, 07/20/2014). Prior to Admission medications   Medication Sig Start Date End Date Taking? Authorizing Provider  antiseptic oral rinse (BIOTENE) LIQD 15 mLs by Mouth Rinse route as needed for dry mouth.   Yes Historical Provider, MD  aspirin 81 MG chewable tablet Chew 1 tablet (81 mg total) by mouth daily. 07/24/14  Yes Barton Dubois, MD  benzonatate (TESSALON) 200 MG capsule Take 1 capsule (200 mg total) by mouth 3 (three) times daily as needed for cough. 07/24/14  Yes Barton Dubois, MD  fluconazole (DIFLUCAN) 100 MG tablet Take 100 mg by mouth daily.  07/09/14  Yes Historical Provider, MD  folic acid (FOLVITE) 1 MG tablet Take 1 mg by mouth daily.   Yes Historical Provider, MD  furosemide (LASIX) 20 MG tablet Take 1 tablet (20 mg total) by mouth 2 (two)  times daily with breakfast and lunch. 07/24/14  Yes Barton Dubois, MD  hydroxypropyl methylcellulose / hypromellose (ISOPTO TEARS / GONIOVISC) 2.5 % ophthalmic solution Place 1 drop into both eyes every morning.   Yes Historical Provider, MD  levofloxacin (LEVAQUIN) 750 MG tablet Take 1 tablet (750 mg total) by mouth every other day. 07/25/14  Yes Barton Dubois, MD  loratadine (CLARITIN) 10 MG tablet Take 1 tablet (10 mg total) by mouth daily. 07/24/14  Yes Barton Dubois, MD  metoprolol succinate (TOPROL-XL) 12.5 mg TB24 24 hr tablet Take 0.5 tablets (12.5 mg total) by mouth daily. 07/24/14  Yes Barton Dubois, MD  potassium chloride SA (K-DUR,KLOR-CON) 20 MEQ tablet Take 2 tablets (40 mEq total) by mouth daily. 07/24/14  Yes Barton Dubois, MD  predniSONE (DELTASONE) 5 MG tablet Take 5 mg by mouth daily with breakfast.   Yes Historical Provider, MD  RABEprazole (ACIPHEX) 20 MG tablet Take 20 mg by mouth daily.   Yes Historical Provider, MD  saccharomyces boulardii (FLORASTOR) 250 MG capsule Take 1 capsule (250 mg total) by mouth 2 (two) times daily. 07/24/14  Yes Barton Dubois, MD   Allergies  Allergen Reactions  . Doxycycline Nausea And Vomiting  . Other Nausea And Vomiting and Other (See Comments)    Anesthesia makes her very sleepy and makes it hard for her to come out of it.   Marland Kitchen Penicillins Hives and Nausea And Vomiting  . Sulfa Antibiotics Nausea And Vomiting    FAMILY HISTORY:  has no family status information on file.  SOCIAL HISTORY:  reports that she has never smoked. She does not have any smokeless tobacco history on file. She reports that she does not drink alcohol or use illicit drugs.  REVIEW OF SYSTEMS:   Gen: + fever, denies chills, weight change, + fatigue, night sweats HEENT: Denies blurred vision, double vision, hearing loss, tinnitus, sinus congestion, rhinorrhea, sore throat, neck stiffness, dysphagia PULM: Per HPI CV: Denies chest pain, edema, orthopnea, paroxysmal  nocturnal dyspnea, palpitations GI: Denies abdominal pain, nausea, vomiting, diarrhea, hematochezia, melena, constipation, change in bowel habits GU: Denies dysuria, hematuria, polyuria, oliguria, urethral discharge Endocrine: Denies hot or cold intolerance, polyuria, polyphagia or appetite change Derm: Denies rash, dry skin, scaling or peeling skin change Heme: Denies easy bruising, bleeding, bleeding gums Neuro: Denies headache, numbness, weakness, slurred speech, loss of memory or consciousness   SUBJECTIVE:   VITAL SIGNS: Temp:  [98.3 F (36.8 C)-103 F (39.4 C)] 98.5 F (36.9 C) (11/01 0800) Pulse Rate:  [81-117] 84 (11/01 0800) Resp:  [12-26] 17 (11/01 0800) BP: (93-169)/(47-81) 136/59 mmHg (11/01 0800) SpO2:  [80 %-99 %] 93 % (11/01 0800) Weight:  [64.7 kg (142 lb 10.2 oz)-66.3 kg (146 lb 2.6 oz)] 66.3 kg (146 lb 2.6 oz) (11/01 0200) HEMODYNAMICS:   VENTILATOR SETTINGS:   INTAKE / OUTPUT:  Intake/Output Summary (Last 24 hours) at 07/29/14 9678 Last data filed at 07/29/14 0800  Gross per 24 hour  Intake    330 ml  Output   1450 ml  Net  -1120 ml    PHYSICAL EXAMINATION:  Gen: comfortable in bed, no acute distress HEENT: NCAT, EOMi, OP clear, neck supple without  masses PULM: Crackles bilaterally throughout both sides CV: RRR, loud systolic murmur, no JVD AB: BS+, soft, nontender, no hsm Ext: warm, trace edema, no clubbing, no cyanosis Derm: no rash or skin breakdown Neuro: A&Ox4, CN II-XII intact, MAEW   LABS:  CBC  Recent Labs Lab 07/24/14 0346 07/28/14 1225 07/29/14 0330  WBC 9.3 12.5* 8.5  HGB 12.0 12.7 11.2*  HCT 35.9* 37.7 32.4*  PLT 180 232 208   Coag's No results for input(s): APTT, INR in the last 168 hours. BMET  Recent Labs Lab 07/24/14 0346 07/28/14 1225 07/29/14 0330  NA 134* 130* 131*  K 3.4* 4.0 3.3*  CL 96 90* 94*  CO2 24 21 25   BUN 12 17 12   CREATININE 0.57 0.70 0.55  GLUCOSE 127* 206* 162*   Electrolytes  Recent  Labs Lab 07/24/14 0346 07/28/14 1225 07/29/14 0330  CALCIUM 8.6 8.5 8.2*   Sepsis Markers  Recent Labs Lab 07/28/14 1225 07/29/14 0330  LATICACIDVEN 3.5* 2.4*   ABG No results for input(s): PHART, PCO2ART, PO2ART in the last 168 hours. Liver Enzymes  Recent Labs Lab 07/28/14 1225  AST 18  ALT 24  ALKPHOS 119*  BILITOT 0.8  ALBUMIN 2.8*   Cardiac Enzymes  Recent Labs Lab 07/28/14 1225 07/29/14 0330  TROPONINI <0.30  --   PROBNP 576.5* 753.4*   Glucose No results for input(s): GLUCAP in the last 168 hours.  Imaging Dg Chest 2 View  07/28/2014   CLINICAL DATA:  Worsening hypoxia and lethargy. Recent pneumonia. Breast carcinoma.  EXAM: CHEST  2 VIEW  COMPARISON:  07/23/2014  FINDINGS: Diffuse pulmonary interstitial infiltrates show no significant change. No evidence of superimposed consolidation or pleural effusion. Cardiomegaly remains stable.  IMPRESSION: No significant change in cardiomegaly and diffuse bilateral pulmonary interstitial infiltrates.   Electronically Signed   By: Earle Gell M.D.   On: 07/28/2014 13:04     ASSESSMENT / PLAN:  Principal Problem:   Sepsis Active Problems:   Hyponatremia   Immune disorder   Fever   Diastolic HF (heart failure)   Chronic diastolic heart failure   Acute respiratory failure with hypoxemia   This is a very interesting case. Objectively, we have a lady with hypoxemic respiratory failure, diffuse interstitial infiltrates, recent eosinophilia and evidence of possible embolic phenomena with (apparently) documented Ascaris in her stool. This is all in the setting of an immunocompromised host as she has been treated with leflunomide and prednisone for her polymyalgia rheumatica. She also has aortic stenosis.  The differential diagnosis is broad but I think from my standpoint the likelihood of pulmonary Ascaris is high, but we should also consider other opportunistic infections considering the fact that she is  immunocompromised. One would also consider chronic eosinophilic pneumonia considering the high serum eosinophil count from over a week ago, however in the presence of a parasitic infection we cannot make this diagnosis. While healthcare associated pneumonia from bacterial pathogens and viral pathogens are also worth considering, I think we need to focus further diagnostic testing on the evaluation of Ascaris and other opportunistic infections.  If the BAL is negative then she may need a CT to consider ILD.  Plan: -Bronchoscopy on 07/30/2014 -We'll plan on performing a BAL which we will send for bacterial, viral, fungal, AFB, PCP, cell count with differential, and cytology -Nothing by mouth after midnight tonight -Continue oxygen as written for now -Continue current therapy as written -I would like to obtain the records of the positive stool test,  will ask family to provide this   Roselie Awkward, MD [place] [facility] Pager: [phone] Cell: [phone] If no response, call [phone]   07/29/2014, 9:38 AM

## 2014-07-29 NOTE — Progress Notes (Signed)
[facility] PROGRESS NOTE  Martha Mcbride FBP:[id number] DOB: January 03, 1925 DOA: 07/28/2014 PCP: Simona Huh, MD  Assessment/Plan: 1-Sepsis: Patient present with fever, BP in the 100 to 90 range, Lactic acid at 3.5. Chest x ray with persistent infiltrates. WBC at 12.  -Will continue with Vancomycin and cefepime for Health Care Associated PNA.  -Blood culture pending.  -UA with trace leukocytes.  urine culture pending.  -Lactic acid decrease to 2.4---from 3.5.   2-Acute Hypoxic Respiratory Failure.  Chest x ray 11-01 showed worsening infiltrates: pulmonary edema, Pneumonitis.  CCM consulted for further evaluation of pneumonitis.  Will defer steroid to pulmonary. Continue with Vancomycin and cefepime.  Continue with treatment of PNA.  Patient will need Home oxygen evaluation prior to discharge.   3-History of giardia, Ascaris Lumbricoids: didn't finish treatment. 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 \ ID consulted for treatment recommendation and further evaluation. Also pulmonary consulted.  Of note patient had stool culture negative for parasites last admission.   -Chronic Diastolic HF;   BNP at 824, last admission at 1467. Patient appears compensated.  Chest x ray with worsening infiltrates, edema, pneumonitis. Will change lasix to 20 Mg IV daily.   4-Polymyalgia Rheumatica:  On prednisone 5 mg daily. Received one time dose stress dose steroid.  leflunomide was discontinue during last admission.   5-Gangrenous toes on the right foot  -Unclear etiology at this time, pulses on the right foot palpable with good perfusion  -Follow up with vascular outpatient.  -stable.   6-Decubitus ulcer  Wound care consult.   7-Malnutrition; appetite improving. Ensure. Nutrition consult.   Code Status: Full Code.  Family Communication: Care discussed with Daughter.  Disposition Plan: Remain in the step down unit.    Consultants:  Pulmonary  ID.    Procedures:  none  Antibiotics:  Vancomycin   Cefepime   HPI/Subjective: Patient feeling better today, no worsening dyspnea.  No diarrhea.  Daughter concern that patient never finished treatment for Ascaris lumbricoids.   Objective: Filed Vitals:   07/29/14 0800  BP: 136/59  Pulse: 84  Temp: 98.5 F (36.9 C)  Resp: 17    Intake/Output Summary (Last 24 hours) at 07/29/14 0923 Last data filed at 07/29/14 0800  Gross per 24 hour  Intake    330 ml  Output   1450 ml  Net  -1120 ml   Filed Weights   07/28/14 2000 07/29/14 0200  Weight: 64.7 kg (142 lb 10.2 oz) 66.3 kg (146 lb 2.6 oz)    Exam:   General:  No distress.   Cardiovascular: S 1, S 2 RRR  Respiratory:Bilateral crackles.   Abdomen: BS present, soft. Nt  Musculoskeletal: no edema.   Data Reviewed: Basic Metabolic Panel:  Recent Labs Lab 07/23/14 0451 07/24/14 0346 07/28/14 1225 07/29/14 0330  NA 133* 134* 130* 131*  K 4.6 3.4* 4.0 3.3*  CL 97 96 90* 94*  CO2 22 24 21 25   GLUCOSE 121* 127* 206* 162*  BUN 12 12 17 12   CREATININE 0.54 0.57 0.70 0.55  CALCIUM 8.1* 8.6 8.5 8.2*   Liver Function Tests:  Recent Labs Lab 07/28/14 1225  AST 18  ALT 24  ALKPHOS 119*  BILITOT 0.8  PROT 6.5  ALBUMIN 2.8*   No results for input(s): LIPASE, AMYLASE in the last 168 hours. No results for input(s): AMMONIA in the last 168 hours. CBC:  Recent Labs Lab 07/24/14 0346 07/28/14 1225 07/29/14 0330  WBC 9.3 12.5* 8.5  NEUTROABS  --  8.4*  --   HGB 12.0 12.7 11.2*  HCT 35.9* 37.7 32.4*  MCV 87.1 87.5 85.9  PLT 180 232 208   Cardiac Enzymes:  Recent Labs Lab 07/28/14 1225  TROPONINI <0.30   BNP (last 3 results)  Recent Labs  06/02/14 1954 07/19/14 2039 07/28/14 1225  PROBNP 622.2* 1467.0* 576.5*   CBG: No results for input(s): GLUCAP in the last 168 hours.  Recent Results (from the past 240 hour(s))  Urine culture     Status: None   Collection Time: 07/20/14  1:37 AM   Result Value Ref Range Status   Specimen Description URINE, RANDOM  Final   Special Requests NONE  Final   Culture  Setup Time   Final    07/20/2014 09:07 Performed at [facility]   Final    3,000 COLONIES/ML Performed at [facility]   Culture   Final    INSIGNIFICANT GROWTH Performed at [facility]   Report Status 07/21/2014 FINAL  Final  Culture, expectorated sputum-assessment     Status: None   Collection Time: 07/20/14  2:09 AM  Result Value Ref Range Status   Specimen Description Expect. Sput  Final   Special Requests NONE  Final   Sputum evaluation   Final    THIS SPECIMEN IS ACCEPTABLE. RESPIRATORY CULTURE REPORT TO FOLLOW.   Report Status 07/20/2014 FINAL  Final  Culture, respiratory (NON-Expectorated)     Status: None   Collection Time: 07/20/14  2:09 AM  Result Value Ref Range Status   Specimen Description SPUTUM  Final   Special Requests NONE  Final   Gram Stain   Final    RARE WBC PRESENT, PREDOMINANTLY MONONUCLEAR NO SQUAMOUS EPITHELIAL CELLS SEEN RARE GRAM POSITIVE COCCI IN PAIRS IN CLUSTERS Performed at [facility]   Culture   Final    NORMAL OROPHARYNGEAL FLORA Performed at [facility]   Report Status 07/22/2014 FINAL  Final  Culture, blood (routine x 2)     Status: None   Collection Time: 07/20/14  2:10 AM  Result Value Ref Range Status   Specimen Description BLOOD RIGHT ANTECUBITAL  Final   Special Requests BOTTLES DRAWN AEROBIC ONLY 2ML  Final   Culture  Setup Time   Final    07/20/2014 08:58 Performed at [place]   Final    NO GROWTH 5 DAYS Performed at [facility]   Report Status 07/26/2014 FINAL  Final  Culture, blood (routine x 2)     Status: None   Collection Time: 07/20/14  2:15 AM  Result Value Ref Range Status   Specimen Description BLOOD L HAND  Final   Special Requests BOTTLES DRAWN AEROBIC ONLY 5 ML  Final   Culture  Setup Time   Final     07/20/2014 08:58 Performed at [place]   Final    NO GROWTH 5 DAYS Performed at [facility]   Report Status 07/26/2014 FINAL  Final  Ova and parasite examination     Status: None   Collection Time: 07/20/14  5:08 AM  Result Value Ref Range Status   Specimen Description STOOL  Final   Special Requests NONE  Final   Ova and parasites   Final    NO OVA OR PARASITES SEEN Performed at [facility]   Report Status 07/23/2014 FINAL  Final  MRSA PCR Screening  Status: None   Collection Time: 07/28/14  3:52 PM  Result Value Ref Range Status   MRSA by PCR NEGATIVE NEGATIVE Final    Comment:        The GeneXpert MRSA Assay (FDA approved for NASAL specimens only), is one component of a comprehensive MRSA colonization surveillance program. It is not intended to diagnose MRSA infection nor to guide or monitor treatment for MRSA infections.  Culture, expectorated sputum-assessment     Status: None   Collection Time: 07/28/14  4:21 PM  Result Value Ref Range Status   Specimen Description SPUTUM  Final   Special Requests Immunocompromised  Final   Sputum evaluation   Final    THIS SPECIMEN IS ACCEPTABLE. RESPIRATORY CULTURE REPORT TO FOLLOW.   Report Status 07/28/2014 FINAL  Final     Studies: Dg Chest 2 View  07/28/2014   CLINICAL DATA:  Worsening hypoxia and lethargy. Recent pneumonia. Breast carcinoma.  EXAM: CHEST  2 VIEW  COMPARISON:  07/23/2014  FINDINGS: Diffuse pulmonary interstitial infiltrates show no significant change. No evidence of superimposed consolidation or pleural effusion. Cardiomegaly remains stable.  IMPRESSION: No significant change in cardiomegaly and diffuse bilateral pulmonary interstitial infiltrates.   Electronically Signed   By: Earle Gell M.D.   On: 07/28/2014 13:04   Dg Chest Port 1 View  07/29/2014   CLINICAL DATA:  Hypoxia.  History of breast cancer.  EXAM: PORTABLE CHEST - 1 VIEW  COMPARISON:  07/28/2014.   FINDINGS: Mediastinum hilar structures normal. Mild cardiomegaly. Diffuse severe from interstitial infiltrates are present. Small right pleural effusion. These findings could be related to congestive heart for and interstitial edema severe pneumonitis could also present in this fashion. Interstitial tumor spread could present in this fashion. No acute bony abnormality identified.  IMPRESSION: 1. Severe bilateral pulmonary interstitial infiltrates. Small right pleural effusion. These findings may be related to congestive heart failure an interstitial edema. Severe interstitial pneumonitis could present in this fashion. Interstitial tumor spread could present in this fashion. 2. Cardiomegaly.   Electronically Signed   By: Marcello Moores  Register   On: 07/29/2014 08:25    Scheduled Meds: . antiseptic oral rinse  7 mL Mouth Rinse q12n4p  . aspirin  81 mg Oral Daily  . chlorhexidine  15 mL Mouth Rinse BID  . enoxaparin (LOVENOX) injection  40 mg Subcutaneous Q24H  . fluconazole  100 mg Oral Daily  . folic acid  1 mg Oral Daily  . furosemide  20 mg Intravenous Daily  . loratadine  10 mg Oral Daily  . metoprolol succinate  12.5 mg Oral Daily  . pantoprazole  40 mg Oral Daily  . polyvinyl alcohol  1 drop Both Eyes Daily  . potassium chloride SA  40 mEq Oral Daily  . predniSONE  5 mg Oral Q breakfast  . saccharomyces boulardii  250 mg Oral BID  . sodium chloride  3 mL Intravenous Q12H  . vancomycin  500 mg Intravenous Q12H   Continuous Infusions:   Principal Problem:   Sepsis Active Problems:   Hyponatremia   Immune disorder   Fever   Hypoxia   Diastolic HF (heart failure)    Time spent: 35 minutes.     Niel Hummer A  [facility] Pager [phone]. If 7PM-7AM, please contact night-coverage at [email or web address], password [facility] 07/29/2014, 9:23 AM  LOS: 1 day

## 2014-07-30 ENCOUNTER — Encounter (HOSPITAL_COMMUNITY)
Admission: EM | Disposition: A | Discharge status: Home Health | Payer: Self-pay | Source: Home / Self Care | Attending: Internal Medicine

## 2014-07-30 ENCOUNTER — Ambulatory Visit (HOSPITAL_COMMUNITY): Payer: Medicare Other

## 2014-07-30 DIAGNOSIS — J479 Bronchiectasis, uncomplicated: Secondary | ICD-10-CM

## 2014-07-30 DIAGNOSIS — R0602 Shortness of breath: Secondary | ICD-10-CM

## 2014-07-30 DIAGNOSIS — R918 Other nonspecific abnormal finding of lung field: Secondary | ICD-10-CM

## 2014-07-30 DIAGNOSIS — R197 Diarrhea, unspecified: Secondary | ICD-10-CM

## 2014-07-30 DIAGNOSIS — E440 Moderate protein-calorie malnutrition: Secondary | ICD-10-CM | POA: Insufficient documentation

## 2014-07-30 DIAGNOSIS — R05 Cough: Secondary | ICD-10-CM

## 2014-07-30 DIAGNOSIS — A419 Sepsis, unspecified organism: Principal | ICD-10-CM

## 2014-07-30 LAB — BASIC METABOLIC PANEL
ANION GAP: 13 (ref 5–15)
BUN: 13 mg/dL (ref 6–23)
CALCIUM: 8.5 mg/dL (ref 8.4–10.5)
CHLORIDE: 95 meq/L — AB (ref 96–112)
CO2: 25 meq/L (ref 19–32)
Creatinine, Ser: 0.66 mg/dL (ref 0.50–1.10)
GFR calc Af Amer: 89 mL/min — ABNORMAL LOW (ref >90)
GFR calc non Af Amer: 77 mL/min — ABNORMAL LOW (ref >90)
Glucose, Bld: 108 mg/dL — ABNORMAL HIGH (ref 70–99)
Potassium: 4.1 mEq/L (ref 3.7–5.3)
Sodium: 133 mEq/L — ABNORMAL LOW (ref 137–147)

## 2014-07-30 LAB — CBC
HEMATOCRIT: 37.3 % (ref 36.0–46.0)
HEMOGLOBIN: 12.6 g/dL (ref 12.0–15.0)
MCH: 29.2 pg (ref 26.0–34.0)
MCHC: 33.8 g/dL (ref 30.0–36.0)
MCV: 86.5 fL (ref 78.0–100.0)
PLATELETS: 212 10*3/uL (ref 150–400)
RBC: 4.31 MIL/uL (ref 3.87–5.11)
RDW: 18.2 % — ABNORMAL HIGH (ref 11.5–15.5)
WBC: 9.7 10*3/uL (ref 4.0–10.5)

## 2014-07-30 LAB — TROPONIN I: Troponin I: <0.3 ng/mL (ref <0.30)

## 2014-07-30 LAB — GLUCOSE, CAPILLARY: Glucose-Capillary: 114 mg/dL — ABNORMAL HIGH (ref 70–99)

## 2014-07-30 SURGERY — VIDEO BRONCHOSCOPY WITHOUT FLUORO
Anesthesia: Moderate Sedation

## 2014-07-30 MED ORDER — GI COCKTAIL ~~LOC~~
30.0000 mL | Freq: Once | ORAL | Status: AC
  Administered 2014-07-30: 30 mL via ORAL
  Filled 2014-07-30 (×3): qty 30

## 2014-07-30 MED ORDER — BUTAMBEN-TETRACAINE-BENZOCAINE 2-2-14 % EX AERO
1.0000 | INHALATION_SPRAY | Freq: Once | CUTANEOUS | Status: DC
  Filled 2014-07-30: qty 56

## 2014-07-30 MED ORDER — LIDOCAINE HCL 2 % EX GEL
1.0000 "application " | Freq: Once | CUTANEOUS | Status: DC
  Filled 2014-07-30: qty 5

## 2014-07-30 MED ORDER — PHENYLEPHRINE HCL 0.25 % NA SOLN
1.0000 | Freq: Four times a day (QID) | NASAL | Status: DC | PRN
  Filled 2014-07-30: qty 15

## 2014-07-30 MED ORDER — MAGNESIUM HYDROXIDE 400 MG/5ML PO SUSP
30.0000 mL | Freq: Once | ORAL | Status: AC
  Administered 2014-07-30: 30 mL via ORAL
  Filled 2014-07-30: qty 30

## 2014-07-30 NOTE — Progress Notes (Signed)
Daughters questioned why Troponin was drawn again after 3 hours. Martha Mcbride states she knew it was drawn every 6 hours  when she was at [facility]. Called Lab to cancel Troponin order for 1417. When RN released order it processed as needing to be drawn. Lab drew Troponin for second time. Troponin was drawn when first ordered this am. Dr Tyrell Antonio and Consuello Masse aware. Daughters concerned that there was a communication error as to why it was redrawn. Updated daughter [name] in room as above.

## 2014-07-30 NOTE — Plan of Care (Signed)
Problem: Phase I Progression Outcomes Goal: OOB as tolerated unless otherwise ordered Outcome: Completed/Met Date Met:  07/30/14 Pt is getting up to [facility] with little assist.  Goal: Hemodynamically stable Outcome: Completed/Met Date Met:  07/30/14

## 2014-07-30 NOTE — Progress Notes (Signed)
[facility]  Patient Status: Active (receiving services up to time of hospitalization)  [facility] is providing the following services: RN, PT and HHA  If patient discharges after hours, please call [phone].   Martha Mcbride 07/30/2014, 1:50 PM

## 2014-07-30 NOTE — Progress Notes (Signed)
Per daughters request to place on chart for all physicians to see: DO NOT ASK PATIENT ABOUT CODE STATUS AGAIN. SHE WANTS TO LIVE. THAT'S WHY SHE'S HERE.  DAUGHTER, [name], VERY DIRECT WITH REQUEST FOR RN TO PLACE ON CHART, POINTING TO GRAY CHART.

## 2014-07-30 NOTE — Progress Notes (Signed)
ANTIBIOTIC CONSULT NOTE   Pharmacy Consult for Vancomycin/cefepime Indication: pneumonia  Allergies  Allergen Reactions  . Doxycycline Nausea And Vomiting  . Other Nausea And Vomiting and Other (See Comments)    Anesthesia makes her very sleepy and makes it hard for her to come out of it.   Marland Kitchen Penicillins Hives and Nausea And Vomiting  . Sulfa Antibiotics Nausea And Vomiting    Patient Measurements: Height: 5\' 1"  (154.9 cm) Weight: 146 lb 2.6 oz (66.3 kg) IBW/kg (Calculated) : 47.8   Vital Signs: Temp: 100.7 F (38.2 C) (11/02 0800) Temp Source: Oral (11/02 0800) BP: 147/69 mmHg (11/02 0800) Pulse Rate: 95 (11/02 0800) Intake/Output from previous day: 11/01 0701 - 11/02 0700 In: 770 [P.O.:240; I.V.:230; IV Piggyback:300] Out: 2525 [Urine:2525] Intake/Output from this shift: Total I/O In: 20 [I.V.:20] Out: -   Labs:  Recent Labs  07/28/14 1225 07/29/14 0330 07/30/14 0805  WBC 12.5* 8.5 9.7  HGB 12.7 11.2* 12.6  PLT 232 208 212  CREATININE 0.70 0.55 0.66   Estimated Creatinine Clearance: 43.2 mL/min (by C-G formula based on Cr of 0.66). No results for input(s): VANCOTROUGH, VANCOPEAK, VANCORANDOM, GENTTROUGH, GENTPEAK, GENTRANDOM, TOBRATROUGH, TOBRAPEAK, TOBRARND, AMIKACINPEAK, AMIKACINTROU, AMIKACIN in the last 72 hours.   Microbiology: Recent Results (from the past 720 hour(s))  Urine culture     Status: None   Collection Time: 07/20/14  1:37 AM  Result Value Ref Range Status   Specimen Description URINE, RANDOM  Final   Special Requests NONE  Final   Culture  Setup Time   Final    07/20/2014 09:07 Performed at [place]   Final    3,000 COLONIES/ML Performed at [facility]   Culture   Final    INSIGNIFICANT GROWTH Performed at [facility]   Report Status 07/21/2014 FINAL  Final  Culture, expectorated sputum-assessment     Status: None   Collection Time: 07/20/14  2:09 AM  Result Value Ref Range Status    Specimen Description Expect. Sput  Final   Special Requests NONE  Final   Sputum evaluation   Final    THIS SPECIMEN IS ACCEPTABLE. RESPIRATORY CULTURE REPORT TO FOLLOW.   Report Status 07/20/2014 FINAL  Final  Culture, respiratory (NON-Expectorated)     Status: None   Collection Time: 07/20/14  2:09 AM  Result Value Ref Range Status   Specimen Description SPUTUM  Final   Special Requests NONE  Final   Gram Stain   Final    RARE WBC PRESENT, PREDOMINANTLY MONONUCLEAR NO SQUAMOUS EPITHELIAL CELLS SEEN RARE GRAM POSITIVE COCCI IN PAIRS IN CLUSTERS Performed at [facility]   Culture   Final    NORMAL OROPHARYNGEAL FLORA Performed at [facility]   Report Status 07/22/2014 FINAL  Final  Culture, blood (routine x 2)     Status: None   Collection Time: 07/20/14  2:10 AM  Result Value Ref Range Status   Specimen Description BLOOD RIGHT ANTECUBITAL  Final   Special Requests BOTTLES DRAWN AEROBIC ONLY 2ML  Final   Culture  Setup Time   Final    07/20/2014 08:58 Performed at [place]   Final    NO GROWTH 5 DAYS Performed at [facility]   Report Status 07/26/2014 FINAL  Final  Culture, blood (routine x 2)     Status: None   Collection Time: 07/20/14  2:15 AM  Result Value Ref Range Status  Specimen Description BLOOD L HAND  Final   Special Requests BOTTLES DRAWN AEROBIC ONLY 5 ML  Final   Culture  Setup Time   Final    07/20/2014 08:58 Performed at [facility]   Culture   Final    NO GROWTH 5 DAYS Performed at [facility]   Report Status 07/26/2014 FINAL  Final  Ova and parasite examination     Status: None   Collection Time: 07/20/14  5:08 AM  Result Value Ref Range Status   Specimen Description STOOL  Final   Special Requests NONE  Final   Ova and parasites   Final    NO OVA OR PARASITES SEEN Performed at [facility]   Report Status 07/23/2014 FINAL  Final  Blood culture (routine x 2)     Status:  None (Preliminary result)   Collection Time: 07/28/14 12:21 PM  Result Value Ref Range Status   Specimen Description BLOOD LEFT WRIST  4 ML IN [facility] BOTTLE  Final   Special Requests NONE  Final   Culture  Setup Time   Final    07/28/2014 20:11 Performed at [facility]    Culture   Final           BLOOD CULTURE RECEIVED NO GROWTH TO DATE CULTURE WILL BE HELD FOR 5 DAYS BEFORE ISSUING A FINAL NEGATIVE REPORT Performed at [facility]    Report Status PENDING  Incomplete  Blood culture (routine x 2)     Status: None (Preliminary result)   Collection Time: 07/28/14 12:49 PM  Result Value Ref Range Status   Specimen Description BLOOD BLOOD LEFT FOREARM  Final   Special Requests BOTTLES DRAWN AEROBIC AND ANAEROBIC 5ML  Final   Culture  Setup Time   Final    07/28/2014 20:10 Performed at [facility]    Culture   Final           BLOOD CULTURE RECEIVED NO GROWTH TO DATE CULTURE WILL BE HELD FOR 5 DAYS BEFORE ISSUING A FINAL NEGATIVE REPORT Performed at [facility]    Report Status PENDING  Incomplete  MRSA PCR Screening     Status: None   Collection Time: 07/28/14  3:52 PM  Result Value Ref Range Status   MRSA by PCR NEGATIVE NEGATIVE Final    Comment:        The GeneXpert MRSA Assay (FDA approved for NASAL specimens only), is one component of a comprehensive MRSA colonization surveillance program. It is not intended to diagnose MRSA infection nor to guide or monitor treatment for MRSA infections.  Culture, expectorated sputum-assessment     Status: None   Collection Time: 07/28/14  4:21 PM  Result Value Ref Range Status   Specimen Description SPUTUM  Final   Special Requests Immunocompromised  Final   Sputum evaluation   Final    THIS SPECIMEN IS ACCEPTABLE. RESPIRATORY CULTURE REPORT TO FOLLOW.   Report Status 07/28/2014 FINAL  Final  Culture, respiratory (NON-Expectorated)     Status: None (Preliminary result)   Collection Time: 07/28/14   4:21 PM  Result Value Ref Range Status   Specimen Description SPUTUM  Final   Special Requests NONE  Final   Gram Stain PENDING  Incomplete   Culture   Final    Culture reincubated for better growth Performed at [facility]    Report Status PENDING  Incomplete    Medical History: Past Medical History  Diagnosis Date  .  Arthritis   . Polymyalgia   . Acid reflux   . Immune deficiency disorder     autoimmune disorder - polymyalgia - on prednisone  . Breast cancer     Treated with lumpectomy and radiation  . Giardia   . Pinworms     Assessment: 41 yoF discharged from hospital several days ago (admitted 10/22-10/26) after being treated for HAP (vanco + aztreonam then transitioned to levofloxacin). Pt presents with worsening SOB, hypoxia, fever. CXR shows no significant change in cardiomegaly and diffuse bilateral pulmonary interstitial infiltrates. She was started on praziquantal (anti-helminthic) prior to 10/22 admission for polyparasitic infection, giardia, pinworm, roundworm (praziquantal given 10/22 [prior to [place], 10/24, and 10/26 dose was to be brought in by family [family provided 10/24 dose] d/t non-formulary status but dose never charted). ID consulted and rechecking ova and parasites (states praziquantal was not drug of choice for stated parasitic infections) Pharmacy consulted to dose Vancomycin for HCAP/sepsis.   10/31 >>Vanc >> 10/31 >>Cefepime >> 11/1 >> fluconazole (MD) >>  Tmax: 100.7 WBCs: improved to wnl Renal: SCr 0.66 CrCl 55 N Lactate: 3.5 > 2.4  10/31 blood: ngtd 10/31 urine: sent (UA neg) 10/31 sputum: reincubate 11/1 ova and parasites: pending 11/1 quantiferon gold: pending 11/1 strongyloides Ab: pending  Drug level / dose changes info: 11/3 0230 VT = ___mcg/ml on 500mg  IV q12h (prior to 6th dose)   Goal of Therapy:  Vancomycin trough level 15-20 mcg/ml Appropriate antibiotic dosing for renal function; eradication of  infection  Plan:   Continue Vancomycin 500mg  IV Q12H  Check vancomycin trough 11/3 am  Continue Cefepime 1gm IV q12h  Doreene Eland, PharmD, BCPS.   Pager: [phone] 07/30/2014 9:11 AM

## 2014-07-30 NOTE — Evaluation (Signed)
Clinical/Bedside Swallow Evaluation Patient Details  Name: Martha Mcbride MRN: [id number] Date of Birth: 08-06-1925  Today's Date: 07/30/2014 Time: 2671-2458 SLP Time Calculation (min): 40 min  Past Medical History:  Past Medical History  Diagnosis Date  . Arthritis   . Polymyalgia   . Acid reflux   . Immune deficiency disorder     autoimmune disorder - polymyalgia - on prednisone  . Breast cancer     Treated with lumpectomy and radiation  . Giardia   . Pinworms    Past Surgical History:  Past Surgical History  Procedure Laterality Date  . Breast lumpectomy    . Partial hysterectomy    . Tonsillectomy    . Tee without cardioversion N/A 07/20/2014    Procedure: TRANSESOPHAGEAL ECHOCARDIOGRAM (TEE);  Surgeon: Candee Furbish, MD;  Location: [facility] ENDOSCOPY;  Service: Cardiovascular;  Laterality: N/A;   HPI:  80 y/o female with polymyalgia on chronic prednisone (5mg ) and leflunomide admitted on 10/31 to [facility] with acute hypoxemic respiratory failure and diffuse bilateral infiltrates per MD note.  Pt also with recent Eosinophilia, CHF, acid reflux, parasitic diagnosis - giardia, pinworm, roundworms.  Order for swallow evaluation received and daughter [name] questioned if pt may dysphagia.  Pt denies dysphagia but admits to "memory problem" and occasional choking.  She does take her pills with applesauce premorbidly - daughter [name] via speaker phone stated this was only due to pt consuming a full cup of water with pills and complaining of being full - therefore [name] recommended pt take pills with applesauce.  CT Chest 11/2 showed "Evidence of emphysema with traction bronchiectasis in multiple areas of interstitial fibrosis. This appearance is consistent with underlying usual interstitial pneumonitis. There are areas of patchy consolidation in both lower lobes as well as to a lesser extent in the upper lobes. There also moderate pleural effusions. The areas of consolidation are felt to most likely  represent superimposed pneumonia. There may be a degree of superimposed congestive heart failure as well given the pleural effusions. 5 mm nodular opacity in the posterior segment of the right upper lobe.  Areas of atherosclerotic change and cardiac valvular calcification."   Assessment / Plan / Recommendation Clinical Impression  Pt presenting with symptoms concerning for possible multi-factorial dysphagia.    CN exam largely unremarkable except mild left facial asymmetry and decreased movement and minimal palatal deviation to left upon phonation.  Pt with intact phonation and strong productive cough.  Cough and secretion expectoration noted consistently after approx 4-5 boluses of thin/applesauce.  Baseline cough not observed however [name], daughter, reports pt was coughing without intake earlier today.    Given subtle CN deficits and consistent cough after intake, SLP can not rule out overt pharyngeal dysphagia.    Note pt recent diagnosis of eosinophilia and parasitic infection - ? if her symptoms of rapid satiety and nausea could be consistent with possible eosinophilic esophagitis.   Also note pt had radiation for her breast cancer, ? If this may contribute to possible esophageal dysphagia.   Recommend to modify diet to clear liquids (in very small amounts/tsps) except medicine with applesauce - with very strict precautions and consider MBS to rule out overt pharyngeal dysphagia.  If warranted, MD may desire to consider esophageal/GI eval.  Spoke to daughter [name] and pt in room as well as [name] via speaker phone.    Reviewed recommendations and importance of strict aspiration precautions.  SLP to follow up next date - hopefully for MBS dependent on if bronch  scheduled.      Aspiration Risk  Moderate    Diet Recommendation Thin liquid (clears in SMALL amounts and medicine with applesauce)   Liquid Administration via: Cup;Spoon;No straw Medication Administration: Whole meds with  puree Supervision: Patient able to self feed Compensations: Slow rate;Small sips/bites (rest if dyspneic ) Postural Changes and/or Swallow Maneuvers: Seated upright 90 degrees;Upright 30-60 min after meal    Other  Recommendations Recommended Consults: MBS;Consider GI evaluation;Consider esophageal assessment Oral Care Recommendations: Oral care BID   Follow Up Recommendations       Frequency and Duration min 1 x/week  1 week   Pertinent Vitals/Pain Fever spikes ongoing, desaturation at night requiring oxygen     Swallow Study Prior Functional Status   recent admit with pna, poor intake since    General Date of Onset: 07/30/14 HPI: 80 y/o female with polymyalgia on chronic prednisone (5mg ) and leflunomide admitted on 10/31 to [facility] with acute hypoxemic respiratory failure and diffuse bilateral infiltrates per MD note.  Pt also with recent Eosinophilia, CHF, acid reflux, parasitic diagnosis - giardia, pinworm, roundworms.  Order for swallow evaluation received and daughter [name] questioned if pt may dysphagia.  Pt denies dysphagia but admits to "memory problem" and occasional choking.  She does take her pills with applesauce premorbidly - daughter [name] via speaker phone stated this was only due to pt consuming a full cup of water with pills and complaining of being full - therefore [name] recommended pt take pills with applesauce.  CT Chest 11/2 showed "Evidence of emphysema with traction bronchiectasis in multiple areas of interstitial fibrosis. This appearance is consistent with underlying usual interstitial pneumonitis. There are areas of patchy consolidation in both lower lobes as well as to a lesser extent in the upper lobes. There also moderate pleural effusions. The areas of consolidation are felt to most likely represent superimposed pneumonia. There may be a degree of superimposed congestive heart failure as well given the pleural effusions. 5 mm nodular opacity in the posterior segment of  the right upper lobe.  Areas of atherosclerotic change and cardiac valvular calcification." Type of Study: Bedside swallow evaluation Diet Prior to this Study: Regular Temperature Spikes Noted: Yes Respiratory Status: Room air History of Recent Intubation: No Behavior/Cognition: Alert;Cooperative;Pleasant mood Oral Cavity - Dentition: Dentures, top (lower partial) Self-Feeding Abilities: Able to feed self;Needs set up Patient Positioning: Upright in bed Baseline Vocal Quality: Clear [name] reports voice will change intermittently  - likely due to excessive coughing) Volitional Cough: Strong (productive) Volitional Swallow: Able to elicit    Oral/Motor/Sensory Function Overall Oral Motor/Sensory Function: Impaired Labial ROM: Within Functional Limits Labial Symmetry: Abnormal symmetry left (slight) Labial Strength: Within Functional Limits Labial Sensation: Within Functional Limits Lingual ROM:  (pt appeared mildy dysarthric, tongue midline) Lingual Strength:  (speech appears mildly dysarthric but daughter states is baseline) Lingual Sensation:  (dnt) Facial ROM: Reduced right Facial Symmetry:  (mild left facial asymmetry) Facial Sensation: Within Functional Limits Velum: Impaired right (velum slightly deviates to left upon phonation)   Ice Chips Ice chips: Not tested   Thin Liquid Thin Liquid: Impaired Presentation: Spoon;Cup Pharyngeal  Phase Impairments: Cough - Delayed Other Comments: audible swallow, delayed productive cough with expectoration of secretions    Nectar Thick Nectar Thick Liquid: Not tested   Honey Thick Honey Thick Liquid: Not tested   Puree Puree: Impaired Presentation: Self Fed;Spoon Pharyngeal Phase Impairments: Cough - Delayed Other Comments: audible swallow, delayed productive cough noted after approximately 2 ounces of applesauce -  pt did not expectorate applesauce but secretions   Solid   GO    Solid: Not tested       Luanna Salk, [place] [facility]  SLP [phone]

## 2014-07-30 NOTE — Progress Notes (Signed)
[place] for Infectious Disease  Date of Admission:  07/28/2014  Antibiotics: Vancomycin cefepime  Subjective: Some sob, cough  Objective: Temp:  [98 F (36.7 C)-100.7 F (38.2 C)] 98.9 F (37.2 C) (11/02 1200) Pulse Rate:  [85-102] 99 (11/02 1200) Resp:  [12-30] 27 (11/02 1200) BP: (113-157)/(46-84) 136/67 mmHg (11/02 1200) SpO2:  [87 %-100 %] 98 % (11/02 1200)  General: awake, nad Skin: no rashes Lungs: some rhonchi Cor: RRR Abdomen: soft, nt Ext: no edema  Lab Results Lab Results  Component Value Date   WBC 9.7 07/30/2014   HGB 12.6 07/30/2014   HCT 37.3 07/30/2014   MCV 86.5 07/30/2014   PLT 212 07/30/2014    Lab Results  Component Value Date   CREATININE 0.66 07/30/2014   BUN 13 07/30/2014   NA 133* 07/30/2014   K 4.1 07/30/2014   CL 95* 07/30/2014   CO2 25 07/30/2014    Lab Results  Component Value Date   ALT 24 07/28/2014   AST 18 07/28/2014   ALKPHOS 119* 07/28/2014   BILITOT 0.8 07/28/2014      Microbiology: Recent Results (from the past 240 hour(s))  Blood culture (routine x 2)     Status: None (Preliminary result)   Collection Time: 07/28/14 12:21 PM  Result Value Ref Range Status   Specimen Description BLOOD LEFT WRIST  4 ML IN [facility] BOTTLE  Final   Special Requests NONE  Final   Culture  Setup Time   Final    07/28/2014 20:11 Performed at [facility]    Culture   Final           BLOOD CULTURE RECEIVED NO GROWTH TO DATE CULTURE WILL BE HELD FOR 5 DAYS BEFORE ISSUING A FINAL NEGATIVE REPORT Performed at [facility]    Report Status PENDING  Incomplete  Blood culture (routine x 2)     Status: None (Preliminary result)   Collection Time: 07/28/14 12:49 PM  Result Value Ref Range Status   Specimen Description BLOOD BLOOD LEFT FOREARM  Final   Special Requests BOTTLES DRAWN AEROBIC AND ANAEROBIC 5ML  Final   Culture  Setup Time   Final    07/28/2014 20:10 Performed at [facility]    Culture    Final           BLOOD CULTURE RECEIVED NO GROWTH TO DATE CULTURE WILL BE HELD FOR 5 DAYS BEFORE ISSUING A FINAL NEGATIVE REPORT Performed at [facility]    Report Status PENDING  Incomplete  Urine culture     Status: None (Preliminary result)   Collection Time: 07/28/14  2:03 PM  Result Value Ref Range Status   Specimen Description URINE, CLEAN CATCH  Final   Special Requests NONE  Final   Culture  Setup Time   Final    07/29/2014 02:28 Performed at [facility]   Final    30,000 COLONIES/ML Performed at [facility]    Culture   Final    STAPHYLOCOCCUS SPECIES (COAGULASE NEGATIVE) Note: RIFAMPIN AND GENTAMICIN SHOULD NOT BE USED AS SINGLE DRUGS FOR TREATMENT OF STAPH INFECTIONS. Performed at [facility]    Report Status PENDING  Incomplete  MRSA PCR Screening     Status: None   Collection Time: 07/28/14  3:52 PM  Result Value Ref Range Status   MRSA by PCR NEGATIVE NEGATIVE Final    Comment:  The GeneXpert MRSA Assay (FDA approved for NASAL specimens only), is one component of a comprehensive MRSA colonization surveillance program. It is not intended to diagnose MRSA infection nor to guide or monitor treatment for MRSA infections.  Culture, expectorated sputum-assessment     Status: None   Collection Time: 07/28/14  4:21 PM  Result Value Ref Range Status   Specimen Description SPUTUM  Final   Special Requests Immunocompromised  Final   Sputum evaluation   Final    THIS SPECIMEN IS ACCEPTABLE. RESPIRATORY CULTURE REPORT TO FOLLOW.   Report Status 07/28/2014 FINAL  Final  Culture, respiratory (NON-Expectorated)     Status: None (Preliminary result)   Collection Time: 07/28/14  4:21 PM  Result Value Ref Range Status   Specimen Description SPUTUM  Final   Special Requests NONE  Final   Gram Stain PENDING  Incomplete   Culture   Final    NORMAL OROPHARYNGEAL FLORA Performed at [facility]    Report  Status PENDING  Incomplete    Studies/Results: Ct Chest Wo Contrast  07/30/2014   CLINICAL DATA:  Fever and elevated white blood cell count. Chronic shortness of breath and chest pain. History of breast carcinoma in 2000  EXAM: CT CHEST WITHOUT CONTRAST  TECHNIQUE: Multidetector CT imaging of the chest was performed following the standard protocol without IV contrast material administration.  COMPARISON:  Chest radiograph August 08, 2014  FINDINGS: There is underlying emphysematous change with interstitial fibrosis and honeycombing throughout both upper lobes, primarily medially as well as in the lingula, right middle lobe, and both lower lobes. There is diffuse traction type bronchiectasis.  There are moderate free-flowing effusions bilaterally. There are areas of consolidation in both lower lobes in a somewhat patchy distribution. Lesser degrees of airspace consolidation are noted in both upper lobes.  On axial slice 32 series 5, there is a 5 mm nodular opacity in the posterior segment of the right upper lobe.  There is atherosclerotic change in the aorta but no aneurysm. No pulmonary embolus is seen on this noncontrast enhanced study. There is extensive calcification in the mitral valve and aortic valve regions.  There are multiple small lymph nodes throughout the mediastinum. There is a lymph node in the anterior precarinal region measuring 1.6 by 1.4 cm. A second lymph node in this area measures 2.0 x 1.2 cm. A lymph node in the aortopulmonary window region measures 1.5 x 1.3 cm.  There is a moderate hiatal hernia.  In the visualized upper abdominal region, there is atherosclerotic change in the aorta. There is cholelithiasis. There is a small cyst in the posterior segment of the right lobe of the liver.  There is degenerative change in the thoracic spine with increase in kyphosis. There are no blastic or lytic bone lesions. Thyroid appears unremarkable.  IMPRESSION: Evidence of emphysema with traction  bronchiectasis in multiple areas of interstitial fibrosis. This appearance is consistent with underlying usual interstitial pneumonitis.  There are areas of patchy consolidation in both lower lobes as well as to a lesser extent in the upper lobes. There also moderate pleural effusions. The areas of consolidation are felt to most likely represent superimposed pneumonia. There may be a degree of superimposed congestive heart failure as well given the pleural effusions.  5 mm nodular opacity in the posterior segment of the right upper lobe. Followup of this nodular opacity should be based on Fleischner Society guidelines. If the patient is at high risk for bronchogenic carcinoma, follow-up chest CT  at 6-12 months is recommended. If the patient is at low risk for bronchogenic carcinoma, follow-up chest CT at 12 months is recommended. This recommendation follows the consensus statement: Guidelines for Management of Small Pulmonary Nodules Detected on CT Scans: A Statement from the [place] as published in Radiology 2005;237:395-400.  Areas of atherosclerotic change and cardiac valvular calcification.  Areas of relatively mild adenopathy.  Cholelithiasis.  Given the areas of questionable pneumonia, it may be prudent to consider a followup study in 2-3 weeks to assess for clearing.   Electronically Signed   By: Lowella Grip M.D.   On: 07/30/2014 11:39   Dg Chest Port 1 View  07/29/2014   CLINICAL DATA:  Hypoxia.  History of breast cancer.  EXAM: PORTABLE CHEST - 1 VIEW  COMPARISON:  07/28/2014.  FINDINGS: Mediastinum hilar structures normal. Mild cardiomegaly. Diffuse severe from interstitial infiltrates are present. Small right pleural effusion. These findings could be related to congestive heart for and interstitial edema severe pneumonitis could also present in this fashion. Interstitial tumor spread could present in this fashion. No acute bony abnormality identified.  IMPRESSION: 1. Severe bilateral  pulmonary interstitial infiltrates. Small right pleural effusion. These findings may be related to congestive heart failure an interstitial edema. Severe interstitial pneumonitis could present in this fashion. Interstitial tumor spread could present in this fashion. 2. Cardiomegaly.   Electronically Signed   By: Marcello Moores  Register   On: 07/29/2014 08:25    Assessment/Plan: 1)  Diarrhea - no results yet from outside clinic but it would be very unlikely to have ascariasis, giardia and enterobiasis in someone without recent travel to a developing country even with some immunosuppression.  Will review results from [facility] when available but has resolved regardless.  2)  Pulmonary bronchiectasis - noted on CT.  To get bronchoscopy. Possible usual interestitial pneumonitis.  Interstitial fibrosis.  Does not appear c/w HCAP and will d/c vancomycin.  Will defer work up and discussion to pulmonary.  I will consider stopping antibiotics completely if felt this is the likely cause (UIP).    Scharlene Gloss, [place] for Infectious Disease [place] [email or web address] [phone] pager   [phone] cell 07/30/2014, 1:52 PM

## 2014-07-30 NOTE — Progress Notes (Signed)
INITIAL NUTRITION ASSESSMENT  DOCUMENTATION CODES Per approved criteria  -Non-severe (moderate) malnutrition in the context of chronic illness  Pt meets criteria for moderate MALNUTRITION in the context of chronic illness as evidenced by PO intake <75% for one month, moderate muscle wasting and subcutaneous fat loss in upper arm and clavicle region.   INTERVENTION: -Recommend to liberalize diet to Regular to encourage PO as diet advancement tolerated -Recommend Boost Plus BID  -Consider chopped vegetable diet texture modification to assist in tolerance -RD to continue to monitor  NUTRITION DIAGNOSIS: Inadequate oral intake related to decreased appetite/taste changes/nausea as evidenced by PO intake < 75%.   Goal: Pt to meet >/= 90% of their estimated nutrition needs    Monitor:  Diet order, total protein/energy intake, labs, weights, swallow profile  Reason for Assessment: Consult to Assess  80 y.o. female  Admitting Dx: Sepsis  ASSESSMENT: Martha Mcbride is a 80 y.o. female with PMH significant for Polymyalgia on prednisone, severely calcified mitral valve with moderate stenosis, chronic diastolic heart failure,Gangrenous toes on the right foot , last admission 10-27 was treated for health care associated PNA, encephalopathy, and diastolic HF exacerbation.  She was brought to the emergency department today for worsening shortness of breath today and hypoxia as noted by a home pulse oximeter that the daughter has  -Pt's daughter reported pt with decreased appetite since previous hospitalization in 05/2014. PO intake had significant decreased iin past 3 weeks. -Diet recall indicated pt consuming 50% Boost supplements and 5 bites of food for entire day. Daughter had tried increasing caloric density of Boost by adding creamers. Family estimated pt consuming < 500 kcal/day -Pt endorsed feelings of nausea, early satiety and taste changes that inhibited PO intake; would sometimes  experience emesis episodes after taking medications -Is eating better during admit, was able to eat >50% of meals yesterday. Denied nausea/abd pain post meals. Family assisted in encouraging meals and chopping up foods pt unable to chew, particularly vegetables. May benefit from adding diet texture modifications as tolerated -SLP consulted on 11/01; noted that RN denied signs of aspirations and SLP recommended to implement nectar thick liquids as warranted -Weight trends indicate pt gained 8 lbs since 05/2014; however pt with nonpitting RLE and LLE which is likely impacting weight -Currently NPO for bronch;  Height: Ht Readings from Last 1 Encounters:  07/28/14 5\' 1"  (1.549 m)    Weight: Wt Readings from Last 1 Encounters:  07/29/14 146 lb 2.6 oz (66.3 kg)    Ideal Body Weight: 105 lb  % Ideal Body Weight: 139%  Wt Readings from Last 10 Encounters:  07/29/14 146 lb 2.6 oz (66.3 kg)  07/24/14 142 lb 3.2 oz (64.501 kg)  06/04/14 138 lb 14.2 oz (63 kg)    Usual Body Weight: approximately 140 lbs per records  % Usual Body Weight: 104%  BMI:  Body mass index is 27.63 kg/(m^2).  Estimated Nutritional Needs: Kcal: 1800-2000 Protein: 80-95 gram Fluid: >/= 1800 ml daily  Skin: stage 2 PU on buttock, gangreneous toes on right foot  Diet Order: Diet NPO time specified  EDUCATION NEEDS: -No education needs identified at this time   Intake/Output Summary (Last 24 hours) at 07/30/14 1027 Last data filed at 07/30/14 0800  Gross per 24 hour  Intake    520 ml  Output   2375 ml  Net  -1855 ml    Last BM: 11/01   Labs:   Recent Labs Lab 07/28/14 1225 07/29/14 0330 07/30/14 0805  NA 130* 131* 133*  K 4.0 3.3* 4.1  CL 90* 94* 95*  CO2 21 25 25   BUN 17 12 13   CREATININE 0.70 0.55 0.66  CALCIUM 8.5 8.2* 8.5  GLUCOSE 206* 162* 108*    CBG (last 3)   Recent Labs  07/30/14 0520  GLUCAP 114*    Scheduled Meds: . antiseptic oral rinse  7 mL Mouth Rinse q12n4p  .  aspirin  81 mg Oral Daily  . butamben-tetracaine-benzocaine  1 spray Topical Once  . ceFEPime (MAXIPIME) IV  1 g Intravenous Q12H  . chlorhexidine  15 mL Mouth Rinse BID  . enoxaparin (LOVENOX) injection  40 mg Subcutaneous Q24H  . fluconazole  100 mg Oral Daily  . folic acid  1 mg Oral Daily  . furosemide  20 mg Intravenous Daily  . lidocaine  1 application Topical Once  . loratadine  10 mg Oral Daily  . metoprolol succinate  12.5 mg Oral Daily  . pantoprazole  40 mg Oral Daily  . polyvinyl alcohol  1 drop Both Eyes Daily  . potassium chloride SA  40 mEq Oral Daily  . predniSONE  5 mg Oral Q breakfast  . saccharomyces boulardii  250 mg Oral BID  . sodium chloride  3 mL Intravenous Q12H  . vancomycin  500 mg Intravenous Q12H    Continuous Infusions:   Past Medical History  Diagnosis Date  . Arthritis   . Polymyalgia   . Acid reflux   . Immune deficiency disorder     autoimmune disorder - polymyalgia - on prednisone  . Breast cancer     Treated with lumpectomy and radiation  . Giardia   . Pinworms     Past Surgical History  Procedure Laterality Date  . Breast lumpectomy    . Partial hysterectomy    . Tonsillectomy    . Tee without cardioversion N/A 07/20/2014    Procedure: TRANSESOPHAGEAL ECHOCARDIOGRAM (TEE);  Surgeon: Candee Furbish, MD;  Location: [facility];  Service: Cardiovascular;  Laterality: N/A;    Atlee Abide MS RD LDN Clinical Dietitian [phone]

## 2014-07-30 NOTE — Progress Notes (Addendum)
Placed on VM last pm for sats dropping to 80's on RA with sleep. Pt snores and is a mouth breather so [place] did not sustain sats > 90%. Changed to RA during day hours. Dr Tyrell Antonio aware.

## 2014-07-30 NOTE — Consult Note (Signed)
WOC wound consult note Reason for Consult: Stage II pressure ulcer to sacrum/coccyx, present on admission.  Dry gangrene to right 2nd and 3rd toes, chronic, per daughter Wound type: Pressure ulcer, present on admission. Stage II Pressure Ulcer POA: Yes Measurement: 2 cm x 1.8 cm x 0.1 cm sacrum Tips of right 2nd and 3rd toes black, nail beds dry and intact, no drainage.  Wound bed: 100% pink, moist to sacrum Drainage (amount, consistency, odor) minimal, serous drainage.  No drainage to right toes Periwound: Intact Dressing procedure/placement/frequency: Cleanse sacrum with NS and pat gently dry.  Apply silicone bordered foam dressing to sacrum.  Change every 3 days and PRN soilage.   Cleanse right toes with soap and water and pat gently dry.  Paint with betadine and place 2x2 gauze between toes.  Cover with dry dressing.  Change daily.  Will not follow at this time.  Please re-consult if needed.  Domenic Moras RN BSN [place] Pager [phone]

## 2014-07-30 NOTE — Progress Notes (Signed)
Release of medical records faxed to the following:  [facility] Fax: [phone] Dr. Leigh Aurora Fax: [phone] Dr. Gaynelle Arabian Fax: [phone]

## 2014-07-30 NOTE — Progress Notes (Signed)
[facility] PROGRESS NOTE  Martha Mcbride AJO:[id number] DOB: 03-Oct-1924 DOA: 07/28/2014 PCP: Simona Huh, MD  Assessment/Plan: 1-Sepsis: Patient present with fever, BP in the 100 to 90 range, Lactic acid at 3.5. Chest x ray with persistent infiltrates. WBC at 12.  -Will continue with Vancomycin and cefepime for Health Care Associated PNA.  -Blood culture pending.  -UA with trace leukocytes.  urine culture pending.  -Lactic acid decrease to 2.4---from 3.5.   2-Acute Hypoxic Respiratory Failure. Pneumonitis ?  Chest x ray 11-01 showed worsening infiltrates: pulmonary edema, Pneumonitis.  CCM consulted for further evaluation of pneumonitis. Plan for bronchoscopy today.  Continue with Vancomycin and cefepime day 2.  Continue with treatment of PNA.  Patient will benefit form home oxygen, specially at night.  Sputum culture pending.   3-History of giardia, Ascaris Lumbricoids: didn't finish treatment. 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 \ ID consulted for treatment recommendation and further evaluation. Of note patient had stool culture negative for parasites last admission.  -Ova and parasite stool pending.  -strongyloids antibody pending.  -Quantiferon Tb pending.   -Chronic Diastolic HF;   BNP at 947, last admission at 1467. Patient appears compensated.  Chest x ray with worsening infiltrates, edema, pneumonitis.  Continue with lasix to 20 Mg IV daily.   4-Polymyalgia Rheumatica:  On prednisone 5 mg daily. Received one time dose stress dose steroid.  leflunomide was discontinue during last admission.   5-Gangrenous toes on the right foot  -Unclear etiology at this time, pulses on the right foot palpable with good perfusion  -Follow up with vascular outpatient.  -stable.   6-Decubitus ulcer  Wound care consult.   7-Malnutrition; appetite improving. Ensure. Nutrition consulted.   Code Status: Full Code.  Family Communication: Care discussed with  Daughter.  Disposition Plan: Remain in the step down unit.    Consultants:  Pulmonary  ID.   Procedures:  none  Antibiotics:  Vancomycin   Cefepime   HPI/Subjective: Patient feeling better, no chest pain, no dyspnea. No diarrhea.   Objective: Filed Vitals:   07/30/14 0600  BP: 120/84  Pulse: 95  Temp:   Resp: 24    Intake/Output Summary (Last 24 hours) at 07/30/14 0745 Last data filed at 07/30/14 0630  Gross per 24 hour  Intake    770 ml  Output   2525 ml  Net  -1755 ml   Filed Weights   07/28/14 2000 07/29/14 0200  Weight: 64.7 kg (142 lb 10.2 oz) 66.3 kg (146 lb 2.6 oz)    Exam:   General:  No distress.   Cardiovascular: S 1, S 2 RRR  Respiratory:Bilateral crackles.   Abdomen: BS present, soft. Nt  Musculoskeletal: no edema.   Data Reviewed: Basic Metabolic Panel:  Recent Labs Lab 07/24/14 0346 07/28/14 1225 07/29/14 0330  NA 134* 130* 131*  K 3.4* 4.0 3.3*  CL 96 90* 94*  CO2 24 21 25   GLUCOSE 127* 206* 162*  BUN 12 17 12   CREATININE 0.57 0.70 0.55  CALCIUM 8.6 8.5 8.2*   Liver Function Tests:  Recent Labs Lab 07/28/14 1225  AST 18  ALT 24  ALKPHOS 119*  BILITOT 0.8  PROT 6.5  ALBUMIN 2.8*   No results for input(s): LIPASE, AMYLASE in the last 168 hours. No results for input(s): AMMONIA in the last 168 hours. CBC:  Recent Labs Lab 07/24/14 0346 07/28/14 1225 07/29/14 0330  WBC 9.3 12.5* 8.5  NEUTROABS  --  8.4*  --  HGB 12.0 12.7 11.2*  HCT 35.9* 37.7 32.4*  MCV 87.1 87.5 85.9  PLT 180 232 208   Cardiac Enzymes:  Recent Labs Lab 07/28/14 1225  TROPONINI <0.30   BNP (last 3 results)  Recent Labs  07/19/14 2039 07/28/14 1225 07/29/14 0330  PROBNP 1467.0* 576.5* 753.4*   CBG:  Recent Labs Lab 07/30/14 0520  GLUCAP 114*    Recent Results (from the past 240 hour(s))  Blood culture (routine x 2)     Status: None (Preliminary result)   Collection Time: 07/28/14 12:21 PM  Result Value Ref  Range Status   Specimen Description BLOOD LEFT WRIST  4 ML IN [facility] BOTTLE  Final   Special Requests NONE  Final   Culture  Setup Time   Final    07/28/2014 20:11 Performed at [facility]    Culture   Final           BLOOD CULTURE RECEIVED NO GROWTH TO DATE CULTURE WILL BE HELD FOR 5 DAYS BEFORE ISSUING A FINAL NEGATIVE REPORT Performed at [facility]    Report Status PENDING  Incomplete  Blood culture (routine x 2)     Status: None (Preliminary result)   Collection Time: 07/28/14 12:49 PM  Result Value Ref Range Status   Specimen Description BLOOD BLOOD LEFT FOREARM  Final   Special Requests BOTTLES DRAWN AEROBIC AND ANAEROBIC 5ML  Final   Culture  Setup Time   Final    07/28/2014 20:10 Performed at [facility]    Culture   Final           BLOOD CULTURE RECEIVED NO GROWTH TO DATE CULTURE WILL BE HELD FOR 5 DAYS BEFORE ISSUING A FINAL NEGATIVE REPORT Performed at [facility]    Report Status PENDING  Incomplete  MRSA PCR Screening     Status: None   Collection Time: 07/28/14  3:52 PM  Result Value Ref Range Status   MRSA by PCR NEGATIVE NEGATIVE Final    Comment:        The GeneXpert MRSA Assay (FDA approved for NASAL specimens only), is one component of a comprehensive MRSA colonization surveillance program. It is not intended to diagnose MRSA infection nor to guide or monitor treatment for MRSA infections.  Culture, expectorated sputum-assessment     Status: None   Collection Time: 07/28/14  4:21 PM  Result Value Ref Range Status   Specimen Description SPUTUM  Final   Special Requests Immunocompromised  Final   Sputum evaluation   Final    THIS SPECIMEN IS ACCEPTABLE. RESPIRATORY CULTURE REPORT TO FOLLOW.   Report Status 07/28/2014 FINAL  Final  Culture, respiratory (NON-Expectorated)     Status: None (Preliminary result)   Collection Time: 07/28/14  4:21 PM  Result Value Ref Range Status   Specimen Description SPUTUM  Final    Special Requests NONE  Final   Gram Stain PENDING  Incomplete   Culture   Final    Culture reincubated for better growth Performed at [facility]    Report Status PENDING  Incomplete     Studies: Dg Chest 2 View  07/28/2014   CLINICAL DATA:  Worsening hypoxia and lethargy. Recent pneumonia. Breast carcinoma.  EXAM: CHEST  2 VIEW  COMPARISON:  07/23/2014  FINDINGS: Diffuse pulmonary interstitial infiltrates show no significant change. No evidence of superimposed consolidation or pleural effusion. Cardiomegaly remains stable.  IMPRESSION: No significant change in cardiomegaly and diffuse bilateral pulmonary interstitial infiltrates.  Electronically Signed   By: Earle Gell M.D.   On: 07/28/2014 13:04   Dg Chest Port 1 View  07/29/2014   CLINICAL DATA:  Hypoxia.  History of breast cancer.  EXAM: PORTABLE CHEST - 1 VIEW  COMPARISON:  07/28/2014.  FINDINGS: Mediastinum hilar structures normal. Mild cardiomegaly. Diffuse severe from interstitial infiltrates are present. Small right pleural effusion. These findings could be related to congestive heart for and interstitial edema severe pneumonitis could also present in this fashion. Interstitial tumor spread could present in this fashion. No acute bony abnormality identified.  IMPRESSION: 1. Severe bilateral pulmonary interstitial infiltrates. Small right pleural effusion. These findings may be related to congestive heart failure an interstitial edema. Severe interstitial pneumonitis could present in this fashion. Interstitial tumor spread could present in this fashion. 2. Cardiomegaly.   Electronically Signed   By: Marcello Moores  Register   On: 07/29/2014 08:25    Scheduled Meds: . antiseptic oral rinse  7 mL Mouth Rinse q12n4p  . aspirin  81 mg Oral Daily  . butamben-tetracaine-benzocaine  1 spray Topical Once  . ceFEPime (MAXIPIME) IV  1 g Intravenous Q12H  . chlorhexidine  15 mL Mouth Rinse BID  . enoxaparin (LOVENOX) injection  40 mg  Subcutaneous Q24H  . fluconazole  100 mg Oral Daily  . folic acid  1 mg Oral Daily  . furosemide  20 mg Intravenous Daily  . lidocaine  1 application Topical Once  . loratadine  10 mg Oral Daily  . metoprolol succinate  12.5 mg Oral Daily  . pantoprazole  40 mg Oral Daily  . polyvinyl alcohol  1 drop Both Eyes Daily  . potassium chloride SA  40 mEq Oral Daily  . predniSONE  5 mg Oral Q breakfast  . saccharomyces boulardii  250 mg Oral BID  . sodium chloride  3 mL Intravenous Q12H  . vancomycin  500 mg Intravenous Q12H   Continuous Infusions:   Principal Problem:   Sepsis Active Problems:   Hyponatremia   Immune disorder   Fever   Diastolic HF (heart failure)   Chronic diastolic heart failure   Acute respiratory failure with hypoxemia   Dyspnea   Pyrexia   Hypoxemia    Time spent: 35 minutes.     Niel Hummer A  [facility] Pager [phone]. If 7PM-7AM, please contact night-coverage at [email or web address], password [facility] 07/30/2014, 7:45 AM  LOS: 2 days

## 2014-07-30 NOTE — Consult Note (Signed)
PULMONARY / CRITICAL CARE MEDICINE   Name: Martha Mcbride MRN: [id number] DOB: [birth year]    ADMISSION DATE:  07/28/2014 CONSULTATION DATE:  07/28/2014   REFERRING MD :  Tyrell Antonio  CHIEF COMPLAINT:  Shortness of breath  INITIAL PRESENTATION: 80 y/o female with polymyalgia on chronic prednisone (5mg ) and leflunomide admitted on 10/31 to [facility] with acute hypoxemic respiratory failure and diffuse bilateral infiltrates.  STUDIES:    SIGNIFICANT EVENTS: 11/1  PCCM consulted for hypoxemic respiratory fx & bilateral diffuse infiltrates   SUBJECTIVE:  Pt reports chest pain, has been NPO overnight.  Family very anxious.   VITAL SIGNS: Temp:  [98 F (36.7 C)-100.7 F (38.2 C)] 100.7 F (38.2 C) (11/02 0800) Pulse Rate:  [85-99] 96 (11/02 0810) Resp:  [12-30] 20 (11/02 0810) BP: (113-147)/(46-84) 147/69 mmHg (11/02 0800) SpO2:  [87 %-100 %] 97 % (11/02 0810)  INTAKE / OUTPUT:  Intake/Output Summary (Last 24 hours) at 07/30/14 1121 Last data filed at 07/30/14 0800  Gross per 24 hour  Intake    460 ml  Output   2075 ml  Net  -1615 ml    PHYSICAL EXAMINATION: Gen: comfortable in bed, no acute distress HEENT: NCAT, EOMi, OP clear, neck supple without masses PULM: Crackles bilaterally throughout both sides CV: RRR, loud systolic murmur, no JVD AB: BS+, soft, nontender, no hsm Ext: warm, trace edema, no clubbing, no cyanosis Derm: no rash or skin breakdown, R 2nd/3rd toes with dry gangrene  Neuro: A&Ox4, CN II-XII intact, MAEW   LABS:  CBC  Recent Labs Lab 07/28/14 1225 07/29/14 0330 07/30/14 0805  WBC 12.5* 8.5 9.7  HGB 12.7 11.2* 12.6  HCT 37.7 32.4* 37.3  PLT 232 208 212   BMET  Recent Labs Lab 07/28/14 1225 07/29/14 0330 07/30/14 0805  NA 130* 131* 133*  K 4.0 3.3* 4.1  CL 90* 94* 95*  CO2 21 25 25   BUN 17 12 13   CREATININE 0.70 0.55 0.66  GLUCOSE 206* 162* 108*   Electrolytes  Recent Labs Lab 07/28/14 1225 07/29/14 0330 07/30/14 0805  CALCIUM  8.5 8.2* 8.5   Sepsis Markers  Recent Labs Lab 07/28/14 1225 07/29/14 0330  LATICACIDVEN 3.5* 2.4*   Liver Enzymes  Recent Labs Lab 07/28/14 1225  AST 18  ALT 24  ALKPHOS 119*  BILITOT 0.8  ALBUMIN 2.8*   Cardiac Enzymes  Recent Labs Lab 07/28/14 1225 07/29/14 0330  TROPONINI <0.30  --   PROBNP 576.5* 753.4*   Glucose  Recent Labs Lab 07/30/14 0520  GLUCAP 114*    Imaging Dg Chest Port 1 View  07/29/2014   CLINICAL DATA:  Hypoxia.  History of breast cancer.  EXAM: PORTABLE CHEST - 1 VIEW  COMPARISON:  07/28/2014.  FINDINGS: Mediastinum hilar structures normal. Mild cardiomegaly. Diffuse severe from interstitial infiltrates are present. Small right pleural effusion. These findings could be related to congestive heart for and interstitial edema severe pneumonitis could also present in this fashion. Interstitial tumor spread could present in this fashion. No acute bony abnormality identified.  IMPRESSION: 1. Severe bilateral pulmonary interstitial infiltrates. Small right pleural effusion. These findings may be related to congestive heart failure an interstitial edema. Severe interstitial pneumonitis could present in this fashion. Interstitial tumor spread could present in this fashion. 2. Cardiomegaly.   Electronically Signed   By: Marcello Moores  Register   On: 07/29/2014 08:25     ASSESSMENT / PLAN:  Principal Problem:   Sepsis Active Problems:   Hyponatremia   Immune disorder  Fever   Diastolic HF (heart failure)   Chronic diastolic heart failure   Acute respiratory failure with hypoxemia   Dyspnea   Pyrexia   Hypoxemia   This is a very interesting case. Objectively, we have a lady with hypoxemic respiratory failure, diffuse interstitial infiltrates, recent eosinophilia and evidence of possible embolic phenomena with (apparently) documented Ascaris in her stool. This is all in the setting of an immunocompromised host as she has been treated with leflunomide and  prednisone for her polymyalgia rheumatica. She also has aortic stenosis.  The differential diagnosis is broad:  Opportunistic infections, healthcare associated PNA with bacterial / viral pathogens, eosinophilic PNA and remotely Ascaris infection.  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     Plan: -CT chest to evaluate parenchyma, pending results will consider FOB -If FOB, send for bacterial, viral, fungal, AFB, PCP, cell count with differential, and cytology -Titrate oxygen for sats > 88% -Continue current therapy as written -Await the records of the positive stool test -pulmonary hygiene: IS, mobilize  Noe Gens, NP-C [facility] Pgr: [phone] or [phone]  Spoke with daughters extensively, explained the entire case again.  They were less than happy that bronch will not be done today.  Patient clearly hypoxemic and may need O2.  Will perform a chest CT today and awaiting results from O&P from stool as I do not believe the outside testing, statistically very unlikely.  Will bronch and BAL only CT is normal from a mass standpoint and negative stool.  If stool is positive or there is a mass then will biopsy during BAL during bronchoscopy.  CC time 40 min.  Patient seen and examined, agree with above note.  I dictated the care and orders written for this patient under my direction.  Rush Farmer, MD [phone]  07/30/2014, 11:21 AM

## 2014-07-31 ENCOUNTER — Inpatient Hospital Stay (HOSPITAL_COMMUNITY): Payer: Medicare Other

## 2014-07-31 DIAGNOSIS — T17908A Unspecified foreign body in respiratory tract, part unspecified causing other injury, initial encounter: Secondary | ICD-10-CM | POA: Insufficient documentation

## 2014-07-31 LAB — CBC
HEMATOCRIT: 37.5 % (ref 36.0–46.0)
Hemoglobin: 12.5 g/dL (ref 12.0–15.0)
MCH: 28.9 pg (ref 26.0–34.0)
MCHC: 33.3 g/dL (ref 30.0–36.0)
MCV: 86.6 fL (ref 78.0–100.0)
Platelets: 188 10*3/uL (ref 150–400)
RBC: 4.33 MIL/uL (ref 3.87–5.11)
RDW: 18.2 % — ABNORMAL HIGH (ref 11.5–15.5)
WBC: 9.3 10*3/uL (ref 4.0–10.5)

## 2014-07-31 LAB — QUANTIFERON TB GOLD ASSAY (BLOOD)
Interferon Gamma Release Assay: NEGATIVE
MITOGEN VALUE: 4.93 [IU]/mL
Quantiferon Nil Value: 0.46 IU/mL
TB Ag value: 0.46 IU/mL
TB Antigen Minus Nil Value: 0 IU/mL

## 2014-07-31 LAB — OVA AND PARASITE EXAMINATION

## 2014-07-31 LAB — BASIC METABOLIC PANEL
Anion gap: 12 (ref 5–15)
BUN: 12 mg/dL (ref 6–23)
CO2: 24 meq/L (ref 19–32)
Calcium: 8.6 mg/dL (ref 8.4–10.5)
Chloride: 94 mEq/L — ABNORMAL LOW (ref 96–112)
Creatinine, Ser: 0.67 mg/dL (ref 0.50–1.10)
GFR calc Af Amer: 89 mL/min — ABNORMAL LOW (ref >90)
GFR calc non Af Amer: 77 mL/min — ABNORMAL LOW (ref >90)
Glucose, Bld: 109 mg/dL — ABNORMAL HIGH (ref 70–99)
Potassium: 4.3 mEq/L (ref 3.7–5.3)
SODIUM: 130 meq/L — AB (ref 137–147)

## 2014-07-31 LAB — URINE CULTURE: Colony Count: 30000

## 2014-07-31 LAB — CULTURE, RESPIRATORY

## 2014-07-31 LAB — CULTURE, RESPIRATORY W GRAM STAIN
Culture: NORMAL
Gram Stain: NONE SEEN

## 2014-07-31 LAB — PROTIME-INR
INR: 1.09 (ref 0.00–1.49)
PROTHROMBIN TIME: 14.3 s (ref 11.6–15.2)

## 2014-07-31 LAB — GLUCOSE, CAPILLARY: GLUCOSE-CAPILLARY: 133 mg/dL — AB (ref 70–99)

## 2014-07-31 MED ORDER — METHYLPREDNISOLONE SODIUM SUCC 40 MG IJ SOLR
40.0000 mg | Freq: Every day | INTRAMUSCULAR | Status: DC
  Administered 2014-07-31 – 2014-08-01 (×2): 40 mg via INTRAVENOUS
  Filled 2014-07-31 (×2): qty 1

## 2014-07-31 MED ORDER — ASPIRIN 300 MG RE SUPP
150.0000 mg | Freq: Every day | RECTAL | Status: DC
  Administered 2014-07-31: 150 mg via RECTAL
  Filled 2014-07-31: qty 1

## 2014-07-31 MED ORDER — CLINDAMYCIN PHOSPHATE 600 MG/50ML IV SOLN
600.0000 mg | Freq: Three times a day (TID) | INTRAVENOUS | Status: DC
  Administered 2014-07-31 – 2014-08-03 (×10): 600 mg via INTRAVENOUS
  Filled 2014-07-31 (×11): qty 50

## 2014-07-31 MED ORDER — FOLIC ACID 5 MG/ML IJ SOLN
1.0000 mg | Freq: Every day | INTRAMUSCULAR | Status: DC
  Administered 2014-07-31 – 2014-08-09 (×10): 1 mg via INTRAVENOUS
  Filled 2014-07-31 (×13): qty 0.2

## 2014-07-31 MED ORDER — VITAMINS A & D EX OINT
TOPICAL_OINTMENT | CUTANEOUS | Status: AC
  Administered 2014-07-31: 1
  Filled 2014-07-31: qty 5

## 2014-07-31 MED ORDER — METOPROLOL TARTRATE 1 MG/ML IV SOLN
5.0000 mg | Freq: Two times a day (BID) | INTRAVENOUS | Status: DC
  Administered 2014-08-01 – 2014-08-04 (×7): 5 mg via INTRAVENOUS
  Filled 2014-07-31 (×8): qty 5

## 2014-07-31 MED ORDER — PANTOPRAZOLE SODIUM 40 MG IV SOLR
40.0000 mg | Freq: Two times a day (BID) | INTRAVENOUS | Status: DC
  Administered 2014-07-31 – 2014-08-04 (×8): 40 mg via INTRAVENOUS
  Filled 2014-07-31 (×9): qty 40

## 2014-07-31 MED ORDER — FLUCONAZOLE 100MG IVPB
100.0000 mg | Freq: Every day | INTRAVENOUS | Status: AC
  Administered 2014-07-31 – 2014-08-05 (×6): 100 mg via INTRAVENOUS
  Filled 2014-07-31 (×7): qty 50

## 2014-07-31 NOTE — Plan of Care (Signed)
Problem: Phase II Progression Outcomes Goal: Progress activity as tolerated unless otherwise ordered Outcome: Progressing     

## 2014-07-31 NOTE — Consult Note (Addendum)
PULMONARY / CRITICAL CARE MEDICINE   Name: Martha Mcbride MRN: [id number] DOB: 25-Jun-1925    ADMISSION DATE:  07/28/2014 CONSULTATION DATE:  10/31  REFERRING MD :  Tyrell Antonio  CHIEF COMPLAINT:  Shortness of breath  INITIAL PRESENTATION: 80 y/o female with polymyalgia on chronic prednisone (5mg ) and leflunomide admitted on 10/31 to [facility] with acute hypoxemic respiratory failure and diffuse bilateral infiltrates.  STUDIES:  11/2 CT: Evidence of emphysema with traction bronchiectasis in multiple areas of interstitial fibrosis. This appearance is consistent with underlying usual interstitial pneumonitis. There are areas of patchy consolidation in both lower lobes as wellas to a lesser extent in the upper lobes. There also moderate pleural effusions. The areas of consolidation are felt to most likely represent superimposed pneumonia. There may be a degree of superimposed congestive heart failure as well given the pleural effusions.5 mm nodular opacity in the posterior segment of the right upper lobe. Areas of atherosclerotic change and cardiac valvular calcification. Areas of relatively mild adenopathy. Cholelithiasis.   SIGNIFICANT EVENTS: 11/1 PCCM consulted for hypoxemic respiratory fx & bilateral diffuse infiltrates   HISTORY OF PRESENT ILLNESS:  80 y.o. female with PMH significant for Polymyalgia on prednisone, severely calcified mitral valve with moderate stenosis, chronic diastolic heart failure,Gangrenous toes on the right foot , last admission 10-27 was treated for health care associated PNA, encephalopathy, and diastolic HF exacerbation. She was brought to the emergency department 10/31 for worsening shortness of breath and hypoxia as noted by a home pulse oximeter that the daughter has. Per daughter patient has been having problem of low oxygen level at night during sleep. She was trying to arrange home oxygen trough patient PCP but was unsuccessful.  Patient was found to be febrile, with  tempeture at 103. Per family patient has been more confuse today. No chest pain or dyspnea complaints. Still with productive cough. WBC at 12, Lactic acid at 3.5. Chest x ray with No significant change in cardiomegaly and diffuse bilateral pulmonary interstitial infiltrates.  PAST MEDICAL HISTORY :   has a past medical history of Arthritis; Polymyalgia; Acid reflux; Immune deficiency disorder; Breast cancer; Giardia; and Pinworms.  has past surgical history that includes Breast lumpectomy; Partial hysterectomy; Tonsillectomy; and TEE without cardioversion (N/A, 07/20/2014). Prior to Admission medications   Medication Sig Start Date End Date Taking? Authorizing Provider  antiseptic oral rinse (BIOTENE) LIQD 15 mLs by Mouth Rinse route as needed for dry mouth.   Yes Historical Provider, MD  aspirin 81 MG chewable tablet Chew 1 tablet (81 mg total) by mouth daily. 07/24/14  Yes Barton Dubois, MD  benzonatate (TESSALON) 200 MG capsule Take 1 capsule (200 mg total) by mouth 3 (three) times daily as needed for cough. 07/24/14  Yes Barton Dubois, MD  fluconazole (DIFLUCAN) 100 MG tablet Take 100 mg by mouth daily.  07/09/14  Yes Historical Provider, MD  folic acid (FOLVITE) 1 MG tablet Take 1 mg by mouth daily.   Yes Historical Provider, MD  furosemide (LASIX) 20 MG tablet Take 1 tablet (20 mg total) by mouth 2 (two) times daily with breakfast and lunch. 07/24/14  Yes Barton Dubois, MD  hydroxypropyl methylcellulose / hypromellose (ISOPTO TEARS / GONIOVISC) 2.5 % ophthalmic solution Place 1 drop into both eyes every morning.   Yes Historical Provider, MD  levofloxacin (LEVAQUIN) 750 MG tablet Take 1 tablet (750 mg total) by mouth every other day. 07/25/14  Yes Barton Dubois, MD  loratadine (CLARITIN) 10 MG tablet Take 1 tablet (10 mg total)  by mouth daily. 07/24/14  Yes Barton Dubois, MD  metoprolol succinate (TOPROL-XL) 12.5 mg TB24 24 hr tablet Take 0.5 tablets (12.5 mg total) by mouth daily. 07/24/14   Yes Barton Dubois, MD  potassium chloride SA (K-DUR,KLOR-CON) 20 MEQ tablet Take 2 tablets (40 mEq total) by mouth daily. 07/24/14  Yes Barton Dubois, MD  predniSONE (DELTASONE) 5 MG tablet Take 5 mg by mouth daily with breakfast.   Yes Historical Provider, MD  RABEprazole (ACIPHEX) 20 MG tablet Take 20 mg by mouth daily.   Yes Historical Provider, MD  saccharomyces boulardii (FLORASTOR) 250 MG capsule Take 1 capsule (250 mg total) by mouth 2 (two) times daily. 07/24/14  Yes Barton Dubois, MD   Allergies  Allergen Reactions  . Doxycycline Nausea And Vomiting  . Other Nausea And Vomiting and Other (See Comments)    Anesthesia makes her very sleepy and makes it hard for her to come out of it.   Marland Kitchen Penicillins Hives and Nausea And Vomiting  . Sulfa Antibiotics Nausea And Vomiting    FAMILY HISTORY:  has no family status information on file.  SOCIAL HISTORY:  reports that she has never smoked. She does not have any smokeless tobacco history on file. She reports that she does not drink alcohol or use illicit drugs.   SUBJECTIVE: Denies dyspnea, chest pain. No abdominal pain.   VITAL SIGNS: Temp:  [97.2 F (36.2 C)-99.1 F (37.3 C)] 98.9 F (37.2 C) (11/03 0800) Pulse Rate:  [88-102] 91 (11/03 0600) Resp:  [17-29] 24 (11/03 0600) BP: (114-157)/(44-67) 122/44 mmHg (11/03 0400) SpO2:  [91 %-100 %] 93 % (11/03 0600) Weight:  [63.2 kg (139 lb 5.3 oz)] 63.2 kg (139 lb 5.3 oz) (11/03 0400) HEMODYNAMICS:   VENTILATOR SETTINGS:   INTAKE / OUTPUT:  Intake/Output Summary (Last 24 hours) at 07/31/14 0903 Last data filed at 07/31/14 0600  Gross per 24 hour  Intake    880 ml  Output   2100 ml  Net  -1220 ml    PHYSICAL EXAMINATION: Gen: comfortable in bed, no acute distress HEENT: NCAT, EOMi, OP clear, neck supple without masses PULM: Crackles bilaterally throughout both sides. R>L crackles CV: RRR, loud systolic murmur, no JVD AB: BS+, soft, nontender, no hsm Ext: warm, trace  edema, no clubbing, no cyanosis Derm: no rash or skin breakdown, R 2nd/3rd toes with dry gangrene  Neuro: A&Ox4, CN II-XII intact, MAEW  LABS:  CBC  Recent Labs Lab 07/29/14 0330 07/30/14 0805 07/31/14 0340  WBC 8.5 9.7 9.3  HGB 11.2* 12.6 12.5  HCT 32.4* 37.3 37.5  PLT 208 212 188   Coag's  Recent Labs Lab 07/31/14 0340  INR 1.09   BMET  Recent Labs Lab 07/29/14 0330 07/30/14 0805 07/31/14 0340  NA 131* 133* 130*  K 3.3* 4.1 4.3  CL 94* 95* 94*  CO2 25 25 24   BUN 12 13 12   CREATININE 0.55 0.66 0.67  GLUCOSE 162* 108* 109*   Electrolytes  Recent Labs Lab 07/29/14 0330 07/30/14 0805 07/31/14 0340  CALCIUM 8.2* 8.5 8.6   Sepsis Markers  Recent Labs Lab 07/28/14 1225 07/29/14 0330  LATICACIDVEN 3.5* 2.4*   ABG No results for input(s): PHART, PCO2ART, PO2ART in the last 168 hours. Liver Enzymes  Recent Labs Lab 07/28/14 1225  AST 18  ALT 24  ALKPHOS 119*  BILITOT 0.8  ALBUMIN 2.8*   Cardiac Enzymes  Recent Labs Lab 07/28/14 1225 07/29/14 0330 07/30/14 1055  TROPONINI <0.30  --  <  0.30  PROBNP 576.5* 753.4*  --    Glucose  Recent Labs Lab 07/30/14 0520  GLUCAP 114*    Imaging Ct Chest Wo Contrast  07/30/2014   CLINICAL DATA:  Fever and elevated white blood cell count. Chronic shortness of breath and chest pain. History of breast carcinoma in 2000  EXAM: CT CHEST WITHOUT CONTRAST  TECHNIQUE: Multidetector CT imaging of the chest was performed following the standard protocol without IV contrast material administration.  COMPARISON:  Chest radiograph August 08, 2014  FINDINGS: There is underlying emphysematous change with interstitial fibrosis and honeycombing throughout both upper lobes, primarily medially as well as in the lingula, right middle lobe, and both lower lobes. There is diffuse traction type bronchiectasis.  There are moderate free-flowing effusions bilaterally. There are areas of consolidation in both lower lobes in a  somewhat patchy distribution. Lesser degrees of airspace consolidation are noted in both upper lobes.  On axial slice 32 series 5, there is a 5 mm nodular opacity in the posterior segment of the right upper lobe.  There is atherosclerotic change in the aorta but no aneurysm. No pulmonary embolus is seen on this noncontrast enhanced study. There is extensive calcification in the mitral valve and aortic valve regions.  There are multiple small lymph nodes throughout the mediastinum. There is a lymph node in the anterior precarinal region measuring 1.6 by 1.4 cm. A second lymph node in this area measures 2.0 x 1.2 cm. A lymph node in the aortopulmonary window region measures 1.5 x 1.3 cm.  There is a moderate hiatal hernia.  In the visualized upper abdominal region, there is atherosclerotic change in the aorta. There is cholelithiasis. There is a small cyst in the posterior segment of the right lobe of the liver.  There is degenerative change in the thoracic spine with increase in kyphosis. There are no blastic or lytic bone lesions. Thyroid appears unremarkable.  IMPRESSION: Evidence of emphysema with traction bronchiectasis in multiple areas of interstitial fibrosis. This appearance is consistent with underlying usual interstitial pneumonitis.  There are areas of patchy consolidation in both lower lobes as well as to a lesser extent in the upper lobes. There also moderate pleural effusions. The areas of consolidation are felt to most likely represent superimposed pneumonia. There may be a degree of superimposed congestive heart failure as well given the pleural effusions.  5 mm nodular opacity in the posterior segment of the right upper lobe. Followup of this nodular opacity should be based on Fleischner Society guidelines. If the patient is at high risk for bronchogenic carcinoma, follow-up chest CT at 6-12 months is recommended. If the patient is at low risk for bronchogenic carcinoma, follow-up chest CT at 12  months is recommended. This recommendation follows the consensus statement: Guidelines for Management of Small Pulmonary Nodules Detected on CT Scans: A Statement from the [place] as published in Radiology 2005;237:395-400.  Areas of atherosclerotic change and cardiac valvular calcification.  Areas of relatively mild adenopathy.  Cholelithiasis.  Given the areas of questionable pneumonia, it may be prudent to consider a followup study in 2-3 weeks to assess for clearing.   Electronically Signed   By: Lowella Grip M.D.   On: 07/30/2014 11:39     ASSESSMENT / PLAN:  PULMONARY OETT A:  Acute hypoxic respiratory failure Pulmonary bronchietasis  Pulmonary edema Pneumonitis Aspiration PNA vs HCAP vs viral vs eosinophilic PNA  P:   Bronch needed for cultures but family refusing, evidently remember MD from previous  case years ago and not wanting him to do procedure, daughter informed that procedure is needed but they are requesting a different MD which is not available at this time.  Will likely delay til next week when another MD is available.  Ultimately, CT is consistent with UIP with fibrosis and traction bronchiectasis but would not feel comfortable starting steroids without ruling out infection via bronch and BAL. Oxygen protocol for SpO2 >88%  CARDIOVASCULAR CVL A: Chronic diastolic heart failure P:  Continue toporol xl Continue lasix with goal net negative  RENAL A:  hyponatremia P:   F/U BMET  GASTROINTESTINAL A:  Diarrhea Malnutrition NPO for bronch Possible aspiration given patients coughing and choking after eating, per daughter. Will need swallow eval P:   Unlikely to have ascariasis, giardia and enterobiasis in someone without recent travel to a developing country even with some immunosuppression. Ova and parasite stool pending.  Strongyloids antibody pending.  Quantiferon Tb pending.  Ensure. Nutrition consulted. Probiotics.  HEMATOLOGIC A:  No  acute issues P:  Lovenox for DVT prophylaxis.  INFECTIOUS A:  Sepsis P:   BCx2 10/31 no growth. UC 10/31>> staph species. Sputum 10/31: normal flora. Abx: cefepime, diflucan.  ENDOCRINE A:  No acute issues P:     NEUROLOGIC A:  Polymyalgia rheumatica P:   On prednisone daily, will need higher doses for UIP but again will need a negative bronch but family is refusing at this time.  DERM A: gangrenous toes on right foot Decubitus ulcer P:  Follow with vascular as outpatient Wound care consulted and following  FAMILY  - Updates: Family refused interaction.  TODAY'S SUMMARY: As above, family remembers me from years ago and apparently was not satisfied with outcome and refused procedure if I was the one to do it.  Unfortunately there are no other MD's available with our current schedule this week so bronchoscopy will not be performed this week.  May be done as outpatient post discharge.  Patient will need high dose steroids but will need ok by ID first, bronchoscopy would have been helpful to r/o infection.  In the meantime, will attempt to see if another pulmonologist is available that the family would be more comfortable with but thus far it is not looking promising, can follow up as outpatient for bronchoscopy if still needed.  PCCM will sign off, please call back if needed.  Patient seen and examined, agree with above note.  I dictated the care and orders written for this patient under my direction.   Rush Farmer, M.D. [facility] Pulmonary/Critical Care Medicine. Pager: [phone]. After hours pager: [phone].  07/31/2014, 9:03 AM

## 2014-07-31 NOTE — Progress Notes (Addendum)
[facility] PROGRESS NOTE  Martha Mcbride DJS:[id number] DOB: 17-Feb-1925 DOA: 07/28/2014 PCP: Simona Huh, MD  Assessment/Plan: 1-Sepsis: Patient present with fever, BP in the 100 to 90 range, Lactic acid at 3.5. Chest x ray with persistent infiltrates. WBC at 12.  -Will continue cefepime. Vancomycin DC by ID.  -Blood culture no growth to date. .  -UA with trace leukocytes.  urine culture Staph coagulase negative.  -Lactic acid decrease to 2.4---from 3.5.   2-Acute Hypoxic Respiratory Failure. Pneumonitis ?  Chest x ray 11-01 showed worsening infiltrates: pulmonary edema, Pneumonitis.  CCM consulted for further evaluation of pneumonitis.   Continue with cefepime day 3. dfere antibiotics changes to ID.  Continue with treatment of PNA.  Patient will benefit form home oxygen, specially at night.  Sputum culture normal oro pharyngeal flora.  Bronchoscopy per pulmonary if indicated.   3-History of giardia, Ascaris Lumbricoids: didn't finish treatment. 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 \ ID consulted for treatment recommendation and further evaluation. Of note patient had stool culture negative for parasites last admission.  -Ova and parasite stool pending.  -strongyloids antibody pending.  -Quantiferon Tb pending.  -Records pending.   -Chronic Diastolic HF;   BNP at 885, last admission at 1467. Patient appears compensated.  Chest x ray with worsening infiltrates, edema, pneumonitis.  Continue with lasix to 20 Mg IV daily.  Stable. Negative 3 L.   4-Polymyalgia Rheumatica:  On prednisone 5 mg daily. Received one time dose stress dose steroid.  leflunomide was discontinue during last admission.   5-Gangrenous toes on the right foot  -Unclear etiology at this time, pulses on the right foot palpable with good perfusion  -Follow up with vascular outpatient.  -stable.  -wound care following.   6-Decubitus ulcer  Wound care consulted. Appreciate  recommendation.   7-Malnutrition; appetite improving. Ensure. Nutrition consulted.  8-Dysphagia; Need M Barium swallow. On clear diet.   Appreciate consultant help.   Code Status: Full Code.  Family Communication: Care discussed with Daughter.  Disposition Plan: Remain in the step down unit.    Consultants:  Pulmonary  ID.   Procedures:  none  Antibiotics:  Vancomycin   Cefepime   HPI/Subjective: Denies dyspnea, chest pain, no BM. No abdominal pain.   Objective: Filed Vitals:   07/31/14 0600  BP:   Pulse: 91  Temp:   Resp: 24    Intake/Output Summary (Last 24 hours) at 07/31/14 0846 Last data filed at 07/31/14 0600  Gross per 24 hour  Intake    880 ml  Output   2100 ml  Net  -1220 ml   Filed Weights   07/28/14 2000 07/29/14 0200 07/31/14 0400  Weight: 64.7 kg (142 lb 10.2 oz) 66.3 kg (146 lb 2.6 oz) 63.2 kg (139 lb 5.3 oz)    Exam:   General:  No distress.   Cardiovascular: S 1, S 2 RRR  Respiratory:Bilateral crackles.   Abdomen: BS present, soft. Nt  Musculoskeletal: no edema.   Data Reviewed: Basic Metabolic Panel:  Recent Labs Lab 07/28/14 1225 07/29/14 0330 07/30/14 0805 07/31/14 0340  NA 130* 131* 133* 130*  K 4.0 3.3* 4.1 4.3  CL 90* 94* 95* 94*  CO2 21 25 25 24   GLUCOSE 206* 162* 108* 109*  BUN 17 12 13 12   CREATININE 0.70 0.55 0.66 0.67  CALCIUM 8.5 8.2* 8.5 8.6   Liver Function Tests:  Recent Labs Lab 07/28/14 1225  AST 18  ALT 24  ALKPHOS 119*  BILITOT  0.8  PROT 6.5  ALBUMIN 2.8*   No results for input(s): LIPASE, AMYLASE in the last 168 hours. No results for input(s): AMMONIA in the last 168 hours. CBC:  Recent Labs Lab 07/28/14 1225 07/29/14 0330 07/30/14 0805 07/31/14 0340  WBC 12.5* 8.5 9.7 9.3  NEUTROABS 8.4*  --   --   --   HGB 12.7 11.2* 12.6 12.5  HCT 37.7 32.4* 37.3 37.5  MCV 87.5 85.9 86.5 86.6  PLT 232 208 212 188   Cardiac Enzymes:  Recent Labs Lab 07/28/14 1225 07/30/14 1055   TROPONINI <0.30 <0.30   BNP (last 3 results)  Recent Labs  07/19/14 2039 07/28/14 1225 07/29/14 0330  PROBNP 1467.0* 576.5* 753.4*   CBG:  Recent Labs Lab 07/30/14 0520  GLUCAP 114*    Recent Results (from the past 240 hour(s))  Blood culture (routine x 2)     Status: None (Preliminary result)   Collection Time: 07/28/14 12:21 PM  Result Value Ref Range Status   Specimen Description BLOOD LEFT WRIST  4 ML IN [facility] BOTTLE  Final   Special Requests NONE  Final   Culture  Setup Time   Final    07/28/2014 20:11 Performed at [facility]    Culture   Final           BLOOD CULTURE RECEIVED NO GROWTH TO DATE CULTURE WILL BE HELD FOR 5 DAYS BEFORE ISSUING A FINAL NEGATIVE REPORT Performed at [facility]    Report Status PENDING  Incomplete  Blood culture (routine x 2)     Status: None (Preliminary result)   Collection Time: 07/28/14 12:49 PM  Result Value Ref Range Status   Specimen Description BLOOD BLOOD LEFT FOREARM  Final   Special Requests BOTTLES DRAWN AEROBIC AND ANAEROBIC 5ML  Final   Culture  Setup Time   Final    07/28/2014 20:10 Performed at [facility]    Culture   Final           BLOOD CULTURE RECEIVED NO GROWTH TO DATE CULTURE WILL BE HELD FOR 5 DAYS BEFORE ISSUING A FINAL NEGATIVE REPORT Performed at [facility]    Report Status PENDING  Incomplete  Urine culture     Status: None (Preliminary result)   Collection Time: 07/28/14  2:03 PM  Result Value Ref Range Status   Specimen Description URINE, CLEAN CATCH  Final   Special Requests NONE  Final   Culture  Setup Time   Final    07/29/2014 02:28 Performed at [facility]   Final    30,000 COLONIES/ML Performed at [facility]    Culture   Final    STAPHYLOCOCCUS SPECIES (COAGULASE NEGATIVE) Note: RIFAMPIN AND GENTAMICIN SHOULD NOT BE USED AS SINGLE DRUGS FOR TREATMENT OF STAPH INFECTIONS. Performed at [facility]     Report Status PENDING  Incomplete  MRSA PCR Screening     Status: None   Collection Time: 07/28/14  3:52 PM  Result Value Ref Range Status   MRSA by PCR NEGATIVE NEGATIVE Final    Comment:        The GeneXpert MRSA Assay (FDA approved for NASAL specimens only), is one component of a comprehensive MRSA colonization surveillance program. It is not intended to diagnose MRSA infection nor to guide or monitor treatment for MRSA infections.  Culture, expectorated sputum-assessment     Status: None   Collection Time: 07/28/14  4:21  PM  Result Value Ref Range Status   Specimen Description SPUTUM  Final   Special Requests Immunocompromised  Final   Sputum evaluation   Final    THIS SPECIMEN IS ACCEPTABLE. RESPIRATORY CULTURE REPORT TO FOLLOW.   Report Status 07/28/2014 FINAL  Final  Culture, respiratory (NON-Expectorated)     Status: None (Preliminary result)   Collection Time: 07/28/14  4:21 PM  Result Value Ref Range Status   Specimen Description SPUTUM  Final   Special Requests NONE  Final   Gram Stain   Final    NO WBC SEEN FEW SQUAMOUS EPITHELIAL CELLS PRESENT MODERATE GRAM POSITIVE RODS FEW GRAM NEGATIVE RODS FEW GRAM POSITIVE COCCI IN PAIRS    Culture   Final    NORMAL OROPHARYNGEAL FLORA Performed at [facility]    Report Status PENDING  Incomplete     Studies: Ct Chest Wo Contrast  07/30/2014   CLINICAL DATA:  Fever and elevated white blood cell count. Chronic shortness of breath and chest pain. History of breast carcinoma in 2000  EXAM: CT CHEST WITHOUT CONTRAST  TECHNIQUE: Multidetector CT imaging of the chest was performed following the standard protocol without IV contrast material administration.  COMPARISON:  Chest radiograph August 08, 2014  FINDINGS: There is underlying emphysematous change with interstitial fibrosis and honeycombing throughout both upper lobes, primarily medially as well as in the lingula, right middle lobe, and both lower lobes. There  is diffuse traction type bronchiectasis.  There are moderate free-flowing effusions bilaterally. There are areas of consolidation in both lower lobes in a somewhat patchy distribution. Lesser degrees of airspace consolidation are noted in both upper lobes.  On axial slice 32 series 5, there is a 5 mm nodular opacity in the posterior segment of the right upper lobe.  There is atherosclerotic change in the aorta but no aneurysm. No pulmonary embolus is seen on this noncontrast enhanced study. There is extensive calcification in the mitral valve and aortic valve regions.  There are multiple small lymph nodes throughout the mediastinum. There is a lymph node in the anterior precarinal region measuring 1.6 by 1.4 cm. A second lymph node in this area measures 2.0 x 1.2 cm. A lymph node in the aortopulmonary window region measures 1.5 x 1.3 cm.  There is a moderate hiatal hernia.  In the visualized upper abdominal region, there is atherosclerotic change in the aorta. There is cholelithiasis. There is a small cyst in the posterior segment of the right lobe of the liver.  There is degenerative change in the thoracic spine with increase in kyphosis. There are no blastic or lytic bone lesions. Thyroid appears unremarkable.  IMPRESSION: Evidence of emphysema with traction bronchiectasis in multiple areas of interstitial fibrosis. This appearance is consistent with underlying usual interstitial pneumonitis.  There are areas of patchy consolidation in both lower lobes as well as to a lesser extent in the upper lobes. There also moderate pleural effusions. The areas of consolidation are felt to most likely represent superimposed pneumonia. There may be a degree of superimposed congestive heart failure as well given the pleural effusions.  5 mm nodular opacity in the posterior segment of the right upper lobe. Followup of this nodular opacity should be based on Fleischner Society guidelines. If the patient is at high risk for  bronchogenic carcinoma, follow-up chest CT at 6-12 months is recommended. If the patient is at low risk for bronchogenic carcinoma, follow-up chest CT at 12 months is recommended. This recommendation follows the  consensus statement: Guidelines for Management of Small Pulmonary Nodules Detected on CT Scans: A Statement from the [employer] as published in Radiology 2005;237:395-400.  Areas of atherosclerotic change and cardiac valvular calcification.  Areas of relatively mild adenopathy.  Cholelithiasis.  Given the areas of questionable pneumonia, it may be prudent to consider a followup study in 2-3 weeks to assess for clearing.   Electronically Signed   By: Lowella Grip M.D.   On: 07/30/2014 11:39    Scheduled Meds: . antiseptic oral rinse  7 mL Mouth Rinse q12n4p  . aspirin  81 mg Oral Daily  . butamben-tetracaine-benzocaine  1 spray Topical Once  . ceFEPime (MAXIPIME) IV  1 g Intravenous Q12H  . chlorhexidine  15 mL Mouth Rinse BID  . enoxaparin (LOVENOX) injection  40 mg Subcutaneous Q24H  . fluconazole  100 mg Oral Daily  . folic acid  1 mg Oral Daily  . furosemide  20 mg Intravenous Daily  . lidocaine  1 application Topical Once  . loratadine  10 mg Oral Daily  . metoprolol succinate  12.5 mg Oral Daily  . pantoprazole  40 mg Oral Daily  . polyvinyl alcohol  1 drop Both Eyes Daily  . potassium chloride SA  40 mEq Oral Daily  . predniSONE  5 mg Oral Q breakfast  . saccharomyces boulardii  250 mg Oral BID  . sodium chloride  3 mL Intravenous Q12H   Continuous Infusions:   Principal Problem:   Sepsis Active Problems:   Hyponatremia   Immune disorder   Fever   Diastolic HF (heart failure)   Chronic diastolic heart failure   Acute respiratory failure with hypoxemia   Dyspnea   Pyrexia   Hypoxemia   Pulmonary infiltrate   Malnutrition of moderate degree    Time spent: 35 minutes.     Niel Hummer A  [facility] Pager [phone]. If 7PM-7AM, please  contact night-coverage at [email or web address], password [email or web address] 07/31/2014, 8:46 AM  LOS: 3 days     Dysphagia; explain options to patient ' family. Will consult GI.  I discussed with ID, Ok to start Steroids.  She will need prednisone taper at discharge.

## 2014-07-31 NOTE — Progress Notes (Signed)
Medical records received from: Dr. Amil Amen and Dr. Marisue Humble. Repeat request sent to [facility]

## 2014-07-31 NOTE — Progress Notes (Signed)
[facility]  Date of Admission:  07/28/2014  Antibiotics: clindamycin  Subjective: Some sob, cough  Objective: Temp:  [97.2 F (36.2 C)-99.1 F (37.3 C)] 98.9 F (37.2 C) (11/03 0800) Pulse Rate:  [88-94] 91 (11/03 1201) Resp:  [17-29] 24 (11/03 0600) BP: (114-144)/(44-81) 144/81 mmHg (11/03 1201) SpO2:  [91 %-97 %] 93 % (11/03 0600) Weight:  [139 lb 5.3 oz (63.2 kg)] 139 lb 5.3 oz (63.2 kg) (11/03 0400)  General: awake, nad Skin: no rashes Lungs: some rhonchi, on mask Cor: RRR Abdomen: soft, nt Ext: no edema  Lab Results Lab Results  Component Value Date   WBC 9.3 07/31/2014   HGB 12.5 07/31/2014   HCT 37.5 07/31/2014   MCV 86.6 07/31/2014   PLT 188 07/31/2014    Lab Results  Component Value Date   CREATININE 0.67 07/31/2014   BUN 12 07/31/2014   NA 130* 07/31/2014   K 4.3 07/31/2014   CL 94* 07/31/2014   CO2 24 07/31/2014    Lab Results  Component Value Date   ALT 24 07/28/2014   AST 18 07/28/2014   ALKPHOS 119* 07/28/2014   BILITOT 0.8 07/28/2014      Microbiology: Recent Results (from the past 240 hour(s))  Blood culture (routine x 2)     Status: None (Preliminary result)   Collection Time: 07/28/14 12:21 PM  Result Value Ref Range Status   Specimen Description BLOOD LEFT WRIST  4 ML IN [facility] BOTTLE  Final   Special Requests NONE  Final   Culture  Setup Time   Final    07/28/2014 20:11 Performed at [facility]    Culture   Final           BLOOD CULTURE RECEIVED NO GROWTH TO DATE CULTURE WILL BE HELD FOR 5 DAYS BEFORE ISSUING A FINAL NEGATIVE REPORT Performed at [facility]    Report Status PENDING  Incomplete  Blood culture (routine x 2)     Status: None (Preliminary result)   Collection Time: 07/28/14 12:49 PM  Result Value Ref Range Status   Specimen Description BLOOD BLOOD LEFT FOREARM  Final   Special Requests BOTTLES DRAWN AEROBIC AND ANAEROBIC 5ML  Final   Culture  Setup Time   Final      07/28/2014 20:10 Performed at [facility]    Culture   Final           BLOOD CULTURE RECEIVED NO GROWTH TO DATE CULTURE WILL BE HELD FOR 5 DAYS BEFORE ISSUING A FINAL NEGATIVE REPORT Performed at [facility]    Report Status PENDING  Incomplete  Urine culture     Status: None   Collection Time: 07/28/14  2:03 PM  Result Value Ref Range Status   Specimen Description URINE, CLEAN CATCH  Final   Special Requests NONE  Final   Culture  Setup Time   Final    07/29/2014 02:28 Performed at [place]   Final    30,000 COLONIES/ML Performed at [facility]    Culture   Final    STAPHYLOCOCCUS SPECIES (COAGULASE NEGATIVE) Note: RIFAMPIN AND GENTAMICIN SHOULD NOT BE USED AS SINGLE DRUGS FOR TREATMENT OF STAPH INFECTIONS. Performed at [facility]    Report Status 07/31/2014 FINAL  Final   Organism ID, Bacteria STAPHYLOCOCCUS SPECIES (COAGULASE NEGATIVE)  Final      Susceptibility   Staphylococcus species (coagulase negative) - MIC*  GENTAMICIN <=0.5 SENSITIVE Sensitive     LEVOFLOXACIN >=8 RESISTANT Resistant     NITROFURANTOIN <=16 SENSITIVE Sensitive     OXACILLIN >=4 RESISTANT Resistant     PENICILLIN >=0.5 RESISTANT Resistant     RIFAMPIN <=0.5 SENSITIVE Sensitive     TRIMETH/SULFA <=10 SENSITIVE Sensitive     VANCOMYCIN 1 SENSITIVE Sensitive     TETRACYCLINE <=1 SENSITIVE Sensitive     * STAPHYLOCOCCUS SPECIES (COAGULASE NEGATIVE)  MRSA PCR Screening     Status: None   Collection Time: 07/28/14  3:52 PM  Result Value Ref Range Status   MRSA by PCR NEGATIVE NEGATIVE Final    Comment:        The GeneXpert MRSA Assay (FDA approved for NASAL specimens only), is one component of a comprehensive MRSA colonization surveillance program. It is not intended to diagnose MRSA infection nor to guide or monitor treatment for MRSA infections.  Culture, expectorated sputum-assessment     Status: None   Collection  Time: 07/28/14  4:21 PM  Result Value Ref Range Status   Specimen Description SPUTUM  Final   Special Requests Immunocompromised  Final   Sputum evaluation   Final    THIS SPECIMEN IS ACCEPTABLE. RESPIRATORY CULTURE REPORT TO FOLLOW.   Report Status 07/28/2014 FINAL  Final  Culture, respiratory (NON-Expectorated)     Status: None   Collection Time: 07/28/14  4:21 PM  Result Value Ref Range Status   Specimen Description SPUTUM  Final   Special Requests NONE  Final   Gram Stain   Final    NO WBC SEEN FEW SQUAMOUS EPITHELIAL CELLS PRESENT MODERATE GRAM POSITIVE RODS FEW GRAM NEGATIVE RODS FEW GRAM POSITIVE COCCI IN PAIRS    Culture   Final    NORMAL OROPHARYNGEAL FLORA Performed at [facility]    Report Status 07/31/2014 FINAL  Final  Ova and parasite examination     Status: None   Collection Time: 07/29/14  7:28 PM  Result Value Ref Range Status   Specimen Description STOOL  Final   Special Requests NONE  Final   Ova and parasites   Final    NO OVA OR PARASITES SEEN NO STRONGYLOIDES STERCORALIS SEEN Performed at [facility]    Report Status 07/31/2014 FINAL  Final    Studies/Results: Ct Chest Wo Contrast  07/30/2014   CLINICAL DATA:  Fever and elevated white blood cell count. Chronic shortness of breath and chest pain. History of breast carcinoma in 2000  EXAM: CT CHEST WITHOUT CONTRAST  TECHNIQUE: Multidetector CT imaging of the chest was performed following the standard protocol without IV contrast material administration.  COMPARISON:  Chest radiograph August 08, 2014  FINDINGS: There is underlying emphysematous change with interstitial fibrosis and honeycombing throughout both upper lobes, primarily medially as well as in the lingula, right middle lobe, and both lower lobes. There is diffuse traction type bronchiectasis.  There are moderate free-flowing effusions bilaterally. There are areas of consolidation in both lower lobes in a somewhat patchy  distribution. Lesser degrees of airspace consolidation are noted in both upper lobes.  On axial slice 32 series 5, there is a 5 mm nodular opacity in the posterior segment of the right upper lobe.  There is atherosclerotic change in the aorta but no aneurysm. No pulmonary embolus is seen on this noncontrast enhanced study. There is extensive calcification in the mitral valve and aortic valve regions.  There are multiple small lymph nodes throughout the mediastinum. There is a  lymph node in the anterior precarinal region measuring 1.6 by 1.4 cm. A second lymph node in this area measures 2.0 x 1.2 cm. A lymph node in the aortopulmonary window region measures 1.5 x 1.3 cm.  There is a moderate hiatal hernia.  In the visualized upper abdominal region, there is atherosclerotic change in the aorta. There is cholelithiasis. There is a small cyst in the posterior segment of the right lobe of the liver.  There is degenerative change in the thoracic spine with increase in kyphosis. There are no blastic or lytic bone lesions. Thyroid appears unremarkable.  IMPRESSION: Evidence of emphysema with traction bronchiectasis in multiple areas of interstitial fibrosis. This appearance is consistent with underlying usual interstitial pneumonitis.  There are areas of patchy consolidation in both lower lobes as well as to a lesser extent in the upper lobes. There also moderate pleural effusions. The areas of consolidation are felt to most likely represent superimposed pneumonia. There may be a degree of superimposed congestive heart failure as well given the pleural effusions.  5 mm nodular opacity in the posterior segment of the right upper lobe. Followup of this nodular opacity should be based on Fleischner Society guidelines. If the patient is at high risk for bronchogenic carcinoma, follow-up chest CT at 6-12 months is recommended. If the patient is at low risk for bronchogenic carcinoma, follow-up chest CT at 12 months is  recommended. This recommendation follows the consensus statement: Guidelines for Management of Small Pulmonary Nodules Detected on CT Scans: A Statement from the [employer] as published in Radiology 2005;237:395-400.  Areas of atherosclerotic change and cardiac valvular calcification.  Areas of relatively mild adenopathy.  Cholelithiasis.  Given the areas of questionable pneumonia, it may be prudent to consider a followup study in 2-3 weeks to assess for clearing.   Electronically Signed   By: Lowella Grip M.D.   On: 07/30/2014 11:39   Dg Swallowing Func-speech Pathology  07/31/2014   Macario Golds, CCC-SLP     07/31/2014  1:52 PM Objective Swallowing Evaluation: Modified Barium Swallowing Study   Patient Details  Name: Martha Mcbride MRN: [id number] Date of Birth: 01-19-1925  Today's Date: 07/31/2014 Time: 1245-1315 SLP Time Calculation (min): 30 min  Past Medical History:  Past Medical History  Diagnosis Date  . Arthritis   . Polymyalgia   . Acid reflux   . Immune deficiency disorder     autoimmune disorder - polymyalgia - on prednisone  . Breast cancer     Treated with lumpectomy and radiation  . Giardia   . Pinworms    Past Surgical History:  Past Surgical History  Procedure Laterality Date  . Breast lumpectomy    . Partial hysterectomy    . Tonsillectomy    . Tee without cardioversion N/A 07/20/2014    Procedure: TRANSESOPHAGEAL ECHOCARDIOGRAM (TEE);  Surgeon: Candee Furbish, MD;  Location: [facility];  Service: Cardiovascular;   Laterality: N/A;   HPI:  80 y/o female with polymyalgia on chronic prednisone (5mg ) and  leflunomide admitted on 10/31 to [facility] with acute hypoxemic  respiratory failure and diffuse bilateral infiltrates per MD  note.  Pt also with recent Eosinophilia, CHF, acid reflux,  parasitic diagnosis - giardia, pinworm, roundworms.  Order for  swallow evaluation received and daughter [name] questioned if pt  may dysphagia.  Pt denies dysphagia but admits to "memory  problem" and  occasional choking.  She does take her pills with  applesauce premorbidly - daughter [name] via speaker  phone stated  this was only due to pt consuming a full cup of water with pills  and complaining of being full - therefore [name] recommended pt  take pills with applesauce.  CT Chest 11/2 showed "Evidence of  emphysema with traction bronchiectasis in multiple areas of  interstitial fibrosis. This appearance is consistent with  underlying usual interstitial pneumonitis. There are areas of  patchy consolidation in both lower lobes as well as to a lesser  extent in the upper lobes. There also moderate pleural effusions.  The areas of consolidation are felt to most likely represent  superimposed pneumonia. There may be a degree of superimposed  congestive heart failure as well given the pleural effusions. 5  mm nodular opacity in the posterior segment of the right upper  lobe.  Areas of atherosclerotic change and cardiac valvular  calcification."     Assessment / Plan / Recommendation Clinical Impression  Dysphagia Diagnosis: Moderate oral phase dysphagia;Moderate  pharyngeal phase dysphagia;Moderate cervical esophageal phase  dysphagia  Clinical impression: Moderate oropharyngeal and suspected  cervical esophageal dysphagia with + aspiration or penetration  with each consistency tested.  Sensorimotor deficits noted  impacting swallow ability.   Premature spillage of boluses into pharynx noted d/t weakness and  compromised laryngeal elevation resulted in + aspiration of  nectar *mixing with secretions* before the swallow.  She senses  penetration/aspiration intermittently and can expectorate but was  noted to be excessively fatigued and mildly dyspenic after just a  few tsps of intake.  Further boluses were re-penetrated or  aspirated.  Moderate vallecular residuals also noted with pudding  and cracker more than liquids WITHOUT pt awareness.  Pt also is  aspirating secretions that mix with barium.     Pt is at high  aspiration risk currently and recommend she be NPO  except small single ice chips after oral care.  ? If aspiration  could be contributing to pt's fevers, pneumonia.  Daughter [name]  present during testing and both pt/daughter educated to findings.     Further informed [name] that feeding tubes do not prevent  aspiration.  SLP uncertain of pt's swallow prognosis given ?  source of oropharyngeal dysphagia and pt's deconditioning and  ongoing illness x2 months per [name]. Will follow up to help with  care plan for to determine readiness for repeat evaluation if  indicated.     Treatment Recommendation  Therapy as outlined in treatment plan below    Diet Recommendation NPO;Ice chips PRN after oral care (single  small ice chips after oral care pending plans)        Other  Recommendations Oral Care Recommendations: Oral care Q4  per protocol   Follow Up Recommendations    TBD   Frequency and Duration min 1 x/week  1 week   Pertinent Vitals/Pain Febrile, congested     General Date of Onset: 07/30/14 HPI: 80 y/o female with polymyalgia on chronic prednisone (5mg )  and leflunomide admitted on 10/31 to [facility] with acute hypoxemic  respiratory failure and diffuse bilateral infiltrates per MD  note.  Pt also with recent Eosinophilia, CHF, acid reflux,  parasitic diagnosis - giardia, pinworm, roundworms.  Order for  swallow evaluation received and daughter [name] questioned if pt  may dysphagia.  Pt denies dysphagia but admits to "memory  problem" and occasional choking.  She does take her pills with  applesauce premorbidly - daughter [name] via speaker phone stated  this was only due to pt consuming a full cup of  water with pills  and complaining of being full - therefore [name] recommended pt  take pills with applesauce.  CT Chest 11/2 showed "Evidence of  emphysema with traction bronchiectasis in multiple areas of  interstitial fibrosis. This appearance is consistent with  underlying usual interstitial pneumonitis. There are areas of   patchy consolidation in both lower lobes as well as to a lesser  extent in the upper lobes. There also moderate pleural effusions.  The areas of consolidation are felt to most likely represent  superimposed pneumonia. There may be a degree of superimposed  congestive heart failure as well given the pleural effusions. 5  mm nodular opacity in the posterior segment of the right upper  lobe.  Areas of atherosclerotic change and cardiac valvular  calcification." Type of Study: Modified Barium Swallowing Study Reason for Referral: Objectively evaluate swallowing function  (rule out overt aspiration) Diet Prior to this Study: Thin liquids Temperature Spikes Noted: Yes Respiratory Status: Room air History of Recent Intubation: No Behavior/Cognition: Alert;Cooperative;Pleasant mood Oral Cavity - Dentition: Dentures, top (lower partial) Oral Motor / Sensory Function: Impaired - see Bedside swallow  eval Self-Feeding Abilities: Able to feed self;Needs set up Patient Positioning: Upright in chair Baseline Vocal Quality: Clear [name] reports voice will change  intermittently  - likely due to excessive coughing) Volitional Cough: Strong (productive) Volitional Swallow: Able to elicit Anatomy: Within functional limits Pharyngeal Secretions: Not observed secondary MBS    Reason for Referral Objectively evaluate swallowing function  (rule out overt aspiration)   Oral Phase Oral Preparation/Oral Phase Oral Phase: Impaired Oral - Honey Oral - Honey Teaspoon: Reduced posterior  propulsion;Lingual/palatal residue;Weak lingual manipulation Oral - Nectar Oral - Nectar Teaspoon: Reduced posterior  propulsion;Lingual/palatal residue;Weak lingual manipulation Oral - Thin Oral - Thin Teaspoon: Reduced posterior propulsion;Weak lingual  manipulation Oral - Thin Cup: Reduced posterior propulsion;Lingual/palatal  residue;Weak lingual manipulation Oral - Solids Oral - Puree: Reduced posterior propulsion;Lingual/palatal  residue;Weak lingual  manipulation Oral - Regular: Reduced posterior propulsion;Weak lingual  manipulation Oral Phase - Comment Oral Phase - Comment: premature spillage of barium into pharynx  with decreased control   Pharyngeal Phase Pharyngeal Phase Pharyngeal Phase: Impaired Pharyngeal - Honey Pharyngeal - Honey Teaspoon: Premature spillage to  valleculae;Reduced anterior laryngeal mobility;Reduced laryngeal  elevation;Reduced airway/laryngeal closure;Reduced tongue base  retraction;Reduced epiglottic inversion;Reduced pharyngeal  peristalsis;Penetration/Aspiration before swallow;Pharyngeal  residue - valleculae;Pharyngeal residue - cp segment Penetration/Aspiration details (honey teaspoon): Material enters  airway, CONTACTS cords and not ejected out Pharyngeal - Nectar Pharyngeal - Nectar Teaspoon: Premature spillage to  valleculae;Reduced anterior laryngeal mobility;Reduced epiglottic  inversion;Reduced airway/laryngeal closure;Reduced laryngeal  elevation;Reduced tongue base retraction;Reduced pharyngeal  peristalsis;Trace aspiration;Penetration/Aspiration before  swallow;Penetration/Aspiration during swallow;Pharyngeal residue  - cp segment Penetration/Aspiration details (nectar teaspoon): Material enters  airway, passes BELOW cords without attempt by patient to eject  out (silent aspiration) Pharyngeal - Thin Pharyngeal - Thin Teaspoon: Reduced epiglottic inversion;Reduced  laryngeal elevation;Reduced airway/laryngeal closure;Reduced  anterior laryngeal mobility;Reduced pharyngeal  peristalsis;Pharyngeal residue - cp segment Pharyngeal - Thin Cup: Reduced anterior laryngeal  mobility;Reduced epiglottic inversion;Premature spillage to  pyriform sinuses;Reduced laryngeal elevation;Reduced  airway/laryngeal closure;Reduced pharyngeal peristalsis;Trace  aspiration;Penetration/Aspiration before swallow;Pharyngeal  residue - valleculae;Pharyngeal residue - cp segment Penetration/Aspiration details (thin cup): Material enters  airway,  CONTACTS cords and not ejected out;Material enters  airway, passes BELOW cords without attempt by patient to eject  out (silent aspiration) Pharyngeal - Solids Pharyngeal - Puree: Reduced pharyngeal peristalsis;Reduced  airway/laryngeal closure;Reduced epiglottic inversion;Reduced  tongue base retraction;Reduced laryngeal elevation;Premature  spillage  to valleculae;Penetration/Aspiration before  swallow;Pharyngeal residue - cp segment Penetration/Aspiration details (puree): Material enters airway,  remains ABOVE vocal cords then ejected out Pharyngeal - Regular: Reduced tongue base retraction;Reduced  epiglottic inversion;Reduced anterior laryngeal  mobility;Premature spillage to valleculae;Reduced laryngeal  elevation;Reduced airway/laryngeal closure;Reduced pharyngeal  peristalsis;Pharyngeal residue - valleculae;Pharyngeal residue -  cp segment Pharyngeal Phase - Comment Pharyngeal Comment: pt without awareness to pharyngeal residuals  mixing with barium/secretions, cued dry swallows marginally  effective to decrease residuals, pt able to expectorate to clear  but caused significant fatigue for pt after just a few boluses,  chin tuck posture not effective (pt unable to fully conduct due  to neck discomfort)   Cervical Esophageal Phase    GO    Cervical Esophageal Phase Cervical Esophageal Phase: Impaired Cervical Esophageal Phase - Honey Honey Teaspoon: Reduced cricopharyngeal relaxation Cervical Esophageal Phase - Nectar Nectar Teaspoon: Reduced cricopharyngeal relaxation Cervical Esophageal Phase - Thin Thin Teaspoon: Reduced cricopharyngeal relaxation Thin Cup: Reduced cricopharyngeal relaxation Cervical Esophageal Phase - Solids Puree: Reduced cricopharyngeal relaxation Regular: Reduced cricopharyngeal relaxation Cervical Esophageal Phase - Comment Cervical Esophageal Comment: appearance of barium residuals in  mid-esophagus, cued dry swallow nor liquid swallows effective to  clear, radiologist not present to  confirm and pt could not  undergo esophagram due to aspiration         Luanna Salk, MS [facility] SLP [phone]     Assessment/Plan: 1)  Diarrhea - resolved.  Very unlikely to be parasitic; results from wellness center never faxed over.    2) Usual organizing interstitial pneumonitis  - noted on CT.    Interstitial fibrosis.  Does not appear c/w HCAP and off antibiotics for that.  Will defer work up and discussion to pulmonary.   3) possible aspiration pneumonia - on clindamycin and can continue for 3-5 days.    I will sign off, please call with questions  Scharlene Gloss, [facility] for Infectious Disease [place] [email or web address] [phone] pager   [phone] cell 07/31/2014, 3:10 PM

## 2014-07-31 NOTE — Progress Notes (Signed)
Barium swallow completed today indicating aspiration. Patient and both daughters were educated multiples times this afternoon on risks of aspiration and the need to keep her NPO. Patient and family educated by Janace Hoard, RN and Ebony Hail, RN in addition to [employer] and Dr. Tyrell Antonio. Family informed that the NPO order would stand though the night until SLP and Dr. Tyrell Antonio can reevaluate and discuss future nourishment options in the AM. Family is continually insisting that patient have something to eat tonight and that they do not want to wait until the AM.

## 2014-07-31 NOTE — Progress Notes (Addendum)
Pt is using a venturi mask at night due to mouth breathing only. Oxygen is not required while awake.

## 2014-07-31 NOTE — Procedures (Signed)
Objective Swallowing Evaluation: Modified Barium Swallowing Study  Patient Details  Name: Martha Mcbride MRN: [id number] Date of Birth: [birth year]  Today's Date: 07/31/2014 Time: 1245-1315 SLP Time Calculation (min): 30 min  Past Medical History:  Past Medical History  Diagnosis Date  . Arthritis   . Polymyalgia   . Acid reflux   . Immune deficiency disorder     autoimmune disorder - polymyalgia - on prednisone  . Breast cancer     Treated with lumpectomy and radiation  . Giardia   . Pinworms    Past Surgical History:  Past Surgical History  Procedure Laterality Date  . Breast lumpectomy    . Partial hysterectomy    . Tonsillectomy    . Tee without cardioversion N/A 07/20/2014    Procedure: TRANSESOPHAGEAL ECHOCARDIOGRAM (TEE);  Surgeon: Candee Furbish, MD;  Location: [facility] ENDOSCOPY;  Service: Cardiovascular;  Laterality: N/A;   HPI:  80 y/o female with polymyalgia on chronic prednisone (5mg ) and leflunomide admitted on 10/31 to [place] with acute hypoxemic respiratory failure and diffuse bilateral infiltrates per MD note.  Pt also with recent Eosinophilia, CHF, acid reflux, parasitic diagnosis - giardia, pinworm, roundworms.  Order for swallow evaluation received and daughter [name] questioned if pt may dysphagia.  Pt denies dysphagia but admits to "memory problem" and occasional choking.  She does take her pills with applesauce premorbidly - daughter [name] via speaker phone stated this was only due to pt consuming a full cup of water with pills and complaining of being full - therefore [name] recommended pt take pills with applesauce.  CT Chest 11/2 showed "Evidence of emphysema with traction bronchiectasis in multiple areas of interstitial fibrosis. This appearance is consistent with underlying usual interstitial pneumonitis. There are areas of patchy consolidation in both lower lobes as well as to a lesser extent in the upper lobes. There also moderate pleural effusions. The areas of consolidation  are felt to most likely represent superimposed pneumonia. There may be a degree of superimposed congestive heart failure as well given the pleural effusions. 5 mm nodular opacity in the posterior segment of the right upper lobe.  Areas of atherosclerotic change and cardiac valvular calcification."     Assessment / Plan / Recommendation Clinical Impression  Dysphagia Diagnosis: Moderate oral phase dysphagia;Moderate pharyngeal phase dysphagia;Moderate cervical esophageal phase dysphagia  Clinical impression: Moderate oropharyngeal and suspected cervical esophageal dysphagia with + aspiration or penetration with each consistency tested.  Sensorimotor deficits noted impacting swallow ability.   Premature spillage of boluses into pharynx noted d/t weakness and compromised laryngeal elevation resulted in + aspiration of nectar *mixing with secretions* before the swallow.  She senses penetration/aspiration intermittently and can expectorate but was noted to be excessively fatigued and mildly dyspenic after just a few tsps of intake.  Further boluses were re-penetrated or aspirated.  Moderate vallecular residuals also noted with pudding and cracker more than liquids WITHOUT pt awareness.  Pt also is aspirating secretions that mix with barium.     Pt is at high aspiration risk currently and recommend she be NPO except small single ice chips after oral care.  ? If aspiration could be contributing to pt's fevers, pneumonia.  Daughter [name] present during testing and both pt/daughter educated to findings.    Further informed [name] that feeding tubes do not prevent aspiration.  SLP uncertain of pt's swallow prognosis given ? source of oropharyngeal dysphagia and pt's deconditioning and ongoing illness x2 months per [name]. Will follow up to help with care plan  for to determine readiness for repeat evaluation if indicated.     Treatment Recommendation  Therapy as outlined in treatment plan below    Diet  Recommendation NPO;Ice chips PRN after oral care (single small ice chips after oral care pending plans)        Other  Recommendations Oral Care Recommendations: Oral care Q4 per protocol   Follow Up Recommendations    TBD   Frequency and Duration min 1 x/week  1 week   Pertinent Vitals/Pain Febrile, congested     General Date of Onset: 07/30/14 HPI: 80 y/o female with polymyalgia on chronic prednisone (5mg ) and leflunomide admitted on 10/31 to [facility] with acute hypoxemic respiratory failure and diffuse bilateral infiltrates per MD note.  Pt also with recent Eosinophilia, CHF, acid reflux, parasitic diagnosis - giardia, pinworm, roundworms.  Order for swallow evaluation received and daughter [name] questioned if pt may dysphagia.  Pt denies dysphagia but admits to "memory problem" and occasional choking.  She does take her pills with applesauce premorbidly - daughter [name] via speaker phone stated this was only due to pt consuming a full cup of water with pills and complaining of being full - therefore [name] recommended pt take pills with applesauce.  CT Chest 11/2 showed "Evidence of emphysema with traction bronchiectasis in multiple areas of interstitial fibrosis. This appearance is consistent with underlying usual interstitial pneumonitis. There are areas of patchy consolidation in both lower lobes as well as to a lesser extent in the upper lobes. There also moderate pleural effusions. The areas of consolidation are felt to most likely represent superimposed pneumonia. There may be a degree of superimposed congestive heart failure as well given the pleural effusions. 5 mm nodular opacity in the posterior segment of the right upper lobe.  Areas of atherosclerotic change and cardiac valvular calcification." Type of Study: Modified Barium Swallowing Study Reason for Referral: Objectively evaluate swallowing function (rule out overt aspiration) Diet Prior to this Study: Thin liquids Temperature Spikes  Noted: Yes Respiratory Status: Room air History of Recent Intubation: No Behavior/Cognition: Alert;Cooperative;Pleasant mood Oral Cavity - Dentition: Dentures, top (lower partial) Oral Motor / Sensory Function: Impaired - see Bedside swallow eval Self-Feeding Abilities: Able to feed self;Needs set up Patient Positioning: Upright in chair Baseline Vocal Quality: Clear [name] reports voice will change intermittently  - likely due to excessive coughing) Volitional Cough: Strong (productive) Volitional Swallow: Able to elicit Anatomy: Within functional limits Pharyngeal Secretions: Not observed secondary MBS    Reason for Referral Objectively evaluate swallowing function (rule out overt aspiration)   Oral Phase Oral Preparation/Oral Phase Oral Phase: Impaired Oral - Honey Oral - Honey Teaspoon: Reduced posterior propulsion;Lingual/palatal residue;Weak lingual manipulation Oral - Nectar Oral - Nectar Teaspoon: Reduced posterior propulsion;Lingual/palatal residue;Weak lingual manipulation Oral - Thin Oral - Thin Teaspoon: Reduced posterior propulsion;Weak lingual manipulation Oral - Thin Cup: Reduced posterior propulsion;Lingual/palatal residue;Weak lingual manipulation Oral - Solids Oral - Puree: Reduced posterior propulsion;Lingual/palatal residue;Weak lingual manipulation Oral - Regular: Reduced posterior propulsion;Weak lingual manipulation Oral Phase - Comment Oral Phase - Comment: premature spillage of barium into pharynx with decreased control   Pharyngeal Phase Pharyngeal Phase Pharyngeal Phase: Impaired Pharyngeal - Honey Pharyngeal - Honey Teaspoon: Premature spillage to valleculae;Reduced anterior laryngeal mobility;Reduced laryngeal elevation;Reduced airway/laryngeal closure;Reduced tongue base retraction;Reduced epiglottic inversion;Reduced pharyngeal peristalsis;Penetration/Aspiration before swallow;Pharyngeal residue - valleculae;Pharyngeal residue - cp  segment Penetration/Aspiration details (honey teaspoon): Material enters airway, CONTACTS cords and not ejected out Pharyngeal - Nectar Pharyngeal - Nectar Teaspoon: Premature spillage to valleculae;Reduced  anterior laryngeal mobility;Reduced epiglottic inversion;Reduced airway/laryngeal closure;Reduced laryngeal elevation;Reduced tongue base retraction;Reduced pharyngeal peristalsis;Trace aspiration;Penetration/Aspiration before swallow;Penetration/Aspiration during swallow;Pharyngeal residue - cp segment Penetration/Aspiration details (nectar teaspoon): Material enters airway, passes BELOW cords without attempt by patient to eject out (silent aspiration) Pharyngeal - Thin Pharyngeal - Thin Teaspoon: Reduced epiglottic inversion;Reduced laryngeal elevation;Reduced airway/laryngeal closure;Reduced anterior laryngeal mobility;Reduced pharyngeal peristalsis;Pharyngeal residue - cp segment Pharyngeal - Thin Cup: Reduced anterior laryngeal mobility;Reduced epiglottic inversion;Premature spillage to pyriform sinuses;Reduced laryngeal elevation;Reduced airway/laryngeal closure;Reduced pharyngeal peristalsis;Trace aspiration;Penetration/Aspiration before swallow;Pharyngeal residue - valleculae;Pharyngeal residue - cp segment Penetration/Aspiration details (thin cup): Material enters airway, CONTACTS cords and not ejected out;Material enters airway, passes BELOW cords without attempt by patient to eject out (silent aspiration) Pharyngeal - Solids Pharyngeal - Puree: Reduced pharyngeal peristalsis;Reduced airway/laryngeal closure;Reduced epiglottic inversion;Reduced tongue base retraction;Reduced laryngeal elevation;Premature spillage to valleculae;Penetration/Aspiration before swallow;Pharyngeal residue - cp segment Penetration/Aspiration details (puree): Material enters airway, remains ABOVE vocal cords then ejected out Pharyngeal - Regular: Reduced tongue base retraction;Reduced epiglottic inversion;Reduced  anterior laryngeal mobility;Premature spillage to valleculae;Reduced laryngeal elevation;Reduced airway/laryngeal closure;Reduced pharyngeal peristalsis;Pharyngeal residue - valleculae;Pharyngeal residue - cp segment Pharyngeal Phase - Comment Pharyngeal Comment: pt without awareness to pharyngeal residuals mixing with barium/secretions, cued dry swallows marginally effective to decrease residuals, pt able to expectorate to clear but caused significant fatigue for pt after just a few boluses, chin tuck posture not effective (pt unable to fully conduct due to neck discomfort)   Cervical Esophageal Phase    GO    Cervical Esophageal Phase Cervical Esophageal Phase: Impaired Cervical Esophageal Phase - Honey Honey Teaspoon: Reduced cricopharyngeal relaxation Cervical Esophageal Phase - Nectar Nectar Teaspoon: Reduced cricopharyngeal relaxation Cervical Esophageal Phase - Thin Thin Teaspoon: Reduced cricopharyngeal relaxation Thin Cup: Reduced cricopharyngeal relaxation Cervical Esophageal Phase - Solids Puree: Reduced cricopharyngeal relaxation Regular: Reduced cricopharyngeal relaxation Cervical Esophageal Phase - Comment Cervical Esophageal Comment: appearance of barium residuals in mid-esophagus, cued dry swallow nor liquid swallows effective to clear, radiologist not present to confirm and pt could not undergo esophagram due to aspiration         Luanna Salk, [facility] SLP [phone]

## 2014-07-31 NOTE — Progress Notes (Signed)
After continued discussion on Diet and patient's risks of aspirating and lethargy family have now agreed to withhold on nourishment at this time and are willing to re explore this topic in the AM.

## 2014-07-31 NOTE — Progress Notes (Signed)
SLP received page from pt's daughter [name] this am regarding indication for pt to eat.  Advised her to speak to RN - ? If pt to have procedure today requiring her to be NPO.  If not NPO, recommend very small amounts with strict precautions- ceasing intake if pt coughing.  Advised that this SLP awaits order for MBS from MD if MD agrees.    Luanna Salk, [place] [facility] SLP [phone]

## 2014-08-01 DIAGNOSIS — E440 Moderate protein-calorie malnutrition: Secondary | ICD-10-CM

## 2014-08-01 DIAGNOSIS — J84112 Idiopathic pulmonary fibrosis: Secondary | ICD-10-CM

## 2014-08-01 LAB — CBC
HCT: 37.7 % (ref 36.0–46.0)
Hemoglobin: 12.5 g/dL (ref 12.0–15.0)
MCH: 29.1 pg (ref 26.0–34.0)
MCHC: 33.2 g/dL (ref 30.0–36.0)
MCV: 87.7 fL (ref 78.0–100.0)
PLATELETS: 167 10*3/uL (ref 150–400)
RBC: 4.3 MIL/uL (ref 3.87–5.11)
RDW: 18.3 % — ABNORMAL HIGH (ref 11.5–15.5)
WBC: 5.9 10*3/uL (ref 4.0–10.5)

## 2014-08-01 LAB — BASIC METABOLIC PANEL WITH GFR
Anion gap: 15 (ref 5–15)
BUN: 15 mg/dL (ref 6–23)
CO2: 25 meq/L (ref 19–32)
Calcium: 8.5 mg/dL (ref 8.4–10.5)
Chloride: 96 meq/L (ref 96–112)
Creatinine, Ser: 0.62 mg/dL (ref 0.50–1.10)
GFR calc Af Amer: >90 mL/min
GFR calc non Af Amer: 79 mL/min — ABNORMAL LOW
Glucose, Bld: 178 mg/dL — ABNORMAL HIGH (ref 70–99)
Potassium: 4.2 meq/L (ref 3.7–5.3)
Sodium: 136 meq/L — ABNORMAL LOW (ref 137–147)

## 2014-08-01 LAB — STRONGYLOIDES ANTIBODY: STRONGYLOIDES AB: NEGATIVE

## 2014-08-01 LAB — GLUCOSE, CAPILLARY: Glucose-Capillary: 171 mg/dL — ABNORMAL HIGH (ref 70–99)

## 2014-08-01 MED ORDER — ASPIRIN 300 MG RE SUPP
150.0000 mg | Freq: Every day | RECTAL | Status: DC
  Administered 2014-08-04 – 2014-08-06 (×3): 150 mg via RECTAL
  Filled 2014-08-01 (×7): qty 1

## 2014-08-01 MED ORDER — METHYLPREDNISOLONE SODIUM SUCC 40 MG IJ SOLR
20.0000 mg | Freq: Every day | INTRAMUSCULAR | Status: DC
  Administered 2014-08-02 – 2014-08-03 (×2): 20 mg via INTRAVENOUS
  Filled 2014-08-01: qty 1
  Filled 2014-08-01: qty 0.5
  Filled 2014-08-01: qty 1

## 2014-08-01 NOTE — Progress Notes (Signed)
[facility] PROGRESS NOTE  YARIELIS FUNARO FOY:[id number] DOB: Mar 02, 1925 DOA: 07/28/2014 PCP: Simona Huh, MD  Assessment/Plan: -Sepsis: Patient present with fever, BP in the 100 to 90 range, Lactic acid at 3.5. Chest x ray with persistent infiltrates. WBC at 12.  -Vancomycin and cefepime discontinue. Unlikely Health care associated per ID.  -Treating for Aspiration PNA.  -Blood culture no growth to date. .  -UA with trace leukocytes.  urine culture Staph coagulase negative.  -Lactic acid decrease to 2.4---from 3.5.   -Acute Hypoxic Respiratory Failure. Pneumonitis ? Aspiration PNA.  Chest x ray 11-01 showed worsening infiltrates: pulmonary edema, Pneumonitis.  CCM consulted for further evaluation of pneumonitis.   Received cefepime for 3 days. Started on Clindamycin 11-03 to cover for aspiration PNA.  Continue with treatment of PNA.  Patient will benefit form home oxygen, specially at night.  Sputum culture normal oro pharyngeal flora.  I discussed with Dr Linus Salmons, ok to use steroids.  Discussed with Dr Unknown Jim no need for inpatient bronchoscopy. Family can follow up out patient if they whish.  Discussed with Dr Elsworth Soho no need for inpatient bronchoscopy, ok to use steroid, need prednisone taper dose.  -Dysphagia, oropharyngeal, component of cervical esophageal;  Discussed wit family, options tube feeding, and repeat swallow evaluation, vs comfort diet but they need to understand risk for aspiration and worsening infection with diet.    History of giardia, Ascaris Lumbricoids: didn't finish treatment. 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 \ ID consulted for treatment recommendation and further evaluation. Of note patient had stool culture negative for parasites last admission.  -Ova and parasite stool negative.  -strongyloids antibody pending.  -Quantiferon Tb negative -Records pending.  -Per ID unlikely parasitic infection.   -Chronic Diastolic HF; Appears  compensated.   BNP at 576, last admission at 1467. Patient appears compensated.  Chest x ray with worsening infiltrates, edema, pneumonitis.  Will hold IV lasix today due to NPO status.  Stable. Negative 5 L.   -Polymyalgia Rheumatica:  On prednisone 5 mg daily. Change to IV due to NPO status.  leflunomide was discontinue during last admission.   -Gangrenous toes on the right foot  -Unclear etiology at this time, pulses on the right foot palpable with good perfusion  -Follow up with vascular outpatient.  -stable.  -wound care following. Local care.   -Decubitus ulcer  Wound care consulted. Appreciate recommendation.   -Malnutrition; awaiting family decision.   Appreciate consultant help.   Code Status: Full Code.  Family Communication: Care discussed with Daughters.  Disposition Plan: Remain in the step down unit.    Consultants:  Pulmonary  ID.   Procedures:  none  Antibiotics:  Vancomycin 10-31---11-02  Cefepime 10-31----11-03  Clindamycin 11-03  HPI/Subjective: Patient is alert, sitting in the chair. Denies chest pain and dyspnea.    Objective: Filed Vitals:   08/01/14 0517  BP: 160/82  Pulse: 80  Temp:   Resp: 15    Intake/Output Summary (Last 24 hours) at 08/01/14 0801 Last data filed at 07/31/14 2000  Gross per 24 hour  Intake    103 ml  Output   1525 ml  Net  -1422 ml   Filed Weights   07/29/14 0200 07/31/14 0400 08/01/14 0500  Weight: 66.3 kg (146 lb 2.6 oz) 63.2 kg (139 lb 5.3 oz) 63.2 kg (139 lb 5.3 oz)    Exam:   General:  No distress.   Cardiovascular: S 1, S 2 RRR  Respiratory:Bilateral crackles.   Abdomen: BS  present, soft. Nt  Musculoskeletal: no edema.   Data Reviewed: Basic Metabolic Panel:  Recent Labs Lab 07/28/14 1225 07/29/14 0330 07/30/14 0805 07/31/14 0340 08/01/14 0310  NA 130* 131* 133* 130* 136*  K 4.0 3.3* 4.1 4.3 4.2  CL 90* 94* 95* 94* 96  CO2 21 25 25 24 25   GLUCOSE 206* 162* 108*  109* 178*  BUN 17 12 13 12 15   CREATININE 0.70 0.55 0.66 0.67 0.62  CALCIUM 8.5 8.2* 8.5 8.6 8.5   Liver Function Tests:  Recent Labs Lab 07/28/14 1225  AST 18  ALT 24  ALKPHOS 119*  BILITOT 0.8  PROT 6.5  ALBUMIN 2.8*   No results for input(s): LIPASE, AMYLASE in the last 168 hours. No results for input(s): AMMONIA in the last 168 hours. CBC:  Recent Labs Lab 07/28/14 1225 07/29/14 0330 07/30/14 0805 07/31/14 0340 08/01/14 0310  WBC 12.5* 8.5 9.7 9.3 5.9  NEUTROABS 8.4*  --   --   --   --   HGB 12.7 11.2* 12.6 12.5 12.5  HCT 37.7 32.4* 37.3 37.5 37.7  MCV 87.5 85.9 86.5 86.6 87.7  PLT 232 208 212 188 167   Cardiac Enzymes:  Recent Labs Lab 07/28/14 1225 07/30/14 1055  TROPONINI <0.30 <0.30   BNP (last 3 results)  Recent Labs  07/19/14 2039 07/28/14 1225 07/29/14 0330  PROBNP 1467.0* 576.5* 753.4*   CBG:  Recent Labs Lab 07/30/14 0520 07/31/14 1757 08/01/14 0227  GLUCAP 114* 133* 171*    Recent Results (from the past 240 hour(s))  Blood culture (routine x 2)     Status: None (Preliminary result)   Collection Time: 07/28/14 12:21 PM  Result Value Ref Range Status   Specimen Description BLOOD LEFT WRIST  4 ML IN [facility] BOTTLE  Final   Special Requests NONE  Final   Culture  Setup Time   Final    07/28/2014 20:11 Performed at [facility]    Culture   Final           BLOOD CULTURE RECEIVED NO GROWTH TO DATE CULTURE WILL BE HELD FOR 5 DAYS BEFORE ISSUING A FINAL NEGATIVE REPORT Performed at [facility]    Report Status PENDING  Incomplete  Blood culture (routine x 2)     Status: None (Preliminary result)   Collection Time: 07/28/14 12:49 PM  Result Value Ref Range Status   Specimen Description BLOOD BLOOD LEFT FOREARM  Final   Special Requests BOTTLES DRAWN AEROBIC AND ANAEROBIC 5ML  Final   Culture  Setup Time   Final    07/28/2014 20:10 Performed at [facility]    Culture   Final           BLOOD CULTURE  RECEIVED NO GROWTH TO DATE CULTURE WILL BE HELD FOR 5 DAYS BEFORE ISSUING A FINAL NEGATIVE REPORT Performed at [facility]    Report Status PENDING  Incomplete  Urine culture     Status: None   Collection Time: 07/28/14  2:03 PM  Result Value Ref Range Status   Specimen Description URINE, CLEAN CATCH  Final   Special Requests NONE  Final   Culture  Setup Time   Final    07/29/2014 02:28 Performed at [place]   Final    30,000 COLONIES/ML Performed at [facility]    Culture   Final    STAPHYLOCOCCUS SPECIES (COAGULASE NEGATIVE) Note: RIFAMPIN AND GENTAMICIN SHOULD NOT  BE USED AS SINGLE DRUGS FOR TREATMENT OF STAPH INFECTIONS. Performed at [facility]    Report Status 07/31/2014 FINAL  Final   Organism ID, Bacteria STAPHYLOCOCCUS SPECIES (COAGULASE NEGATIVE)  Final      Susceptibility   Staphylococcus species (coagulase negative) - MIC*    GENTAMICIN <=0.5 SENSITIVE Sensitive     LEVOFLOXACIN >=8 RESISTANT Resistant     NITROFURANTOIN <=16 SENSITIVE Sensitive     OXACILLIN >=4 RESISTANT Resistant     PENICILLIN >=0.5 RESISTANT Resistant     RIFAMPIN <=0.5 SENSITIVE Sensitive     TRIMETH/SULFA <=10 SENSITIVE Sensitive     VANCOMYCIN 1 SENSITIVE Sensitive     TETRACYCLINE <=1 SENSITIVE Sensitive     * STAPHYLOCOCCUS SPECIES (COAGULASE NEGATIVE)  MRSA PCR Screening     Status: None   Collection Time: 07/28/14  3:52 PM  Result Value Ref Range Status   MRSA by PCR NEGATIVE NEGATIVE Final    Comment:        The GeneXpert MRSA Assay (FDA approved for NASAL specimens only), is one component of a comprehensive MRSA colonization surveillance program. It is not intended to diagnose MRSA infection nor to guide or monitor treatment for MRSA infections.  Culture, expectorated sputum-assessment     Status: None   Collection Time: 07/28/14  4:21 PM  Result Value Ref Range Status   Specimen Description SPUTUM  Final   Special  Requests Immunocompromised  Final   Sputum evaluation   Final    THIS SPECIMEN IS ACCEPTABLE. RESPIRATORY CULTURE REPORT TO FOLLOW.   Report Status 07/28/2014 FINAL  Final  Culture, respiratory (NON-Expectorated)     Status: None   Collection Time: 07/28/14  4:21 PM  Result Value Ref Range Status   Specimen Description SPUTUM  Final   Special Requests NONE  Final   Gram Stain   Final    NO WBC SEEN FEW SQUAMOUS EPITHELIAL CELLS PRESENT MODERATE GRAM POSITIVE RODS FEW GRAM NEGATIVE RODS FEW GRAM POSITIVE COCCI IN PAIRS    Culture   Final    NORMAL OROPHARYNGEAL FLORA Performed at [facility]    Report Status 07/31/2014 FINAL  Final  Ova and parasite examination     Status: None   Collection Time: 07/29/14  7:28 PM  Result Value Ref Range Status   Specimen Description STOOL  Final   Special Requests NONE  Final   Ova and parasites   Final    NO OVA OR PARASITES SEEN NO STRONGYLOIDES STERCORALIS SEEN Performed at [facility]    Report Status 07/31/2014 FINAL  Final     Studies: Ct Chest Wo Contrast  07/30/2014   CLINICAL DATA:  Fever and elevated white blood cell count. Chronic shortness of breath and chest pain. History of breast carcinoma in 2000  EXAM: CT CHEST WITHOUT CONTRAST  TECHNIQUE: Multidetector CT imaging of the chest was performed following the standard protocol without IV contrast material administration.  COMPARISON:  Chest radiograph August 08, 2014  FINDINGS: There is underlying emphysematous change with interstitial fibrosis and honeycombing throughout both upper lobes, primarily medially as well as in the lingula, right middle lobe, and both lower lobes. There is diffuse traction type bronchiectasis.  There are moderate free-flowing effusions bilaterally. There are areas of consolidation in both lower lobes in a somewhat patchy distribution. Lesser degrees of airspace consolidation are noted in both upper lobes.  On axial slice 32 series 5, there  is a 5 mm nodular opacity in the  posterior segment of the right upper lobe.  There is atherosclerotic change in the aorta but no aneurysm. No pulmonary embolus is seen on this noncontrast enhanced study. There is extensive calcification in the mitral valve and aortic valve regions.  There are multiple small lymph nodes throughout the mediastinum. There is a lymph node in the anterior precarinal region measuring 1.6 by 1.4 cm. A second lymph node in this area measures 2.0 x 1.2 cm. A lymph node in the aortopulmonary window region measures 1.5 x 1.3 cm.  There is a moderate hiatal hernia.  In the visualized upper abdominal region, there is atherosclerotic change in the aorta. There is cholelithiasis. There is a small cyst in the posterior segment of the right lobe of the liver.  There is degenerative change in the thoracic spine with increase in kyphosis. There are no blastic or lytic bone lesions. Thyroid appears unremarkable.  IMPRESSION: Evidence of emphysema with traction bronchiectasis in multiple areas of interstitial fibrosis. This appearance is consistent with underlying usual interstitial pneumonitis.  There are areas of patchy consolidation in both lower lobes as well as to a lesser extent in the upper lobes. There also moderate pleural effusions. The areas of consolidation are felt to most likely represent superimposed pneumonia. There may be a degree of superimposed congestive heart failure as well given the pleural effusions.  5 mm nodular opacity in the posterior segment of the right upper lobe. Followup of this nodular opacity should be based on Fleischner Society guidelines. If the patient is at high risk for bronchogenic carcinoma, follow-up chest CT at 6-12 months is recommended. If the patient is at low risk for bronchogenic carcinoma, follow-up chest CT at 12 months is recommended. This recommendation follows the consensus statement: Guidelines for Management of Small Pulmonary Nodules Detected on  CT Scans: A Statement from the [place] as published in Radiology 2005;237:395-400.  Areas of atherosclerotic change and cardiac valvular calcification.  Areas of relatively mild adenopathy.  Cholelithiasis.  Given the areas of questionable pneumonia, it may be prudent to consider a followup study in 2-3 weeks to assess for clearing.   Electronically Signed   By: Lowella Grip M.D.   On: 07/30/2014 11:39   Dg Swallowing Func-speech Pathology  07/31/2014   Macario Golds, CCC-SLP     07/31/2014  1:52 PM Objective Swallowing Evaluation: Modified Barium Swallowing Study   Patient Details  Name: RUCHEL [name] MRN: [id number] Date of Birth: [birth year]  Today's Date: 07/31/2014 Time: 1245-1315 SLP Time Calculation (min): 30 min  Past Medical History:  Past Medical History  Diagnosis Date  . Arthritis   . Polymyalgia   . Acid reflux   . Immune deficiency disorder     autoimmune disorder - polymyalgia - on prednisone  . Breast cancer     Treated with lumpectomy and radiation  . Giardia   . Pinworms    Past Surgical History:  Past Surgical History  Procedure Laterality Date  . Breast lumpectomy    . Partial hysterectomy    . Tonsillectomy    . Tee without cardioversion N/A 07/20/2014    Procedure: TRANSESOPHAGEAL ECHOCARDIOGRAM (TEE);  Surgeon: Candee Furbish, MD;  Location: [facility] ENDOSCOPY;  Service: Cardiovascular;   Laterality: N/A;   HPI:  80 y/o female with polymyalgia on chronic prednisone (5mg ) and  leflunomide admitted on 10/31 to [facility] with acute hypoxemic  respiratory failure and diffuse bilateral infiltrates per MD  note.  Pt also with recent Eosinophilia, CHF, acid  reflux,  parasitic diagnosis - giardia, pinworm, roundworms.  Order for  swallow evaluation received and daughter [name] questioned if pt  may dysphagia.  Pt denies dysphagia but admits to "memory  problem" and occasional choking.  She does take her pills with  applesauce premorbidly - daughter [name] via speaker phone stated  this was only due  to pt consuming a full cup of water with pills  and complaining of being full - therefore [name] recommended pt  take pills with applesauce.  CT Chest 11/2 showed "Evidence of  emphysema with traction bronchiectasis in multiple areas of  interstitial fibrosis. This appearance is consistent with  underlying usual interstitial pneumonitis. There are areas of  patchy consolidation in both lower lobes as well as to a lesser  extent in the upper lobes. There also moderate pleural effusions.  The areas of consolidation are felt to most likely represent  superimposed pneumonia. There may be a degree of superimposed  congestive heart failure as well given the pleural effusions. 5  mm nodular opacity in the posterior segment of the right upper  lobe.  Areas of atherosclerotic change and cardiac valvular  calcification."     Assessment / Plan / Recommendation Clinical Impression  Dysphagia Diagnosis: Moderate oral phase dysphagia;Moderate  pharyngeal phase dysphagia;Moderate cervical esophageal phase  dysphagia  Clinical impression: Moderate oropharyngeal and suspected  cervical esophageal dysphagia with + aspiration or penetration  with each consistency tested.  Sensorimotor deficits noted  impacting swallow ability.   Premature spillage of boluses into pharynx noted d/t weakness and  compromised laryngeal elevation resulted in + aspiration of  nectar *mixing with secretions* before the swallow.  She senses  penetration/aspiration intermittently and can expectorate but was  noted to be excessively fatigued and mildly dyspenic after just a  few tsps of intake.  Further boluses were re-penetrated or  aspirated.  Moderate vallecular residuals also noted with pudding  and cracker more than liquids WITHOUT pt awareness.  Pt also is  aspirating secretions that mix with barium.     Pt is at high aspiration risk currently and recommend she be NPO  except small single ice chips after oral care.  ? If aspiration  could be contributing  to pt's fevers, pneumonia.  Daughter [name]  present during testing and both pt/daughter educated to findings.     Further informed [name] that feeding tubes do not prevent  aspiration.  SLP uncertain of pt's swallow prognosis given ?  source of oropharyngeal dysphagia and pt's deconditioning and  ongoing illness x2 months per [name]. Will follow up to help with  care plan for to determine readiness for repeat evaluation if  indicated.     Treatment Recommendation  Therapy as outlined in treatment plan below    Diet Recommendation NPO;Ice chips PRN after oral care (single  small ice chips after oral care pending plans)        Other  Recommendations Oral Care Recommendations: Oral care Q4  per protocol   Follow Up Recommendations    TBD   Frequency and Duration min 1 x/week  1 week   Pertinent Vitals/Pain Febrile, congested     General Date of Onset: 07/30/14 HPI: 80 y/o female with polymyalgia on chronic prednisone (5mg )  and leflunomide admitted on 10/31 to [facility] with acute hypoxemic  respiratory failure and diffuse bilateral infiltrates per MD  note.  Pt also with recent Eosinophilia, CHF, acid reflux,  parasitic diagnosis - giardia, pinworm, roundworms.  Order for  swallow  evaluation received and daughter [name] questioned if pt  may dysphagia.  Pt denies dysphagia but admits to "memory  problem" and occasional choking.  She does take her pills with  applesauce premorbidly - daughter [name] via speaker phone stated  this was only due to pt consuming a full cup of water with pills  and complaining of being full - therefore [name] recommended pt  take pills with applesauce.  CT Chest 11/2 showed "Evidence of  emphysema with traction bronchiectasis in multiple areas of  interstitial fibrosis. This appearance is consistent with  underlying usual interstitial pneumonitis. There are areas of  patchy consolidation in both lower lobes as well as to a lesser  extent in the upper lobes. There also moderate pleural effusions.  The areas  of consolidation are felt to most likely represent  superimposed pneumonia. There may be a degree of superimposed  congestive heart failure as well given the pleural effusions. 5  mm nodular opacity in the posterior segment of the right upper  lobe.  Areas of atherosclerotic change and cardiac valvular  calcification." Type of Study: Modified Barium Swallowing Study Reason for Referral: Objectively evaluate swallowing function  (rule out overt aspiration) Diet Prior to this Study: Thin liquids Temperature Spikes Noted: Yes Respiratory Status: Room air History of Recent Intubation: No Behavior/Cognition: Alert;Cooperative;Pleasant mood Oral Cavity - Dentition: Dentures, top (lower partial) Oral Motor / Sensory Function: Impaired - see Bedside swallow  eval Self-Feeding Abilities: Able to feed self;Needs set up Patient Positioning: Upright in chair Baseline Vocal Quality: Clear [name] reports voice will change  intermittently  - likely due to excessive coughing) Volitional Cough: Strong (productive) Volitional Swallow: Able to elicit Anatomy: Within functional limits Pharyngeal Secretions: Not observed secondary MBS    Reason for Referral Objectively evaluate swallowing function  (rule out overt aspiration)   Oral Phase Oral Preparation/Oral Phase Oral Phase: Impaired Oral - Honey Oral - Honey Teaspoon: Reduced posterior  propulsion;Lingual/palatal residue;Weak lingual manipulation Oral - Nectar Oral - Nectar Teaspoon: Reduced posterior  propulsion;Lingual/palatal residue;Weak lingual manipulation Oral - Thin Oral - Thin Teaspoon: Reduced posterior propulsion;Weak lingual  manipulation Oral - Thin Cup: Reduced posterior propulsion;Lingual/palatal  residue;Weak lingual manipulation Oral - Solids Oral - Puree: Reduced posterior propulsion;Lingual/palatal  residue;Weak lingual manipulation Oral - Regular: Reduced posterior propulsion;Weak lingual  manipulation Oral Phase - Comment Oral Phase - Comment: premature spillage  of barium into pharynx  with decreased control   Pharyngeal Phase Pharyngeal Phase Pharyngeal Phase: Impaired Pharyngeal - Honey Pharyngeal - Honey Teaspoon: Premature spillage to  valleculae;Reduced anterior laryngeal mobility;Reduced laryngeal  elevation;Reduced airway/laryngeal closure;Reduced tongue base  retraction;Reduced epiglottic inversion;Reduced pharyngeal  peristalsis;Penetration/Aspiration before swallow;Pharyngeal  residue - valleculae;Pharyngeal residue - cp segment Penetration/Aspiration details (honey teaspoon): Material enters  airway, CONTACTS cords and not ejected out Pharyngeal - Nectar Pharyngeal - Nectar Teaspoon: Premature spillage to  valleculae;Reduced anterior laryngeal mobility;Reduced epiglottic  inversion;Reduced airway/laryngeal closure;Reduced laryngeal  elevation;Reduced tongue base retraction;Reduced pharyngeal  peristalsis;Trace aspiration;Penetration/Aspiration before  swallow;Penetration/Aspiration during swallow;Pharyngeal residue  - cp segment Penetration/Aspiration details (nectar teaspoon): Material enters  airway, passes BELOW cords without attempt by patient to eject  out (silent aspiration) Pharyngeal - Thin Pharyngeal - Thin Teaspoon: Reduced epiglottic inversion;Reduced  laryngeal elevation;Reduced airway/laryngeal closure;Reduced  anterior laryngeal mobility;Reduced pharyngeal  peristalsis;Pharyngeal residue - cp segment Pharyngeal - Thin Cup: Reduced anterior laryngeal  mobility;Reduced epiglottic inversion;Premature spillage to  pyriform sinuses;Reduced laryngeal elevation;Reduced  airway/laryngeal closure;Reduced pharyngeal peristalsis;Trace  aspiration;Penetration/Aspiration before swallow;Pharyngeal  residue - valleculae;Pharyngeal residue - cp segment  Penetration/Aspiration details (thin cup): Material enters  airway, CONTACTS cords and not ejected out;Material enters  airway, passes BELOW cords without attempt by patient to eject  out (silent aspiration)  Pharyngeal - Solids Pharyngeal - Puree: Reduced pharyngeal peristalsis;Reduced  airway/laryngeal closure;Reduced epiglottic inversion;Reduced  tongue base retraction;Reduced laryngeal elevation;Premature  spillage to valleculae;Penetration/Aspiration before  swallow;Pharyngeal residue - cp segment Penetration/Aspiration details (puree): Material enters airway,  remains ABOVE vocal cords then ejected out Pharyngeal - Regular: Reduced tongue base retraction;Reduced  epiglottic inversion;Reduced anterior laryngeal  mobility;Premature spillage to valleculae;Reduced laryngeal  elevation;Reduced airway/laryngeal closure;Reduced pharyngeal  peristalsis;Pharyngeal residue - valleculae;Pharyngeal residue -  cp segment Pharyngeal Phase - Comment Pharyngeal Comment: pt without awareness to pharyngeal residuals  mixing with barium/secretions, cued dry swallows marginally  effective to decrease residuals, pt able to expectorate to clear  but caused significant fatigue for pt after just a few boluses,  chin tuck posture not effective (pt unable to fully conduct due  to neck discomfort)   Cervical Esophageal Phase    GO    Cervical Esophageal Phase Cervical Esophageal Phase: Impaired Cervical Esophageal Phase - Honey Honey Teaspoon: Reduced cricopharyngeal relaxation Cervical Esophageal Phase - Nectar Nectar Teaspoon: Reduced cricopharyngeal relaxation Cervical Esophageal Phase - Thin Thin Teaspoon: Reduced cricopharyngeal relaxation Thin Cup: Reduced cricopharyngeal relaxation Cervical Esophageal Phase - Solids Puree: Reduced cricopharyngeal relaxation Regular: Reduced cricopharyngeal relaxation Cervical Esophageal Phase - Comment Cervical Esophageal Comment: appearance of barium residuals in  mid-esophagus, cued dry swallow nor liquid swallows effective to  clear, radiologist not present to confirm and pt could not  undergo esophagram due to aspiration         Luanna Salk, MS [facility] SLP [phone]     Scheduled Meds: .  antiseptic oral rinse  7 mL Mouth Rinse q12n4p  . aspirin  150 mg Rectal Daily  . butamben-tetracaine-benzocaine  1 spray Topical Once  . chlorhexidine  15 mL Mouth Rinse BID  . clindamycin (CLEOCIN) IV  600 mg Intravenous 3 times per day  . enoxaparin (LOVENOX) injection  40 mg Subcutaneous Q24H  . fluconazole (DIFLUCAN) IV  100 mg Intravenous Daily  . folic acid  1 mg Intravenous Daily  . lidocaine  1 application Topical Once  . methylPREDNISolone (SOLU-MEDROL) injection  40 mg Intravenous Daily  . metoprolol  5 mg Intravenous Q12H  . pantoprazole (PROTONIX) IV  40 mg Intravenous Q12H  . polyvinyl alcohol  1 drop Both Eyes Daily  . sodium chloride  3 mL Intravenous Q12H   Continuous Infusions:   Principal Problem:   Sepsis Active Problems:   Hyponatremia   Immune disorder   Fever   Diastolic HF (heart failure)   Chronic diastolic heart failure   Acute respiratory failure with hypoxemia   Dyspnea   Pyrexia   Hypoxemia   Pulmonary infiltrate   Malnutrition of moderate degree   Aspiration into airway    Time spent: 35 minutes.     Niel Hummer A  [facility] Pager [phone]. If 7PM-7AM, please contact night-coverage at [email or web address], password [facility] 08/01/2014, 8:01 AM  LOS: 4 days

## 2014-08-01 NOTE — Evaluation (Addendum)
Physical Therapy Evaluation Patient Details Name: Martha Mcbride MRN: [id number] DOB: 1924-10-17 Today's Date: 08/01/2014   History of Present Illness  80 yo female admitted with sepsis, hypoxia. Hx of weakness, polymyalgia rheumatica, breast cancer, R foot ischemic toes. Recent d/c from [place] on 10/26. Pt is from home with family.   Clinical Impression  On eval, pt required Min assist for mobility-able to ambulate ~25 feet with RW. Distance limited by fatigue, weakness. Remained on O2 during session-monitor not reading accurately so replaced O2 sensor at end of session-98% on 2L with new sensor. Educated pt and daughter on AP, QS, GS exercises, 10-20 repetitions, up to 3x/day if she can tolerate it. Discussed d/c plan with pt/daughter-plan is for home with HHPT. Pt will require at least Min assist and 24 hour supervision/assist. If family cannot provide this, will need to consider ST rehab at [facility].     Follow Up Recommendations Home health PT;Supervision/Assistance - 24 hour (as long as family can provide care. At this time, family prefers home. )    Clinical biochemist with 5" wheels    Recommendations for Other Services       Precautions / Restrictions Precautions Precautions: Fall Restrictions Weight Bearing Restrictions: No      Mobility  Bed Mobility               General bed mobility comments: pt sitting in recliner  Transfers Overall transfer level: Needs assistance Equipment used: Rolling walker (2 wheeled) Transfers: Sit to/from Stand Sit to Stand: Min assist         General transfer comment: Assist to rise, stabilize, control descent.   Ambulation/Gait Ambulation/Gait assistance: Min assist Ambulation Distance (Feet): 25 Feet Assistive device: Rolling walker (2 wheeled) Gait Pattern/deviations: Step-through pattern;Decreased stride length     General Gait Details: assist to stabilize pt an maneuver with RW. Fatigues easily Dyspnea  2/4.Remained on 2L O2 during session. difficulty getting accurate O2 reading-pt was not severely dyspneic with short distance.   Stairs            Wheelchair Mobility    Modified Rankin (Stroke Patients Only)       Balance Overall balance assessment: Needs assistance         Standing balance support: Bilateral upper extremity supported;During functional activity Standing balance-Leahy Scale: Poor                               Pertinent Vitals/Pain Pain Assessment: No/denies pain    Home Living Family/patient expects to be discharged to:: Private residence Living Arrangements: Alone Available Help at Discharge: Family Type of Home: House       Home Layout: One level Home Equipment: Environmental consultant - 2 wheels;Cane - single point      Prior Function Level of Independence: Needs assistance   Gait / Transfers Assistance Needed: needing Min assit for ambulation with walker on last visit.            Hand Dominance        Extremity/Trunk Assessment   Upper Extremity Assessment: Defer to OT evaluation           Lower Extremity Assessment: Generalized weakness      Cervical / Trunk Assessment: Kyphotic  Communication   Communication: No difficulties  Cognition Arousal/Alertness: Awake/alert Behavior During Therapy: WFL for tasks assessed/performed Overall Cognitive Status: Within Functional Limits for tasks assessed  General Comments      Exercises Total Joint Exercises Ankle Circles/Pumps: AROM;Both;5 reps;Seated Quad Sets: AROM;Both;5 reps;Seated Gluteal Sets: AROM;Both;5 reps;Seated      Assessment/Plan    PT Assessment Patient needs continued PT services  PT Diagnosis Difficulty walking;Generalized weakness   PT Problem List Decreased strength;Decreased activity tolerance;Decreased balance;Decreased mobility;Decreased knowledge of use of DME;Cardiopulmonary status limiting activity  PT Treatment  Interventions DME instruction;Gait training;Functional mobility training;Therapeutic activities;Therapeutic exercise;Patient/family education;Balance training   PT Goals (Current goals can be found in the Care Plan section) Acute Rehab PT Goals Patient Stated Goal: to get stronger. home.  PT Goal Formulation: With patient/family Time For Goal Achievement: 08/15/14 Potential to Achieve Goals: Fair    Frequency Min 3X/week   Barriers to discharge        Co-evaluation               End of Session Equipment Utilized During Treatment: Gait belt Activity Tolerance: Patient limited by fatigue Patient left: in chair;with call bell/phone within reach;with family/visitor present           Time: 7622-6333 PT Time Calculation (min): 18 min   Charges:   PT Evaluation $Initial PT Evaluation Tier I: 1 Procedure PT Treatments $Gait Training: 8-22 mins   PT G Codes:          Weston Anna, MPT Pager: [phone]

## 2014-08-01 NOTE — Care Management Note (Addendum)
Page 1 of 2   08/09/2014     1:53:59 PM CARE MANAGEMENT NOTE 08/09/2014  Patient:  Martha Mcbride, Martha Mcbride   Account Number:  [id number]  Date Initiated:  08/01/2014  Documentation initiated by:  DAVIS,RHONDA  Subjective/Objective Assessment:   patient resp distress and failure/endo does show swallowing defects and high risk of aspiration/requiring 02 via face mask at 40%     Action/Plan:   tbd/patient lives alone does have support her adult children and friends/not clear as to how close the children live to the patient   Anticipated DC Date:  08/09/2014   Anticipated DC Plan:  [place]  In-house referral  NA      [place]  CM consult      PAC Choice  NA   Choice offered to / List presented to:  NA   DME arranged  OXYGEN  TUBE FEEDING PUMP  SUCTION  WALKER - YOUTH  OTHER - SEE COMMENT      DME agency  [place] arranged  HH-1 RN  [place]      [place].   Status of service:  Completed, signed off Medicare Important Message given?  YES (If response is "NO", the following Medicare IM given date fields will be blank) Date Medicare IM given:  08/04/2014 Medicare IM given by:  [facility] Date Additional Medicare IM given:  08/08/2014 Additional Medicare IM given by:  Marney Doctor  Discharge Disposition:  [place]  Per UR Regulation:  Reviewed for med. necessity/level of care/duration of stay  If discussed at [place] of Stay Meetings, dates discussed:    Comments:  08/09/14 13:00 CM requested additional orders: HHSW, Enteral rate orders, and pulse oximeter ordres for [facility] agency reps to complete the [facility] services plan.  Orders wre placed [facility] contacted and confirmed pt is ready for discharge from their side. No other CM needs were communicated.  Mariane Masters, BSN, [place].  08/08/14 Marney Doctor RN,BSN,NCM [phone] Continuous 02 check was not done last night due to daughter refusing to allow TF to be turned off for test.  Pt has now transitioned to bolus tube feeds so Continuous 02 check will be done by RN tonight.  Staff RN Lynwood Dawley. to pass on to night RN to do 02 check.  Pts pediatric walker delivered to room.  Daughter is working on securing a new PCP for pt.  They are trying to obtain Dr. Felipa Eth as a PCP.  Final orders for bolus TF will need to be written for [facility] if current bolus tube feeding regimen continues to work for pt. [facility] HH and DME reps are following for any updates. CM will continue to follow.  08/07/14 Marney Doctor RN,BSN,NCM [phone] Spoke with pt and both daughters at bedside on 08/06/14. They had many questions about home health and DC planning. Pt with new peg tube and will receive tube feedings at home.  Trial of continuous tube feeding to get pt to goal today.  If goes well per dietitian pt will then be tried on bolus feedings tomorrow am and should be ready to DC tomorrow afternoon if tolerates bolus feedings well.  Both daughters encouraged to learn to do TF and staff RN Josph Macho is doing teaching with them today.  Per [facility] DME rep  staff RN will need to do continuous 02 check tonight to determine need for 02 at night at home.  Check must be done for at least 2 hours and pt must sat below 88% for 5 consecutive minutes on room air.  Start and stop time must be documented and time pt dropped below 88% documented as well.  Instruction given to [employer].  Final orders for DME and TF will be needed prior to DC (if TF pump and 02 needed).  CM will continue to follow and assist with DC needs.  08/04/2014 1650 NCM spoke to [facility] DME rep and he will follow up with dtr in am about DME delivery. Pt may need hospital bed at home. Jonnie Finner RN CCM Case Mgmt phone [phone]  08/04/2014 1000  NCM spoke to make aware of dc home with DME. States DME will be delivered at dc to home.  When they bring concentrator they will deliver suction. Notified [place] HH rep that pt will not dc home today. Jonnie Finner RN CCM Case Mgmt phone [phone]   [id number] Rosana Hoes, RN, BSN, CCM Chart reviewed. Discharge needs and patient's stay to be reviewed and followed by case manager.[place]

## 2014-08-01 NOTE — Progress Notes (Signed)
   08/01/14 1526  OT Visit Information  Last OT Received On 08/01/14  Assistance Needed +1  History of Present Illness 80 yo female admitted with sepsis, hypoxia. Hx of weakness, polymyalgia rheumatica, breast cancer, R foot ischemic toes. Recent d/c from [place] on 10/26. Pt is from home with family.   OT Time Calculation  OT Start Time 1420  OT Stop Time 5102  OT Time Calculation (min) 39 min  Precautions  Precautions Fall  Pain Assessment  Pain Assessment 0-10  Pt denies pain  Cognition  Arousal/Alertness Awake/alert  Behavior During Therapy WFL for tasks assessed/performed  Overall Cognitive Status Within Functional Limits for tasks assessed  ADL  Toilet Transfer Minimal assistance;Stand-pivot;BSC  Toileting- Clothing Manipulation and Hygiene Sit to/from stand;Total assistance  General ADL Comments Pt used restroom prior to getting back to bed.  Pt unable to assist with hygiene  Bed Mobility  Sit to supine Min assist  General bed mobility comments assist for LEs  Exercises  Exercises Other exercises  Other Exercises  Other Exercises provided handouts for theraband for shoulder flexion, extension, elbow flexion, and extension.  Daughter assisted on L side and OT assisted on R, holding theraband in appropriate position and having pt perform 2 sets of 5 exercises. Used level one band.   pt also squeezed ball x 10 reps in each hand.  Educated daughter on proper resistance with band, how to watch for too much resistance and also educated to give pt a rest break between each exercise.  Daughter would benefit from reinforcement with program.  educated that pt can perform any of these exercises actively if she is tired.  Goal is 2 sets of 5 per day  OT - End of Session  Activity Tolerance Patient tolerated treatment well  Patient left in bed;with call bell/phone within reach;with family/visitor present  OT Assessment/Plan  OT Frequency Min 2X/week  Follow Up Recommendations Home health  OT;Supervision/Assistance - 24 hour  OT Equipment None recommended by OT  OT Goal Progression  Progress towards OT goals Progressing toward goals  ADL Goals  Pt Will Perform Lower Body Bathing with min assist;sit to/from stand  Pt Will Perform Lower Body Dressing with min assist;sit to/from stand  Pt Will Transfer to Toilet with min guard assist;ambulating;bedside commode  Pt Will Perform Toileting - Clothing Manipulation and hygiene with min assist;sit to/from stand  Additional ADL Goal #1 Family will be independent in assisting pt with bil UE level one theraband program and AROM exercises  OT General Charges  $OT Visit 1 Procedure  OT Treatments  $Self Care/Home Management  8-22 mins  $Therapeutic Exercise 23-37 mins  Ronique Simerly, OTR/L [phone] 08/01/2014

## 2014-08-01 NOTE — Consult Note (Addendum)
[facility] Consult Note  Referring Provider: No ref. provider found Primary Care Physician:  Simona Huh, MD Primary Gastroenterologist:  Dr.  Laurel Dimmer Complaint: dysphagia and aspiration on modified barium swallow HPI: Martha Mcbride is an 80 y.o. white  female  Interstitial pneumonitis and possible superimposed pneumonia. She had been having some degree of dysphagia although fairly mild and noted mainly by her daughter. A modified barium swallow was ordered yesterday which showed severe oropharyngeal defects with frank aspiration and she was recommended to be nothing by mouth. She denies any typical solid food esophageal dysphagia. She had an endoscopy in 2008 which showed a hiatal hernia. She does have history of acid reflux and was once on Fosamax.  Past Medical History  Diagnosis Date  . Arthritis   . Polymyalgia   . Acid reflux   . Immune deficiency disorder     autoimmune disorder - polymyalgia - on prednisone  . Breast cancer     Treated with lumpectomy and radiation  . Giardia   . Pinworms     Past Surgical History  Procedure Laterality Date  . Breast lumpectomy    . Partial hysterectomy    . Tonsillectomy    . Tee without cardioversion N/A 07/20/2014    Procedure: TRANSESOPHAGEAL ECHOCARDIOGRAM (TEE);  Surgeon: Candee Furbish, MD;  Location: [facility] ENDOSCOPY;  Service: Cardiovascular;  Laterality: N/A;    Medications Prior to Admission  Medication Sig Dispense Refill  . antiseptic oral rinse (BIOTENE) LIQD 15 mLs by Mouth Rinse route as needed for dry mouth.    Marland Kitchen aspirin 81 MG chewable tablet Chew 1 tablet (81 mg total) by mouth daily.    . benzonatate (TESSALON) 200 MG capsule Take 1 capsule (200 mg total) by mouth 3 (three) times daily as needed for cough. 20 capsule 0  . fluconazole (DIFLUCAN) 100 MG tablet Take 100 mg by mouth daily.     . folic acid (FOLVITE) 1 MG tablet Take 1 mg by mouth daily.    . furosemide (LASIX) 20 MG tablet Take 1 tablet (20 mg  total) by mouth 2 (two) times daily with breakfast and lunch. 60 tablet 1  . hydroxypropyl methylcellulose / hypromellose (ISOPTO TEARS / GONIOVISC) 2.5 % ophthalmic solution Place 1 drop into both eyes every morning.    Marland Kitchen levofloxacin (LEVAQUIN) 750 MG tablet Take 1 tablet (750 mg total) by mouth every other day. 3 tablet 0  . loratadine (CLARITIN) 10 MG tablet Take 1 tablet (10 mg total) by mouth daily. 30 tablet 1  . metoprolol succinate (TOPROL-XL) 12.5 mg TB24 24 hr tablet Take 0.5 tablets (12.5 mg total) by mouth daily. 30 tablet 1  . potassium chloride SA (K-DUR,KLOR-CON) 20 MEQ tablet Take 2 tablets (40 mEq total) by mouth daily. 60 tablet 1  . predniSONE (DELTASONE) 5 MG tablet Take 5 mg by mouth daily with breakfast.    . RABEprazole (ACIPHEX) 20 MG tablet Take 20 mg by mouth daily.    Marland Kitchen saccharomyces boulardii (FLORASTOR) 250 MG capsule Take 1 capsule (250 mg total) by mouth 2 (two) times daily. 60 capsule 0    Allergies:  Allergies  Allergen Reactions  . Doxycycline Nausea And Vomiting  . Other Nausea And Vomiting and Other (See Comments)    Anesthesia makes her very sleepy and makes it hard for her to come out of it.   Marland Kitchen Penicillins Hives and Nausea And Vomiting  . Sulfa Antibiotics Nausea And Vomiting    Family History  Problem  Relation Age of Onset  . Stroke Mother     Social History:  reports that she has never smoked. She does not have any smokeless tobacco history on file. She reports that she does not drink alcohol or use illicit drugs.  Review of Systems: negative except as above   Blood pressure 160/82, pulse 80, temperature 99.1 F (37.3 C), temperature source Axillary, resp. rate 15, height $RemoveBe'5\' 1"'gLzzIkzve$  (1.549 m), weight 63.2 kg (139 lb 5.3 oz), SpO2 96 %. Head: Normocephalic, without obvious abnormality, atraumatic Neck: no adenopathy, no carotid bruit, no JVD, supple, symmetrical, trachea midline and thyroid not enlarged, symmetric, no  tenderness/mass/nodules Resp: clear to auscultation bilaterally Cardio: regular rate and rhythm, S1, S2 normal, no murmur, click, rub or gallop GI: abdomen soft nondistended with normoactive bowel sounds or megaly masses or guarding. Extremities: extremities normal, atraumatic, no cyanosis or edema  Results for orders placed or performed during the hospital encounter of 07/28/14 (from the past 48 hour(s))  CBC     Status: Abnormal   Collection Time: 07/30/14  8:05 AM  Result Value Ref Range   WBC 9.7 4.0 - 10.5 K/uL   RBC 4.31 3.87 - 5.11 MIL/uL   Hemoglobin 12.6 12.0 - 15.0 g/dL   HCT 37.3 36.0 - 46.0 %   MCV 86.5 78.0 - 100.0 fL   MCH 29.2 26.0 - 34.0 pg   MCHC 33.8 30.0 - 36.0 g/dL   RDW 18.2 (H) 11.5 - 15.5 %   Platelets 212 150 - 400 K/uL  Basic metabolic panel     Status: Abnormal   Collection Time: 07/30/14  8:05 AM  Result Value Ref Range   Sodium 133 (L) 137 - 147 mEq/L   Potassium 4.1 3.7 - 5.3 mEq/L    Comment: DELTA CHECK NOTED SLIGHT HEMOLYSIS HEMOLYSIS AT THIS LEVEL MAY AFFECT RESULT    Chloride 95 (L) 96 - 112 mEq/L   CO2 25 19 - 32 mEq/L   Glucose, Bld 108 (H) 70 - 99 mg/dL   BUN 13 6 - 23 mg/dL   Creatinine, Ser 0.66 0.50 - 1.10 mg/dL   Calcium 8.5 8.4 - 10.5 mg/dL   GFR calc non Af Amer 77 (L) >90 mL/min   GFR calc Af Amer 89 (L) >90 mL/min    Comment: (NOTE) The eGFR has been calculated using the CKD EPI equation. This calculation has not been validated in all clinical situations. eGFR's persistently <90 mL/min signify possible Chronic Kidney Disease.    Anion gap 13 5 - 15  Troponin I     Status: None   Collection Time: 07/30/14 10:55 AM  Result Value Ref Range   Troponin I <0.30 <0.30 ng/mL    Comment:        Due to the release kinetics of cTnI, a negative result within the first hours of the onset of symptoms does not rule out myocardial infarction with certainty. If myocardial infarction is still suspected, repeat the test at appropriate  intervals.   CBC     Status: Abnormal   Collection Time: 07/31/14  3:40 AM  Result Value Ref Range   WBC 9.3 4.0 - 10.5 K/uL   RBC 4.33 3.87 - 5.11 MIL/uL   Hemoglobin 12.5 12.0 - 15.0 g/dL   HCT 37.5 36.0 - 46.0 %   MCV 86.6 78.0 - 100.0 fL   MCH 28.9 26.0 - 34.0 pg   MCHC 33.3 30.0 - 36.0 g/dL   RDW 18.2 (H) 11.5 - 15.5 %  Platelets 188 150 - 400 K/uL  Basic metabolic panel     Status: Abnormal   Collection Time: 07/31/14  3:40 AM  Result Value Ref Range   Sodium 130 (L) 137 - 147 mEq/L   Potassium 4.3 3.7 - 5.3 mEq/L   Chloride 94 (L) 96 - 112 mEq/L   CO2 24 19 - 32 mEq/L   Glucose, Bld 109 (H) 70 - 99 mg/dL   BUN 12 6 - 23 mg/dL   Creatinine, Ser 0.67 0.50 - 1.10 mg/dL   Calcium 8.6 8.4 - 10.5 mg/dL   GFR calc non Af Amer 77 (L) >90 mL/min   GFR calc Af Amer 89 (L) >90 mL/min    Comment: (NOTE) The eGFR has been calculated using the CKD EPI equation. This calculation has not been validated in all clinical situations. eGFR's persistently <90 mL/min signify possible Chronic Kidney Disease.    Anion gap 12 5 - 15  Protime-INR     Status: None   Collection Time: 07/31/14  3:40 AM  Result Value Ref Range   Prothrombin Time 14.3 11.6 - 15.2 seconds   INR 1.09 0.00 - 1.49  Glucose, capillary     Status: Abnormal   Collection Time: 07/31/14  5:57 PM  Result Value Ref Range   Glucose-Capillary 133 (H) 70 - 99 mg/dL  Glucose, capillary     Status: Abnormal   Collection Time: 08/01/14  2:27 AM  Result Value Ref Range   Glucose-Capillary 171 (H) 70 - 99 mg/dL   Comment 1 Documented in Chart    Comment 2 Notify RN   CBC     Status: Abnormal   Collection Time: 08/01/14  3:10 AM  Result Value Ref Range   WBC 5.9 4.0 - 10.5 K/uL   RBC 4.30 3.87 - 5.11 MIL/uL   Hemoglobin 12.5 12.0 - 15.0 g/dL   HCT 37.7 36.0 - 46.0 %   MCV 87.7 78.0 - 100.0 fL   MCH 29.1 26.0 - 34.0 pg   MCHC 33.2 30.0 - 36.0 g/dL   RDW 18.3 (H) 11.5 - 15.5 %   Platelets 167 150 - 400 K/uL  Basic  metabolic panel     Status: Abnormal   Collection Time: 08/01/14  3:10 AM  Result Value Ref Range   Sodium 136 (L) 137 - 147 mEq/L   Potassium 4.2 3.7 - 5.3 mEq/L   Chloride 96 96 - 112 mEq/L   CO2 25 19 - 32 mEq/L   Glucose, Bld 178 (H) 70 - 99 mg/dL   BUN 15 6 - 23 mg/dL   Creatinine, Ser 0.62 0.50 - 1.10 mg/dL   Calcium 8.5 8.4 - 10.5 mg/dL   GFR calc non Af Amer 79 (L) >90 mL/min   GFR calc Af Amer >90 >90 mL/min    Comment: (NOTE) The eGFR has been calculated using the CKD EPI equation. This calculation has not been validated in all clinical situations. eGFR's persistently <90 mL/min signify possible Chronic Kidney Disease.    Anion gap 15 5 - 15   Ct Chest Wo Contrast  07/30/2014   CLINICAL DATA:  Fever and elevated white blood cell count. Chronic shortness of breath and chest pain. History of breast carcinoma in 2000  EXAM: CT CHEST WITHOUT CONTRAST  TECHNIQUE: Multidetector CT imaging of the chest was performed following the standard protocol without IV contrast material administration.  COMPARISON:  Chest radiograph August 08, 2014  FINDINGS: There is underlying emphysematous change with interstitial  fibrosis and honeycombing throughout both upper lobes, primarily medially as well as in the lingula, right middle lobe, and both lower lobes. There is diffuse traction type bronchiectasis.  There are moderate free-flowing effusions bilaterally. There are areas of consolidation in both lower lobes in a somewhat patchy distribution. Lesser degrees of airspace consolidation are noted in both upper lobes.  On axial slice 32 series 5, there is a 5 mm nodular opacity in the posterior segment of the right upper lobe.  There is atherosclerotic change in the aorta but no aneurysm. No pulmonary embolus is seen on this noncontrast enhanced study. There is extensive calcification in the mitral valve and aortic valve regions.  There are multiple small lymph nodes throughout the mediastinum. There is  a lymph node in the anterior precarinal region measuring 1.6 by 1.4 cm. A second lymph node in this area measures 2.0 x 1.2 cm. A lymph node in the aortopulmonary window region measures 1.5 x 1.3 cm.  There is a moderate hiatal hernia.  In the visualized upper abdominal region, there is atherosclerotic change in the aorta. There is cholelithiasis. There is a small cyst in the posterior segment of the right lobe of the liver.  There is degenerative change in the thoracic spine with increase in kyphosis. There are no blastic or lytic bone lesions. Thyroid appears unremarkable.  IMPRESSION: Evidence of emphysema with traction bronchiectasis in multiple areas of interstitial fibrosis. This appearance is consistent with underlying usual interstitial pneumonitis.  There are areas of patchy consolidation in both lower lobes as well as to a lesser extent in the upper lobes. There also moderate pleural effusions. The areas of consolidation are felt to most likely represent superimposed pneumonia. There may be a degree of superimposed congestive heart failure as well given the pleural effusions.  5 mm nodular opacity in the posterior segment of the right upper lobe. Followup of this nodular opacity should be based on Fleischner Society guidelines. If the patient is at high risk for bronchogenic carcinoma, follow-up chest CT at 6-12 months is recommended. If the patient is at low risk for bronchogenic carcinoma, follow-up chest CT at 12 months is recommended. This recommendation follows the consensus statement: Guidelines for Management of Small Pulmonary Nodules Detected on CT Scans: A Statement from the [employer] as published in Radiology 2005;237:395-400.  Areas of atherosclerotic change and cardiac valvular calcification.  Areas of relatively mild adenopathy.  Cholelithiasis.  Given the areas of questionable pneumonia, it may be prudent to consider a followup study in 2-3 weeks to assess for clearing.    Electronically Signed   By: Lowella Grip M.D.   On: 07/30/2014 11:39   Dg Swallowing Func-speech Pathology  07/31/2014   Macario Golds, CCC-SLP     07/31/2014  1:52 PM Objective Swallowing Evaluation: Modified Barium Swallowing Study   Patient Details  Name: DIETRA STOKELY MRN: [id number] Date of Birth: [birth year]  Today's Date: 07/31/2014 Time: 1245-1315 SLP Time Calculation (min): 30 min  Past Medical History:  Past Medical History  Diagnosis Date  . Arthritis   . Polymyalgia   . Acid reflux   . Immune deficiency disorder     autoimmune disorder - polymyalgia - on prednisone  . Breast cancer     Treated with lumpectomy and radiation  . Giardia   . Pinworms    Past Surgical History:  Past Surgical History  Procedure Laterality Date  . Breast lumpectomy    . Partial hysterectomy    .  Tonsillectomy    . Tee without cardioversion N/A 07/20/2014    Procedure: TRANSESOPHAGEAL ECHOCARDIOGRAM (TEE);  Surgeon: Candee Furbish, MD;  Location: [facility] ENDOSCOPY;  Service: Cardiovascular;   Laterality: N/A;   HPI:  80 y/o female with polymyalgia on chronic prednisone (5mg ) and  leflunomide admitted on 10/31 to [facility] with acute hypoxemic  respiratory failure and diffuse bilateral infiltrates per MD  note.  Pt also with recent Eosinophilia, CHF, acid reflux,  parasitic diagnosis - giardia, pinworm, roundworms.  Order for  swallow evaluation received and daughter [name] questioned if pt  may dysphagia.  Pt denies dysphagia but admits to "memory  problem" and occasional choking.  She does take her pills with  applesauce premorbidly - daughter [name] via speaker phone stated  this was only due to pt consuming a full cup of water with pills  and complaining of being full - therefore [name] recommended pt  take pills with applesauce.  CT Chest 11/2 showed "Evidence of  emphysema with traction bronchiectasis in multiple areas of  interstitial fibrosis. This appearance is consistent with  underlying usual interstitial pneumonitis. There are  areas of  patchy consolidation in both lower lobes as well as to a lesser  extent in the upper lobes. There also moderate pleural effusions.  The areas of consolidation are felt to most likely represent  superimposed pneumonia. There may be a degree of superimposed  congestive heart failure as well given the pleural effusions. 5  mm nodular opacity in the posterior segment of the right upper  lobe.  Areas of atherosclerotic change and cardiac valvular  calcification."     Assessment / Plan / Recommendation Clinical Impression  Dysphagia Diagnosis: Moderate oral phase dysphagia;Moderate  pharyngeal phase dysphagia;Moderate cervical esophageal phase  dysphagia  Clinical impression: Moderate oropharyngeal and suspected  cervical esophageal dysphagia with + aspiration or penetration  with each consistency tested.  Sensorimotor deficits noted  impacting swallow ability.   Premature spillage of boluses into pharynx noted d/t weakness and  compromised laryngeal elevation resulted in + aspiration of  nectar *mixing with secretions* before the swallow.  She senses  penetration/aspiration intermittently and can expectorate but was  noted to be excessively fatigued and mildly dyspenic after just a  few tsps of intake.  Further boluses were re-penetrated or  aspirated.  Moderate vallecular residuals also noted with pudding  and cracker more than liquids WITHOUT pt awareness.  Pt also is  aspirating secretions that mix with barium.     Pt is at high aspiration risk currently and recommend she be NPO  except small single ice chips after oral care.  ? If aspiration  could be contributing to pt's fevers, pneumonia.  Daughter [name]  present during testing and both pt/daughter educated to findings.     Further informed [name] that feeding tubes do not prevent  aspiration.  SLP uncertain of pt's swallow prognosis given ?  source of oropharyngeal dysphagia and pt's deconditioning and  ongoing illness x2 months per [name]. Will follow up to  help with  care plan for to determine readiness for repeat evaluation if  indicated.     Treatment Recommendation  Therapy as outlined in treatment plan below    Diet Recommendation NPO;Ice chips PRN after oral care (single  small ice chips after oral care pending plans)        Other  Recommendations Oral Care Recommendations: Oral care Q4  per protocol   Follow Up Recommendations    TBD  Frequency and Duration min 1 x/week  1 week   Pertinent Vitals/Pain Febrile, congested     General Date of Onset: 07/30/14 HPI: 80 y/o female with polymyalgia on chronic prednisone (5mg )  and leflunomide admitted on 10/31 to [facility] with acute hypoxemic  respiratory failure and diffuse bilateral infiltrates per MD  note.  Pt also with recent Eosinophilia, CHF, acid reflux,  parasitic diagnosis - giardia, pinworm, roundworms.  Order for  swallow evaluation received and daughter [name] questioned if pt  may dysphagia.  Pt denies dysphagia but admits to "memory  problem" and occasional choking.  She does take her pills with  applesauce premorbidly - daughter [name] via speaker phone stated  this was only due to pt consuming a full cup of water with pills  and complaining of being full - therefore [name] recommended pt  take pills with applesauce.  CT Chest 11/2 showed "Evidence of  emphysema with traction bronchiectasis in multiple areas of  interstitial fibrosis. This appearance is consistent with  underlying usual interstitial pneumonitis. There are areas of  patchy consolidation in both lower lobes as well as to a lesser  extent in the upper lobes. There also moderate pleural effusions.  The areas of consolidation are felt to most likely represent  superimposed pneumonia. There may be a degree of superimposed  congestive heart failure as well given the pleural effusions. 5  mm nodular opacity in the posterior segment of the right upper  lobe.  Areas of atherosclerotic change and cardiac valvular  calcification." Type of Study: Modified  Barium Swallowing Study Reason for Referral: Objectively evaluate swallowing function  (rule out overt aspiration) Diet Prior to this Study: Thin liquids Temperature Spikes Noted: Yes Respiratory Status: Room air History of Recent Intubation: No Behavior/Cognition: Alert;Cooperative;Pleasant mood Oral Cavity - Dentition: Dentures, top (lower partial) Oral Motor / Sensory Function: Impaired - see Bedside swallow  eval Self-Feeding Abilities: Able to feed self;Needs set up Patient Positioning: Upright in chair Baseline Vocal Quality: Clear [name] reports voice will change  intermittently  - likely due to excessive coughing) Volitional Cough: Strong (productive) Volitional Swallow: Able to elicit Anatomy: Within functional limits Pharyngeal Secretions: Not observed secondary MBS    Reason for Referral Objectively evaluate swallowing function  (rule out overt aspiration)   Oral Phase Oral Preparation/Oral Phase Oral Phase: Impaired Oral - Honey Oral - Honey Teaspoon: Reduced posterior  propulsion;Lingual/palatal residue;Weak lingual manipulation Oral - Nectar Oral - Nectar Teaspoon: Reduced posterior  propulsion;Lingual/palatal residue;Weak lingual manipulation Oral - Thin Oral - Thin Teaspoon: Reduced posterior propulsion;Weak lingual  manipulation Oral - Thin Cup: Reduced posterior propulsion;Lingual/palatal  residue;Weak lingual manipulation Oral - Solids Oral - Puree: Reduced posterior propulsion;Lingual/palatal  residue;Weak lingual manipulation Oral - Regular: Reduced posterior propulsion;Weak lingual  manipulation Oral Phase - Comment Oral Phase - Comment: premature spillage of barium into pharynx  with decreased control   Pharyngeal Phase Pharyngeal Phase Pharyngeal Phase: Impaired Pharyngeal - Honey Pharyngeal - Honey Teaspoon: Premature spillage to  valleculae;Reduced anterior laryngeal mobility;Reduced laryngeal  elevation;Reduced airway/laryngeal closure;Reduced tongue base  retraction;Reduced epiglottic  inversion;Reduced pharyngeal  peristalsis;Penetration/Aspiration before swallow;Pharyngeal  residue - valleculae;Pharyngeal residue - cp segment Penetration/Aspiration details (honey teaspoon): Material enters  airway, CONTACTS cords and not ejected out Pharyngeal - Nectar Pharyngeal - Nectar Teaspoon: Premature spillage to  valleculae;Reduced anterior laryngeal mobility;Reduced epiglottic  inversion;Reduced airway/laryngeal closure;Reduced laryngeal  elevation;Reduced tongue base retraction;Reduced pharyngeal  peristalsis;Trace aspiration;Penetration/Aspiration before  swallow;Penetration/Aspiration during swallow;Pharyngeal residue  - cp segment Penetration/Aspiration details (nectar teaspoon): Material  enters  airway, passes BELOW cords without attempt by patient to eject  out (silent aspiration) Pharyngeal - Thin Pharyngeal - Thin Teaspoon: Reduced epiglottic inversion;Reduced  laryngeal elevation;Reduced airway/laryngeal closure;Reduced  anterior laryngeal mobility;Reduced pharyngeal  peristalsis;Pharyngeal residue - cp segment Pharyngeal - Thin Cup: Reduced anterior laryngeal  mobility;Reduced epiglottic inversion;Premature spillage to  pyriform sinuses;Reduced laryngeal elevation;Reduced  airway/laryngeal closure;Reduced pharyngeal peristalsis;Trace  aspiration;Penetration/Aspiration before swallow;Pharyngeal  residue - valleculae;Pharyngeal residue - cp segment Penetration/Aspiration details (thin cup): Material enters  airway, CONTACTS cords and not ejected out;Material enters  airway, passes BELOW cords without attempt by patient to eject  out (silent aspiration) Pharyngeal - Solids Pharyngeal - Puree: Reduced pharyngeal peristalsis;Reduced  airway/laryngeal closure;Reduced epiglottic inversion;Reduced  tongue base retraction;Reduced laryngeal elevation;Premature  spillage to valleculae;Penetration/Aspiration before  swallow;Pharyngeal residue - cp segment Penetration/Aspiration details (puree): Material  enters airway,  remains ABOVE vocal cords then ejected out Pharyngeal - Regular: Reduced tongue base retraction;Reduced  epiglottic inversion;Reduced anterior laryngeal  mobility;Premature spillage to valleculae;Reduced laryngeal  elevation;Reduced airway/laryngeal closure;Reduced pharyngeal  peristalsis;Pharyngeal residue - valleculae;Pharyngeal residue -  cp segment Pharyngeal Phase - Comment Pharyngeal Comment: pt without awareness to pharyngeal residuals  mixing with barium/secretions, cued dry swallows marginally  effective to decrease residuals, pt able to expectorate to clear  but caused significant fatigue for pt after just a few boluses,  chin tuck posture not effective (pt unable to fully conduct due  to neck discomfort)   Cervical Esophageal Phase    GO    Cervical Esophageal Phase Cervical Esophageal Phase: Impaired Cervical Esophageal Phase - Honey Honey Teaspoon: Reduced cricopharyngeal relaxation Cervical Esophageal Phase - Nectar Nectar Teaspoon: Reduced cricopharyngeal relaxation Cervical Esophageal Phase - Thin Thin Teaspoon: Reduced cricopharyngeal relaxation Thin Cup: Reduced cricopharyngeal relaxation Cervical Esophageal Phase - Solids Puree: Reduced cricopharyngeal relaxation Regular: Reduced cricopharyngeal relaxation Cervical Esophageal Phase - Comment Cervical Esophageal Comment: appearance of barium residuals in  mid-esophagus, cued dry swallow nor liquid swallows effective to  clear, radiologist not present to confirm and pt could not  undergo esophagram due to aspiration         Luanna Salk, MS [facility] SLP [phone]     Assessment: Oropharyngeal dysphagia of unclear duration and certain whether specific neurologic insult caused this, does not sound esophageal at this point Plan:  Based on the results of modified barium swallow would recommend nasoenteric feedings for now. When overall medical situation improved repeat modified barium swallow. EGD could be performed but current  deficits are not not likely to be contributed to by an esophageal etiology . We'll follow with you. Jaelyn Bourgoin C 08/01/2014, 7:53 AM

## 2014-08-01 NOTE — Evaluation (Signed)
Occupational Therapy Evaluation Patient Details Name: Martha Mcbride MRN: [id number] DOB: 17-May-1925 Today's Date: 08/01/2014    History of Present Illness 80 yo female admitted with sepsis, hypoxia. Hx of weakness, polymyalgia rheumatica, breast cancer, R foot ischemic toes. Recent d/c from [place] on 10/26. Pt is from home with family.    Clinical Impression   Pt was admitted for the above.  She will benefit from skilled OT to increase safety and independence with adls.  Pt needs mod to max A for LB adls.  She has general weakness in bil UEs and wants to rebuild strength.  Will follow in acute. Goals are overall min A to min guard.      Follow Up Recommendations  Home health OT;Supervision/Assistance - 24 hour    Equipment Recommendations  None recommended by OT    Recommendations for Other Services       Precautions / Restrictions Precautions Precautions: Fall Restrictions Weight Bearing Restrictions: No      Mobility Bed Mobility               General bed mobility comments: pt sitting in recliner  Transfers Overall transfer level: Needs assistance Equipment used: Rolling walker (2 wheeled) Transfers: Sit to/from Stand Sit to Stand: Min assist         General transfer comment: Assist to rise, stabilize, control descent.     Balance Overall balance assessment: Needs assistance         Standing balance support: Bilateral upper extremity supported Standing balance-Leahy Scale: Poor                              ADL Overall ADL's : Needs assistance/impaired Eating/Feeding: NPO   Grooming: Set up;Sitting   Upper Body Bathing: Supervision/ safety (assist with lines)   Lower Body Bathing: Moderate assistance;Sit to/from stand   Upper Body Dressing : Minimal assistance;Sitting (lines)   Lower Body Dressing: Moderate assistance;Sit to/from stand   Toilet Transfer: Minimal assistance;Stand-pivot;BSC   Toileting- Clothing Manipulation and  Hygiene: Maximal assistance;Sit to/from stand         General ADL Comments: Pt needs assistance for ADLS due to decreased endurance and balance.  Daughter wants UE exercise program:  will provide this today.  Daughter has been helping pt since last admission     Vision                     Perception     Praxis      Pertinent Vitals/Pain Pain Assessment: No/denies pain     Hand Dominance     Extremity/Trunk Assessment Upper Extremity Assessment Upper Extremity Assessment: Generalized weakness      Cervical / Trunk Assessment Cervical / Trunk Assessment: Kyphotic   Communication Communication Communication: No difficulties   Cognition Arousal/Alertness: Awake/alert Behavior During Therapy: WFL for tasks assessed/performed Overall Cognitive Status: Within Functional Limits for tasks assessed                     General Comments       Exercises       Shoulder Instructions      Home Living Family/patient expects to be discharged to:: Private residence Living Arrangements: Alone Available Help at Discharge: Family Type of Home: House       Home Layout: One level               Home Equipment: Environmental consultant - 2 wheels;Cane -  single point          Prior Functioning/Environment Level of Independence: Needs assistance  Gait / Transfers Assistance Needed: needing Min assit for ambulation with walker on last visit.      Comments: has had ADL assistance as needed since last hospitalization    OT Diagnosis: Generalized weakness   OT Problem List: Decreased strength;Decreased activity tolerance;Impaired balance (sitting and/or standing);Decreased knowledge of use of DME or AE   OT Treatment/Interventions: Self-care/ADL training;DME and/or AE instruction;Patient/family education;Balance training;Therapeutic exercise;Therapeutic activities    OT Goals(Current goals can be found in the care plan section) Acute Rehab OT Goals Patient Stated Goal:  to get stronger. home.  OT Goal Formulation: With patient/family Time For Goal Achievement: 08/15/14 Potential to Achieve Goals: Good ADL Goals Pt Will Perform Lower Body Bathing: with min assist;sit to/from stand Pt Will Perform Lower Body Dressing: with min assist;sit to/from stand Pt Will Transfer to Toilet: with min guard assist;ambulating;bedside commode Pt Will Perform Toileting - Clothing Manipulation and hygiene: with min assist;sit to/from stand Additional ADL Goal #1: Family will be independent in assisting pt with bil UE level one theraband program and AROM exercises  OT Frequency: Min 2X/week   Barriers to D/C:            Co-evaluation              End of Session    Activity Tolerance: Patient tolerated treatment well Patient left: in chair;with call bell/phone within reach;with family/visitor present   Time: 1359-1407 OT Time Calculation (min): 8 min Charges:  OT General Charges $OT Visit: 1 Procedure OT Evaluation $Initial OT Evaluation Tier I: 1 Procedure G-Codes:    Hadessah Grennan 08/08/2014, 3:27 PM  Lesle Chris, OTR/L [phone] Aug 08, 2014

## 2014-08-01 NOTE — Progress Notes (Addendum)
Speech Language Pathology Treatment: Dysphagia  Patient Details Name: Martha Mcbride MRN: [id number] DOB: October 28, 1924 Today's Date: 08/01/2014 Time: 1021-1173 SLP Time Calculation (min): 13 min  Assessment / Plan / Recommendation Clinical Impression  SLP received order to re-evaluate pt's swallow ability and recommend diet per MD order.  SLP spoke to pt and daughter Martha Mcbride in room and reviewed results of MBS study in detail. Later in session, Martha Mcbride callled and was included in the conversation via speaker phone. Informed all members that pt had a detailed swallow evaluation in xray yesterday that allowed visualization of oropharyngeal and due to sensorimotor deficits, SLP was not comfortable providing anything by mouth due to aspiration risk.  Also addressed pt's quick fatigue after swallowing only 5 tsps of barium - this is also an important risk factor.    Single ice chips po only provided to decrease disuse muscle atrophy and are only to be given in small amount assuring tolerance.   Advised pt to conduct effortful swallow with ice chip intake and cough to expectorate as needed.  Martha Mcbride inquired regarding possible exercises for her mother to conduct, SLP only advised effortful swallow with ice chip boluses as this is a functional swallowing exercise.  Pt using oral suction to clear secretions and RN reports pt with some coughing today on ice chips.     Advised to aspiration of secretions observed during MBS testing and that feeding tube will not prevent aspiration - will provide nutrition.  All reported understanding to information.  Daughter Martha Mcbride reported concern regarding pt's need for nutrition and pt/Martha Mcbride agreeable to discuss short term feeding tube.    SLP uncertain to source of dysphagia or length of time present and uncertain of prognosis for swallow to improve.  Daughter Martha Mcbride reports mother had oral sores a few weeks ago that caused significant pain with swallowing.  Upon treatment and clearance  of oral sores, pt states she felt that her swallow returned to normal ability.    Do not recommend a repeat MBS at this time as it is not clinically indicated. SLP will follow for readiness for repeat evaluation clinically - hopefully with medical improvement.     Using teach back with pt, information was reinforced.    HPI HPI: 80 y/o female with polymyalgia on chronic prednisone (5mg ) and leflunomide admitted on 10/31 to [facility] with acute hypoxemic respiratory failure and diffuse bilateral infiltrates per MD note.  Pt also with recent Eosinophilia, CHF, acid reflux, parasitic diagnosis - giardia, pinworm, roundworms.  Order for swallow evaluation received and daughter Martha Mcbride questioned if pt may dysphagia.  Pt denies dysphagia but admits to "memory problem" and occasional choking.  She does take her pills with applesauce premorbidly - daughter Martha Mcbride via speaker phone stated this was only due to pt consuming a full cup of water with pills and complaining of being full - therefore Martha Mcbride recommended pt take pills with applesauce.  CT Chest 11/2 showed "Evidence of emphysema with traction bronchiectasis in multiple areas of interstitial fibrosis. This appearance is consistent with underlying usual interstitial pneumonitis. There are areas of patchy consolidation in both lower lobes as well as to a lesser extent in the upper lobes. There also moderate pleural effusions. The areas of consolidation are felt to most likely represent superimposed pneumonia. There may be a degree of superimposed congestive heart failure as well given the pleural effusions. 5 mm nodular opacity in the posterior segment of the right upper lobe.  Areas of atherosclerotic change and cardiac valvular  calcification."   Pertinent Vitals Pain Assessment: No/denies pain  SLP Plan  Continue with current plan of care    Recommendations Diet recommendations: NPO (ice chips afetr oral care only with strict aspiration precautions)               Oral Care Recommendations: Oral care Q4 per protocol Follow up Recommendations:  (TBD) Plan: Continue with current plan of care    [place], [place] [facility] SLP [phone]

## 2014-08-01 NOTE — Consult Note (Signed)
PULMONARY / CRITICAL CARE MEDICINE   Name: Martha Mcbride MRN: [id number] DOB: 1924/10/04    ADMISSION DATE:  07/28/2014 CONSULTATION DATE:  10/31  REFERRING MD :  Tyrell Antonio  CHIEF COMPLAINT:  Shortness of breath   HISTORY OF PRESENT ILLNESS:  80 y.o. female with PMH significant for Polymyalgia on prednisone, severely calcified mitral valve with moderate stenosis, chronic diastolic heart failure,Gangrenous toes on the right foot , last admission 10-27 was treated for health care associated PNA, encephalopathy, and diastolic HF exacerbation. She was brought to the emergency department 10/31 for worsening shortness of breath and hypoxia as noted by a home pulse oximeter that the daughter has. Per daughter patient has been having problem of low oxygen level at night during sleep. She was trying to arrange home oxygen through  PCP but was unsuccessful. Admitted 10/31 with fever 103, confusion,productive cough. WBC at 12, Lactic acid at 3.5. Chest x ray -diffuse bilateral pulmonary interstitial infiltrates. She was seen by my partners Mcquaid who felt that a bronchoscopy was needed given eosinophilia (AEC 1800 on 10/22 which came down to 600 on 10/31). Family did not want my other partner Nelda Marseille to perform the procedure & requested a second opinion. Diarrhea is resolved & felt unlikely to be parasitic by ID - stool O & P neg. She failed swallow eval- MBS & nasogastric Tfs are planned, seen by GI. She is afebrile now & WBC count has decreased. She also has memory issues but was apparently functional  I spoke to pt and daughter Mcbride Martha in room. Other daughterJoanne callled and was included  via speaker phone.  STUDIES:  11/2 CT: Evidence of emphysema with traction bronchiectasis in multiple areas of interstitial fibrosis. This appearance is consistent with underlying usual interstitial pneumonitis. There are areas of patchy consolidation in both lower lobes as wellas to a lesser extent in the upper  lobes. There also moderate pleural effusions. The areas of consolidation are felt to most likely represent superimposed pneumonia. There may be a degree of superimposed congestive heart failure as well given the pleural effusions.5 mm nodular opacity in the posterior segment of the right upper lobe. Areas of atherosclerotic change and cardiac valvular calcification. Areas of relatively mild adenopathy. Cholelithiasis. 04/2012 CT abd >>large hiatal hernia . Prominent interstitial lung markings suggest chronic changes  06/2014 TEE mild MS, mod AS, nml LVfn  PAST MEDICAL HISTORY :   has a past medical history of Arthritis; Polymyalgia; Acid reflux; Immune deficiency disorder; Breast cancer; Giardia; and Pinworms.  has past surgical history that includes Breast lumpectomy; Partial hysterectomy; Tonsillectomy; and TEE without cardioversion (N/A, 07/20/2014). Prior to Admission medications   Medication Sig Start Date End Date Taking? Authorizing Provider  antiseptic oral rinse (BIOTENE) LIQD 15 mLs by Mouth Rinse route as needed for dry mouth.   Yes Historical Provider, MD  aspirin 81 MG chewable tablet Chew 1 tablet (81 mg total) by mouth daily. 07/24/14  Yes Barton Dubois, MD  benzonatate (TESSALON) 200 MG capsule Take 1 capsule (200 mg total) by mouth 3 (three) times daily as needed for cough. 07/24/14  Yes Barton Dubois, MD  fluconazole (DIFLUCAN) 100 MG tablet Take 100 mg by mouth daily.  07/09/14  Yes Historical Provider, MD  folic acid (FOLVITE) 1 MG tablet Take 1 mg by mouth daily.   Yes Historical Provider, MD  furosemide (LASIX) 20 MG tablet Take 1 tablet (20 mg total) by mouth 2 (two) times daily with breakfast and lunch. 07/24/14  Yes Barton Dubois,  MD  hydroxypropyl methylcellulose / hypromellose (ISOPTO TEARS / GONIOVISC) 2.5 % ophthalmic solution Place 1 drop into both eyes every morning.   Yes Historical Provider, MD  levofloxacin (LEVAQUIN) 750 MG tablet Take 1 tablet (750 mg total) by  mouth every other day. 07/25/14  Yes Barton Dubois, MD  loratadine (CLARITIN) 10 MG tablet Take 1 tablet (10 mg total) by mouth daily. 07/24/14  Yes Barton Dubois, MD  metoprolol succinate (TOPROL-XL) 12.5 mg TB24 24 hr tablet Take 0.5 tablets (12.5 mg total) by mouth daily. 07/24/14  Yes Barton Dubois, MD  potassium chloride SA (K-DUR,KLOR-CON) 20 MEQ tablet Take 2 tablets (40 mEq total) by mouth daily. 07/24/14  Yes Barton Dubois, MD  predniSONE (DELTASONE) 5 MG tablet Take 5 mg by mouth daily with breakfast.   Yes Historical Provider, MD  RABEprazole (ACIPHEX) 20 MG tablet Take 20 mg by mouth daily.   Yes Historical Provider, MD  saccharomyces boulardii (FLORASTOR) 250 MG capsule Take 1 capsule (250 mg total) by mouth 2 (two) times daily. 07/24/14  Yes Barton Dubois, MD   Allergies  Allergen Reactions  . Doxycycline Nausea And Vomiting  . Other Nausea And Vomiting and Other (See Comments)    Anesthesia makes her very sleepy and makes it hard for her to come out of it.   Marland Kitchen Penicillins Hives and Nausea And Vomiting  . Sulfa Antibiotics Nausea And Vomiting    FAMILY HISTORY:  has no family status information on file.  SOCIAL HISTORY:  reports that she has never smoked. She does not have any smokeless tobacco history on file. She reports that she does not drink alcohol or use illicit drugs.  Constitutional: negative for anorexia, fevers and sweats  Eyes: negative for irritation, redness and visual disturbance  Ears, nose, mouth, throat, and face: negative for earaches, epistaxis, nasal congestion and sore throat  Respiratory: negative for cough, dyspnea on exertion, sputum and wheezing  Cardiovascular: negative for chest pain, dyspnea, lower extremity edema, orthopnea, palpitations and syncope  Gastrointestinal: negative for abdominal pain, constipation, diarrhea, melena, nausea and vomiting  Genitourinary:negative for dysuria, frequency and hematuria  Hematologic/lymphatic: negative for  bleeding, easy bruising and lymphadenopathy  Musculoskeletal:negative for arthralgias, muscle weakness and stiff joints  Neurological: negative for coordination problems, gait problems, headaches and weakness  Endocrine: negative for diabetic symptoms including polydipsia, polyuria and weight loss  SUBJECTIVE: Denies dyspnea, chest pain. No abdominal pain.   VITAL SIGNS: Temp:  [97.5 F (36.4 C)-99.3 F (37.4 C)] 97.5 F (36.4 C) (11/04 1200) Pulse Rate:  [53-89] 83 (11/04 1500) Resp:  [15-30] 15 (11/04 1500) BP: (110-160)/(51-82) 110/51 mmHg (11/04 1500) SpO2:  [96 %-98 %] 98 % (11/04 1500) Weight:  [63.2 kg (139 lb 5.3 oz)] 63.2 kg (139 lb 5.3 oz) (11/04 0500) HEMODYNAMICS:   VENTILATOR SETTINGS:   INTAKE / OUTPUT:  Intake/Output Summary (Last 24 hours) at 08/01/14 1753 Last data filed at 08/01/14 1000  Gross per 24 hour  Intake    200 ml  Output    500 ml  Net   -300 ml    PHYSICAL EXAMINATION: Gen: comfortable in chair, no acute distress HEENT: NCAT, EOMi, OP clear, neck supple without masses PULM: Crackles bilaterally throughout both bases. R>L crackles CV: RRR, loud systolic murmur, no JVD AB: BS+, soft, nontender, no hsm Ext: warm, trace edema, no clubbing, no cyanosis Derm: no rash or skin breakdown, R 2nd/3rd toes with dry gangrene  Neuro: A&Ox4, CN II-XII intact, MAEW  LABS:  CBC  Recent Labs Lab 07/30/14 0805 07/31/14 0340 08/01/14 0310  WBC 9.7 9.3 5.9  HGB 12.6 12.5 12.5  HCT 37.3 37.5 37.7  PLT 212 188 167   Coag's  Recent Labs Lab 07/31/14 0340  INR 1.09   BMET  Recent Labs Lab 07/30/14 0805 07/31/14 0340 08/01/14 0310  NA 133* 130* 136*  K 4.1 4.3 4.2  CL 95* 94* 96  CO2 _0 BUN _1 CREATININE 0.66 0.67 0.62  GLUCOSE 108* 109* 178*   Electrolytes  Recent Labs Lab 07/30/14 0805 07/31/14 0340 08/01/14 0310  CALCIUM 8.5 8.6 8.5   Sepsis Markers  Recent Labs Lab 07/28/14 1225 07/29/14 0330   LATICACIDVEN 3.5* 2.4*   ABG No results for input(s): PHART, PCO2ART, PO2ART in the last 168 hours. Liver Enzymes  Recent Labs Lab 07/28/14 1225  AST 18  ALT 24  ALKPHOS 119*  BILITOT 0.8  ALBUMIN 2.8*   Cardiac Enzymes  Recent Labs Lab 07/28/14 1225 07/29/14 0330 07/30/14 1055  TROPONINI <0.30  --  <0.30  PROBNP 576.5* 753.4*  --    Glucose  Recent Labs Lab 07/30/14 0520 07/31/14 1757 08/01/14 0227  GLUCAP 114* 133* 171*    Imaging    ASSESSMENT / PLAN:  PULMONARY OETT A:  Acute hypoxic respiratory failure -resolved Favor IPF Pulmonary edema Favor Aspiration PNA over  eosinophilic PNA  P:   I doubt that bronch would change management here CT is consistent with UIP with fibrosis and traction bronchiectasis -not a candidate for IPF meds Oxygen protocol for SpO2 <88% - chk noct oximetry & ambulatory satn on day of discharge - she does not seem to need o2 at rest today. Would complete clinda x 1 more day , then stop  CARDIOVASCULAR CVL A: Chronic diastolic heart failure P:  Continue toporol xl Continue lasix with goal net negative   GASTROINTESTINAL A:  Diarrhea Malnutrition Dysphagia , large hiatal hernia- high aspiration risk Unlikely to have ascariasis, giardia and enterobiasis in someone without recent travel to a developing country even with some immunosuppression. Ova and parasite stool neg.  Strongyloids antibody pending.  Quantiferon Tb neg  P:   GI & speech following   NEUROLOGIC A:  Polymyalgia rheumatica -ESR 9 P:   On prednisone daily, can decrease to 20 mg solumedrol & finally to home dose prednisone on dc  DERM A: gangrenous toes on right foot Decubitus ulcer P:  Follow with vascular as outpatient Wound care consulted and following  Discussed above plan with pt & daughters in presence of nursing director - FU appt made with me ion 11/17 @ 10 am Pl re-assess O2 needs on discharge Unfortunately her dysphagia &  memory issues seem to be more important at this time  Kara Mead MD. Shade Flood. [facility] Pager [phone] If no response call [phone]    08/01/2014, 5:53 PM

## 2014-08-02 ENCOUNTER — Inpatient Hospital Stay (HOSPITAL_COMMUNITY): Payer: Medicare Other

## 2014-08-02 LAB — GLUCOSE, CAPILLARY
GLUCOSE-CAPILLARY: 142 mg/dL — AB (ref 70–99)
GLUCOSE-CAPILLARY: 149 mg/dL — AB (ref 70–99)

## 2014-08-02 MED ORDER — VITAL HIGH PROTEIN PO LIQD: 1000.0000 mL | ORAL | Status: DC

## 2014-08-02 MED ORDER — VITAMINS A & D EX OINT
TOPICAL_OINTMENT | CUTANEOUS | Status: AC
  Administered 2014-08-02: 20:00:00
  Filled 2014-08-02: qty 5

## 2014-08-02 MED ORDER — VITAL AF 1.2 CAL PO LIQD
1000.0000 mL | ORAL | Status: DC
  Administered 2014-08-02: 1000 mL
  Filled 2014-08-02 (×2): qty 1000

## 2014-08-02 MED ORDER — IOHEXOL 300 MG/ML  SOLN
50.0000 mL | Freq: Once | INTRAMUSCULAR | Status: AC | PRN
  Administered 2014-08-02: 30 mL

## 2014-08-02 NOTE — Progress Notes (Signed)
Pt's daughter [name], states that she would like a "safety zone" to be done regarding her mother because the wrong tube was placed and it was difficult for her mother to endure today. Explained to pt's daughter that Dr. Tyrell Antonio, the internal medicine physician, who is seeing her mother on a daily basis wrote the order for an NG tube to be placed. Daughter states that she was told by Dr. Amedeo Plenty that a panda tube needed to be placed and she does no want this issue to be ignored. Explained to pt's daughter that her complaints would be addressed and some one would be in with her to discuss her concerns.  The charge nurse Kallie Locks and the [facility] for the night Kristy notified of the complaints voiced by the daughter.

## 2014-08-02 NOTE — Progress Notes (Signed)
[facility]  Patient Status: Active (receiving services up to time of hospitalization)  [facility] is providing the following services: RN, PT and HHA.  I will follow for Home Health and DME needs for discharge. Pt's daughter [name] was requesting the additional service of [place] [facility] and O2 at d/c.   If patient discharges after hours, please call [phone].   Lurlean Leyden 08/02/2014, 8:42 PM

## 2014-08-02 NOTE — Progress Notes (Signed)
NUTRITION FOLLOW UP  Intervention:   Initiate Vital AF 1.2 @ 20 ml/hr via NGT and increase by 10 ml every 4 hours to goal rate of 55 ml/hr.   Tube feeding regimen provides 1584 kcal (100% of needs), 99 grams of protein, and 1071 ml of H2O.    Nutrition Dx:   Inadequate oral intake related to decreased appetite/taste changes/nausea as evidenced by PO intake < 75%.; ongoing, now related to NPO status  Goal: Pt to meet >/= 90% of their estimated nutrition needs; not applicable with NPO status   New Goal:   TF to meet >/= 90% of their estimated nutrition needs    Monitor:   TF tolerance, swallow profile, total protein/energy intake, labs, weights, skin integrity  Assessment:   11/02: -Pt's daughter reported pt with decreased appetite since previous hospitalization in 05/2014. PO intake had significant decreased iin past 3 weeks. -Diet recall indicated pt consuming 50% Boost supplements and 5 bites of food for entire day. Daughter had tried increasing caloric density of Boost by adding creamers. Family estimated pt consuming < 500 kcal/day -Pt endorsed feelings of nausea, early satiety and taste changes that inhibited PO intake; would sometimes experience emesis episodes after taking medications -Is eating better during admit, was able to eat >50% of meals yesterday. Denied nausea/abd pain post meals. Family assisted in encouraging meals and chopping up foods pt unable to chew, particularly vegetables. May benefit from adding diet texture modifications as tolerated -SLP consulted on 11/01; noted that RN denied signs of aspirations and SLP recommended to implement nectar thick liquids as warranted -Weight trends indicate pt gained 8 lbs since 05/2014; however pt with nonpitting RLE and LLE which is likely impacting weight -Currently NPO for bronch  11/05: -Pt underwent MBS on 11/03, SLP expressed concern for high aspiration risk and pt's fatigue with 5 tsps intake of barium solution -Family  willing to initiate short term enteral feedings, SLP to follow and reassess swallow function -NGT placed on 11/05  -WOC eval on 11/02; noted 2nd and 3rd toes gangrenous and stg 2 decub sacral ulcer; minimal drainage -CBG's elevated, likely r/t to inflammatory response  Height: Ht Readings from Last 1 Encounters:  07/28/14 5\' 1"  (1.549 m)    Weight Status:   Wt Readings from Last 1 Encounters:  08/02/14 136 lb 3.2 oz (61.78 kg)  07/30/14 146 lb  Re-estimated needs (from new weight):  Kcal: 1500-1700 Protein: 80-95 gram Fluid: >/= 1600 ml daily  Skin: stage 2 PU on buttock, gangreneous toes on right foot  Diet Order: Diet NPO time specified Except for: Ice Chips   Intake/Output Summary (Last 24 hours) at 08/02/14 1014 Last data filed at 08/02/14 0800  Gross per 24 hour  Intake     50 ml  Output    600 ml  Net   -550 ml    Last BM: 11/01   Labs:   Recent Labs Lab 07/30/14 0805 07/31/14 0340 08/01/14 0310  NA 133* 130* 136*  K 4.1 4.3 4.2  CL 95* 94* 96  CO2 25 24 25   BUN 13 12 15   CREATININE 0.66 0.67 0.62  CALCIUM 8.5 8.6 8.5  GLUCOSE 108* 109* 178*    CBG (last 3)   Recent Labs  07/31/14 1757 08/01/14 0227  GLUCAP 133* 171*    Scheduled Meds: . antiseptic oral rinse  7 mL Mouth Rinse q12n4p  . aspirin  150 mg Rectal Daily  . butamben-tetracaine-benzocaine  1 spray Topical Once  .  chlorhexidine  15 mL Mouth Rinse BID  . clindamycin (CLEOCIN) IV  600 mg Intravenous 3 times per day  . enoxaparin (LOVENOX) injection  40 mg Subcutaneous Q24H  . fluconazole (DIFLUCAN) IV  100 mg Intravenous Daily  . folic acid  1 mg Intravenous Daily  . lidocaine  1 application Topical Once  . methylPREDNISolone (SOLU-MEDROL) injection  20 mg Intravenous Daily  . metoprolol  5 mg Intravenous Q12H  . pantoprazole (PROTONIX) IV  40 mg Intravenous Q12H  . polyvinyl alcohol  1 drop Both Eyes Daily  . sodium chloride  3 mL Intravenous Q12H    Continuous Infusions:    Atlee Abide MS RD LDN Clinical Dietitian [phone]

## 2014-08-02 NOTE — Progress Notes (Signed)
[facility] Progress Note  Subjective: Patient currently with no subjective complaints. She had an NG tube placed instead of panda  Objective: Vital signs in last 24 hours: Temp:  [97.6 F (36.4 C)-98.9 F (37.2 C)] 97.9 F (36.6 C) (11/05 1159) Pulse Rate:  [78-83] 80 (11/05 1400) Resp:  [17-25] 25 (11/05 1200) BP: (122-165)/(43-81) 144/57 mmHg (11/05 1400) SpO2:  [93 %-98 %] 95 % (11/05 1400) Weight:  [61.78 kg (136 lb 3.2 oz)] 61.78 kg (136 lb 3.2 oz) (11/05 0500) Weight change: -1.42 kg (-3 lb 2.1 oz)   IR:SWNIOEVO exam not done today.  Lab Results: Results for orders placed or performed during the hospital encounter of 07/28/14 (from the past 24 hour(s))  Glucose, capillary     Status: Abnormal   Collection Time: 08/02/14 11:53 AM  Result Value Ref Range   Glucose-Capillary 142 (H) 70 - 99 mg/dL    Studies/Results: Dg Abd 1 View  08/02/2014   CLINICAL DATA:  Nasogastric tube placement.  EXAM: ABDOMEN - 1 VIEW  COMPARISON:  CT abdomen and pelvis 05/21/2012.  Chest CT 07/30/2014.  FINDINGS: Enteric tube is looped over the stomach with distal end directed cranially and tip projecting over the lower mediastinum. Recent chest CT demonstrates a hiatal hernia in this location. Oral contrast material is present in the ascending and transverse colon. No dilated loops of bowel are seen to suggest obstruction. There is chronic elevation of the right hemidiaphragm. Dense mitral annular calcifications partially visualized. Mild lumbar levoscoliosis is noted.  IMPRESSION: Enteric tube looped in the stomach with tip directed cranially over the lower mediastinum, likely in the gastric fundus in the herniated portion of the stomach. Consider retracting 10-15 cm to reduce the amount of excess tube in the stomach and place the tip in the gastric body.  These results will be called to the ordering clinician or representative by the Radiologist Assistant, and communication documented in the  PACS or zVision Dashboard.   Electronically Signed   By: Logan Bores   On: 08/02/2014 10:35      Assessment: Oropharyngeal dysphagia by previous speech pathology evaluation with frank aspiration demonstratedPlan: As per yesterday's note. Do not think she would benefit from endoscopy at this point given the point of origin of aspiration.. Once panda tube placed Will begin feedings. A repeat swallowing evaluation can be done at some point to see if deficits improve.we will continue to follow.    Barry Culverhouse C 08/02/2014, 4:30 PM

## 2014-08-02 NOTE — Progress Notes (Signed)
Called by Dr. Amedeo Plenty to check on pt's condition. Notified Dr. Amedeo Plenty that pt was doing well at this time and the family was doing well also. Dr. Amedeo Plenty notified that the panda tube is in place at this time but is signaling occluded per the [facility] pump and when manual flushes attempted nothing is able to pass through. No additional orders given at this time by Dr. Amedeo Plenty.

## 2014-08-02 NOTE — Progress Notes (Signed)
Occupational Therapy Treatment Patient Details Name: Martha Mcbride MRN: [id number] DOB: [birth year] Today's Date: 08/02/2014    History of present illness 80 yo female admitted with sepsis, hypoxia. Hx of weakness, polymyalgia rheumatica, breast cancer, R foot ischemic toes. Recent d/c from [place] on 10/26. Pt is from home with family.    OT comments  Pt tolerated session well.  2 granddaughters present this pm.    Follow Up Recommendations  Home health OT;Supervision/Assistance - 24 hour    Equipment Recommendations  None recommended by OT    Recommendations for Other Services      Precautions / Restrictions Precautions Precautions: Fall       Mobility Bed Mobility           Sit to supine: Min assist   General bed mobility comments: assist for trunk  Transfers   Equipment used: Rolling walker (2 wheeled) Transfers: Sit to/from Stand Sit to Stand: Min assist         General transfer comment: Assist to rise, stabilize, control descent.     Balance                                   ADL                           Toilet Transfer: Minimal assistance;Stand-pivot;BSC   Toileting- Clothing Manipulation and Hygiene: Sit to/from stand;Total assistance         General ADL Comments: transferred to 3:1 then to recliner:  granddaughters with pt.        Vision                     Perception     Praxis      Cognition   Behavior During Therapy: [facility] for tasks assessed/performed Overall Cognitive Status: Within Functional Limits for tasks assessed                       Extremity/Trunk Assessment               Exercises Other Exercises Other Exercises: level one theraband for shoulder flexion/extension and elbow flexion/extension.  Pt also squeezed balll.  All exercises x 10 each arm   Shoulder Instructions       General Comments      Pertinent Vitals/ Pain       Pain Assessment: No/denies pain  Home  Living                                          Prior Functioning/Environment              Frequency Min 2X/week     Progress Toward Goals  OT Goals(current goals can now be found in the care plan section)  Progress towards OT goals: Progressing toward goals     Plan      Co-evaluation                 End of Session     Activity Tolerance Patient tolerated treatment well   Patient Left in chair;with call bell/phone within reach;with family/visitor present   Nurse Communication          Time: 2023-3435 OT Time Calculation (min): 37 min  Charges: OT  General Charges $OT Visit: 1 Procedure OT Treatments $Self Care/Home Management : 8-22 mins $Therapeutic Exercise: 8-22 mins  Sumiya Mamaril 08/02/2014, 1:29 PM  Lesle Chris, OTR/L [phone] 08/02/2014

## 2014-08-02 NOTE — Progress Notes (Addendum)
SLP Cancellation Note  Patient Details Name: Martha Mcbride MRN: [id number] DOB: [birth year]   Cancelled treatment:       Reason Eval/Treat Not Completed:  (pt currently has large bore NG tube and place which will not be conducive to exercises as it may compromise epiglottic deflection, will return next date for review of functional swallowing exercises with pt and family)  RN informed.   Luanna Salk, [facility] SLP [phone]

## 2014-08-02 NOTE — Progress Notes (Addendum)
[facility] PROGRESS NOTE  [name] XBM:[id number] DOB: 1925/06/29 DOA: 07/28/2014 PCP: Simona Huh, MD  Assessment/Plan: 80 y.o. female with PMH significant for Polymyalgia on prednisone, severely calcified mitral valve with moderate stenosis, chronic diastolic heart failure,Gangrenous toes on the right foot , last admission 10-27 was treated for health care associated PNA, encephalopathy, and diastolic HF exacerbation.   She was brought to the emergency department on 10-31  for worsening shortness of breath  and hypoxia as noted by a home pulse oximeter that the daughter has. Per daughter patient has been having problem of low oxygen level at night during sleep. She was trying to arrange home oxygen trough patient 's  PCP but was unsuccessful.    Evaluation in the ED: Patient was found to be febrile, with tempeture at 103. Per family patient has been more confuse lately.  Patient was admitted with Acute on Chronic Respiratory failure, thought to be secondary to IPF and component of aspiration PNA. No need for inpatient Bronchoscopy. Family was having concern regarding patient ' presume history of parasitic infection causing Loeffler diseases. ID was consulted. ID think unlikely parasitic infection. Records still pending.   Patient was found to have Moderate oral phase dysphagia and moderate Pharyngeal dysphagia. GI consulted for further evaluation. Family has decide to proceed with NGT for nutrition and reassessment of swallow evaluation in few days.   Plan of care discussed with Family today:                                                               -Stop clindamycin in 48 hours.   -Discharge home in 48 hours if stable.   -Per discussion with Dr Amedeo Plenty, Patient can be discharge with panda tube,his                                                   office can help with follow up for repeat swallow evaluation and further                                                           prescription for nutrition.   -I have ordered, Home health: RN, PT, OT, Aid. Patient need Home oxygen.   -Discussed with [name], granddaughter, will hold on getting MRI.                                                                -Will follow nutritionist recommendation for tube feeding at discharge.  -Regarding repeating  CT Chest in 2 to 3 weeks, will defer to Dr Elsworth Soho expertise.   I explain to [name], granddaughter, today recommendation from Dr Elsworth Soho: Bronchoscopy wont change management, no need for bronchoscopy during this admission. Follow up with Dr.  Alva Nov 17 at 10 AM. Elizebeth Koller treatment for aspiration PNA. Continue with Solumedrol 20 mg IV and transition to oral prednisone at time of discharge.    -Sepsis: Patient present with fever, BP in the 100 to 90 range, Lactic acid at 3.5. Chest x ray with persistent infiltrates. WBC at 12.  -Vancomycin and cefepime discontinue. Unlikely Health care associated per ID.  -Treating for Aspiration PNA.  -Blood culture no growth to date. .  -UA with trace leukocytes.  urine culture Staph coagulase negative.  -Lactic acid decrease to 2.4---from 3.5.   -Acute Hypoxic Respiratory Failure. IPF,  Aspiration PNA.  Chest x ray 11-01 showed worsening infiltrates: pulmonary edema, Pneumonitis.  CCM consulted for further evaluation of pneumonitis.   Received cefepime for 3 days. Started on Clindamycin 11-03 to cover for aspiration PNA.  Continue with treatment of PNA.  Patient will benefit form home oxygen, specially at night.  Sputum culture normal oro pharyngeal flora.  I discussed with Dr Linus Salmons, ok to use steroids.  Discussed with Dr Unknown Jim no need for inpatient bronchoscopy. Family can follow up out patient if they whish.  Discussed with Dr Elsworth Soho no need for inpatient bronchoscopy, ok to use steroid, need prednisone taper dose. See Dr Elsworth Soho recommendation.  IV solumedrol 20 mg IV daily. Discharge on home dose prednisone.   -Dysphagia, oropharyngeal,  component of cervical esophageal;  -Family Agree with tube feeding. Will need to reassess swallow.  -Dr Amedeo Plenty following.  -Will need to transition to oral medications when tolerating tube feedings.   History of giardia, Ascaris Lumbricoids: didn't finish treatment. 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 \ ID consulted for treatment recommendation and further evaluation. Of note patient had stool culture negative for parasites last admission.  -Ova and parasite stool negative.  -strongyloids antibody negative.  -Quantiferon Tb negative -Records pending.  -Per ID unlikely parasitic infection.   -Chronic Diastolic HF; Appears compensated.   BNP at 576, last admission at 1467. Patient appears compensated.  Chest x ray with worsening infiltrates, edema, pneumonitis.  Will hold IV lasix today due to NPO status. Will probably need to resume oral lasix soon.   Stable. Negative 5 L.   -Polymyalgia Rheumatica:  On prednisone 5 mg daily. Change to IV due to NPO status.  leflunomide was discontinue during last admission.   -Gangrenous toes on the right foot  -Unclear etiology at this time, pulses on the right foot palpable with good perfusion  -Follow up with vascular outpatient.  -stable.  -wound care following. Local care.   -Decubitus ulcer  Wound care consulted. Appreciate recommendation.   -Malnutrition; nutrition consulted for tube feeding.   Appreciate consultants help.   Code Status: Full Code.  Family Communication: Care discussed with Daughters.  Plan of care discussed with Family: -Stop clindamycin in 48 hours.                                                             -Discharge home in 48 hours if stable.                                                             -  Per discussion with Dr Amedeo Plenty, Patient can be discharge with panda tube,his                                                            office can help with follow up for repeat swallow evaluation and  further                                                                         prescription for nutrition.                                                              -I have ordered, Home health: RN, PT, OT, Aid. Patient need Home oxygen.                                                               -Discussed with [name], granddaughter, will hold on getting MRI.                                                                  Disposition Plan: Remain in the step down unit.    Consultants:  Pulmonary  ID.   Procedures:  none  Antibiotics:  Vancomycin 10-31---11-02  Cefepime 10-31----11-03  Clindamycin 11-03  HPI/Subjective: Patient is alert, sitting in the  bed. Denies chest pain and dyspnea.  She agree with her daughters decision regarding tube feeding.   Objective: Filed Vitals:   08/02/14 0420  BP: 133/60  Pulse: 81  Temp: 97.6 F (36.4 C)  Resp: 20    Intake/Output Summary (Last 24 hours) at 08/02/14 0832 Last data filed at 08/02/14 0646  Gross per 24 hour  Intake     50 ml  Output    750 ml  Net   -700 ml   Filed Weights   07/31/14 0400 08/01/14 0500 08/02/14 0500  Weight: 63.2 kg (139 lb 5.3 oz) 63.2 kg (139 lb 5.3 oz) 61.78 kg (136 lb 3.2 oz)    Exam:   General:  No distress.   Cardiovascular: S 1, S 2 RRR  Respiratory:Bilateral crackles.   Abdomen: BS present, soft. Nt  Musculoskeletal: no edema.   Data Reviewed: Basic Metabolic Panel:  Recent Labs Lab 07/28/14 1225 07/29/14 0330 07/30/14 0805 07/31/14 0340 08/01/14 0310  NA 130* 131* 133* 130* 136*  K 4.0 3.3* 4.1 4.3 4.2  CL 90* 94* 95* 94* 96  CO2 21 25 25 24 25   GLUCOSE 206* 162* 108* 109* 178*  BUN 17 12 13 12 15   CREATININE 0.70 0.55 0.66 0.67 0.62  CALCIUM 8.5 8.2* 8.5 8.6 8.5   Liver Function Tests:  Recent Labs Lab 07/28/14 1225  AST 18  ALT 24  ALKPHOS 119*  BILITOT 0.8  PROT 6.5  ALBUMIN 2.8*   No results for input(s): LIPASE, AMYLASE in the last 168  hours. No results for input(s): AMMONIA in the last 168 hours. CBC:  Recent Labs Lab 07/28/14 1225 07/29/14 0330 07/30/14 0805 07/31/14 0340 08/01/14 0310  WBC 12.5* 8.5 9.7 9.3 5.9  NEUTROABS 8.4*  --   --   --   --   HGB 12.7 11.2* 12.6 12.5 12.5  HCT 37.7 32.4* 37.3 37.5 37.7  MCV 87.5 85.9 86.5 86.6 87.7  PLT 232 208 212 188 167   Cardiac Enzymes:  Recent Labs Lab 07/28/14 1225 07/30/14 1055  TROPONINI <0.30 <0.30   BNP (last 3 results)  Recent Labs  07/19/14 2039 07/28/14 1225 07/29/14 0330  PROBNP 1467.0* 576.5* 753.4*   CBG:  Recent Labs Lab 07/30/14 0520 07/31/14 1757 08/01/14 0227  GLUCAP 114* 133* 171*    Recent Results (from the past 240 hour(s))  Blood culture (routine x 2)     Status: None (Preliminary result)   Collection Time: 07/28/14 12:21 PM  Result Value Ref Range Status   Specimen Description BLOOD LEFT WRIST  4 ML IN [facility] BOTTLE  Final   Special Requests NONE  Final   Culture  Setup Time   Final    07/28/2014 20:11 Performed at [facility]    Culture   Final           BLOOD CULTURE RECEIVED NO GROWTH TO DATE CULTURE WILL BE HELD FOR 5 DAYS BEFORE ISSUING A FINAL NEGATIVE REPORT Performed at [facility]    Report Status PENDING  Incomplete  Blood culture (routine x 2)     Status: None (Preliminary result)   Collection Time: 07/28/14 12:49 PM  Result Value Ref Range Status   Specimen Description BLOOD BLOOD LEFT FOREARM  Final   Special Requests BOTTLES DRAWN AEROBIC AND ANAEROBIC 5ML  Final   Culture  Setup Time   Final    07/28/2014 20:10 Performed at [facility]    Culture   Final           BLOOD CULTURE RECEIVED NO GROWTH TO DATE CULTURE WILL BE HELD FOR 5 DAYS BEFORE ISSUING A FINAL NEGATIVE REPORT Performed at [facility]    Report Status PENDING  Incomplete  Urine culture     Status: None   Collection Time: 07/28/14  2:03 PM  Result Value Ref Range Status   Specimen Description  URINE, CLEAN CATCH  Final   Special Requests NONE  Final   Culture  Setup Time   Final    07/29/2014 02:28 Performed at [facility]   Final    30,000 COLONIES/ML Performed at [facility]    Culture   Final    STAPHYLOCOCCUS SPECIES (COAGULASE NEGATIVE) Note: RIFAMPIN AND GENTAMICIN SHOULD NOT BE USED AS SINGLE DRUGS FOR TREATMENT OF STAPH INFECTIONS. Performed at [facility]    Report Status 07/31/2014 FINAL  Final   Organism ID, Bacteria STAPHYLOCOCCUS SPECIES (COAGULASE NEGATIVE)  Final      Susceptibility   Staphylococcus species (coagulase negative) - MIC*  GENTAMICIN <=0.5 SENSITIVE Sensitive     LEVOFLOXACIN >=8 RESISTANT Resistant     NITROFURANTOIN <=16 SENSITIVE Sensitive     OXACILLIN >=4 RESISTANT Resistant     PENICILLIN >=0.5 RESISTANT Resistant     RIFAMPIN <=0.5 SENSITIVE Sensitive     TRIMETH/SULFA <=10 SENSITIVE Sensitive     VANCOMYCIN 1 SENSITIVE Sensitive     TETRACYCLINE <=1 SENSITIVE Sensitive     * STAPHYLOCOCCUS SPECIES (COAGULASE NEGATIVE)  MRSA PCR Screening     Status: None   Collection Time: 07/28/14  3:52 PM  Result Value Ref Range Status   MRSA by PCR NEGATIVE NEGATIVE Final    Comment:        The GeneXpert MRSA Assay (FDA approved for NASAL specimens only), is one component of a comprehensive MRSA colonization surveillance program. It is not intended to diagnose MRSA infection nor to guide or monitor treatment for MRSA infections.  Culture, expectorated sputum-assessment     Status: None   Collection Time: 07/28/14  4:21 PM  Result Value Ref Range Status   Specimen Description SPUTUM  Final   Special Requests Immunocompromised  Final   Sputum evaluation   Final    THIS SPECIMEN IS ACCEPTABLE. RESPIRATORY CULTURE REPORT TO FOLLOW.   Report Status 07/28/2014 FINAL  Final  Culture, respiratory (NON-Expectorated)     Status: None   Collection Time: 07/28/14  4:21 PM  Result Value Ref Range  Status   Specimen Description SPUTUM  Final   Special Requests NONE  Final   Gram Stain   Final    NO WBC SEEN FEW SQUAMOUS EPITHELIAL CELLS PRESENT MODERATE GRAM POSITIVE RODS FEW GRAM NEGATIVE RODS FEW GRAM POSITIVE COCCI IN PAIRS    Culture   Final    NORMAL OROPHARYNGEAL FLORA Performed at [facility]    Report Status 07/31/2014 FINAL  Final  Ova and parasite examination     Status: None   Collection Time: 07/29/14  7:28 PM  Result Value Ref Range Status   Specimen Description STOOL  Final   Special Requests NONE  Final   Ova and parasites   Final    NO OVA OR PARASITES SEEN NO STRONGYLOIDES STERCORALIS SEEN Performed at [facility]    Report Status 07/31/2014 FINAL  Final     Studies: Dg Swallowing Func-speech Pathology  07/31/2014   Macario Golds, CCC-SLP     07/31/2014  1:52 PM Objective Swallowing Evaluation: Modified Barium Swallowing Study   Patient Details  Name: Martha Mcbride MRN: [id number] Date of Birth: 22-Oct-1924  Today's Date: 07/31/2014 Time: 1245-1315 SLP Time Calculation (min): 30 min  Past Medical History:  Past Medical History  Diagnosis Date  . Arthritis   . Polymyalgia   . Acid reflux   . Immune deficiency disorder     autoimmune disorder - polymyalgia - on prednisone  . Breast cancer     Treated with lumpectomy and radiation  . Giardia   . Pinworms    Past Surgical History:  Past Surgical History  Procedure Laterality Date  . Breast lumpectomy    . Partial hysterectomy    . Tonsillectomy    . Tee without cardioversion N/A 07/20/2014    Procedure: TRANSESOPHAGEAL ECHOCARDIOGRAM (TEE);  Surgeon: Candee Furbish, MD;  Location: [facility] ENDOSCOPY;  Service: Cardiovascular;   Laterality: N/A;   HPI:  80 y/o female with polymyalgia on chronic prednisone (5mg ) and  leflunomide admitted on 10/31 to [facility] with acute hypoxemic  respiratory  failure and diffuse bilateral infiltrates per MD  note.  Pt also with recent Eosinophilia, CHF, acid reflux,  parasitic  diagnosis - giardia, pinworm, roundworms.  Order for  swallow evaluation received and daughter [name] questioned if pt  may dysphagia.  Pt denies dysphagia but admits to "memory  problem" and occasional choking.  She does take her pills with  applesauce premorbidly - daughter [name] via speaker phone stated  this was only due to pt consuming a full cup of water with pills  and complaining of being full - therefore [name] recommended pt  take pills with applesauce.  CT Chest 11/2 showed "Evidence of  emphysema with traction bronchiectasis in multiple areas of  interstitial fibrosis. This appearance is consistent with  underlying usual interstitial pneumonitis. There are areas of  patchy consolidation in both lower lobes as well as to a lesser  extent in the upper lobes. There also moderate pleural effusions.  The areas of consolidation are felt to most likely represent  superimposed pneumonia. There may be a degree of superimposed  congestive heart failure as well given the pleural effusions. 5  mm nodular opacity in the posterior segment of the right upper  lobe.  Areas of atherosclerotic change and cardiac valvular  calcification."     Assessment / Plan / Recommendation Clinical Impression  Dysphagia Diagnosis: Moderate oral phase dysphagia;Moderate  pharyngeal phase dysphagia;Moderate cervical esophageal phase  dysphagia  Clinical impression: Moderate oropharyngeal and suspected  cervical esophageal dysphagia with + aspiration or penetration  with each consistency tested.  Sensorimotor deficits noted  impacting swallow ability.   Premature spillage of boluses into pharynx noted d/t weakness and  compromised laryngeal elevation resulted in + aspiration of  nectar *mixing with secretions* before the swallow.  She senses  penetration/aspiration intermittently and can expectorate but was  noted to be excessively fatigued and mildly dyspenic after just a  few tsps of intake.  Further boluses were re-penetrated or  aspirated.   Moderate vallecular residuals also noted with pudding  and cracker more than liquids WITHOUT pt awareness.  Pt also is  aspirating secretions that mix with barium.     Pt is at high aspiration risk currently and recommend she be NPO  except small single ice chips after oral care.  ? If aspiration  could be contributing to pt's fevers, pneumonia.  Daughter [name]  present during testing and both pt/daughter educated to findings.     Further informed [name] that feeding tubes do not prevent  aspiration.  SLP uncertain of pt's swallow prognosis given ?  source of oropharyngeal dysphagia and pt's deconditioning and  ongoing illness x2 months per [name]. Will follow up to help with  care plan for to determine readiness for repeat evaluation if  indicated.     Treatment Recommendation  Therapy as outlined in treatment plan below    Diet Recommendation NPO;Ice chips PRN after oral care (single  small ice chips after oral care pending plans)        Other  Recommendations Oral Care Recommendations: Oral care Q4  per protocol   Follow Up Recommendations    TBD   Frequency and Duration min 1 x/week  1 week   Pertinent Vitals/Pain Febrile, congested     General Date of Onset: 07/30/14 HPI: 80 y/o female with polymyalgia on chronic prednisone (5mg )  and leflunomide admitted on 10/31 to [facility] with acute hypoxemic  respiratory failure and diffuse bilateral infiltrates per MD  note.  Pt also with  recent Eosinophilia, CHF, acid reflux,  parasitic diagnosis - giardia, pinworm, roundworms.  Order for  swallow evaluation received and daughter [name] questioned if pt  may dysphagia.  Pt denies dysphagia but admits to "memory  problem" and occasional choking.  She does take her pills with  applesauce premorbidly - daughter [name] via speaker phone stated  this was only due to pt consuming a full cup of water with pills  and complaining of being full - therefore [name] recommended pt  take pills with applesauce.  CT Chest 11/2 showed "Evidence of   emphysema with traction bronchiectasis in multiple areas of  interstitial fibrosis. This appearance is consistent with  underlying usual interstitial pneumonitis. There are areas of  patchy consolidation in both lower lobes as well as to a lesser  extent in the upper lobes. There also moderate pleural effusions.  The areas of consolidation are felt to most likely represent  superimposed pneumonia. There may be a degree of superimposed  congestive heart failure as well given the pleural effusions. 5  mm nodular opacity in the posterior segment of the right upper  lobe.  Areas of atherosclerotic change and cardiac valvular  calcification." Type of Study: Modified Barium Swallowing Study Reason for Referral: Objectively evaluate swallowing function  (rule out overt aspiration) Diet Prior to this Study: Thin liquids Temperature Spikes Noted: Yes Respiratory Status: Room air History of Recent Intubation: No Behavior/Cognition: Alert;Cooperative;Pleasant mood Oral Cavity - Dentition: Dentures, top (lower partial) Oral Motor / Sensory Function: Impaired - see Bedside swallow  eval Self-Feeding Abilities: Able to feed self;Needs set up Patient Positioning: Upright in chair Baseline Vocal Quality: Clear [name] reports voice will change  intermittently  - likely due to excessive coughing) Volitional Cough: Strong (productive) Volitional Swallow: Able to elicit Anatomy: Within functional limits Pharyngeal Secretions: Not observed secondary MBS    Reason for Referral Objectively evaluate swallowing function  (rule out overt aspiration)   Oral Phase Oral Preparation/Oral Phase Oral Phase: Impaired Oral - Honey Oral - Honey Teaspoon: Reduced posterior  propulsion;Lingual/palatal residue;Weak lingual manipulation Oral - Nectar Oral - Nectar Teaspoon: Reduced posterior  propulsion;Lingual/palatal residue;Weak lingual manipulation Oral - Thin Oral - Thin Teaspoon: Reduced posterior propulsion;Weak lingual  manipulation Oral - Thin  Cup: Reduced posterior propulsion;Lingual/palatal  residue;Weak lingual manipulation Oral - Solids Oral - Puree: Reduced posterior propulsion;Lingual/palatal  residue;Weak lingual manipulation Oral - Regular: Reduced posterior propulsion;Weak lingual  manipulation Oral Phase - Comment Oral Phase - Comment: premature spillage of barium into pharynx  with decreased control   Pharyngeal Phase Pharyngeal Phase Pharyngeal Phase: Impaired Pharyngeal - Honey Pharyngeal - Honey Teaspoon: Premature spillage to  valleculae;Reduced anterior laryngeal mobility;Reduced laryngeal  elevation;Reduced airway/laryngeal closure;Reduced tongue base  retraction;Reduced epiglottic inversion;Reduced pharyngeal  peristalsis;Penetration/Aspiration before swallow;Pharyngeal  residue - valleculae;Pharyngeal residue - cp segment Penetration/Aspiration details (honey teaspoon): Material enters  airway, CONTACTS cords and not ejected out Pharyngeal - Nectar Pharyngeal - Nectar Teaspoon: Premature spillage to  valleculae;Reduced anterior laryngeal mobility;Reduced epiglottic  inversion;Reduced airway/laryngeal closure;Reduced laryngeal  elevation;Reduced tongue base retraction;Reduced pharyngeal  peristalsis;Trace aspiration;Penetration/Aspiration before  swallow;Penetration/Aspiration during swallow;Pharyngeal residue  - cp segment Penetration/Aspiration details (nectar teaspoon): Material enters  airway, passes BELOW cords without attempt by patient to eject  out (silent aspiration) Pharyngeal - Thin Pharyngeal - Thin Teaspoon: Reduced epiglottic inversion;Reduced  laryngeal elevation;Reduced airway/laryngeal closure;Reduced  anterior laryngeal mobility;Reduced pharyngeal  peristalsis;Pharyngeal residue - cp segment Pharyngeal - Thin Cup: Reduced anterior laryngeal  mobility;Reduced epiglottic inversion;Premature spillage to  pyriform sinuses;Reduced laryngeal elevation;Reduced  airway/laryngeal closure;Reduced pharyngeal peristalsis;Trace   aspiration;Penetration/Aspiration before swallow;Pharyngeal  residue - valleculae;Pharyngeal residue - cp segment Penetration/Aspiration details (thin cup): Material enters  airway, CONTACTS cords and not ejected out;Material enters  airway, passes BELOW cords without attempt by patient to eject  out (silent aspiration) Pharyngeal - Solids Pharyngeal - Puree: Reduced pharyngeal peristalsis;Reduced  airway/laryngeal closure;Reduced epiglottic inversion;Reduced  tongue base retraction;Reduced laryngeal elevation;Premature  spillage to valleculae;Penetration/Aspiration before  swallow;Pharyngeal residue - cp segment Penetration/Aspiration details (puree): Material enters airway,  remains ABOVE vocal cords then ejected out Pharyngeal - Regular: Reduced tongue base retraction;Reduced  epiglottic inversion;Reduced anterior laryngeal  mobility;Premature spillage to valleculae;Reduced laryngeal  elevation;Reduced airway/laryngeal closure;Reduced pharyngeal  peristalsis;Pharyngeal residue - valleculae;Pharyngeal residue -  cp segment Pharyngeal Phase - Comment Pharyngeal Comment: pt without awareness to pharyngeal residuals  mixing with barium/secretions, cued dry swallows marginally  effective to decrease residuals, pt able to expectorate to clear  but caused significant fatigue for pt after just a few boluses,  chin tuck posture not effective (pt unable to fully conduct due  to neck discomfort)   Cervical Esophageal Phase    GO    Cervical Esophageal Phase Cervical Esophageal Phase: Impaired Cervical Esophageal Phase - Honey Honey Teaspoon: Reduced cricopharyngeal relaxation Cervical Esophageal Phase - Nectar Nectar Teaspoon: Reduced cricopharyngeal relaxation Cervical Esophageal Phase - Thin Thin Teaspoon: Reduced cricopharyngeal relaxation Thin Cup: Reduced cricopharyngeal relaxation Cervical Esophageal Phase - Solids Puree: Reduced cricopharyngeal relaxation Regular: Reduced cricopharyngeal relaxation Cervical  Esophageal Phase - Comment Cervical Esophageal Comment: appearance of barium residuals in  mid-esophagus, cued dry swallow nor liquid swallows effective to  clear, radiologist not present to confirm and pt could not  undergo esophagram due to aspiration         Luanna Salk, MS [facility] SLP [phone]     Scheduled Meds: . antiseptic oral rinse  7 mL Mouth Rinse q12n4p  . aspirin  150 mg Rectal Daily  . butamben-tetracaine-benzocaine  1 spray Topical Once  . chlorhexidine  15 mL Mouth Rinse BID  . clindamycin (CLEOCIN) IV  600 mg Intravenous 3 times per day  . enoxaparin (LOVENOX) injection  40 mg Subcutaneous Q24H  . fluconazole (DIFLUCAN) IV  100 mg Intravenous Daily  . folic acid  1 mg Intravenous Daily  . lidocaine  1 application Topical Once  . methylPREDNISolone (SOLU-MEDROL) injection  20 mg Intravenous Daily  . metoprolol  5 mg Intravenous Q12H  . pantoprazole (PROTONIX) IV  40 mg Intravenous Q12H  . polyvinyl alcohol  1 drop Both Eyes Daily  . sodium chloride  3 mL Intravenous Q12H   Continuous Infusions:   Principal Problem:   Sepsis Active Problems:   Hyponatremia   Immune disorder   Fever   Diastolic HF (heart failure)   Chronic diastolic heart failure   Acute respiratory failure with hypoxemia   Dyspnea   Pyrexia   Hypoxemia   Pulmonary infiltrate   Malnutrition of moderate degree   Aspiration into airway    Time spent: 35 minutes.     Niel Hummer A  [facility] Pager [phone]. If 7PM-7AM, please contact night-coverage at [email or web address], password [facility] 08/02/2014, 8:32 AM  LOS: 5 days

## 2014-08-02 NOTE — Progress Notes (Signed)
Notified K. Kirby,NP that pt has a panda tube in  place in which manual flushes were attempted by two nurses without any success. Pt currently has Vital @ 20 infusing via Kangaroo pump, which has not signaled occlusion at this time. Martha Mcbride states that if panda is occluded, tube feedings can be stopped and IVF fluids can started during the night until this issue can be addressed in the morning.

## 2014-08-03 DIAGNOSIS — R627 Adult failure to thrive: Secondary | ICD-10-CM

## 2014-08-03 DIAGNOSIS — T17998D Other foreign object in respiratory tract, part unspecified causing other injury, subsequent encounter: Secondary | ICD-10-CM

## 2014-08-03 DIAGNOSIS — IMO0001 Reserved for inherently not codable concepts without codable children: Secondary | ICD-10-CM | POA: Insufficient documentation

## 2014-08-03 LAB — BASIC METABOLIC PANEL
Anion gap: 15 (ref 5–15)
BUN: 24 mg/dL — AB (ref 6–23)
CHLORIDE: 102 meq/L (ref 96–112)
CO2: 25 meq/L (ref 19–32)
Calcium: 8.5 mg/dL (ref 8.4–10.5)
Creatinine, Ser: 0.63 mg/dL (ref 0.50–1.10)
GFR calc Af Amer: >90 mL/min (ref >90)
GFR calc non Af Amer: 78 mL/min — ABNORMAL LOW (ref >90)
Glucose, Bld: 103 mg/dL — ABNORMAL HIGH (ref 70–99)
POTASSIUM: 3.8 meq/L (ref 3.7–5.3)
Sodium: 142 mEq/L (ref 137–147)

## 2014-08-03 LAB — CBC
HEMATOCRIT: 37.3 % (ref 36.0–46.0)
HEMOGLOBIN: 11.9 g/dL — AB (ref 12.0–15.0)
MCH: 28.5 pg (ref 26.0–34.0)
MCHC: 31.9 g/dL (ref 30.0–36.0)
MCV: 89.2 fL (ref 78.0–100.0)
Platelets: 194 10*3/uL (ref 150–400)
RBC: 4.18 MIL/uL (ref 3.87–5.11)
RDW: 18.5 % — ABNORMAL HIGH (ref 11.5–15.5)
WBC: 6.9 10*3/uL (ref 4.0–10.5)

## 2014-08-03 LAB — CULTURE, BLOOD (ROUTINE X 2)
Culture: NO GROWTH
Culture: NO GROWTH

## 2014-08-03 LAB — GLUCOSE, CAPILLARY
GLUCOSE-CAPILLARY: 119 mg/dL — AB (ref 70–99)
GLUCOSE-CAPILLARY: 93 mg/dL (ref 70–99)
GLUCOSE-CAPILLARY: 98 mg/dL (ref 70–99)
Glucose-Capillary: 119 mg/dL — ABNORMAL HIGH (ref 70–99)
Glucose-Capillary: 128 mg/dL — ABNORMAL HIGH (ref 70–99)
Glucose-Capillary: 174 mg/dL — ABNORMAL HIGH (ref 70–99)

## 2014-08-03 LAB — MAGNESIUM: Magnesium: 2.6 mg/dL — ABNORMAL HIGH (ref 1.5–2.5)

## 2014-08-03 MED ORDER — CLINDAMYCIN PHOSPHATE 600 MG/50ML IV SOLN
600.0000 mg | Freq: Three times a day (TID) | INTRAVENOUS | Status: AC
  Administered 2014-08-03 – 2014-08-04 (×2): 600 mg via INTRAVENOUS
  Filled 2014-08-03 (×2): qty 50

## 2014-08-03 MED ORDER — VITAL AF 1.2 CAL PO LIQD
1000.0000 mL | ORAL | Status: DC
  Administered 2014-08-04: 1000 mL
  Filled 2014-08-03 (×6): qty 1000

## 2014-08-03 NOTE — Progress Notes (Signed)
Nutrition Follow Up  Met with daughter due to her questions about patient's TF.  Chart reviewed and discussed with unit RD. Per daughter patient to be discharged home with panda tube feeding and therapy to work to improve swallow function.   If swallow function does not improve then plans for PEG.  Daughter reported knowledge of continued aspiration risk with feeding tubes.  RD explained these increased risks with panda at home. Per daughter, she will discharge to home under care of daughter.  [facility] to provide [place] and DME needs.   Answered questions about TF, calorie and protein requirements, and where to obtain formula.  ([facility] can provide formula and follow up with RD at [place] as needed.)    After TF tolerated well at Goal rate (Vital AF 1.2 at 50 ml/hr), recommend increasing rate to 60 ml/hr for 20 hours daily.  RD to continue to follow.  Antonieta Iba, RD, LDN Clinical Inpatient Dietitian Pager:  [phone] Weekend and after hours pager:  [phone]

## 2014-08-03 NOTE — Progress Notes (Signed)
[facility] PROGRESS NOTE  Martha Mcbride DVV:[id number] DOB: Apr 11, 1925 DOA: 07/28/2014 PCP: Simona Huh, MD   -Sepsis: Patient present with fever, BP in the 100 to 90 range, Lactic acid at 3.5. Chest x ray with persistent infiltrates. WBC at 12.  -Vancomycin and cefepime discontinue. Unlikely Health care associated per ID (recommended treatment with clindamycin for aspiration).  -Treating for Aspiration PNA. Will finish clindamycin therapy today -Blood culture no growth to date.  -sepsis pic resolved.  -Acute Hypoxic Respiratory Failure. IPF,  Aspiration PNA.  -Chest x ray 11-01 showed worsening infiltrates: pulmonary edema, Pneumonitis.  -CCM consulted for further evaluation of pneumonitis.   -Continue with treatment of PNA; one more day of clindamycin pending  -Patient will benefit form home oxygen, specially at night.  -Sputum culture normal oro pharyngeal flora.  -Discussed with Dr Elsworth Soho no need for inpatient bronchoscopy, ok to use steroid, need prednisone taper dose. -See Dr Elsworth Soho recommendation.  -IV solumedrol 20 mg IV daily. Discharge on home dose prednisone.   -Dysphagia, oropharyngeal, component of cervical esophageal;  -Family Agree with tube feeding. Will need to reassess swallow.  -Dr Amedeo Plenty following.  -Will need to transition to oral medications when tolerating tube feedings.   History of giardia, Ascaris Lumbricoids: didn't finish treatment. 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 \ ID consulted for treatment recommendation and further evaluation. But doubt she had any of this; ID and pulmonary in agreement. -Of note patient had stool culture negative for parasites last admission. Results negative as well  -Ova and parasite stool negative.  -strongyloids antibody negative.  -Quantiferon Tb negative -Per ID unlikely parasitic infection.   -Chronic Diastolic HF; Appears compensated.  -BNP at 576, last admission at 1467. Patient appears compensated.   -Chest x ray with worsening infiltrates and pneumonitis.  -will resume lasix once seen by SPT and able to tolerate meds  -Polymyalgia Rheumatica:  -On prednisone 5 mg daily. Change to IV due to NPO status.  -leflunomide was discontinue during last admission.  -plan is to resume home dose at discharge  -Gangrenous toes on the right foot  -Unclear etiology at this time, pulses on the right foot palpable with good perfusion  -Follow up with vascular outpatient.  -stable.  -wound care following. Continue local care.   -Decubitus ulcer  -Wound care consulted. -Appreciate recommendation.   -protein calorie Malnutrition, severe; nutrition service consulted for tube feeding. Will follow rec's  Appreciate consultants help and assistance.   Code Status: Full Code.  Family Communication: Care discussed with Daughter Disposition Plan: transfer to med-surg bed   Consultants:  Pulmonary  ID.   Procedures:  none  Antibiotics:  Vancomycin 10-31---11-02  Cefepime 10-31----11-03  Clindamycin 11-03  HPI/Subjective: Patient is afebrile; denies SOB or CP. [name] tube in place and so far tolerating TF's.  Objective: Filed Vitals:   08/03/14 1200  BP: 137/57  Pulse: 71  Temp: 98.8 F (37.1 C)  Resp: 21    Intake/Output Summary (Last 24 hours) at 08/03/14 1312 Last data filed at 08/03/14 1200  Gross per 24 hour  Intake 726.67 ml  Output    900 ml  Net -173.33 ml   Filed Weights   08/01/14 0500 08/02/14 0500 08/03/14 0643  Weight: 63.2 kg (139 lb 5.3 oz) 61.78 kg (136 lb 3.2 oz) 60.601 kg (133 lb 9.6 oz)    Exam:   General:  No distress. [name] tube in place and patient toelrating TF's  Cardiovascular: S 1, S 2 RRR; positive SEM  Respiratory:CTA bilaterally; no wheezing, no rales  Abdomen: BS present, soft. No tenderness   Musculoskeletal: no edema.   Data Reviewed: Basic Metabolic Panel:  Recent Labs Lab 07/29/14 0330 07/30/14 0805 07/31/14 0340  08/01/14 0310 08/03/14 0404  NA 131* 133* 130* 136* 142  K 3.3* 4.1 4.3 4.2 3.8  CL 94* 95* 94* 96 102  CO2 25 25 24 25 25   GLUCOSE 162* 108* 109* 178* 103*  BUN 12 13 12 15  24*  CREATININE 0.55 0.66 0.67 0.62 0.63  CALCIUM 8.2* 8.5 8.6 8.5 8.5  MG  --   --   --   --  2.6*   Liver Function Tests:  Recent Labs Lab 07/28/14 1225  AST 18  ALT 24  ALKPHOS 119*  BILITOT 0.8  PROT 6.5  ALBUMIN 2.8*   CBC:  Recent Labs Lab 07/28/14 1225 07/29/14 0330 07/30/14 0805 07/31/14 0340 08/01/14 0310 08/03/14 0404  WBC 12.5* 8.5 9.7 9.3 5.9 6.9  NEUTROABS 8.4*  --   --   --   --   --   HGB 12.7 11.2* 12.6 12.5 12.5 11.9*  HCT 37.7 32.4* 37.3 37.5 37.7 37.3  MCV 87.5 85.9 86.5 86.6 87.7 89.2  PLT 232 208 212 188 167 194   Cardiac Enzymes:  Recent Labs Lab 07/28/14 1225 07/30/14 1055  TROPONINI <0.30 <0.30   BNP (last 3 results)  Recent Labs  07/19/14 2039 07/28/14 1225 07/29/14 0330  PROBNP 1467.0* 576.5* 753.4*   CBG:  Recent Labs Lab 08/02/14 2025 08/03/14 0001 08/03/14 0323 08/03/14 0739 08/03/14 1205  GLUCAP 149* 119* 93 98 128*    Recent Results (from the past 240 hour(s))  Blood culture (routine x 2)     Status: None   Collection Time: 07/28/14 12:21 PM  Result Value Ref Range Status   Specimen Description BLOOD LEFT WRIST  4 ML IN [facility] [place] BOTTLE  Final   Special Requests NONE  Final   Culture  Setup Time   Final    07/28/2014 20:11 Performed at [facility]    Culture   Final    NO GROWTH 5 DAYS Performed at [facility]    Report Status 08/03/2014 FINAL  Final  Blood culture (routine x 2)     Status: None   Collection Time: 07/28/14 12:49 PM  Result Value Ref Range Status   Specimen Description BLOOD BLOOD LEFT FOREARM  Final   Special Requests BOTTLES DRAWN AEROBIC AND ANAEROBIC 5ML  Final   Culture  Setup Time   Final    07/28/2014 20:10 Performed at [facility]    Culture   Final    NO GROWTH 5  DAYS Performed at [facility]    Report Status 08/03/2014 FINAL  Final  Urine culture     Status: None   Collection Time: 07/28/14  2:03 PM  Result Value Ref Range Status   Specimen Description URINE, CLEAN CATCH  Final   Special Requests NONE  Final   Culture  Setup Time   Final    07/29/2014 02:28 Performed at [place]   Final    30,000 COLONIES/ML Performed at [facility]    Culture   Final    STAPHYLOCOCCUS SPECIES (COAGULASE NEGATIVE) Note: RIFAMPIN AND GENTAMICIN SHOULD NOT BE USED AS SINGLE DRUGS FOR TREATMENT OF STAPH INFECTIONS. Performed at [facility]    Report Status 07/31/2014 FINAL  Final   Organism  ID, Bacteria STAPHYLOCOCCUS SPECIES (COAGULASE NEGATIVE)  Final      Susceptibility   Staphylococcus species (coagulase negative) - MIC*    GENTAMICIN <=0.5 SENSITIVE Sensitive     LEVOFLOXACIN >=8 RESISTANT Resistant     NITROFURANTOIN <=16 SENSITIVE Sensitive     OXACILLIN >=4 RESISTANT Resistant     PENICILLIN >=0.5 RESISTANT Resistant     RIFAMPIN <=0.5 SENSITIVE Sensitive     TRIMETH/SULFA <=10 SENSITIVE Sensitive     VANCOMYCIN 1 SENSITIVE Sensitive     TETRACYCLINE <=1 SENSITIVE Sensitive     * STAPHYLOCOCCUS SPECIES (COAGULASE NEGATIVE)  MRSA PCR Screening     Status: None   Collection Time: 07/28/14  3:52 PM  Result Value Ref Range Status   MRSA by PCR NEGATIVE NEGATIVE Final    Comment:        The GeneXpert MRSA Assay (FDA approved for NASAL specimens only), is one component of a comprehensive MRSA colonization surveillance program. It is not intended to diagnose MRSA infection nor to guide or monitor treatment for MRSA infections.  Culture, expectorated sputum-assessment     Status: None   Collection Time: 07/28/14  4:21 PM  Result Value Ref Range Status   Specimen Description SPUTUM  Final   Special Requests Immunocompromised  Final   Sputum evaluation   Final    THIS SPECIMEN IS  ACCEPTABLE. RESPIRATORY CULTURE REPORT TO FOLLOW.   Report Status 07/28/2014 FINAL  Final  Culture, respiratory (NON-Expectorated)     Status: None   Collection Time: 07/28/14  4:21 PM  Result Value Ref Range Status   Specimen Description SPUTUM  Final   Special Requests NONE  Final   Gram Stain   Final    NO WBC SEEN FEW SQUAMOUS EPITHELIAL CELLS PRESENT MODERATE GRAM POSITIVE RODS FEW GRAM NEGATIVE RODS FEW GRAM POSITIVE COCCI IN PAIRS    Culture   Final    NORMAL OROPHARYNGEAL FLORA Performed at [facility]    Report Status 07/31/2014 FINAL  Final  Ova and parasite examination     Status: None   Collection Time: 07/29/14  7:28 PM  Result Value Ref Range Status   Specimen Description STOOL  Final   Special Requests NONE  Final   Ova and parasites   Final    NO OVA OR PARASITES SEEN NO STRONGYLOIDES STERCORALIS SEEN Performed at [facility]    Report Status 07/31/2014 FINAL  Final     Studies: Dg Abd 1 View  08/02/2014   CLINICAL DATA:  Nasogastric tube placement.  EXAM: ABDOMEN - 1 VIEW  COMPARISON:  CT abdomen and pelvis 05/21/2012.  Chest CT 07/30/2014.  FINDINGS: Enteric tube is looped over the stomach with distal end directed cranially and tip projecting over the lower mediastinum. Recent chest CT demonstrates a hiatal hernia in this location. Oral contrast material is present in the ascending and transverse colon. No dilated loops of bowel are seen to suggest obstruction. There is chronic elevation of the right hemidiaphragm. Dense mitral annular calcifications partially visualized. Mild lumbar levoscoliosis is noted.  IMPRESSION: Enteric tube looped in the stomach with tip directed cranially over the lower mediastinum, likely in the gastric fundus in the herniated portion of the stomach. Consider retracting 10-15 cm to reduce the amount of excess tube in the stomach and place the tip in the gastric body.  These results will be called to the ordering  clinician or representative by the Radiologist Assistant, and communication documented in the PACS or  zVision Dashboard.   Electronically Signed   By: Logan Bores   On: 08/02/2014 10:35   Dg Ander Gaster Tube  08/02/2014   CLINICAL DATA:  Feeding tube placement  EXAM: INTRO LONG GI TUBE  TECHNIQUE: Fluoroscopic guidance was used to in the placement of a feeding tube.  CONTRAST:  36mL OMNIPAQUE IOHEXOL 300 MG/ML  SOLN  FLUOROSCOPY TIME:  18 min 19 seconds  COMPARISON:  08/02/2014.  FINDINGS: The patient present to the radiology department for image guided feeding tube placement. Upon initial presentation the patient had a nasogastric tube in place which was coiled in the patient's large hiatal hernia. This was removed and a guidewire was placed into the gastric lumen. The weighted feeding tube was then advanced over the guidewire into the distal esophagus. Multiple attempts were made to pass the feeding tube through the large hiatal hernia and into the stomach requiring extended fluoroscopy time. After multiple attempts the feeding tube was advanced into the stomach and subsequently post pyloric. Water-soluble contrast material was injected through the feeding tube confirming post pyloric placement.  IMPRESSION: 1. Technically successful post pyloric placement of a weighted feeding tube. 2. Large hiatal hernia.   Electronically Signed   By: Kerby Moors M.D.   On: 08/02/2014 19:07    Scheduled Meds: . antiseptic oral rinse  7 mL Mouth Rinse q12n4p  . aspirin  150 mg Rectal Daily  . butamben-tetracaine-benzocaine  1 spray Topical Once  . chlorhexidine  15 mL Mouth Rinse BID  . clindamycin (CLEOCIN) IV  600 mg Intravenous 3 times per day  . enoxaparin (LOVENOX) injection  40 mg Subcutaneous Q24H  . fluconazole (DIFLUCAN) IV  100 mg Intravenous Daily  . folic acid  1 mg Intravenous Daily  . lidocaine  1 application Topical Once  . methylPREDNISolone (SOLU-MEDROL) injection  20 mg Intravenous Daily   . metoprolol  5 mg Intravenous Q12H  . pantoprazole (PROTONIX) IV  40 mg Intravenous Q12H  . polyvinyl alcohol  1 drop Both Eyes Daily  . sodium chloride  3 mL Intravenous Q12H   Continuous Infusions: . feeding supplement (VITAL AF 1.2 CAL) Stopped (08/03/14 1150)    Principal Problem:   Sepsis Active Problems:   Hyponatremia   Immune disorder   Fever   Diastolic HF (heart failure)   Chronic diastolic heart failure   Acute respiratory failure with hypoxemia   Dyspnea   Pyrexia   Hypoxemia   Pulmonary infiltrate   Malnutrition of moderate degree   Aspiration into airway    Time spent: 35 minutes.     Barton Dubois  [facility] Pager [phone]. If 7PM-7AM, please contact night-coverage at [email or web address], password [facility] 08/03/2014, 1:12 PM  LOS: 6 days

## 2014-08-03 NOTE — Progress Notes (Signed)
Speech Language Pathology Treatment: Dysphagia  Patient Details Name: Martha Mcbride MRN: [id number] DOB: 08/19/1925 Today's Date: 08/03/2014 Time: 6283-1517 SLP Time Calculation (min): 40 min  Assessment / Plan / Recommendation Clinical Impression  SLP provided pt with swallowing exercises to maximize her swallowing rehabilitation.  Daughter [name] and granddaughter were present during session and helped explain indication and implementation of exercises to pt.  All were also educated to recommended exercises and clinically reasoning.    Oral care had been provided earlier per [name] (daughter) therefore SLP proceeded with administration of single ice chip boluses for therapeutic swallows.  Initially pt required max verbal, tactile cues to elicit effortful swallows, however as session progressed, her implementation of exercise improved significantly.  Approximately 5 ice chip boluses were administered during session with SLP requesting pt to swallow twice with each bolus.  Throat clear followed by cough and expectoration of secretions noted during session- occuring twice only.    Reinforced need for oral care prior to ice chip consumption and that pt will likely tracely aspirate small amount of water.  Due diligence is recommended re: tolerance, assuring to cease ice chip consumption if demonstrating difficulties/aspiration.  Further educated pt/family to feeding tubes providing nutrition but not preventing aspiration.     Lingual press reviewed with pt using verbal, visual, tactile feedback and was more difficult for pt to grasp.  Advised pt and family to practice lingual press as tolerated, but advised effortful swallow exercise to provide maximum benefit.    As pt has feeding tube in place, strictly advised against Shaker head lift exercise for fear of dislodging tube.    Pt with oral suction in hand upon SLP entrance to room and has been expectorating secretions today without intake.   SLP recommends  oral suction unit at home given pt will be receiving tube feeding and to help pt with managing secretions.    As pt has a nutritional plan in place and all education/swallow exercises have been reviewed/completed, SLP will sign off.  Advised family and pt to recommendation for pt to have instrumental evaluation of swallow in future due to sensorimotor deficits noted on MBS.    HPI HPI: 80 y/o female with polymyalgia on chronic prednisone (33m) and le[facility]un[facility]ide [facility] MD note.  Pt also with recent Eosinophilia, CHF, acid reflux, parasitic diagnosis - giardia, pinworm, roundworms.  Order for swallow evaluation received and daughter GBaker Janusquestioned if pt may dysphagia.  Pt denies dysphagia but admits to "memory problem" and occasional choking.  She does take her pills with applesauce premorbidly - daughter JRemo Lippsvia speaker phone stated this was only due to pt consuming a full cup of water with pills and complaining of being full - therefore JRemo Lippsrecommended pt take pills with applesauce.  CT Chest 11/2 showed "Evidence of emphysema with traction bronchiectasis in multiple areas of interstitial fibrosis. This appearance is consistent with underlying usual interstitial pneumonitis. There are areas of patchy consolidation in both lower lobes as well as to a lesser extent in the upper lobes. There also moderate pleural effusions. The areas of consolidation are felt to most likely represent superimposed pneumonia. There may be a degree of superimposed congestive heart failure as well given the pleural effusions. 5 mm nodular opacity in the posterior segment of the right upper lobe.  Areas of atherosclerotic change and cardiac valvular calcification."   Pertinent Vitals Pain Assessment: No/denies pain  SLP Plan  All goals met (plan in place for pt dc home with feeding tube and have [place] SlP for dysphagia tx per pt/family)     Recommendations Diet recommendations:  (ice chips after oral care) Medication Administration: Via alternative means Supervision: Full supervision/cueing for compensatory strategies (with ice)              Oral Care Recommendations: Oral care Q4 per protocol;Oral care prior to ice chips Follow up Recommendations: Home health SLP Plan: All goals met (plan in place for pt dc home with feeding tube and have [place] for dysphagia tx per pt/family)    [place], [place] [facility] SLP [phone]

## 2014-08-03 NOTE — Progress Notes (Addendum)
NUTRITION FOLLOW UP  Intervention:   -Continue to advance Vital AF 1.2  by 10 ml every 4 hours to goal rate of 50 ml/hr.  -Tube feeding regimen provides 1440 kcal (96% of needs), 90 grams of protein, and 1071 ml of H2O.  -RD to continue to monitor  Nutrition Dx:   Inadequate oral intake related to decreased appetite/taste changes/nausea as evidenced by PO intake < 75%.; ongoing, now related to NPO status  Goal: Pt to meet >/= 90% of their estimated nutrition needs; not applicable with NPO status   New Goal:   TF to meet >/= 90% of their estimated nutrition needs    Monitor:   TF tolerance, swallow profile, total protein/energy intake, labs, weights, skin integrity  Assessment:   11/02: -Pt's daughter reported pt with decreased appetite since previous hospitalization in 05/2014. PO intake had significant decreased iin past 3 weeks. -Diet recall indicated pt consuming 50% Boost supplements and 5 bites of food for entire day. Daughter had tried increasing caloric density of Boost by adding creamers. Family estimated pt consuming < 500 kcal/day -Pt endorsed feelings of nausea, early satiety and taste changes that inhibited PO intake; would sometimes experience emesis episodes after taking medications -Is eating better during admit, was able to eat >50% of meals yesterday. Denied nausea/abd pain post meals. Family assisted in encouraging meals and chopping up foods pt unable to chew, particularly vegetables. May benefit from adding diet texture modifications as tolerated -SLP consulted on 11/01; noted that RN denied signs of aspirations and SLP recommended to implement nectar thick liquids as warranted -Weight trends indicate pt gained 8 lbs since 05/2014; however pt with nonpitting RLE and LLE which is likely impacting weight -Currently NPO for bronch  11/05: -Pt underwent MBS on 11/03, SLP expressed concern for high aspiration risk and pt's fatigue with 5 tsps intake of barium  solution -Family willing to initiate short term enteral feedings, SLP to follow and reassess swallow function -NGT placed on 11/05  -WOC eval on 11/02; noted 2nd and 3rd toes gangrenous and stg 2 decub sacral ulcer; minimal drainage -CBG's elevated, likely r/t to inflammatory response  11/06: -NGT tube was placed (per MD order)  instead of panda tube, which had been recommended by GI -[name] tube was placed on 11/05 PM -Vital AF 1.2 infusing at 20 ml/hr, however RN had some difficulty with manual flushes overnight -Per discussion with RN, panda tube is no longer occluded and pt is able to receive TF and flushes w/out difficulty -Tolerating w/out aspiration or residuals per RN -Vital AF 1.2 at 20 ml/hr providing 576 kcal (38% est kcal needs),  36 gram protein (45% est protein need), 389 ml free water. Continue to advance to goal rate of 50 ml/hr.  Height: Ht Readings from Last 1 Encounters:  07/28/14 5\' 1"  (1.549 m)    Weight Status:   Wt Readings from Last 1 Encounters:  08/03/14 133 lb 9.6 oz (60.601 kg)  07/30/14 146 lb  Re-estimated needs (from new weight):  Kcal: 1500-1700 Protein: 80-95 gram Fluid: >/= 1600 ml daily  Skin: stage 2 PU on buttock, gangreneous toes on right foot  Diet Order: Diet NPO time specified Except for: Ice Chips   Intake/Output Summary (Last 24 hours) at 08/03/14 0934 Last data filed at 08/03/14 0400  Gross per 24 hour  Intake 385.33 ml  Output    900 ml  Net -514.67 ml    Last BM: 11/01   Labs:   Recent Labs  Lab 07/31/14 0340 08/01/14 0310 08/03/14 0404  NA 130* 136* 142  K 4.3 4.2 3.8  CL 94* 96 102  CO2 24 25 25   BUN 12 15 24*  CREATININE 0.67 0.62 0.63  CALCIUM 8.6 8.5 8.5  MG  --   --  2.6*  GLUCOSE 109* 178* 103*    CBG (last 3)   Recent Labs  08/03/14 0001 08/03/14 0323 08/03/14 0739  GLUCAP 119* 93 98    Scheduled Meds: . antiseptic oral rinse  7 mL Mouth Rinse q12n4p  . aspirin  150 mg Rectal Daily  .  butamben-tetracaine-benzocaine  1 spray Topical Once  . chlorhexidine  15 mL Mouth Rinse BID  . clindamycin (CLEOCIN) IV  600 mg Intravenous 3 times per day  . enoxaparin (LOVENOX) injection  40 mg Subcutaneous Q24H  . fluconazole (DIFLUCAN) IV  100 mg Intravenous Daily  . folic acid  1 mg Intravenous Daily  . lidocaine  1 application Topical Once  . methylPREDNISolone (SOLU-MEDROL) injection  20 mg Intravenous Daily  . metoprolol  5 mg Intravenous Q12H  . pantoprazole (PROTONIX) IV  40 mg Intravenous Q12H  . polyvinyl alcohol  1 drop Both Eyes Daily  . sodium chloride  3 mL Intravenous Q12H    Continuous Infusions: . feeding supplement (VITAL AF 1.2 CAL) 1,000 mL (08/02/14 1114)    Atlee Abide MS RD LDN Clinical Dietitian [phone]

## 2014-08-03 NOTE — Progress Notes (Signed)
As per order, On-call notified of CBG 174, no new orders. Pt on steroids.

## 2014-08-03 NOTE — Progress Notes (Signed)
Physical Therapy Treatment Patient Details Name: Martha Mcbride MRN: [id number] DOB: 1925-01-09 Today's Date: 08/03/2014    History of Present Illness 80 yo female admitted with sepsis, hypoxia. Hx of weakness, polymyalgia rheumatica, breast cancer, R foot ischemic toes. Recent d/c from [place] on 10/26. Pt is from home with family.     PT Comments    Progressing with mobility. Still has weakness and fatigues easily with mobility but improved performance compared to last session. Encouraged family to have pt up in chair daily with nursing assistance over the weekend and for family to help pt with exercises provided by OT and PT. Also explained that PT will likely return on Monday. Daughter states that pt may d/c home on Monday.   Follow Up Recommendations  Home health PT;Supervision/Assistance - 24 hour     Equipment Recommendations       Recommendations for Other Services       Precautions / Restrictions Precautions Precautions: Fall Restrictions Weight Bearing Restrictions: No    Mobility  Bed Mobility Overal bed mobility: Needs Assistance Bed Mobility: Supine to Sit     Supine to sit: Min assist     General bed mobility comments: assist for trunk  Transfers Overall transfer level: Needs assistance Equipment used: Rolling walker (2 wheeled) Transfers: Sit to/from Stand Sit to Stand: Min assist         General transfer comment: Assist to rise, stabilize, control descent.   Ambulation/Gait   Ambulation Distance (Feet): 60 Feet Assistive device: Rolling walker (2 wheeled) Gait Pattern/deviations: Step-through pattern;Decreased stride length;Trunk flexed     General Gait Details: assist to stabilize pt an maneuver with RW. Fatigues easily. Dyspnea 2/4.Remained on 2L O2.    Stairs            Wheelchair Mobility    Modified Rankin (Stroke Patients Only)       Balance                                    Cognition Arousal/Alertness:  Awake/alert Behavior During Therapy: WFL for tasks assessed/performed Overall Cognitive Status: Within Functional Limits for tasks assessed                      Exercises General Exercises - Lower Extremity Ankle Circles/Pumps: AROM;Both;15 reps;Seated Quad Sets: AROM;Both;15 reps;Seated Long Arc Quad: AROM;Both;15 reps;Seated Hip ABduction/ADduction: AROM;Both;15 reps;Seated Hip Flexion/Marching: AROM;Both;15 reps;Seated    General Comments        Pertinent Vitals/Pain Pain Assessment: No/denies pain    Home Living                      Prior Function            PT Goals (current goals can now be found in the care plan section) Progress towards PT goals: Progressing toward goals    Frequency  Min 3X/week    PT Plan Current plan remains appropriate    Co-evaluation             End of Session Equipment Utilized During Treatment: Gait belt;Oxygen Activity Tolerance: Patient tolerated treatment well Patient left: in chair;with call bell/phone within reach;with family/visitor present     Time: 0093-8182 PT Time Calculation (min): 29 min  Charges:  $Gait Training: 8-22 mins $Therapeutic Exercise: 8-22 mins  G Codes:      Weston Anna, MPT Pager: [phone]

## 2014-08-04 DIAGNOSIS — R1314 Dysphagia, pharyngoesophageal phase: Secondary | ICD-10-CM

## 2014-08-04 LAB — GLUCOSE, CAPILLARY
GLUCOSE-CAPILLARY: 107 mg/dL — AB (ref 70–99)
GLUCOSE-CAPILLARY: 154 mg/dL — AB (ref 70–99)
Glucose-Capillary: 101 mg/dL — ABNORMAL HIGH (ref 70–99)
Glucose-Capillary: 120 mg/dL — ABNORMAL HIGH (ref 70–99)
Glucose-Capillary: 172 mg/dL — ABNORMAL HIGH (ref 70–99)
Glucose-Capillary: 177 mg/dL — ABNORMAL HIGH (ref 70–99)

## 2014-08-04 MED ORDER — PANTOPRAZOLE SODIUM 40 MG PO PACK
40.0000 mg | PACK | Freq: Two times a day (BID) | ORAL | Status: DC
  Administered 2014-08-04 – 2014-08-10 (×11): 40 mg
  Filled 2014-08-04 (×15): qty 20

## 2014-08-04 MED ORDER — METOPROLOL TARTRATE 25 MG/10 ML ORAL SUSPENSION
6.2500 mg | Freq: Two times a day (BID) | ORAL | Status: DC
  Administered 2014-08-04 – 2014-08-10 (×11): 6.25 mg
  Filled 2014-08-04 (×13): qty 2.5

## 2014-08-04 MED ORDER — FUROSEMIDE 8 MG/ML PO SOLN
20.0000 mg | Freq: Two times a day (BID) | ORAL | Status: DC
  Administered 2014-08-04: 20 mg
  Filled 2014-08-04 (×4): qty 5

## 2014-08-04 MED ORDER — PREDNISOLONE 15 MG/5ML PO SOLN
5.0000 mg | Freq: Every day | ORAL | Status: DC
  Administered 2014-08-05 – 2014-08-10 (×6): 5.1 mg
  Filled 2014-08-04 (×8): qty 5

## 2014-08-04 MED ORDER — POTASSIUM CHLORIDE 20 MEQ/15ML (10%) PO SOLN
40.0000 meq | Freq: Every day | ORAL | Status: DC
Start: 2014-08-05 — End: 2014-08-05
  Filled 2014-08-04: qty 30

## 2014-08-04 MED ORDER — METHYLPREDNISOLONE SODIUM SUCC 40 MG IJ SOLR
10.0000 mg | Freq: Every day | INTRAMUSCULAR | Status: DC
Start: 2014-08-04 — End: 2014-08-04
  Administered 2014-08-04: 10 mg via INTRAVENOUS

## 2014-08-04 NOTE — Progress Notes (Signed)
[facility] PROGRESS NOTE Subjective Pt with NG feeding tube and is tolerating well. Daughter in room with many questions.  Objective: Vital signs in last 24 hours: Temp:  [97.6 F (36.4 C)-98.8 F (37.1 C)] 97.7 F (36.5 C) (11/07 0457) Pulse Rate:  [71-88] 84 (11/07 0457) Resp:  [16-24] 16 (11/07 0457) BP: (137-166)/(57-87) 147/68 mmHg (11/07 0457) SpO2:  [95 %-100 %] 98 % (11/07 0457) Weight:  [61.054 kg (134 lb 9.6 oz)] 61.054 kg (134 lb 9.6 oz) (11/07 0457) Last BM Date: 08/03/14  Intake/Output from previous day: 11/06 0701 - 11/07 0700 In: 296.7 [NG/GT:96.7; IV Piggyback:200] Out: -  Intake/Output this shift:    PE: General--pt in no distress Heart-- Lungs-- Abdomen--  Lab Results:  Recent Labs  08/03/14 0404  WBC 6.9  HGB 11.9*  HCT 37.3  PLT 194   BMET  Recent Labs  08/03/14 0404  NA 142  K 3.8  CL 102  CO2 25  CREATININE 0.63   LFT No results for input(s): PROT, AST, ALT, ALKPHOS, BILITOT, BILIDIR, IBILI in the last 72 hours. PT/INR No results for input(s): LABPROT, INR in the last 72 hours. PANCREAS No results for input(s): LIPASE in the last 72 hours.       Studies/Results: Dg Abd 1 View  08/02/2014   CLINICAL DATA:  Nasogastric tube placement.  EXAM: ABDOMEN - 1 VIEW  COMPARISON:  CT abdomen and pelvis 05/21/2012.  Chest CT 07/30/2014.  FINDINGS: Enteric tube is looped over the stomach with distal end directed cranially and tip projecting over the lower mediastinum. Recent chest CT demonstrates a hiatal hernia in this location. Oral contrast material is present in the ascending and transverse colon. No dilated loops of bowel are seen to suggest obstruction. There is chronic elevation of the right hemidiaphragm. Dense mitral annular calcifications partially visualized. Mild lumbar levoscoliosis is noted.  IMPRESSION: Enteric tube looped in the stomach with tip directed cranially over the lower mediastinum, likely in the gastric  fundus in the herniated portion of the stomach. Consider retracting 10-15 cm to reduce the amount of excess tube in the stomach and place the tip in the gastric body.  These results will be called to the ordering clinician or representative by the Radiologist Assistant, and communication documented in the PACS or zVision Dashboard.   Electronically Signed   By: Logan Bores   On: 08/02/2014 10:35   Dg Ander Gaster Tube  08/02/2014   CLINICAL DATA:  Feeding tube placement  EXAM: INTRO LONG GI TUBE  TECHNIQUE: Fluoroscopic guidance was used to in the placement of a feeding tube.  CONTRAST:  85mL OMNIPAQUE IOHEXOL 300 MG/ML  SOLN  FLUOROSCOPY TIME:  18 min 19 seconds  COMPARISON:  08/02/2014.  FINDINGS: The patient present to the radiology department for image guided feeding tube placement. Upon initial presentation the patient had a nasogastric tube in place which was coiled in the patient's large hiatal hernia. This was removed and a guidewire was placed into the gastric lumen. The weighted feeding tube was then advanced over the guidewire into the distal esophagus. Multiple attempts were made to pass the feeding tube through the large hiatal hernia and into the stomach requiring extended fluoroscopy time. After multiple attempts the feeding tube was advanced into the stomach and subsequently post pyloric. Water-soluble contrast material was injected through the feeding tube confirming post pyloric placement.  IMPRESSION: 1. Technically successful post pyloric placement of a weighted feeding tube. 2. Large hiatal hernia.  Electronically Signed   By: Kerby Moors M.D.   On: 08/02/2014 19:07    Medications: I have reviewed the patient's current medications.  Assessment/Plan: 1. Oropharyngeal Dysphagia. Currently with NG feeding. Discussed with daughter. Told her the NG will buy some time and we all hope that she will improve, but if not will need to consider permanent access. Getting her meds in will be a  problem    Mac Dowdell JR,Byran Bilotti L 08/04/2014, 7:54 AM

## 2014-08-04 NOTE — Progress Notes (Signed)
CARE MANAGEMENT NOTE 08/04/2014  Patient:  HAVANNA, GRONER   Account Number:  [id number]  Date Initiated:  08/01/2014  Documentation initiated by:  DAVIS,RHONDA  Subjective/Objective Assessment:   patient resp distress and failure/endo does show swallowing defects and high risk of aspiration/requiring 02 via face mask at 40%     Action/Plan:   tbd/patient lives alone does have support her adult children and friends/not clear as to how close the children live to the patient   Anticipated DC Date:  08/04/2014   Anticipated DC Plan:  [place]  In-house referral  NA      [place]  CM consult      PAC Choice  NA   Choice offered to / List presented to:  NA   DME arranged  OXYGEN  TUBE FEEDING PUMP  SUCTION      DME agency  [place] arranged  HH-1 RN  [place] agency  [place].   Status of service:  In process, will continue to follow Medicare Important Message given?  YES (If response is "NO", the following Medicare IM given date fields will be blank) Date Medicare IM given:  08/04/2014 Medicare IM given by:  [facility] Date Additional Medicare IM given:   Additional Medicare IM given by:    Discharge Disposition:    Per UR Regulation:  Reviewed for med. necessity/level of care/duration of stay  If discussed at [place] of Stay Meetings, dates discussed:    Comments:  08/04/2014 1650 NCM spoke to [facility] DME rep and he will follow up with dtr in am about DME delivery. Pt may need hospital bed at home. Jonnie Finner RN CCM Case Mgmt phone [phone]  08/04/2014 1000  NCM spoke to make aware of dc home with DME. States DME will be delivered at dc to home. When they bring concentrator they will deliver suction. Notified [facility] rep that pt will not dc home today. Jonnie Finner RN CCM Case Mgmt phone [phone]   [id number] Rosana Hoes, RN,  BSN, CCM Chart reviewed. Discharge needs and patient's stay to be reviewed and followed by case manager.[place]

## 2014-08-04 NOTE — Progress Notes (Signed)
Notified [facility] of scheduled dc home today. Jonnie Finner RN CCM Case Mgmt phone [phone]

## 2014-08-04 NOTE — Progress Notes (Signed)
[facility] PROGRESS NOTE  Martha Mcbride XVQ:[id number] DOB: Jan 16, 1925 DOA: 07/28/2014 PCP: Simona Huh, MD   -Sepsis: Patient present with fever, BP in the 100 to 90 range, Lactic acid at 3.5. Chest x ray with persistent infiltrates. WBC at 12.  -Vancomycin and cefepime initially, then discontinued. Unlikely Health care associated per ID (recommended treatment with clindamycin for aspiration; treatment completed while inpatient).  -Treating for Aspiration PNA. Will finish clindamycin therapy today -Blood culture no growth to date.  -sepsis pic resolved.  -Acute Hypoxic Respiratory Failure. IPF,  Aspiration PNA.  -Chest x ray 11-01 showed worsening infiltrates: pulmonary edema, Pneumonitis.  -CCM consulted for further evaluation of pneumonitis.   -Completed tx for PNA (aspiration) on 11/06  -Patient will benefit form home oxygen, specially at night; arranged through CM  -Sputum culture showed normal oro pharyngeal flora (suggesting aspiration as well).  -Discussed with Dr. Elsworth Soho no need for inpatient bronchoscopy, ok to use steroid -no fever, normal WBC's   -Dysphagia, oropharyngeal, component of cervical esophageal;  -Family Agree with tube feeding. Will need to reassess swallow in 10-14 days.  -GI on board -Will d/c home in 1-2 days with TF's and SPT; follow up with GI as an outpatient.  History of giardia, Ascaris Lumbricoids: didn't finish treatment. 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 \ ID consulted for treatment recommendation and further evaluation. But doubt she had any of this; ID and pulmonary in agreement. -Of note patient had stool culture negative for parasites last admission. Results negative as well  -Ova and parasite stool negative.  -strongyloids antibody negative.  -Quantiferon Tb negative -Per ID unlikely parasitic infection.   -Chronic Diastolic HF; Appears compensated.  -BNP at 576, last admission at 1467. Patient appears compensated.   -Chest x ray with worsening infiltrates and pneumonitis.  -will resume lasix oral solution through panda tube -will also add liquid potassium to maintain electrolytes stable -will start oral metoprolol   -Polymyalgia Rheumatica:  -On prednisone 5 mg daily. Change to oral solution methylprednisolone (same dose as receiving at home, 5mg ).  -leflunomide was discontinue during last admission and would keep it like until follow up with rheumatology service).   -Gangrenous toes on the right foot  -Unclear etiology at this time, pulses on the right foot palpable with good perfusion  -Follow up with vascular outpatient.  -stable.  -wound care following. Continue local care.   -Decubitus ulcer  -Wound care consulted. -Appreciate recommendation.   -protein calorie Malnutrition, severe; nutrition service consulted for tube feeding. Will follow rec's  Appreciate consultants help and assistance; will follow rec's.   Code Status: Full Code.  Family Communication: Care discussed with Daughter at bedside Disposition Plan: transition meds to PO; hopefully home in 1-2 days   Consultants:  Pulmonary  ID.   Procedures:  none  Antibiotics:  Vancomycin 10-31---11-02  Cefepime 10-31----11-03  Clindamycin 11-03>>11/06  HPI/Subjective: Patient denies SOB or CP. [name] tube in place and so far tolerating TF's. No fever. Plan is to switch meds to oral form (liquid, to decrease risk of clogging panda tube)  Objective: Filed Vitals:   08/04/14 0457  BP: 147/68  Pulse: 84  Temp: 97.7 F (36.5 C)  Resp: 16    Intake/Output Summary (Last 24 hours) at 08/04/14 1214 Last data filed at 08/03/14 2043  Gross per 24 hour  Intake     50 ml  Output      0 ml  Net     50 ml   [employer]  08/02/14 0500 08/03/14 0643 08/04/14 0457  Weight: 61.78 kg (136 lb 3.2 oz) 60.601 kg (133 lb 9.6 oz) 61.054 kg (134 lb 9.6 oz)    Exam:   General:  No distress. [name] tube in place and  patient tolerating TF's  Cardiovascular: S 1, S 2 RRR; positive SEM  Respiratory:CTA bilaterally; no wheezing, no rales  Abdomen: BS present, soft. No tenderness   Musculoskeletal: no edema.   Data Reviewed: Basic Metabolic Panel:  Recent Labs Lab 07/29/14 0330 07/30/14 0805 07/31/14 0340 08/01/14 0310 08/03/14 0404  NA 131* 133* 130* 136* 142  K 3.3* 4.1 4.3 4.2 3.8  CL 94* 95* 94* 96 102  CO2 25 25 24 25 25   GLUCOSE 162* 108* 109* 178* 103*  BUN 12 13 12 15  24*  CREATININE 0.55 0.66 0.67 0.62 0.63  CALCIUM 8.2* 8.5 8.6 8.5 8.5  MG  --   --   --   --  2.6*   Liver Function Tests:  Recent Labs Lab 07/28/14 1225  AST 18  ALT 24  ALKPHOS 119*  BILITOT 0.8  PROT 6.5  ALBUMIN 2.8*   CBC:  Recent Labs Lab 07/28/14 1225 07/29/14 0330 07/30/14 0805 07/31/14 0340 08/01/14 0310 08/03/14 0404  WBC 12.5* 8.5 9.7 9.3 5.9 6.9  NEUTROABS 8.4*  --   --   --   --   --   HGB 12.7 11.2* 12.6 12.5 12.5 11.9*  HCT 37.7 32.4* 37.3 37.5 37.7 37.3  MCV 87.5 85.9 86.5 86.6 87.7 89.2  PLT 232 208 212 188 167 194   Cardiac Enzymes:  Recent Labs Lab 07/28/14 1225 07/30/14 1055  TROPONINI <0.30 <0.30   BNP (last 3 results)  Recent Labs  07/19/14 2039 07/28/14 1225 07/29/14 0330  PROBNP 1467.0* 576.5* 753.4*   CBG:  Recent Labs Lab 08/03/14 1205 08/03/14 2007 08/03/14 2348 08/04/14 0440 08/04/14 0753  GLUCAP 128* 174* 119* 107* 101*    Recent Results (from the past 240 hour(s))  Blood culture (routine x 2)     Status: None   Collection Time: 07/28/14 12:21 PM  Result Value Ref Range Status   Specimen Description BLOOD LEFT WRIST  4 ML IN [facility] BOTTLE  Final   Special Requests NONE  Final   Culture  Setup Time   Final    07/28/2014 20:11 Performed at [place]   Final    NO GROWTH 5 DAYS Performed at [facility]    Report Status 08/03/2014 FINAL  Final  Blood culture (routine x 2)     Status: None   Collection  Time: 07/28/14 12:49 PM  Result Value Ref Range Status   Specimen Description BLOOD BLOOD LEFT FOREARM  Final   Special Requests BOTTLES DRAWN AEROBIC AND ANAEROBIC 5ML  Final   Culture  Setup Time   Final    07/28/2014 20:10 Performed at [place]   Final    NO GROWTH 5 DAYS Performed at [facility]    Report Status 08/03/2014 FINAL  Final  Urine culture     Status: None   Collection Time: 07/28/14  2:03 PM  Result Value Ref Range Status   Specimen Description URINE, CLEAN CATCH  Final   Special Requests NONE  Final   Culture  Setup Time   Final    07/29/2014 02:28 Performed at [place]   Final  30,000 COLONIES/ML Performed at [facility]   Final    STAPHYLOCOCCUS SPECIES (COAGULASE NEGATIVE) Note: RIFAMPIN AND GENTAMICIN SHOULD NOT BE USED AS SINGLE DRUGS FOR TREATMENT OF STAPH INFECTIONS. Performed at [facility]    Report Status 07/31/2014 FINAL  Final   Organism ID, Bacteria STAPHYLOCOCCUS SPECIES (COAGULASE NEGATIVE)  Final      Susceptibility   Staphylococcus species (coagulase negative) - MIC*    GENTAMICIN <=0.5 SENSITIVE Sensitive     LEVOFLOXACIN >=8 RESISTANT Resistant     NITROFURANTOIN <=16 SENSITIVE Sensitive     OXACILLIN >=4 RESISTANT Resistant     PENICILLIN >=0.5 RESISTANT Resistant     RIFAMPIN <=0.5 SENSITIVE Sensitive     TRIMETH/SULFA <=10 SENSITIVE Sensitive     VANCOMYCIN 1 SENSITIVE Sensitive     TETRACYCLINE <=1 SENSITIVE Sensitive     * STAPHYLOCOCCUS SPECIES (COAGULASE NEGATIVE)  MRSA PCR Screening     Status: None   Collection Time: 07/28/14  3:52 PM  Result Value Ref Range Status   MRSA by PCR NEGATIVE NEGATIVE Final    Comment:        The GeneXpert MRSA Assay (FDA approved for NASAL specimens only), is one component of a comprehensive MRSA colonization surveillance program. It is not intended to diagnose MRSA infection nor to guide  or monitor treatment for MRSA infections.  Culture, expectorated sputum-assessment     Status: None   Collection Time: 07/28/14  4:21 PM  Result Value Ref Range Status   Specimen Description SPUTUM  Final   Special Requests Immunocompromised  Final   Sputum evaluation   Final    THIS SPECIMEN IS ACCEPTABLE. RESPIRATORY CULTURE REPORT TO FOLLOW.   Report Status 07/28/2014 FINAL  Final  Culture, respiratory (NON-Expectorated)     Status: None   Collection Time: 07/28/14  4:21 PM  Result Value Ref Range Status   Specimen Description SPUTUM  Final   Special Requests NONE  Final   Gram Stain   Final    NO WBC SEEN FEW SQUAMOUS EPITHELIAL CELLS PRESENT MODERATE GRAM POSITIVE RODS FEW GRAM NEGATIVE RODS FEW GRAM POSITIVE COCCI IN PAIRS    Culture   Final    NORMAL OROPHARYNGEAL FLORA Performed at [facility]    Report Status 07/31/2014 FINAL  Final  Ova and parasite examination     Status: None   Collection Time: 07/29/14  7:28 PM  Result Value Ref Range Status   Specimen Description STOOL  Final   Special Requests NONE  Final   Ova and parasites   Final    NO OVA OR PARASITES SEEN NO STRONGYLOIDES STERCORALIS SEEN Performed at [facility]    Report Status 07/31/2014 FINAL  Final     Studies: Dg Intro Long Gi Tube  08/02/2014   CLINICAL DATA:  Feeding tube placement  EXAM: INTRO LONG GI TUBE  TECHNIQUE: Fluoroscopic guidance was used to in the placement of a feeding tube.  CONTRAST:  54mL OMNIPAQUE IOHEXOL 300 MG/ML  SOLN  FLUOROSCOPY TIME:  18 min 19 seconds  COMPARISON:  08/02/2014.  FINDINGS: The patient present to the radiology department for image guided feeding tube placement. Upon initial presentation the patient had a nasogastric tube in place which was coiled in the patient's large hiatal hernia. This was removed and a guidewire was placed into the gastric lumen. The weighted feeding tube was then advanced over the guidewire into the distal esophagus.  Multiple attempts were made  to pass the feeding tube through the large hiatal hernia and into the stomach requiring extended fluoroscopy time. After multiple attempts the feeding tube was advanced into the stomach and subsequently post pyloric. Water-soluble contrast material was injected through the feeding tube confirming post pyloric placement.  IMPRESSION: 1. Technically successful post pyloric placement of a weighted feeding tube. 2. Large hiatal hernia.   Electronically Signed   By: Kerby Moors M.D.   On: 08/02/2014 19:07    Scheduled Meds: . antiseptic oral rinse  7 mL Mouth Rinse q12n4p  . aspirin  150 mg Rectal Daily  . chlorhexidine  15 mL Mouth Rinse BID  . enoxaparin (LOVENOX) injection  40 mg Subcutaneous Q24H  . fluconazole (DIFLUCAN) IV  100 mg Intravenous Daily  . folic acid  1 mg Intravenous Daily  . methylPREDNISolone (SOLU-MEDROL) injection  10 mg Intravenous Daily  . metoprolol  5 mg Intravenous Q12H  . pantoprazole (PROTONIX) IV  40 mg Intravenous Q12H  . polyvinyl alcohol  1 drop Both Eyes Daily  . sodium chloride  3 mL Intravenous Q12H   Continuous Infusions: . feeding supplement (VITAL AF 1.2 CAL) Stopped (08/03/14 1150)    Principal Problem:   Sepsis Active Problems:   Hyponatremia   Immune disorder   Fever   Diastolic HF (heart failure)   Chronic diastolic heart failure   Acute respiratory failure with hypoxemia   Dyspnea   Pyrexia   Hypoxemia   Pulmonary infiltrate   Malnutrition of moderate degree   Aspiration into airway   Failure to thrive in adult   Blood poisoning    Time spent: 30 minutes.     Barton Dubois  [facility] Pager [phone]. If 7PM-7AM, please contact night-coverage at [email or web address], password [facility] 08/04/2014, 12:14 PM  LOS: 7 days

## 2014-08-05 DIAGNOSIS — R1310 Dysphagia, unspecified: Secondary | ICD-10-CM | POA: Insufficient documentation

## 2014-08-05 LAB — BASIC METABOLIC PANEL
Anion gap: 14 (ref 5–15)
BUN: 18 mg/dL (ref 6–23)
CO2: 22 mEq/L (ref 19–32)
Calcium: 8.4 mg/dL (ref 8.4–10.5)
Chloride: 101 mEq/L (ref 96–112)
Creatinine, Ser: 0.5 mg/dL (ref 0.50–1.10)
GFR calc Af Amer: >90 mL/min (ref >90)
GFR, EST NON AFRICAN AMERICAN: 85 mL/min — AB (ref >90)
Glucose, Bld: 109 mg/dL — ABNORMAL HIGH (ref 70–99)
Potassium: 3.3 mEq/L — ABNORMAL LOW (ref 3.7–5.3)
SODIUM: 137 meq/L (ref 137–147)

## 2014-08-05 LAB — CBC
HCT: 38 % (ref 36.0–46.0)
Hemoglobin: 12.4 g/dL (ref 12.0–15.0)
MCH: 29.2 pg (ref 26.0–34.0)
MCHC: 32.6 g/dL (ref 30.0–36.0)
MCV: 89.4 fL (ref 78.0–100.0)
PLATELETS: 164 10*3/uL (ref 150–400)
RBC: 4.25 MIL/uL (ref 3.87–5.11)
RDW: 18.7 % — AB (ref 11.5–15.5)
WBC: 7.2 10*3/uL (ref 4.0–10.5)

## 2014-08-05 LAB — GLUCOSE, CAPILLARY
GLUCOSE-CAPILLARY: 92 mg/dL (ref 70–99)
GLUCOSE-CAPILLARY: 134 mg/dL — AB (ref 70–99)
Glucose-Capillary: 100 mg/dL — ABNORMAL HIGH (ref 70–99)
Glucose-Capillary: 129 mg/dL — ABNORMAL HIGH (ref 70–99)
Glucose-Capillary: 136 mg/dL — ABNORMAL HIGH (ref 70–99)

## 2014-08-05 MED ORDER — ENOXAPARIN SODIUM 40 MG/0.4ML ~~LOC~~ SOLN
40.0000 mg | SUBCUTANEOUS | Status: DC
Start: 2014-08-05 — End: 2014-08-10
  Administered 2014-08-06 – 2014-08-09 (×4): 40 mg via SUBCUTANEOUS
  Filled 2014-08-05 (×6): qty 0.4

## 2014-08-05 MED ORDER — FUROSEMIDE 8 MG/ML PO SOLN
20.0000 mg | Freq: Every day | ORAL | Status: DC
  Administered 2014-08-06 – 2014-08-10 (×5): 20 mg
  Filled 2014-08-05 (×5): qty 5

## 2014-08-05 MED ORDER — VANCOMYCIN HCL IN DEXTROSE 1-5 GM/200ML-% IV SOLN
1000.0000 mg | INTRAVENOUS | Status: AC
  Administered 2014-08-06: 1000 mg via INTRAVENOUS
  Filled 2014-08-05: qty 200

## 2014-08-05 MED ORDER — POTASSIUM CHLORIDE 20 MEQ/15ML (10%) PO SOLN
20.0000 meq | Freq: Every day | ORAL | Status: DC
  Administered 2014-08-05 – 2014-08-10 (×6): 20 meq
  Filled 2014-08-05 (×6): qty 15

## 2014-08-05 NOTE — Progress Notes (Signed)
[name] tube unclogged with coke per MD order and flushed well, tube feed resumed at 20/hr. Will continue to monitor.

## 2014-08-05 NOTE — Progress Notes (Signed)
[name] tube unclogged with coke per MD order, resumed tube feed at 20/hr. Will continue to monitor.

## 2014-08-05 NOTE — Progress Notes (Signed)
Pushing medications through NG tube extremely difficult. Ng feed going at 40/hr, continually alarming and reading "occluded", attempted to flush tube and had another RN attempt as well, both unable to flush. Decreased rate to 38mL/hr. Will continue to monitor.

## 2014-08-05 NOTE — Progress Notes (Signed)
[facility] PROGRESS NOTE Subjective [name] apparently already plugged up.  Objective: Vital signs in last 24 hours: Temp:  [97.5 F (36.4 C)-98.3 F (36.8 C)] 98.3 F (36.8 C) (11/08 0427) Pulse Rate:  [77-81] 81 (11/08 0427) Resp:  [18-20] 18 (11/08 0427) BP: (134-145)/(61-69) 142/61 mmHg (11/08 0427) SpO2:  [95 %-99 %] 99 % (11/08 0427) Weight:  [58.8 kg (129 lb 10.1 oz)] 58.8 kg (129 lb 10.1 oz) (11/08 0427) Last BM Date: 08/03/14  Intake/Output from previous day: 11/07 0701 - 11/08 0700 In: -  Out: 1400 [Urine:1400] Intake/Output this shift:      Lab Results:  Recent Labs  08/03/14 0404 08/05/14 0411  WBC 6.9 7.2  HGB 11.9* 12.4  HCT 37.3 38.0  PLT 194 164   BMET  Recent Labs  08/03/14 0404 08/05/14 0411  NA 142 137  K 3.8 3.3*  CL 102 101  CO2 25 22  CREATININE 0.63 0.50   LFT No results for input(s): PROT, AST, ALT, ALKPHOS, BILITOT, BILIDIR, IBILI in the last 72 hours. PT/INR No results for input(s): LABPROT, INR in the last 72 hours. PANCREAS No results for input(s): LIPASE in the last 72 hours.       Studies/Results: No results found.  Medications: I have reviewed the patient's current medications.  Assessment/Plan: 1. Feeding problems. ? If she will recover or not. Seems fairly functional. Dr Dyann Kief and I discussed PEG with the pt and daughter. They are considering. ? If could be cone this admission or replace panda. Hopefully, decision will be made soon.   Martha Mcbride,Xavier Fournier L 08/05/2014, 9:04 AM

## 2014-08-05 NOTE — Progress Notes (Signed)
[facility] PROGRESS NOTE  Martha Mcbride TOI:[id number] DOB: 09-28-1925 DOA: 07/28/2014 PCP: Simona Huh, MD   -Sepsis: Patient present with fever, BP in the 100 to 90 range, Lactic acid at 3.5. Chest x ray with persistent infiltrates. WBC at 12.  -Vancomycin and cefepime initially, then discontinued. Unlikely Health care associated per ID (recommended treatment with clindamycin for aspiration; treatment completed while inpatient).  -Treating for Aspiration PNA. Will finish clindamycin therapy today -Blood culture no growth to date.  -sepsis pic resolved.  -Acute Hypoxic Respiratory Failure. IPF,  Aspiration PNA.  -Chest x ray 11-01 showed worsening infiltrates: pulmonary edema, Pneumonitis.  -CCM consulted for further evaluation of pneumonitis.   -Completed tx for PNA (aspiration) on 11/06  -Patient will benefit form home oxygen, specially at night; arranged through CM  -Sputum culture showed normal oro pharyngeal flora (suggesting aspiration as well).  -Discussed with Dr. Elsworth Soho no need for inpatient bronchoscopy, ok to use steroid -no fever, normal WBC's   -Dysphagia, oropharyngeal, component of cervical esophageal;  -Family Agree with tube feeding; currently PEg; as panda tube is clogging and unable to provide desirable rate for nutrition. Will need to reassess swallow in 10-14 days.  -GI on board -Will d/c home in 1-2 days with TF's and SPT; follow up with GI as an outpatient.  History of giardia, Ascaris Lumbricoids: didn't finish treatment. 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 \ ID consulted for treatment recommendation and further evaluation. But doubt she had any of this; ID and pulmonary in agreement. -Of note patient had stool culture negative for parasites last admission. Results negative as well  -Ova and parasite stool negative.  -strongyloids antibody negative.  -Quantiferon Tb negative -Per ID unlikely parasitic infection.   -Chronic Diastolic HF;  Appears compensated.  -BNP at 576, last admission at 1467. Patient appears compensated.  -Chest x ray with worsening infiltrates and pneumonitis.  -will resume lasix oral solution through panda tube (but given decreased in oral intake and fluids; will switch to once a day) -will also add liquid potassium to maintain electrolytes stable -will start oral metoprolol   -Polymyalgia Rheumatica:  -On prednisone 5 mg daily. Change to oral solution methylprednisolone (same dose as receiving at home, 5mg ).  -leflunomide was discontinue during last admission and would keep it like until follow up with rheumatology service).   -Gangrenous toes on the right foot  -Unclear etiology at this time, pulses on the right foot palpable with good perfusion  -Follow up with vascular outpatient.  -stable.  -wound care following. Continue local care.   -Decubitus ulcer  -Wound care consulted. -Appreciate recommendation.   -protein calorie Malnutrition, severe; nutrition service consulted for tube feeding.  -panda will be replaced by PEG on 11/9 by IR -continue maintenance and meds through panda if able to be unclogged  -NPO after midnight   Appreciate consultants help and assistance; will follow rec's.   Code Status: Full Code.  Family Communication: Care discussed with Daughter at bedside Disposition Plan: transition meds to PO; hopefully home in 1-2 days   Consultants:  Pulmonary  ID.   IR (for PEG placement)  Procedures:  none  Antibiotics:  Vancomycin 10-31---11-02  Cefepime 10-31----11-03  Clindamycin 11-03>>11/06  HPI/Subjective: Patient denies SOB or CP. [name] tube clogged after TF rate increased. Patient and family will like PEG tube placement.  Objective: Filed Vitals:   08/05/14 0427  BP: 142/61  Pulse: 81  Temp: 98.3 F (36.8 C)  Resp: 18    Intake/Output Summary (  Last 24 hours) at 08/05/14 1051 Last data filed at 08/05/14 0600  Gross per 24 hour  Intake       0 ml  Output   1400 ml  Net  -1400 ml   Filed Weights   08/03/14 0643 08/04/14 0457 08/05/14 0427  Weight: 60.601 kg (133 lb 9.6 oz) 61.054 kg (134 lb 9.6 oz) 58.8 kg (129 lb 10.1 oz)    Exam:   General:  No distress. [name] tube logged overnight (after TF rate increased); no fever, no vomiting.  Cardiovascular: S 1, S 2 RRR; positive SEM  Respiratory:CTA bilaterally; no wheezing, no rales  Abdomen: BS present, soft. No tenderness   Musculoskeletal: no edema.   Data Reviewed: Basic Metabolic Panel:  Recent Labs Lab 07/30/14 0805 07/31/14 0340 08/01/14 0310 08/03/14 0404 08/05/14 0411  NA 133* 130* 136* 142 137  K 4.1 4.3 4.2 3.8 3.3*  CL 95* 94* 96 102 101  CO2 25 24 25 25 22   GLUCOSE 108* 109* 178* 103* 109*  BUN 13 12 15  24* 18  CREATININE 0.66 0.67 0.62 0.63 0.50  CALCIUM 8.5 8.6 8.5 8.5 8.4  MG  --   --   --  2.6*  --    CBC:  Recent Labs Lab 07/30/14 0805 07/31/14 0340 08/01/14 0310 08/03/14 0404 08/05/14 0411  WBC 9.7 9.3 5.9 6.9 7.2  HGB 12.6 12.5 12.5 11.9* 12.4  HCT 37.3 37.5 37.7 37.3 38.0  MCV 86.5 86.6 87.7 89.2 89.4  PLT 212 188 167 194 164   Cardiac Enzymes:  Recent Labs Lab 07/30/14 1055  TROPONINI <0.30   BNP (last 3 results)  Recent Labs  07/19/14 2039 07/28/14 1225 07/29/14 0330  PROBNP 1467.0* 576.5* 753.4*   CBG:  Recent Labs Lab 08/04/14 1704 08/04/14 2008 08/04/14 2348 08/05/14 0359 08/05/14 0728  GLUCAP 172* 177* 120* 100* 92    Recent Results (from the past 240 hour(s))  Blood culture (routine x 2)     Status: None   Collection Time: 07/28/14 12:21 PM  Result Value Ref Range Status   Specimen Description BLOOD LEFT WRIST  4 ML IN [facility] BOTTLE  Final   Special Requests NONE  Final   Culture  Setup Time   Final    07/28/2014 20:11 Performed at [facility]    Culture   Final    NO GROWTH 5 DAYS Performed at [facility]    Report Status 08/03/2014 FINAL  Final  Blood culture  (routine x 2)     Status: None   Collection Time: 07/28/14 12:49 PM  Result Value Ref Range Status   Specimen Description BLOOD BLOOD LEFT FOREARM  Final   Special Requests BOTTLES DRAWN AEROBIC AND ANAEROBIC 5ML  Final   Culture  Setup Time   Final    07/28/2014 20:10 Performed at [facility]    Culture   Final    NO GROWTH 5 DAYS Performed at [facility]    Report Status 08/03/2014 FINAL  Final  Urine culture     Status: None   Collection Time: 07/28/14  2:03 PM  Result Value Ref Range Status   Specimen Description URINE, CLEAN CATCH  Final   Special Requests NONE  Final   Culture  Setup Time   Final    07/29/2014 02:28 Performed at [facility]   Final    30,000 COLONIES/ML Performed at [facility]  Culture   Final    STAPHYLOCOCCUS SPECIES (COAGULASE NEGATIVE) Note: RIFAMPIN AND GENTAMICIN SHOULD NOT BE USED AS SINGLE DRUGS FOR TREATMENT OF STAPH INFECTIONS. Performed at [facility]    Report Status 07/31/2014 FINAL  Final   Organism ID, Bacteria STAPHYLOCOCCUS SPECIES (COAGULASE NEGATIVE)  Final      Susceptibility   Staphylococcus species (coagulase negative) - MIC*    GENTAMICIN <=0.5 SENSITIVE Sensitive     LEVOFLOXACIN >=8 RESISTANT Resistant     NITROFURANTOIN <=16 SENSITIVE Sensitive     OXACILLIN >=4 RESISTANT Resistant     PENICILLIN >=0.5 RESISTANT Resistant     RIFAMPIN <=0.5 SENSITIVE Sensitive     TRIMETH/SULFA <=10 SENSITIVE Sensitive     VANCOMYCIN 1 SENSITIVE Sensitive     TETRACYCLINE <=1 SENSITIVE Sensitive     * STAPHYLOCOCCUS SPECIES (COAGULASE NEGATIVE)  MRSA PCR Screening     Status: None   Collection Time: 07/28/14  3:52 PM  Result Value Ref Range Status   MRSA by PCR NEGATIVE NEGATIVE Final    Comment:        The GeneXpert MRSA Assay (FDA approved for NASAL specimens only), is one component of a comprehensive MRSA colonization surveillance program. It is not intended to  diagnose MRSA infection nor to guide or monitor treatment for MRSA infections.  Culture, expectorated sputum-assessment     Status: None   Collection Time: 07/28/14  4:21 PM  Result Value Ref Range Status   Specimen Description SPUTUM  Final   Special Requests Immunocompromised  Final   Sputum evaluation   Final    THIS SPECIMEN IS ACCEPTABLE. RESPIRATORY CULTURE REPORT TO FOLLOW.   Report Status 07/28/2014 FINAL  Final  Culture, respiratory (NON-Expectorated)     Status: None   Collection Time: 07/28/14  4:21 PM  Result Value Ref Range Status   Specimen Description SPUTUM  Final   Special Requests NONE  Final   Gram Stain   Final    NO WBC SEEN FEW SQUAMOUS EPITHELIAL CELLS PRESENT MODERATE GRAM POSITIVE RODS FEW GRAM NEGATIVE RODS FEW GRAM POSITIVE COCCI IN PAIRS    Culture   Final    NORMAL OROPHARYNGEAL FLORA Performed at [facility]    Report Status 07/31/2014 FINAL  Final  Ova and parasite examination     Status: None   Collection Time: 07/29/14  7:28 PM  Result Value Ref Range Status   Specimen Description STOOL  Final   Special Requests NONE  Final   Ova and parasites   Final    NO OVA OR PARASITES SEEN NO STRONGYLOIDES STERCORALIS SEEN Performed at [facility]    Report Status 07/31/2014 FINAL  Final     Studies: No results found.  Scheduled Meds: . antiseptic oral rinse  7 mL Mouth Rinse q12n4p  . aspirin  150 mg Rectal Daily  . chlorhexidine  15 mL Mouth Rinse BID  . enoxaparin (LOVENOX) injection  40 mg Subcutaneous Q24H  . fluconazole (DIFLUCAN) IV  100 mg Intravenous Daily  . folic acid  1 mg Intravenous Daily  . furosemide  20 mg Per Tube BID  . metoprolol tartrate  6.25 mg Per Tube BID  . pantoprazole sodium  40 mg Per Tube BID  . polyvinyl alcohol  1 drop Both Eyes Daily  . potassium chloride  40 mEq Per Tube Daily  . prednisoLONE  5.1 mg Per Tube QAC breakfast  . sodium chloride  3 mL Intravenous Q12H   Continuous  Infusions: . feeding supplement (VITAL AF 1.2 CAL) Stopped (08/05/14 0015)    Principal Problem:   Sepsis Active Problems:   Hyponatremia   Immune disorder   Fever   Diastolic HF (heart failure)   Chronic diastolic heart failure   Acute respiratory failure with hypoxemia   Dyspnea   Pyrexia   Hypoxemia   Pulmonary infiltrate   Malnutrition of moderate degree   Aspiration into airway   Failure to thrive in adult   Blood poisoning    Time spent: 30 minutes.     Barton Dubois  [facility] Pager [phone]. If 7PM-7AM, please contact night-coverage at [email or web address], password [facility] 08/05/2014, 10:51 AM  LOS: 8 days

## 2014-08-05 NOTE — Progress Notes (Signed)
NUTRITION FOLLOW UP  Intervention:   -When appropriate re-initiate Vital AF 1.2 at 20 ml and advance by 10 ml every 4 hours to goal rate of 50 ml/hr.  -Tube feeding regimen provides 1440 kcal (96% of needs), 90 grams of protein, and 1071 ml of H2O.  -RD to continue to monitor  Nutrition Dx:   Inadequate oral intake related to decreased appetite/taste changes/nausea as evidenced by PO intake < 75%.; ongoing, now related to NPO status  Goal:   TF to meet >/= 90% of their estimated nutrition needs, unmet TF stopped  Monitor:   TF re-initiation, PEG placement?, swallow profile, total protein/energy intake, labs, weights, skin integrity  Assessment:   80 y.o. female with PMH significant for Polymyalgia on prednisone, severely calcified mitral valve with moderate stenosis, chronic diastolic heart failure,Gangrenous toes on the right foot , last admission 10-27 was treated for health care associated PNA, encephalopathy, and diastolic HF exacerbation.   -[name] tube was placed on 11/05 PM -WOC eval on 11/02; noted 2nd and 3rd toes gangrenous and stg 2 decub sacral ulcer; minimal drainage  Pt was receiving Vital AF 1.2 at 30 ml/hr providing 864 kcal (58% est kcal needs),  54 gram protein (68% est protein need), 584 ml free water. Goal rate is 50 ml/hr.  Tube feeding is in hold d/t panda tube continually becoming occluded, RN unable to manually flush.   PEG tube may replace panda. PEG placement is possible today pending family decision.  Labs reviewed: CBGs improving Low K  Height: Ht Readings from Last 1 Encounters:  07/28/14 5\' 1"  (1.549 m)    Weight Status:   Wt Readings from Last 1 Encounters:  08/05/14 129 lb 10.1 oz (58.8 kg)  07/30/14 146 lb  Re-estimated needs (from new weight):  Kcal: 1500-1700 Protein: 80-95 gram Fluid: >/= 1600 ml daily  Skin: stage 2 PU on buttock, gangreneous toes on right foot  Diet Order: Diet NPO time specified Except for: Ice  Chips   Intake/Output Summary (Last 24 hours) at 08/05/14 1014 Last data filed at 08/05/14 0600  Gross per 24 hour  Intake      0 ml  Output   1400 ml  Net  -1400 ml    Last BM: 11/6   Labs:   Recent Labs Lab 08/01/14 0310 08/03/14 0404 08/05/14 0411  NA 136* 142 137  K 4.2 3.8 3.3*  CL 96 102 101  CO2 25 25 22   BUN 15 24* 18  CREATININE 0.62 0.63 0.50  CALCIUM 8.5 8.5 8.4  MG  --  2.6*  --   GLUCOSE 178* 103* 109*    CBG (last 3)   Recent Labs  08/04/14 2348 08/05/14 0359 08/05/14 0728  GLUCAP 120* 100* 92    Scheduled Meds: . antiseptic oral rinse  7 mL Mouth Rinse q12n4p  . aspirin  150 mg Rectal Daily  . chlorhexidine  15 mL Mouth Rinse BID  . enoxaparin (LOVENOX) injection  40 mg Subcutaneous Q24H  . fluconazole (DIFLUCAN) IV  100 mg Intravenous Daily  . folic acid  1 mg Intravenous Daily  . furosemide  20 mg Per Tube BID  . metoprolol tartrate  6.25 mg Per Tube BID  . pantoprazole sodium  40 mg Per Tube BID  . polyvinyl alcohol  1 drop Both Eyes Daily  . potassium chloride  40 mEq Per Tube Daily  . prednisoLONE  5.1 mg Per Tube QAC breakfast  . sodium chloride  3 mL  Intravenous Q12H    Continuous Infusions: . feeding supplement (VITAL AF 1.2 CAL) Stopped (08/05/14 0015)    Clayton Bibles, MS, RD, LDN Pager: [phone] After Hours Pager: [phone]

## 2014-08-05 NOTE — Progress Notes (Signed)
[name] tube continually reading occluded, unable to manually flush. On-call, [place] notified. Instructed to turn off feed and pass along to day shift for follow-up.

## 2014-08-05 NOTE — Consult Note (Signed)
Chief Complaint: Chief Complaint  Patient presents with  . Low O2 saturation   . Hypoxia   Dysphagia Malnutrition   Referring Physician(s): TRH  History of Present Illness: Martha Mcbride is a 80 y.o. female  Hx polymyalgia rheumatica Hx Breast Ca Admitted with Hypoxia Aspiration Pneumonia  Dysphagia; malnutrition NG and [name] difficult and extremely uncomfortable Using pediatric panda- hiatal hernia causing issue with position Unable to set nutrition rate at appropriate level Request for percutaneous gastric tube placement Dr Pascal Lux and Dr Laurence Ferrari have reviewed imaging Feel anatomy is appropriate to attempt procedure I have seen and examined pt Scheduled for same 11/9 in IR   Past Medical History  Diagnosis Date  . Arthritis   . Polymyalgia   . Acid reflux   . Immune deficiency disorder     autoimmune disorder - polymyalgia - on prednisone  . Breast cancer     Treated with lumpectomy and radiation  . Giardia   . Pinworms     Past Surgical History  Procedure Laterality Date  . Breast lumpectomy    . Partial hysterectomy    . Tonsillectomy    . Tee without cardioversion N/A 07/20/2014    Procedure: TRANSESOPHAGEAL ECHOCARDIOGRAM (TEE);  Surgeon: Candee Furbish, MD;  Location: [facility] ENDOSCOPY;  Service: Cardiovascular;  Laterality: N/A;    Allergies: Doxycycline; Other; Penicillins; and Sulfa antibiotics  Medications: Prior to Admission medications   Medication Sig Start Date End Date Taking? Authorizing Provider  antiseptic oral rinse (BIOTENE) LIQD 15 mLs by Mouth Rinse route as needed for dry mouth.   Yes Historical Provider, MD  aspirin 81 MG chewable tablet Chew 1 tablet (81 mg total) by mouth daily. 07/24/14  Yes Barton Dubois, MD  benzonatate (TESSALON) 200 MG capsule Take 1 capsule (200 mg total) by mouth 3 (three) times daily as needed for cough. 07/24/14  Yes Barton Dubois, MD  fluconazole (DIFLUCAN) 100 MG tablet Take 100 mg by mouth daily.   07/09/14  Yes Historical Provider, MD  folic acid (FOLVITE) 1 MG tablet Take 1 mg by mouth daily.   Yes Historical Provider, MD  furosemide (LASIX) 20 MG tablet Take 1 tablet (20 mg total) by mouth 2 (two) times daily with breakfast and lunch. 07/24/14  Yes Barton Dubois, MD  hydroxypropyl methylcellulose / hypromellose (ISOPTO TEARS / GONIOVISC) 2.5 % ophthalmic solution Place 1 drop into both eyes every morning.   Yes Historical Provider, MD  levofloxacin (LEVAQUIN) 750 MG tablet Take 1 tablet (750 mg total) by mouth every other day. 07/25/14  Yes Barton Dubois, MD  loratadine (CLARITIN) 10 MG tablet Take 1 tablet (10 mg total) by mouth daily. 07/24/14  Yes Barton Dubois, MD  metoprolol succinate (TOPROL-XL) 12.5 mg TB24 24 hr tablet Take 0.5 tablets (12.5 mg total) by mouth daily. 07/24/14  Yes Barton Dubois, MD  potassium chloride SA (K-DUR,KLOR-CON) 20 MEQ tablet Take 2 tablets (40 mEq total) by mouth daily. 07/24/14  Yes Barton Dubois, MD  predniSONE (DELTASONE) 5 MG tablet Take 5 mg by mouth daily with breakfast.   Yes Historical Provider, MD  RABEprazole (ACIPHEX) 20 MG tablet Take 20 mg by mouth daily.   Yes Historical Provider, MD  saccharomyces boulardii (FLORASTOR) 250 MG capsule Take 1 capsule (250 mg total) by mouth 2 (two) times daily. 07/24/14  Yes Barton Dubois, MD    Family History  Problem Relation Age of Onset  . Stroke Mother     History   Social History  .  Marital Status: Widowed    Spouse Name: N/A    Number of Children: 4  . Years of Education: N/A   Social History Main Topics  . Smoking status: Never Smoker   . Smokeless tobacco: None  . Alcohol Use: No  . Drug Use: No  . Sexual Activity: None   Other Topics Concern  . None   Social History Narrative    Review of Systems: A 12 point ROS discussed and pertinent positives are indicated in the HPI above.  All other systems are negative.  Review of Systems  Constitutional: Positive for activity change,  appetite change and unexpected weight change. Negative for fever.  HENT: Positive for hearing loss.   Respiratory: Negative for cough and shortness of breath.   Cardiovascular: Negative for chest pain.  Gastrointestinal: Negative for abdominal pain.  Genitourinary: Negative for difficulty urinating.  Psychiatric/Behavioral: Negative for behavioral problems and confusion.    Vital Signs: BP 142/61 mmHg  Pulse 81  Temp(Src) 98.3 F (36.8 C) (Axillary)  Resp 18  Ht 5\' 1"  (1.549 m)  Wt 58.8 kg (129 lb 10.1 oz)  BMI 24.51 kg/m2  SpO2 99%  Physical Exam  Constitutional: She is oriented to person, place, and time.  Cardiovascular: Normal rate and regular rhythm.   No murmur heard. Pulmonary/Chest: Effort normal and breath sounds normal. No respiratory distress. She has no wheezes.  Abdominal: Soft. Bowel sounds are normal. There is no tenderness.  Neurological: She is alert and oriented to person, place, and time.  Skin: Skin is warm and dry.  Psychiatric: She has a normal mood and affect. Her behavior is normal. Thought content normal.  Nursing note and vitals reviewed.   Imaging: Dg Chest 2 View  07/28/2014   CLINICAL DATA:  Worsening hypoxia and lethargy. Recent pneumonia. Breast carcinoma.  EXAM: CHEST  2 VIEW  COMPARISON:  07/23/2014  FINDINGS: Diffuse pulmonary interstitial infiltrates show no significant change. No evidence of superimposed consolidation or pleural effusion. Cardiomegaly remains stable.  IMPRESSION: No significant change in cardiomegaly and diffuse bilateral pulmonary interstitial infiltrates.   Electronically Signed   By: Earle Gell M.D.   On: 07/28/2014 13:04   Dg Chest 2 View  07/23/2014   CLINICAL DATA:  Persistent difficulty breathing and cough  EXAM: CHEST  2 VIEW  COMPARISON:  July 19, 2014 and June 02, 2014  FINDINGS: There is underlying emphysema. There is diffuse interstitial edema. Heart is mildly enlarged. The pulmonary vascularity reflects  pulmonary venous congestion superimposed on emphysematous change. There are compression fractures in the thoracic spine, stable. No adenopathy. There is atherosclerotic change in the aorta.  IMPRESSION: Findings are felt to be most consistent with congestive heart failure superimposed on emphysematous change. There is no frank airspace consolidation. No new opacity compared to most recent prior study.   Electronically Signed   By: Lowella Grip M.D.   On: 07/23/2014 08:53   Dg Chest 2 View  07/19/2014   CLINICAL DATA:  Cough and weakness.  History of breast cancer.  EXAM: CHEST  2 VIEW  COMPARISON:  06/02/2014.  FINDINGS: Poor inspiration. Mildly enlarged cardiac silhouette with an interval decrease in size. Interval patchy airspace opacity throughout the majority of both lungs. No definite pleural fluid. Diffuse osteopenia. Stable thoracolumbar spine compression deformity and atheromatous arterial calcifications.  IMPRESSION: Poor inspiration with interval diffuse bilateral pneumonia or alveolar edema.   Electronically Signed   By: Enrique Sack M.D.   On: 07/19/2014 21:30   Dg  Abd 1 View  08/02/2014   CLINICAL DATA:  Nasogastric tube placement.  EXAM: ABDOMEN - 1 VIEW  COMPARISON:  CT abdomen and pelvis 05/21/2012.  Chest CT 07/30/2014.  FINDINGS: Enteric tube is looped over the stomach with distal end directed cranially and tip projecting over the lower mediastinum. Recent chest CT demonstrates a hiatal hernia in this location. Oral contrast material is present in the ascending and transverse colon. No dilated loops of bowel are seen to suggest obstruction. There is chronic elevation of the right hemidiaphragm. Dense mitral annular calcifications partially visualized. Mild lumbar levoscoliosis is noted.  IMPRESSION: Enteric tube looped in the stomach with tip directed cranially over the lower mediastinum, likely in the gastric fundus in the herniated portion of the stomach. Consider retracting 10-15 cm  to reduce the amount of excess tube in the stomach and place the tip in the gastric body.  These results will be called to the ordering clinician or representative by the Radiologist Assistant, and communication documented in the PACS or zVision Dashboard.   Electronically Signed   By: Logan Bores   On: 08/02/2014 10:35   Ct Chest Wo Contrast  07/30/2014   CLINICAL DATA:  Fever and elevated white blood cell count. Chronic shortness of breath and chest pain. History of breast carcinoma in 2000  EXAM: CT CHEST WITHOUT CONTRAST  TECHNIQUE: Multidetector CT imaging of the chest was performed following the standard protocol without IV contrast material administration.  COMPARISON:  Chest radiograph August 08, 2014  FINDINGS: There is underlying emphysematous change with interstitial fibrosis and honeycombing throughout both upper lobes, primarily medially as well as in the lingula, right middle lobe, and both lower lobes. There is diffuse traction type bronchiectasis.  There are moderate free-flowing effusions bilaterally. There are areas of consolidation in both lower lobes in a somewhat patchy distribution. Lesser degrees of airspace consolidation are noted in both upper lobes.  On axial slice 32 series 5, there is a 5 mm nodular opacity in the posterior segment of the right upper lobe.  There is atherosclerotic change in the aorta but no aneurysm. No pulmonary embolus is seen on this noncontrast enhanced study. There is extensive calcification in the mitral valve and aortic valve regions.  There are multiple small lymph nodes throughout the mediastinum. There is a lymph node in the anterior precarinal region measuring 1.6 by 1.4 cm. A second lymph node in this area measures 2.0 x 1.2 cm. A lymph node in the aortopulmonary window region measures 1.5 x 1.3 cm.  There is a moderate hiatal hernia.  In the visualized upper abdominal region, there is atherosclerotic change in the aorta. There is cholelithiasis. There  is a small cyst in the posterior segment of the right lobe of the liver.  There is degenerative change in the thoracic spine with increase in kyphosis. There are no blastic or lytic bone lesions. Thyroid appears unremarkable.  IMPRESSION: Evidence of emphysema with traction bronchiectasis in multiple areas of interstitial fibrosis. This appearance is consistent with underlying usual interstitial pneumonitis.  There are areas of patchy consolidation in both lower lobes as well as to a lesser extent in the upper lobes. There also moderate pleural effusions. The areas of consolidation are felt to most likely represent superimposed pneumonia. There may be a degree of superimposed congestive heart failure as well given the pleural effusions.  5 mm nodular opacity in the posterior segment of the right upper lobe. Followup of this nodular opacity should be based on  Fleischner Society guidelines. If the patient is at high risk for bronchogenic carcinoma, follow-up chest CT at 6-12 months is recommended. If the patient is at low risk for bronchogenic carcinoma, follow-up chest CT at 12 months is recommended. This recommendation follows the consensus statement: Guidelines for Management of Small Pulmonary Nodules Detected on CT Scans: A Statement from the [employer] as published in Radiology 2005;237:395-400.  Areas of atherosclerotic change and cardiac valvular calcification.  Areas of relatively mild adenopathy.  Cholelithiasis.  Given the areas of questionable pneumonia, it may be prudent to consider a followup study in 2-3 weeks to assess for clearing.   Electronically Signed   By: Lowella Grip M.D.   On: 07/30/2014 11:39   Dg Chest Port 1 View  07/29/2014   CLINICAL DATA:  Hypoxia.  History of breast cancer.  EXAM: PORTABLE CHEST - 1 VIEW  COMPARISON:  07/28/2014.  FINDINGS: Mediastinum hilar structures normal. Mild cardiomegaly. Diffuse severe from interstitial infiltrates are present. Small right  pleural effusion. These findings could be related to congestive heart for and interstitial edema severe pneumonitis could also present in this fashion. Interstitial tumor spread could present in this fashion. No acute bony abnormality identified.  IMPRESSION: 1. Severe bilateral pulmonary interstitial infiltrates. Small right pleural effusion. These findings may be related to congestive heart failure an interstitial edema. Severe interstitial pneumonitis could present in this fashion. Interstitial tumor spread could present in this fashion. 2. Cardiomegaly.   Electronically Signed   By: Marcello Moores  Register   On: 07/29/2014 08:25   Dg Ander Gaster Tube  08/02/2014   CLINICAL DATA:  Feeding tube placement  EXAM: INTRO LONG GI TUBE  TECHNIQUE: Fluoroscopic guidance was used to in the placement of a feeding tube.  CONTRAST:  76mL OMNIPAQUE IOHEXOL 300 MG/ML  SOLN  FLUOROSCOPY TIME:  18 min 19 seconds  COMPARISON:  08/02/2014.  FINDINGS: The patient present to the radiology department for image guided feeding tube placement. Upon initial presentation the patient had a nasogastric tube in place which was coiled in the patient's large hiatal hernia. This was removed and a guidewire was placed into the gastric lumen. The weighted feeding tube was then advanced over the guidewire into the distal esophagus. Multiple attempts were made to pass the feeding tube through the large hiatal hernia and into the stomach requiring extended fluoroscopy time. After multiple attempts the feeding tube was advanced into the stomach and subsequently post pyloric. Water-soluble contrast material was injected through the feeding tube confirming post pyloric placement.  IMPRESSION: 1. Technically successful post pyloric placement of a weighted feeding tube. 2. Large hiatal hernia.   Electronically Signed   By: Kerby Moors M.D.   On: 08/02/2014 19:07   Dg Foot Complete Right  07/19/2014   CLINICAL DATA:  Patient reports the second and  third post appeared darkened and necrosis, started 1 week ago.  EXAM: RIGHT FOOT COMPLETE - 3+ VIEW  COMPARISON:  None.  FINDINGS: There is no evidence of fracture or dislocation. There is no evidence of osteomyelitis. There is soft tissue thinning in the distal second digit.  IMPRESSION: No evidence of osteomyelitis. Soft tissue thinning in the distal second digit.   Electronically Signed   By: Abelardo Diesel M.D.   On: 07/19/2014 21:26   Dg Swallowing Func-speech Pathology  07/31/2014   Macario Golds, CCC-SLP     07/31/2014  1:52 PM Objective Swallowing Evaluation: Modified Barium Swallowing Study   Patient Details  Name: Gracelyn  BERTINA GUTHRIDGE MRN: [id number] Date of Birth: 1925/05/12  Today's Date: 07/31/2014 Time: 1245-1315 SLP Time Calculation (min): 30 min  Past Medical History:  Past Medical History  Diagnosis Date  . Arthritis   . Polymyalgia   . Acid reflux   . Immune deficiency disorder     autoimmune disorder - polymyalgia - on prednisone  . Breast cancer     Treated with lumpectomy and radiation  . Giardia   . Pinworms    Past Surgical History:  Past Surgical History  Procedure Laterality Date  . Breast lumpectomy    . Partial hysterectomy    . Tonsillectomy    . Tee without cardioversion N/A 07/20/2014    Procedure: TRANSESOPHAGEAL ECHOCARDIOGRAM (TEE);  Surgeon: Candee Furbish, MD;  Location: [facility] ENDOSCOPY;  Service: Cardiovascular;   Laterality: N/A;   HPI:  80 y/o female with polymyalgia on chronic prednisone (5mg ) and  leflunomide admitted on 10/31 to [facility] with acute hypoxemic  respiratory failure and diffuse bilateral infiltrates per MD  note.  Pt also with recent Eosinophilia, CHF, acid reflux,  parasitic diagnosis - giardia, pinworm, roundworms.  Order for  swallow evaluation received and daughter [name] questioned if pt  may dysphagia.  Pt denies dysphagia but admits to "memory  problem" and occasional choking.  She does take her pills with  applesauce premorbidly - daughter [name] via speaker phone stated   this was only due to pt consuming a full cup of water with pills  and complaining of being full - therefore [name] recommended pt  take pills with applesauce.  CT Chest 11/2 showed "Evidence of  emphysema with traction bronchiectasis in multiple areas of  interstitial fibrosis. This appearance is consistent with  underlying usual interstitial pneumonitis. There are areas of  patchy consolidation in both lower lobes as well as to a lesser  extent in the upper lobes. There also moderate pleural effusions.  The areas of consolidation are felt to most likely represent  superimposed pneumonia. There may be a degree of superimposed  congestive heart failure as well given the pleural effusions. 5  mm nodular opacity in the posterior segment of the right upper  lobe.  Areas of atherosclerotic change and cardiac valvular  calcification."     Assessment / Plan / Recommendation Clinical Impression  Dysphagia Diagnosis: Moderate oral phase dysphagia;Moderate  pharyngeal phase dysphagia;Moderate cervical esophageal phase  dysphagia  Clinical impression: Moderate oropharyngeal and suspected  cervical esophageal dysphagia with + aspiration or penetration  with each consistency tested.  Sensorimotor deficits noted  impacting swallow ability.   Premature spillage of boluses into pharynx noted d/t weakness and  compromised laryngeal elevation resulted in + aspiration of  nectar *mixing with secretions* before the swallow.  She senses  penetration/aspiration intermittently and can expectorate but was  noted to be excessively fatigued and mildly dyspenic after just a  few tsps of intake.  Further boluses were re-penetrated or  aspirated.  Moderate vallecular residuals also noted with pudding  and cracker more than liquids WITHOUT pt awareness.  Pt also is  aspirating secretions that mix with barium.     Pt is at high aspiration risk currently and recommend she be NPO  except small single ice chips after oral care.  ? If aspiration  could  be contributing to pt's fevers, pneumonia.  Daughter [name]  present during testing and both pt/daughter educated to findings.     Further informed [name] that feeding tubes do not prevent  aspiration.  SLP uncertain of pt's swallow prognosis given ?  source of oropharyngeal dysphagia and pt's deconditioning and  ongoing illness x2 months per [name]. Will follow up to help with  care plan for to determine readiness for repeat evaluation if  indicated.     Treatment Recommendation  Therapy as outlined in treatment plan below    Diet Recommendation NPO;Ice chips PRN after oral care (single  small ice chips after oral care pending plans)        Other  Recommendations Oral Care Recommendations: Oral care Q4  per protocol   Follow Up Recommendations    TBD   Frequency and Duration min 1 x/week  1 week   Pertinent Vitals/Pain Febrile, congested     General Date of Onset: 07/30/14 HPI: 80 y/o female with polymyalgia on chronic prednisone (5mg )  and leflunomide admitted on 10/31 to [facility] with acute hypoxemic  respiratory failure and diffuse bilateral infiltrates per MD  note.  Pt also with recent Eosinophilia, CHF, acid reflux,  parasitic diagnosis - giardia, pinworm, roundworms.  Order for  swallow evaluation received and daughter [name] questioned if pt  may dysphagia.  Pt denies dysphagia but admits to "memory  problem" and occasional choking.  She does take her pills with  applesauce premorbidly - daughter [name] via speaker phone stated  this was only due to pt consuming a full cup of water with pills  and complaining of being full - therefore [name] recommended pt  take pills with applesauce.  CT Chest 11/2 showed "Evidence of  emphysema with traction bronchiectasis in multiple areas of  interstitial fibrosis. This appearance is consistent with  underlying usual interstitial pneumonitis. There are areas of  patchy consolidation in both lower lobes as well as to a lesser  extent in the upper lobes. There also moderate pleural  effusions.  The areas of consolidation are felt to most likely represent  superimposed pneumonia. There may be a degree of superimposed  congestive heart failure as well given the pleural effusions. 5  mm nodular opacity in the posterior segment of the right upper  lobe.  Areas of atherosclerotic change and cardiac valvular  calcification." Type of Study: Modified Barium Swallowing Study Reason for Referral: Objectively evaluate swallowing function  (rule out overt aspiration) Diet Prior to this Study: Thin liquids Temperature Spikes Noted: Yes Respiratory Status: Room air History of Recent Intubation: No Behavior/Cognition: Alert;Cooperative;Pleasant mood Oral Cavity - Dentition: Dentures, top (lower partial) Oral Motor / Sensory Function: Impaired - see Bedside swallow  eval Self-Feeding Abilities: Able to feed self;Needs set up Patient Positioning: Upright in chair Baseline Vocal Quality: Clear [name] reports voice will change  intermittently  - likely due to excessive coughing) Volitional Cough: Strong (productive) Volitional Swallow: Able to elicit Anatomy: Within functional limits Pharyngeal Secretions: Not observed secondary MBS    Reason for Referral Objectively evaluate swallowing function  (rule out overt aspiration)   Oral Phase Oral Preparation/Oral Phase Oral Phase: Impaired Oral - Honey Oral - Honey Teaspoon: Reduced posterior  propulsion;Lingual/palatal residue;Weak lingual manipulation Oral - Nectar Oral - Nectar Teaspoon: Reduced posterior  propulsion;Lingual/palatal residue;Weak lingual manipulation Oral - Thin Oral - Thin Teaspoon: Reduced posterior propulsion;Weak lingual  manipulation Oral - Thin Cup: Reduced posterior propulsion;Lingual/palatal  residue;Weak lingual manipulation Oral - Solids Oral - Puree: Reduced posterior propulsion;Lingual/palatal  residue;Weak lingual manipulation Oral - Regular: Reduced posterior propulsion;Weak lingual  manipulation Oral Phase - Comment Oral Phase -  Comment: premature spillage of barium into pharynx  with decreased  control   Pharyngeal Phase Pharyngeal Phase Pharyngeal Phase: Impaired Pharyngeal - Honey Pharyngeal - Honey Teaspoon: Premature spillage to  valleculae;Reduced anterior laryngeal mobility;Reduced laryngeal  elevation;Reduced airway/laryngeal closure;Reduced tongue base  retraction;Reduced epiglottic inversion;Reduced pharyngeal  peristalsis;Penetration/Aspiration before swallow;Pharyngeal  residue - valleculae;Pharyngeal residue - cp segment Penetration/Aspiration details (honey teaspoon): Material enters  airway, CONTACTS cords and not ejected out Pharyngeal - Nectar Pharyngeal - Nectar Teaspoon: Premature spillage to  valleculae;Reduced anterior laryngeal mobility;Reduced epiglottic  inversion;Reduced airway/laryngeal closure;Reduced laryngeal  elevation;Reduced tongue base retraction;Reduced pharyngeal  peristalsis;Trace aspiration;Penetration/Aspiration before  swallow;Penetration/Aspiration during swallow;Pharyngeal residue  - cp segment Penetration/Aspiration details (nectar teaspoon): Material enters  airway, passes BELOW cords without attempt by patient to eject  out (silent aspiration) Pharyngeal - Thin Pharyngeal - Thin Teaspoon: Reduced epiglottic inversion;Reduced  laryngeal elevation;Reduced airway/laryngeal closure;Reduced  anterior laryngeal mobility;Reduced pharyngeal  peristalsis;Pharyngeal residue - cp segment Pharyngeal - Thin Cup: Reduced anterior laryngeal  mobility;Reduced epiglottic inversion;Premature spillage to  pyriform sinuses;Reduced laryngeal elevation;Reduced  airway/laryngeal closure;Reduced pharyngeal peristalsis;Trace  aspiration;Penetration/Aspiration before swallow;Pharyngeal  residue - valleculae;Pharyngeal residue - cp segment Penetration/Aspiration details (thin cup): Material enters  airway, CONTACTS cords and not ejected out;Material enters  airway, passes BELOW cords without attempt by patient to eject  out  (silent aspiration) Pharyngeal - Solids Pharyngeal - Puree: Reduced pharyngeal peristalsis;Reduced  airway/laryngeal closure;Reduced epiglottic inversion;Reduced  tongue base retraction;Reduced laryngeal elevation;Premature  spillage to valleculae;Penetration/Aspiration before  swallow;Pharyngeal residue - cp segment Penetration/Aspiration details (puree): Material enters airway,  remains ABOVE vocal cords then ejected out Pharyngeal - Regular: Reduced tongue base retraction;Reduced  epiglottic inversion;Reduced anterior laryngeal  mobility;Premature spillage to valleculae;Reduced laryngeal  elevation;Reduced airway/laryngeal closure;Reduced pharyngeal  peristalsis;Pharyngeal residue - valleculae;Pharyngeal residue -  cp segment Pharyngeal Phase - Comment Pharyngeal Comment: pt without awareness to pharyngeal residuals  mixing with barium/secretions, cued dry swallows marginally  effective to decrease residuals, pt able to expectorate to clear  but caused significant fatigue for pt after just a few boluses,  chin tuck posture not effective (pt unable to fully conduct due  to neck discomfort)   Cervical Esophageal Phase    GO    Cervical Esophageal Phase Cervical Esophageal Phase: Impaired Cervical Esophageal Phase - Honey Honey Teaspoon: Reduced cricopharyngeal relaxation Cervical Esophageal Phase - Nectar Nectar Teaspoon: Reduced cricopharyngeal relaxation Cervical Esophageal Phase - Thin Thin Teaspoon: Reduced cricopharyngeal relaxation Thin Cup: Reduced cricopharyngeal relaxation Cervical Esophageal Phase - Solids Puree: Reduced cricopharyngeal relaxation Regular: Reduced cricopharyngeal relaxation Cervical Esophageal Phase - Comment Cervical Esophageal Comment: appearance of barium residuals in  mid-esophagus, cued dry swallow nor liquid swallows effective to  clear, radiologist not present to confirm and pt could not  undergo esophagram due to aspiration         Luanna Salk, MS [facility] SLP [phone]      Labs:  CBC:  Recent Labs  07/31/14 0340 08/01/14 0310 08/03/14 0404 08/05/14 0411  WBC 9.3 5.9 6.9 7.2  HGB 12.5 12.5 11.9* 12.4  HCT 37.5 37.7 37.3 38.0  PLT 188 167 194 164    COAGS:  Recent Labs  07/31/14 0340  INR 1.09    BMP:  Recent Labs  07/31/14 0340 08/01/14 0310 08/03/14 0404 08/05/14 0411  NA 130* 136* 142 137  K 4.3 4.2 3.8 3.3*  CL 94* 96 102 101  CO2 24 25 25 22   GLUCOSE 109* 178* 103* 109*  BUN 12 15 24* 18  CALCIUM 8.6 8.5 8.5 8.4  CREATININE 0.67 0.62 0.63 0.50  GFRNONAA 77* 79* 78*  85*  GFRAA 89* >90 >90 >90    LIVER FUNCTION TESTS:  Recent Labs  07/19/14 2039 07/28/14 1225  BILITOT 0.7 0.8  AST 13 18  ALT 15 24  ALKPHOS 100 119*  PROT 5.9* 6.5  ALBUMIN 2.7* 2.8*    TUMOR MARKERS: No results for input(s): AFPTM, CEA, CA199, CHROMGRNA in the last 8760 hours.  Assessment and Plan:  Aspiration pna Dysphagia; malnutrition Hx hiatal hernia---difficult to position NG/[name] Using Pediatric [name] now Need percutaneous gastric tube for longer term nutrition Scheduled now for same Pt and dtrs aware of procedure benefits and risks and agreeable to proceed Consent signed and in chart Lovenox Held Vanco on call  Thank you for this interesting consult.  I greatly enjoyed meeting Martha Mcbride and look forward to participating in their care.    I spent a total of 40 minutes face to face in clinical consultation, greater than 50% of which was counseling/coordinating care for Percutaneous gastric tube placement  Signed: Rakayla Ricklefs A 08/05/2014, 11:33 AM

## 2014-08-06 ENCOUNTER — Inpatient Hospital Stay (HOSPITAL_COMMUNITY): Payer: Medicare Other

## 2014-08-06 ENCOUNTER — Ambulatory Visit: Payer: Medicare Other | Admitting: Internal Medicine

## 2014-08-06 DIAGNOSIS — R1310 Dysphagia, unspecified: Secondary | ICD-10-CM

## 2014-08-06 LAB — GLUCOSE, CAPILLARY
GLUCOSE-CAPILLARY: 97 mg/dL (ref 70–99)
Glucose-Capillary: 114 mg/dL — ABNORMAL HIGH (ref 70–99)
Glucose-Capillary: 121 mg/dL — ABNORMAL HIGH (ref 70–99)
Glucose-Capillary: 133 mg/dL — ABNORMAL HIGH (ref 70–99)
Glucose-Capillary: 98 mg/dL (ref 70–99)

## 2014-08-06 MED ORDER — FENTANYL CITRATE 0.05 MG/ML IJ SOLN
INTRAMUSCULAR | Status: AC | PRN
  Administered 2014-08-06 (×2): 25 ug via INTRAVENOUS

## 2014-08-06 MED ORDER — JEVITY 1.2 CAL PO LIQD
1000.0000 mL | ORAL | Status: DC
Start: 2014-08-06 — End: 2014-08-06

## 2014-08-06 MED ORDER — MIDAZOLAM HCL 2 MG/2ML IJ SOLN
INTRAMUSCULAR | Status: AC | PRN
  Administered 2014-08-06: 0.5 mg via INTRAVENOUS
  Administered 2014-08-06: 1 mg via INTRAVENOUS

## 2014-08-06 MED ORDER — FENTANYL CITRATE 0.05 MG/ML IJ SOLN
INTRAMUSCULAR | Status: AC
Start: 2014-08-06 — End: 2014-08-06
  Filled 2014-08-06: qty 6

## 2014-08-06 MED ORDER — LIDOCAINE HCL 1 % IJ SOLN
INTRAMUSCULAR | Status: AC
  Filled 2014-08-06: qty 20

## 2014-08-06 MED ORDER — IOHEXOL 300 MG/ML  SOLN
20.0000 mL | Freq: Once | INTRAMUSCULAR | Status: AC | PRN
  Administered 2014-08-06: 10 mL

## 2014-08-06 MED ORDER — VITAL AF 1.2 CAL PO LIQD
1000.0000 mL | ORAL | Status: DC
  Administered 2014-08-07: 1000 mL
  Filled 2014-08-06 (×3): qty 1000

## 2014-08-06 MED ORDER — GLUCAGON HCL RDNA (DIAGNOSTIC) 1 MG IJ SOLR
INTRAMUSCULAR | Status: AC | PRN
  Administered 2014-08-06: 1 mg via INTRAVENOUS

## 2014-08-06 MED ORDER — KCL IN DEXTROSE-NACL 10-5-0.45 MEQ/L-%-% IV SOLN
INTRAVENOUS | Status: DC
  Administered 2014-08-06: 18:00:00 via INTRAVENOUS
  Filled 2014-08-06: qty 1000

## 2014-08-06 MED ORDER — GLUCAGON HCL RDNA (DIAGNOSTIC) 1 MG IJ SOLR
INTRAMUSCULAR | Status: AC
Start: 2014-08-06 — End: 2014-08-06
  Filled 2014-08-06: qty 1

## 2014-08-06 MED ORDER — MIDAZOLAM HCL 2 MG/2ML IJ SOLN
INTRAMUSCULAR | Status: AC
  Filled 2014-08-06: qty 6

## 2014-08-06 MED ORDER — FREE WATER
120.0000 mL | Freq: Four times a day (QID) | Status: DC
  Administered 2014-08-07 – 2014-08-10 (×11): 120 mL

## 2014-08-06 NOTE — Progress Notes (Signed)
OT Cancellation Note  Patient Details Name: Martha Mcbride MRN: [id number] DOB: [birth year]   Cancelled Treatment:    Reason Eval/Treat Not Completed: Patient at procedure or test/ unavailable. Pt out of room when OT checked by earlier this am. Will reattempt as schedule allows.  Jules Schick  [phone] 08/06/2014, 12:31 PM

## 2014-08-06 NOTE — Progress Notes (Signed)
[facility] PROGRESS NOTE  Martha Mcbride VOJ:[id number] DOB: October 04, 1924 DOA: 07/28/2014 PCP: Simona Huh, MD  -Sepsis: Patient present with fever, BP in the 100 to 90 range, Lactic acid at 3.5. Chest x ray with persistent infiltrates. WBC at 12.  -Vancomycin and cefepime initially, then discontinued. Unlikely Health care associated per ID (recommended treatment with clindamycin for aspiration; treatment completed while inpatient).  -Treating for Aspiration PNA. Patient has completed clindamycin therapy inpatient (11/06) -Blood culture no growth to date.  -sepsis has now resolved.  -Acute Hypoxic Respiratory Failure. IPF,  Aspiration PNA.  -Chest x ray 11-01 showed worsening infiltrates: pulmonary edema, Pneumonitis.  -CCM consulted for further evaluation of pneumonitis.   -Completed tx for PNA (aspiration) on 11/06  -Patient will benefit form home oxygen, specially at night; arranged through CM  -Sputum culture showed normal oro pharyngeal flora (suggesting aspiration as well).  -Discussed with Dr. Elsworth Soho no need for inpatient bronchoscopy, ok to use steroid -no fever, normal WBC's   -Dysphagia, oropharyngeal, component of cervical esophageal;  -Family Agree with PEG tube feeding; as panda tube was clogging and unable to provide desirable rate for nutrition. -Will need to have swallowing reassess in 10-14 days.  -GI on board -Will d/c home in 1-2 days with TF's and SPT; follow up with GI as an outpatient. 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 ID consulted for treatment recommendation and further evaluation. But doubt she had any of this; ID and pulmonary in agreement. -Of note patient had stool culture negative for parasites last admission.  -Ova and parasite stool cx repeated on this admission and were negative as well.  -strongyloids  antibody negative.  -Quantiferon Tb negative -Per ID unlikely to be parasitic infection.   -Chronic Diastolic HF; Appears compensated.  -BNP at 576, last admission at 1467. Patient appears compensated.  -Chest x ray with worsening infiltrates and pneumonitis.  -will resume lasix, metoprolol, and potassium oral solution through PEG tube (11/10 once is ready to be use) -gentle hydration overnight will be provided.  -Polymyalgia Rheumatica:  -On prednisone 5 mg daily at home. -Change to oral solution methylprednisolone same dose as receiving at home, 5mg ); to be resume tomorrow 11/10 once PEG able to be use.  -leflunomide was discontinue during last admission and would keep it like until follow up with rheumatology service.   -Gangrenous toes on the right foot  -Unclear etiology at this time, pulses on the right foot palpable with good perfusion  -Follow up with vascular outpatient.  -stable.  -wound care following. Continue local care.   -Decubitus ulcer  -Wound care consulted. -Appreciate recommendation.   -protein calorie Malnutrition, severe -s/p PEG on 11/9 by IR -continue following instruction from nutrition service for TF (which will start tomorrow 11/10) -continue Ice chips   Appreciate consultants help and assistance; will follow rec's.   Code Status: Full Code.  Family Communication: Care discussed with Daughter at bedside Disposition Plan: transition meds to PO; hopefully home in 1-2 days   Consultants:  Pulmonary  ID.   IR (for PEG placement)  Procedures:  none  Antibiotics:  Vancomycin 10-31---11-02  Cefepime 10-31----11-03  Clindamycin 11-03>>11/06  HPI/Subjective: Patient denies SOB or CP. TF on hold until PEG tube clear to be use  Objective: Filed Vitals:   08/06/14 1500  BP: 142/65  Pulse: 81  Temp: 97.8 F (36.6 C)  Resp: 20  Intake/Output Summary (Last 24 hours) at 08/06/14 1725 Last data filed at 08/06/14 1300  Gross per  24 hour  Intake      0 ml  Output    200 ml  Net   -200 ml   Filed Weights   08/04/14 0457 08/05/14 0427 08/06/14 0518  Weight: 61.054 kg (134 lb 9.6 oz) 58.8 kg (129 lb 10.1 oz) 57.9 kg (127 lb 10.3 oz)    Exam:   General:  No distress. S/p placement of PEG tube (unable to use until tomorrow around 11:00 am). No fever, no vomiting.  Cardiovascular: S 1, S 2 RRR; positive SEM  Respiratory:CTA bilaterally; no wheezing, no rales  Abdomen: BS present, soft. No tenderness   Musculoskeletal: no edema.   Data Reviewed: Basic Metabolic Panel:  Recent Labs Lab 07/31/14 0340 08/01/14 0310 08/03/14 0404 08/05/14 0411  NA 130* 136* 142 137  K 4.3 4.2 3.8 3.3*  CL 94* 96 102 101  CO2 24 25 25 22   GLUCOSE 109* 178* 103* 109*  BUN 12 15 24* 18  CREATININE 0.67 0.62 0.63 0.50  CALCIUM 8.6 8.5 8.5 8.4  MG  --   --  2.6*  --    CBC:  Recent Labs Lab 07/31/14 0340 08/01/14 0310 08/03/14 0404 08/05/14 0411  WBC 9.3 5.9 6.9 7.2  HGB 12.5 12.5 11.9* 12.4  HCT 37.5 37.7 37.3 38.0  MCV 86.6 87.7 89.2 89.4  PLT 188 167 194 164   BNP (last 3 results)  Recent Labs  07/19/14 2039 07/28/14 1225 07/29/14 0330  PROBNP 1467.0* 576.5* 753.4*   CBG:  Recent Labs Lab 08/05/14 1723 08/05/14 2026 08/06/14 0004 08/06/14 0418 08/06/14 0757  GLUCAP 136* 134* 121* 98 97    Recent Results (from the past 240 hour(s))  Blood culture (routine x 2)     Status: None   Collection Time: 07/28/14 12:21 PM  Result Value Ref Range Status   Specimen Description BLOOD LEFT WRIST  4 ML IN [facility] BOTTLE  Final   Special Requests NONE  Final   Culture  Setup Time   Final    07/28/2014 20:11 Performed at [facility]    Culture   Final    NO GROWTH 5 DAYS Performed at [facility]    Report Status 08/03/2014 FINAL  Final  Blood culture (routine x 2)     Status: None   Collection Time: 07/28/14 12:49 PM  Result Value Ref Range Status   Specimen Description BLOOD  BLOOD LEFT FOREARM  Final   Special Requests BOTTLES DRAWN AEROBIC AND ANAEROBIC 5ML  Final   Culture  Setup Time   Final    07/28/2014 20:10 Performed at [facility]    Culture   Final    NO GROWTH 5 DAYS Performed at [facility]    Report Status 08/03/2014 FINAL  Final  Urine culture     Status: None   Collection Time: 07/28/14  2:03 PM  Result Value Ref Range Status   Specimen Description URINE, CLEAN CATCH  Final   Special Requests NONE  Final   Culture  Setup Time   Final    07/29/2014 02:28 Performed at [facility]   Final    30,000 COLONIES/ML Performed at [facility]    Culture   Final    STAPHYLOCOCCUS SPECIES (COAGULASE NEGATIVE) Note: RIFAMPIN AND GENTAMICIN SHOULD NOT BE USED AS SINGLE DRUGS FOR TREATMENT  OF STAPH INFECTIONS. Performed at [facility]    Report Status 07/31/2014 FINAL  Final   Organism ID, Bacteria STAPHYLOCOCCUS SPECIES (COAGULASE NEGATIVE)  Final      Susceptibility   Staphylococcus species (coagulase negative) - MIC*    GENTAMICIN <=0.5 SENSITIVE Sensitive     LEVOFLOXACIN >=8 RESISTANT Resistant     NITROFURANTOIN <=16 SENSITIVE Sensitive     OXACILLIN >=4 RESISTANT Resistant     PENICILLIN >=0.5 RESISTANT Resistant     RIFAMPIN <=0.5 SENSITIVE Sensitive     TRIMETH/SULFA <=10 SENSITIVE Sensitive     VANCOMYCIN 1 SENSITIVE Sensitive     TETRACYCLINE <=1 SENSITIVE Sensitive     * STAPHYLOCOCCUS SPECIES (COAGULASE NEGATIVE)  MRSA PCR Screening     Status: None   Collection Time: 07/28/14  3:52 PM  Result Value Ref Range Status   MRSA by PCR NEGATIVE NEGATIVE Final    Comment:        The GeneXpert MRSA Assay (FDA approved for NASAL specimens only), is one component of a comprehensive MRSA colonization surveillance program. It is not intended to diagnose MRSA infection nor to guide or monitor treatment for MRSA infections.  Culture, expectorated sputum-assessment      Status: None   Collection Time: 07/28/14  4:21 PM  Result Value Ref Range Status   Specimen Description SPUTUM  Final   Special Requests Immunocompromised  Final   Sputum evaluation   Final    THIS SPECIMEN IS ACCEPTABLE. RESPIRATORY CULTURE REPORT TO FOLLOW.   Report Status 07/28/2014 FINAL  Final  Culture, respiratory (NON-Expectorated)     Status: None   Collection Time: 07/28/14  4:21 PM  Result Value Ref Range Status   Specimen Description SPUTUM  Final   Special Requests NONE  Final   Gram Stain   Final    NO WBC SEEN FEW SQUAMOUS EPITHELIAL CELLS PRESENT MODERATE GRAM POSITIVE RODS FEW GRAM NEGATIVE RODS FEW GRAM POSITIVE COCCI IN PAIRS    Culture   Final    NORMAL OROPHARYNGEAL FLORA Performed at [facility]    Report Status 07/31/2014 FINAL  Final  Ova and parasite examination     Status: None   Collection Time: 07/29/14  7:28 PM  Result Value Ref Range Status   Specimen Description STOOL  Final   Special Requests NONE  Final   Ova and parasites   Final    NO OVA OR PARASITES SEEN NO STRONGYLOIDES STERCORALIS SEEN Performed at [facility]    Report Status 07/31/2014 FINAL  Final     Studies: Ir Gastrostomy Tube Mod Sed  08/06/2014   CLINICAL DATA:  80 year old female with dysphagia and protein calorie malnutrition. She has a large hiatal hernia. Placement of a percutaneous gastrostomy tube is warranted for nutrition.  EXAM: PERC PLACEMENT GASTROSTOMY  Date: 08/06/2014  PROCEDURE: 1. Fluoroscopically guided placement of percutaneous pull-through gastrostomy tube. Interventional Radiologist:  Criselda Peaches, MD  ANESTHESIA/SEDATION: Moderate (conscious) sedation was used. 1.5 mg Versed, 50 mcg Fentanyl were administered intravenously. The patient's vital signs were monitored continuously by radiology nursing throughout the procedure.  Sedation Time: 26 minutes  FLUOROSCOPY TIME:  7 min 24 seconds (71 mGy)  CONTRAST:  10 mL Omnipaque 3  MEDICATIONS:  1 g vancomycin was administered intravenously within 1 hour of skin incision.  TECHNIQUE: Informed consent was obtained from the patient following explanation of the procedure, risks, benefits and alternatives. The patient understands, agrees and consents for the procedure. All questions  were addressed. A time out was performed.  Maximal barrier sterile technique utilized including caps, mask, sterile gowns, sterile gloves, large sterile drape, hand hygiene, and chlorhexadine skin prep.  An angled catheter was advanced over a wire under fluoroscopic guidance through the nose, down the esophagus and into the body of the stomach. The stomach was then insufflated with several 100 ml of air. Fluoroscopy confirmed location of the gastric bubble, as well as inferior displacement of the barium stained colon. Under direct fluoroscopic guidance, a single T-tack was placed, and the anterior gastric wall drawn up against the anterior abdominal wall. Percutaneous access was then obtained into the mid gastric body with an 18 gauge sheath needle. Aspiration of air, and injection of contrast material under fluoroscopy confirmed needle placement.  An Amplatz wire was advanced in the gastric body and the access needle exchanged for a 9-French vascular sheath. A snare device was advanced through the vascular sheath and an Amplatz wire advanced through the angled catheter. The Amplatz wire was successfully snared and this was pulled up through the esophagus and out the mouth. A 20-French Alinda Dooms MIC-PEG tube was then connected to the snare and pulled through the mouth, down the esophagus, into the stomach and out to the anterior abdominal wall. Hand injection of contrast material confirmed intragastric location. The T-tack retention suture was then cut. The pull through peg tube was then secured with the external bumper and capped.  The patient will be observed for several hours with the newly placed tube on low wall suction to  evaluate for any post procedure complication. The patient tolerated the procedure well, there is no immediate complication.  IMPRESSION: Successful placement of a 20 French pull through gastrostomy tube.  Signed,  Criselda Peaches, MD  [facility]  [facility]   Electronically Signed   By: Jacqulynn Cadet M.D.   On: 08/06/2014 16:58    Scheduled Meds: . antiseptic oral rinse  7 mL Mouth Rinse q12n4p  . aspirin  150 mg Rectal Daily  . chlorhexidine  15 mL Mouth Rinse BID  . enoxaparin (LOVENOX) injection  40 mg Subcutaneous Q24H  . folic acid  1 mg Intravenous Daily  . furosemide  20 mg Per Tube Daily  . lidocaine      . metoprolol tartrate  6.25 mg Per Tube BID  . pantoprazole sodium  40 mg Per Tube BID  . polyvinyl alcohol  1 drop Both Eyes Daily  . potassium chloride  20 mEq Per Tube Daily  . prednisoLONE  5.1 mg Per Tube QAC breakfast  . sodium chloride  3 mL Intravenous Q12H   Continuous Infusions: . dextrose 5 % and 0.45 % NaCl with KCl 10 mEq/L    . feeding supplement (VITAL AF 1.2 CAL) Stopped (08/05/14 0015)    Principal Problem:   Sepsis Active Problems:   Hyponatremia   Immune disorder   Fever   Diastolic HF (heart failure)   Chronic diastolic heart failure   Acute respiratory failure with hypoxemia   Dyspnea   Pyrexia   Hypoxemia   Pulmonary infiltrate   Malnutrition of moderate degree   Aspiration into airway   Failure to thrive in adult   Blood poisoning   Dysphagia    Time spent: 30 minutes.     Barton Dubois  [facility] Pager [phone]. If 7PM-7AM, please contact night-coverage at [email or web address], password [facility] 08/06/2014, 5:25 PM  LOS: 9 days

## 2014-08-06 NOTE — Progress Notes (Signed)
PT Cancellation Note  Patient Details Name: Martha Mcbride MRN: [id number] DOB: July 05, 1925   Cancelled Treatment:    Reason Eval/Treat Not Completed: Patient at procedure or test/unavailable will  Return at next opportunity when patient is available.  Claretha Cooper 08/06/2014, 12:34 PM

## 2014-08-06 NOTE — Procedures (Signed)
Interventional Radiology Procedure Note  Procedure: Placement of percutaneous 20F pull-through gastrostomy tube. Complications: None Recommendations: - NPO except for sips and chips remainder of today and overnight - Maintain G-tube to LWS until tomorrow morning  - May advance diet as tolerated and begin using tube tomorrow morning  Signed,  Grove Defina K. Bluford Sedler, MD   

## 2014-08-06 NOTE — Plan of Care (Signed)
Problem: Phase I Progression Outcomes Goal: Initial discharge plan identified Outcome: Completed/Met Date Met:  08/06/14 Goal: Other Phase I Outcomes/Goals Outcome: Not Applicable Date Met:  57/84/69

## 2014-08-06 NOTE — Progress Notes (Signed)
Pt wore continuous pulse ox overnight without oxygen. Oxygen saturation readings ranged between 86% and 97%.

## 2014-08-06 NOTE — Progress Notes (Signed)
NUTRITION FOLLOW UP  Intervention:    NPO  Begin TF with Vital AF 1.2 at 20 ml/hr and increase 10 ml as tolerated to initial goal of 50 ml/hr. (1440 kcal, 90 gm protein, 1071 ml free water).  Vital AF 1.2 volume goal is 1.2 L daily with an additional 500 ml free water daily after discharge.  Free water 125 ml 4 times daily after IVF d/c'ed.     Educated daughter on TF options and goals.  Eventual goal could be bolus but initial goal is to be assured of tolerance of current formula.  Alternative option of continuous feedings given at a higher rate over a longer period of time. (75 ml/hr x 16 hours per day for example)  Will speak with RD at [facility] [place] to follow after discharge.  Noted swallow to be re-evaluated in a couple of weeks.  Monitor TF tolerance  RD to follow closely and educate daughter and other family members daily.  Nutrition Dx:   Inadequate oral intake related to decreased appetite/taste changes/nausea as evidenced by PO intake < 75%.; ongoing, now related to NPO status  Goal:   TF to meet >/= 90% of their estimated nutrition needs, not yet met  Monitor:   TF tolerance, swallow profile, total protein/energy intake, labs, weights, skin integrity  Assessment:   80 y.o. female with PMH significant for Polymyalgia on prednisone, severely calcified mitral valve with moderate stenosis, chronic diastolic heart failure,Gangrenous toes on the right foot , last admission 10-27 was treated for health care associated PNA, encephalopathy, and diastolic HF exacerbation.   Met with daughters this afternoon.  Daughter was very upset about inconsistency of information and communication and that my visit had not been earlier, as well as events that led mother to receive G-tube.  Patient is s/p G-tube today in IR.  Daughter concerned that patient may be discharged before she is comfortable with care.  Daughter had many questions regarding TF formula, rates, options, calculation of  calorie and protein needs and patient's weight loss.  Questions answered.   Met with daughter again this evening and educated her on details of TF treatment.  Provided book on Home TF from [place].  Daughter was more relaxed and able to learn at this time.  Other concern from daughter is incontinent bowel with TF.  Will monitor tolerance.  Sacral wound noted.  Discussed patient with Dr. Dyann Kief and will begin feedings in the am.  Discussed plans and patient with RN.    Considerations with TF to reduce aspiration risk with goals.  Avoiding feedings prior to bed and maintaining elevation of the head of the bed between 30 and 45 degrees.   Height: Ht Readings from Last 1 Encounters:  07/28/14 $RemoveB'5\' 1"'[id number]$  (1.549 m)    Weight Status:   Wt Readings from Last 1 Encounters:  08/06/14 127 lb 10.3 oz (57.9 kg)  07/30/14 146 lb  Re-estimated needs (from new weight):  Kcal: 1500-1700 Protein: 80-95 gram Fluid: >/= 1600 ml daily  Skin: stage 2 PU on buttock, gangreneous toes on right foot  Diet Order: Diet NPO time specified Except for: Ice Chips   Intake/Output Summary (Last 24 hours) at 08/06/14 1917 Last data filed at 08/06/14 1300  Gross per 24 hour  Intake      0 ml  Output    200 ml  Net   -200 ml    Last BM: 11/6   Labs:   Recent Labs Lab 08/01/14 0310 08/03/14  0404 08/05/14 0411  NA 136* 142 137  K 4.2 3.8 3.3*  CL 96 102 101  CO2 $Re'25 25 22  'LiT$ BUN 15 24* 18  CREATININE 0.62 0.63 0.50  CALCIUM 8.5 8.5 8.4  MG  --  2.6*  --   GLUCOSE 178* 103* 109*    CBG (last 3)   Recent Labs  08/06/14 0418 08/06/14 0757 08/06/14 1730  GLUCAP 98 97 114*    Scheduled Meds: . antiseptic oral rinse  7 mL Mouth Rinse q12n4p  . aspirin  150 mg Rectal Daily  . chlorhexidine  15 mL Mouth Rinse BID  . enoxaparin (LOVENOX) injection  40 mg Subcutaneous Q24H  . folic acid  1 mg Intravenous Daily  . [START ON 08/07/2014] free water  120 mL Per Tube QID  . furosemide  20 mg  Per Tube Daily  . lidocaine      . metoprolol tartrate  6.25 mg Per Tube BID  . pantoprazole sodium  40 mg Per Tube BID  . polyvinyl alcohol  1 drop Both Eyes Daily  . potassium chloride  20 mEq Per Tube Daily  . prednisoLONE  5.1 mg Per Tube QAC breakfast  . sodium chloride  3 mL Intravenous Q12H    Continuous Infusions: . dextrose 5 % and 0.45 % NaCl with KCl 10 mEq/L 30 mL/hr at 08/06/14 1804  . feeding supplement (VITAL AF 1.2 CAL)      Antonieta Iba, RD, LDN Clinical Inpatient Dietitian Pager:  [phone] Weekend and after hours pager:  [phone]

## 2014-08-07 LAB — GLUCOSE, CAPILLARY
GLUCOSE-CAPILLARY: 105 mg/dL — AB (ref 70–99)
Glucose-Capillary: 108 mg/dL — ABNORMAL HIGH (ref 70–99)
Glucose-Capillary: 186 mg/dL — ABNORMAL HIGH (ref 70–99)

## 2014-08-07 MED ORDER — MORPHINE SULFATE 2 MG/ML IJ SOLN
0.5000 mg | INTRAMUSCULAR | Status: DC | PRN
Start: 2014-08-07 — End: 2014-08-07
  Administered 2014-08-07: 0.5 mg via INTRAVENOUS
  Filled 2014-08-07: qty 1

## 2014-08-07 MED ORDER — OSMOLITE 1.5 CAL PO LIQD
120.0000 mL | ORAL | Status: DC
  Administered 2014-08-09: 120 mL
  Filled 2014-08-07 (×2): qty 237

## 2014-08-07 MED ORDER — PRO-STAT SUGAR FREE PO LIQD
30.0000 mL | Freq: Two times a day (BID) | ORAL | Status: DC
  Administered 2014-08-07 – 2014-08-09 (×4): 30 mL via ORAL
  Filled 2014-08-07 (×6): qty 30

## 2014-08-07 MED ORDER — OSMOLITE 1.5 CAL PO LIQD
1000.0000 mL | ORAL | Status: DC
  Administered 2014-08-07: 1000 mL
  Filled 2014-08-07 (×2): qty 1000

## 2014-08-07 MED ORDER — MUPIROCIN 2 % EX OINT
TOPICAL_OINTMENT | CUTANEOUS | Status: AC
  Administered 2014-08-07: 12:00:00
  Filled 2014-08-07: qty 22

## 2014-08-07 MED ORDER — MORPHINE SULFATE 2 MG/ML IJ SOLN
1.0000 mg | INTRAMUSCULAR | Status: AC | PRN
  Administered 2014-08-07: 0.5 mg via INTRAVENOUS
  Administered 2014-08-08: 1 mg via INTRAVENOUS
  Filled 2014-08-07 (×2): qty 1

## 2014-08-07 MED ORDER — ASPIRIN 81 MG PO CHEW
81.0000 mg | CHEWABLE_TABLET | Freq: Every day | ORAL | Status: DC
  Administered 2014-08-07 – 2014-08-10 (×4): 81 mg via ORAL
  Filled 2014-08-07 (×4): qty 1

## 2014-08-07 NOTE — Progress Notes (Signed)
Physical Therapy Treatment Patient Details Name: Martha Mcbride MRN: [id number] DOB: 08-19-1925 Today's Date: 08/07/2014    History of Present Illness 80 yo female admitted with sepsis, hypoxia. Hx of weakness, polymyalgia rheumatica, breast cancer, R foot ischemic toes. Recent d/c from [facility] on 10/26. Pt is from home with family.     PT Comments    Pt OOB in recliner with several family members in room including daughter.  Assisted pt out of recliner to amb to BR to void then amb short distance in hallway.  Limited activity tolerance and fatigues quickly.  Follow Up Recommendations   HH     Equipment Recommendations    YOUTH RW   Recommendations for Other Services       Precautions / Restrictions Precautions Precautions: Fall Precaution Comments: NPO Restrictions Weight Bearing Restrictions: No    Mobility  Bed Mobility               General bed mobility comments: Pt OOB in recliner  Transfers Overall transfer level: Needs assistance Equipment used: Rolling walker (2 wheeled) Transfers: Sit to/from Stand Sit to Stand: Min assist         General transfer comment: 75% VC's on proper tech and hand placement plus turn completion prior to sit.  Ambulation/Gait Ambulation/Gait assistance: Min assist Ambulation Distance (Feet): 24 Feet Assistive device: Rolling walker (2 wheeled) Gait Pattern/deviations: Step-to pattern;Step-through pattern;Trunk flexed Gait velocity: decreased   General Gait Details: very limited activity tolerance/fatigues quickly.  Assisted from recliner to BR then a short distance in hallway.  Recliner brought to pt due to fatigue.    Stairs            Wheelchair Mobility    Modified Rankin (Stroke Patients Only)       Balance                                    Cognition                            Exercises      General Comments        Pertinent Vitals/Pain Pain Assessment: No/denies pain     Home Living                      Prior Function            PT Goals (current goals can now be found in the care plan section)      Frequency       PT Plan      Co-evaluation             End of Session           Time: 2458-0998 PT Time Calculation (min) (ACUTE ONLY): 18 min  Charges:  $Gait Training: 8-22 mins                    G Codes:      Rica Koyanagi  PTA [facility]  Acute  Rehab Pager      [phone]

## 2014-08-07 NOTE — Care Management Note (Signed)
CARE MANAGEMENT NOTE 08/07/2014  Patient:  Martha Mcbride, Martha Mcbride   Account Number:  [id number]  Date Initiated:  08/01/2014  Documentation initiated by:  DAVIS,RHONDA  Subjective/Objective Assessment:   patient resp distress and failure/endo does show swallowing defects and high risk of aspiration/requiring 02 via face mask at 40%     Action/Plan:   tbd/patient lives alone does have support her adult children and friends/not clear as to how close the children live to the patient   Anticipated DC Date:  08/09/2014   Anticipated DC Plan:  [place]  In-house referral  NA      [place]  CM consult      PAC Choice  NA   Choice offered to / List presented to:  NA   DME arranged  [facility]  TUBE FEEDING PUMP  SUCTION      DME agency  [place] arranged  HH-1 RN  [place] agency  [place].   Status of service:  In process, will continue to follow Medicare Important Message given?  YES (If response is "NO", the following Medicare IM given date fields will be blank) Date Medicare IM given:  08/04/2014 Medicare IM given by:  [facility] Date Additional Medicare IM given:   Additional Medicare IM given by:    Discharge Disposition:    Per UR Regulation:  Reviewed for med. necessity/level of care/duration of stay  If discussed at [place] of Stay Meetings, dates discussed:    Comments:  08/07/14 Marney Doctor RN,BSN,NCM [phone] Spoke with pt and both daughters at bedside on 08/06/14. They had many questions about home health and DC planning. Pt with new peg tube and will receive tube feedings at home.  Trial of continuous tube feeding to get pt to goal today.  If goes well per dietitian pt will then be tried on bolus feedings tomorrow am and should be ready to DC tomorrow afternoon if tolerates bolus feedings well.  Both daughters encouraged to learn to do  TF and staff RN Josph Macho is doing teaching with them today.  Per [facility] DME rep staff RN will need to do continuous 02 check tonight to determine need for 02 at night at home.  Check must be done for at least 2 hours and pt must sat below 88% for 5 consecutive minutes on room air.  Start and stop time must be documented and time pt dropped below 88% documented as well.  Instruction given to [employer].  Final orders for DME and TF will be needed prior to DC (if TF pump and 02 needed).  CM will continue to follow and assist with DC needs.  08/04/2014 1650 NCM spoke to [facility] DME rep and he will follow up with dtr in am about DME delivery. Pt may need hospital bed at home. Jonnie Finner RN CCM Case Mgmt phone [phone]  08/04/2014 1000  NCM spoke to make aware of dc home with DME. States DME will be delivered at dc to home. When they bring concentrator they will deliver suction. Notified [place] HH rep that pt will not dc home today. Jonnie Finner RN CCM Case Mgmt phone [phone]   [id number] Rosana Hoes, RN, BSN, CCM Chart reviewed. Discharge needs and patient's stay to be reviewed and followed by case manager.[place]

## 2014-08-07 NOTE — Progress Notes (Addendum)
NUTRITION FOLLOW UP  Intervention:    NPO except for ice chips  Will Change formula to Osmolite 1.5 at 20 ml/hr and increase 10 ml/hr to gal or 40 ml/hr per G-tube.  Will add prostat 30 ml bid per G-tube.  Will transition to bolus feedings with 1 can Osmolite 1.5 4 times daily after continuous feeding tolerance established.  Home Goal:  Bolus feedings with Osmolite 1.5 1 can 4 times daily (8 am, noon, 4 pm, 8 pm),  Plus an additional 20-30 gm protein daily per tube.  Flush tube with 60 ml water before and after each feeding.  Patient needs an additional 240 ml of water daily which patient can receive with ice chips po or free water per tube.  Calorie goals 1400-1600 kcal, 80-95 gm protein daily. Recommended that head of the bed be raised to prevent aspiration even during sleep secondary to hx acid reflux.  Will speak with RD at [facility] to follow after discharge.  Message left with their office.  Noted swallow to be re-evaluated in a couple of weeks.  Met with daughter [name] and answered question and educated.  Monitor TF tolerance  RD to follow closely and educate daughter and other family members daily.  Nutrition Dx:   Inadequate oral intake related to decreased appetite/taste changes/nausea as evidenced by PO intake < 75%.; ongoing, now related to NPO status  Goal:   TF to meet >/= 90% of their estimated nutrition needs, not yet met  Monitor:   TF tolerance, swallow profile, total protein/energy intake, labs, weights, skin integrity  Assessment:   80 y.o. female with PMH significant for Polymyalgia on prednisone, severely calcified mitral valve with moderate stenosis, chronic diastolic heart failure,Gangrenous toes on the right foot , last admission 10-27 was treated for health care associated PNA, encephalopathy, and diastolic HF exacerbation.   Will continue close follow up of patient and education of family.  Tolerating TF well.  Eating a lot of ice chips.  Pain  this am due to tube and meds given for treatment.  Patient talking and states that she feels much better.  Discussed with nurse.  Family has our office number. Height: Ht Readings from Last 1 Encounters:  07/28/14 $RemoveB'5\' 1"'vSLMTrbI$  (1.549 m)    Weight Status:   Wt Readings from Last 1 Encounters:  08/07/14 128 lb 4.9 oz (58.2 kg)  07/30/14 146 lb  Re-estimated needs (from new weight):  Kcal: 1500-1700 Protein: 80-95 gram Fluid: >/= 1600 ml daily  Skin: stage 2 PU on buttock, gangreneous toes on right foot  Diet Order: Diet NPO time specified Except for: Ice Chips   Intake/Output Summary (Last 24 hours) at 08/07/14 1125 Last data filed at 08/07/14 0547  Gross per 24 hour  Intake  351.5 ml  Output    700 ml  Net -348.5 ml    Last BM: 11/6   Labs:   Recent Labs Lab 08/01/14 0310 08/03/14 0404 08/05/14 0411  NA 136* 142 137  K 4.2 3.8 3.3*  CL 96 102 101  CO2 $Re'25 25 22  'YvL$ BUN 15 24* 18  CREATININE 0.62 0.63 0.50  CALCIUM 8.5 8.5 8.4  MG  --  2.6*  --   GLUCOSE 178* 103* 109*    CBG (last 3)   Recent Labs  08/06/14 2047 08/07/14 0121 08/07/14 0751  GLUCAP 133* 105* 108*    Scheduled Meds: . antiseptic oral rinse  7 mL Mouth Rinse q12n4p  . aspirin  81  mg Oral Daily  . chlorhexidine  15 mL Mouth Rinse BID  . enoxaparin (LOVENOX) injection  40 mg Subcutaneous Q24H  . folic acid  1 mg Intravenous Daily  . free water  120 mL Per Tube QID  . furosemide  20 mg Per Tube Daily  . metoprolol tartrate  6.25 mg Per Tube BID  . pantoprazole sodium  40 mg Per Tube BID  . polyvinyl alcohol  1 drop Both Eyes Daily  . potassium chloride  20 mEq Per Tube Daily  . prednisoLONE  5.1 mg Per Tube QAC breakfast  . sodium chloride  3 mL Intravenous Q12H    Continuous Infusions: . dextrose 5 % and 0.45 % NaCl with KCl 10 mEq/L 30 mL/hr at 08/06/14 1804  . feeding supplement (VITAL AF 1.2 CAL)      Antonieta Iba, RD, LDN Clinical Inpatient Dietitian Pager:  [phone] Weekend and  after hours pager:  [phone]

## 2014-08-07 NOTE — Progress Notes (Signed)
[facility] PROGRESS NOTE  Martha Mcbride LYY:[id number] DOB: Feb 21, 1925 DOA: 07/28/2014 PCP: Simona Huh, MD  Interim discharge summary 80 y.o. female with PMH significant for Polymyalgia on prednisone, severely calcified mitral valve with moderate stenosis, chronic diastolic heart failure,Gangrenous toes on the right foot , last admission 10-27 was treated for health care associated PNA, encephalopathy, and diastolic HF exacerbation.  She was brought to the emergency department on 10-31 for worsening shortness of breath, fever and hypoxia as noted by a home pulse oximeter that the daughter has. Per daughter patient has been having problem of low oxygen level at night during sleep. She was trying to arrange home oxygen trough patient 's PCP but was unsuccessful.CXR demonstrated worsening infiltrates, elevated WBC's and she febrile with temp of 102.  Work up demonstrated aspiration PNA (which is now treated) and patient found with severe oropharyngeal dysphagia. Now s/p PEG placement and initiation of TF's; will be able to go home in 1-2 days once TF's tolerated. (initiated on 11/10)   -Sepsis: Patient present with fever, BP in the 100 to 90 range, Lactic acid at 3.5. Chest x ray with persistent infiltrates. And WBC 12K range on admission. 2/2 aspiration PNA.  -Vancomycin and cefepime initially given, then discontinued. ID (recommended treatment with clindamycin for aspiration; treatment completed while inpatient).  -Patient has completed clindamycin therapy inpatient (11/06) -Blood culture negative.  -sepsis has now resolved and no further signs of active infection appreciated  -Acute Hypoxic Respiratory Failure. IPF,  Aspiration PNA.  -Chest x ray 11-01 showed worsening infiltrates: positive Pneumonitis.  -CCM (Dr. Elsworth Soho) consulted for further evaluation/treatment and recommendations were: no bronchoscopy, finish tx for aspiration PNA; resume home dose of steroids and to follow with  him on 11/17 as an outpatient. -Patient has Completed tx for PNA (aspiration) on 11/06; patient has remained afebrile, normal WBC's  -Patient will be discharge with home oxygen, specially at night; arranged through CM  -Sputum culture showed normal oro-pharyngeal flora (suggesting aspiration as well).   -Dysphagia, oropharyngeal, component of cervical esophageal;  -Family Agree with PEG tube feeding; as panda tube was clogging and unable to provide desirable rate for nutrition. -Will need to have swallowing reassess in 2-3 weeks again (GI office will arrange outpatient evaluation with speech therapy).  -GI will follow as an outpatient (Dr. Watt Climes vs Dr. Amedeo Plenty) -Will d/c home in 1-2 days with TF's and SPT; follow up with GI as an outpatient. 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 ID and pulmonary service consulted for treatment, recommendation and further evaluation. Recommendations were not to treat with further antihelmintic therapy and not to performed bronchoscopy; suspicious is very low and symptoms/findings consistent with aspiration PNA/pneumonitis  -Of note, patient had negative stool cultures for parasites last admission.  -Ova and parasite stool cx repeated on this admission and were negative as well.  -strongyloids antibody negative.  -Quantiferon Tb negative  -Chronic Diastolic HF; Appears compensated.  -BNP at 576, last admission at 1467. Patient appears compensated.  -Chest x ray with worsening infiltrates and pneumonitis.  -will resume lasix, metoprolol, and potassium oral solution through PEG tube  -Polymyalgia Rheumatica:  -On prednisone 5 mg daily at home prior to admission. -Change to oral solution methylprednisolone oral solution (same dose as she was receiving at home,  5mg ) -leflunomide was discontinue during last admission and  would keep it like until follow up with rheumatology service.   -Gangrenous toes on the right foot  -Unclear etiology at this time, pulses on the right foot palpable with good perfusion  -Follow up with vascular outpatient.  -stable and without signs of superimposed infection.  -wound care following. Continue local care.   -Decubitus ulcer  -Wound care consulted; will follow instructions. -Appreciate assistance and recommendation.   -protein calorie Malnutrition, severe -s/p PEG on 11/9 by IR -follow instructions from nutrition service for TF  -continue Ice chips exercises/training for dysphagia  Appreciate consultants help and assistance; will follow rec's.   Code Status: Full Code.  Family Communication: Care discussed with Daughter at bedside Disposition Plan: transition meds to PO; hopefully home in 1-2 days   Consultants:  Pulmonary  ID.   IR (for PEG placement)  Procedures:  none  Antibiotics:  Vancomycin 10-31---11-02  Cefepime 10-31----11-03  Clindamycin 11-03>>11/06  HPI/Subjective: No distress. S/p placement of PEG tube (on 11/09). No fever, no vomiting. Having some pain in the incision site (relieved by morphine). Patient has now been started back on TF and RD is actively following her.  Objective: Filed Vitals:   08/07/14 0515  BP: 140/60  Pulse: 90  Temp: 98.9 F (37.2 C)  Resp: 20    Intake/Output Summary (Last 24 hours) at 08/07/14 1330 Last data filed at 08/07/14 0547  Gross per 24 hour  Intake  351.5 ml  Output    500 ml  Net -148.5 ml   Filed Weights   08/06/14 0518 08/07/14 0515 08/07/14 0546  Weight: 57.9 kg (127 lb 10.3 oz) 58.2 kg (128 lb 4.9 oz) 58.2 kg (128 lb 4.9 oz)    Exam:   General:  No distress. S/p placement of PEG tube (on 11/09). No fever, no vomiting. Having some pain in the incision site (relieved by morphine). Patient has now been started back  on TF and RD is actively following her.  Cardiovascular: S 1, S 2 RRR; positive SEM  Respiratory:CTA bilaterally; no wheezing, no rales  Abdomen: BS present, soft. No tenderness   Musculoskeletal: no edema.   Data Reviewed: Basic Metabolic Panel:  Recent Labs Lab 08/01/14 0310 08/03/14 0404 08/05/14 0411  NA 136* 142 137  K 4.2 3.8 3.3*  CL 96 102 101  CO2 25 25 22   GLUCOSE 178* 103* 109*  BUN 15 24* 18  CREATININE 0.62 0.63 0.50  CALCIUM 8.5 8.5 8.4  MG  --  2.6*  --    CBC:  Recent Labs Lab 08/01/14 0310 08/03/14 0404 08/05/14 0411  WBC 5.9 6.9 7.2  HGB 12.5 11.9* 12.4  HCT 37.7 37.3 38.0  MCV 87.7 89.2 89.4  PLT 167 194 164   BNP (last 3 results)  Recent Labs  07/19/14 2039 07/28/14 1225 07/29/14 0330  PROBNP 1467.0* 576.5* 753.4*   CBG:  Recent Labs Lab 08/06/14 1730 08/06/14 2047 08/07/14 0121 08/07/14 0751 08/07/14 1208  GLUCAP 114* 133* 105* 108* 186*    Recent Results (from the past 240 hour(s))  Urine culture     Status: None   Collection Time: 07/28/14  2:03 PM  Result Value Ref Range Status   Specimen Description URINE, CLEAN CATCH  Final   Special Requests NONE  Final   Culture  Setup Time   Final    07/29/2014 02:28 Performed at [facility]   Final    30,000 COLONIES/ML Performed at [facility]  Culture   Final    STAPHYLOCOCCUS SPECIES (COAGULASE NEGATIVE) Note: RIFAMPIN AND GENTAMICIN SHOULD NOT BE USED AS SINGLE DRUGS FOR TREATMENT OF STAPH INFECTIONS. Performed at [facility]    Report Status 07/31/2014 FINAL  Final   Organism ID, Bacteria STAPHYLOCOCCUS SPECIES (COAGULASE NEGATIVE)  Final      Susceptibility   Staphylococcus species (coagulase negative) - MIC*    GENTAMICIN <=0.5 SENSITIVE Sensitive     LEVOFLOXACIN >=8 RESISTANT Resistant     NITROFURANTOIN <=16 SENSITIVE Sensitive     OXACILLIN >=4 RESISTANT Resistant     PENICILLIN >=0.5 RESISTANT Resistant      RIFAMPIN <=0.5 SENSITIVE Sensitive     TRIMETH/SULFA <=10 SENSITIVE Sensitive     VANCOMYCIN 1 SENSITIVE Sensitive     TETRACYCLINE <=1 SENSITIVE Sensitive     * STAPHYLOCOCCUS SPECIES (COAGULASE NEGATIVE)  MRSA PCR Screening     Status: None   Collection Time: 07/28/14  3:52 PM  Result Value Ref Range Status   MRSA by PCR NEGATIVE NEGATIVE Final    Comment:        The GeneXpert MRSA Assay (FDA approved for NASAL specimens only), is one component of a comprehensive MRSA colonization surveillance program. It is not intended to diagnose MRSA infection nor to guide or monitor treatment for MRSA infections.  Culture, expectorated sputum-assessment     Status: None   Collection Time: 07/28/14  4:21 PM  Result Value Ref Range Status   Specimen Description SPUTUM  Final   Special Requests Immunocompromised  Final   Sputum evaluation   Final    THIS SPECIMEN IS ACCEPTABLE. RESPIRATORY CULTURE REPORT TO FOLLOW.   Report Status 07/28/2014 FINAL  Final  Culture, respiratory (NON-Expectorated)     Status: None   Collection Time: 07/28/14  4:21 PM  Result Value Ref Range Status   Specimen Description SPUTUM  Final   Special Requests NONE  Final   Gram Stain   Final    NO WBC SEEN FEW SQUAMOUS EPITHELIAL CELLS PRESENT MODERATE GRAM POSITIVE RODS FEW GRAM NEGATIVE RODS FEW GRAM POSITIVE COCCI IN PAIRS    Culture   Final    NORMAL OROPHARYNGEAL FLORA Performed at [facility]    Report Status 07/31/2014 FINAL  Final  Ova and parasite examination     Status: None   Collection Time: 07/29/14  7:28 PM  Result Value Ref Range Status   Specimen Description STOOL  Final   Special Requests NONE  Final   Ova and parasites   Final    NO OVA OR PARASITES SEEN NO STRONGYLOIDES STERCORALIS SEEN Performed at [facility]    Report Status 07/31/2014 FINAL  Final     Studies: Ir Gastrostomy Tube Mod Sed  08/06/2014   CLINICAL DATA:  80 year old female with dysphagia and  protein calorie malnutrition. She has a large hiatal hernia. Placement of a percutaneous gastrostomy tube is warranted for nutrition.  EXAM: PERC PLACEMENT GASTROSTOMY  Date: 08/06/2014  PROCEDURE: 1. Fluoroscopically guided placement of percutaneous pull-through gastrostomy tube. Interventional Radiologist:  Criselda Peaches, MD  ANESTHESIA/SEDATION: Moderate (conscious) sedation was used. 1.5 mg Versed, 50 mcg Fentanyl were administered intravenously. The patient's vital signs were monitored continuously by radiology nursing throughout the procedure.  Sedation Time: 26 minutes  FLUOROSCOPY TIME:  7 min 24 seconds (71 mGy)  CONTRAST:  10 mL Omnipaque 3  MEDICATIONS: 1 g vancomycin was administered intravenously within 1 hour of skin incision.  TECHNIQUE: Informed consent was  obtained from the patient following explanation of the procedure, risks, benefits and alternatives. The patient understands, agrees and consents for the procedure. All questions were addressed. A time out was performed.  Maximal barrier sterile technique utilized including caps, mask, sterile gowns, sterile gloves, large sterile drape, hand hygiene, and chlorhexadine skin prep.  An angled catheter was advanced over a wire under fluoroscopic guidance through the nose, down the esophagus and into the body of the stomach. The stomach was then insufflated with several 100 ml of air. Fluoroscopy confirmed location of the gastric bubble, as well as inferior displacement of the barium stained colon. Under direct fluoroscopic guidance, a single T-tack was placed, and the anterior gastric wall drawn up against the anterior abdominal wall. Percutaneous access was then obtained into the mid gastric body with an 18 gauge sheath needle. Aspiration of air, and injection of contrast material under fluoroscopy confirmed needle placement.  An Amplatz wire was advanced in the gastric body and the access needle exchanged for a 9-French vascular sheath. A snare  device was advanced through the vascular sheath and an Amplatz wire advanced through the angled catheter. The Amplatz wire was successfully snared and this was pulled up through the esophagus and out the mouth. A 20-French Alinda Dooms MIC-PEG tube was then connected to the snare and pulled through the mouth, down the esophagus, into the stomach and out to the anterior abdominal wall. Hand injection of contrast material confirmed intragastric location. The T-tack retention suture was then cut. The pull through peg tube was then secured with the external bumper and capped.  The patient will be observed for several hours with the newly placed tube on low wall suction to evaluate for any post procedure complication. The patient tolerated the procedure well, there is no immediate complication.  IMPRESSION: Successful placement of a 20 French pull through gastrostomy tube.  Signed,  Criselda Peaches, MD  [facility]  [facility]   Electronically Signed   By: Jacqulynn Cadet M.D.   On: 08/06/2014 16:58    Scheduled Meds: . antiseptic oral rinse  7 mL Mouth Rinse q12n4p  . aspirin  81 mg Oral Daily  . chlorhexidine  15 mL Mouth Rinse BID  . enoxaparin (LOVENOX) injection  40 mg Subcutaneous Q24H  . [START ON 08/08/2014] feeding supplement (OSMOLITE 1.5 CAL)  120-237 mL Per Tube Q24H  . feeding supplement (PRO-STAT SUGAR FREE 64)  30 mL Oral BID  . folic acid  1 mg Intravenous Daily  . free water  120 mL Per Tube QID  . furosemide  20 mg Per Tube Daily  . metoprolol tartrate  6.25 mg Per Tube BID  . mupirocin ointment      . pantoprazole sodium  40 mg Per Tube BID  . polyvinyl alcohol  1 drop Both Eyes Daily  . potassium chloride  20 mEq Per Tube Daily  . prednisoLONE  5.1 mg Per Tube QAC breakfast  . sodium chloride  3 mL Intravenous Q12H   Continuous Infusions: . dextrose 5 % and 0.45 % NaCl with KCl 10 mEq/L 30 mL/hr at 08/06/14 1804  . feeding  supplement (OSMOLITE 1.5 CAL) 1,000 mL (08/07/14 1310)    Principal Problem:   Sepsis Active Problems:   Hyponatremia   Immune disorder   Fever   Diastolic HF (heart failure)   Chronic diastolic heart failure   Acute respiratory failure with hypoxemia   Dyspnea   Pyrexia   Hypoxemia  Pulmonary infiltrate   Malnutrition of moderate degree   Aspiration into airway   Failure to thrive in adult   Blood poisoning   Dysphagia    Time spent: 30 minutes.     Barton Dubois  [facility] Pager [phone]. If 7PM-7AM, please contact night-coverage at [email or web address], password [facility] 08/07/2014, 1:30 PM  LOS: 10 days

## 2014-08-07 NOTE — Progress Notes (Signed)
Nutrition Follow up  Spoke with patient's daughter Martha Mcbride again this afternoon.  Questions answered.  Daughter [name] concerned about TF formula.  Patient is tolerating Osmolite 1.5 well and benefits are decreased volume for bolus feeds.  TF currently at 30 ml per hour and to increase to goal of 40 ml/hr this afternoon.  Discussed patient with nurse following up several times today.  BM today pudding consistency.  Will begin bolus feeds in am.  Discussed discharge plans with daughter.  Daughter states that patient is weak and may refuse discharge if tomorrow.  Discussed with case Freight forwarder.  Recommendations in earlier note today. Spoke [facility].  Discussed patient needs and status.  Will meet with family again in am for further education and to evaluate patient and tolerance of bolus feeding.  Antonieta Iba, RD, LDN Clinical Inpatient Dietitian Pager:  [phone] Weekend and after hours pager:  [phone]

## 2014-08-07 NOTE — Progress Notes (Signed)
This RN informed pt's daughter that pt was to have O2 test repeated tonight to determine if pt qualifies for home O2 per Case Manager's note from today. Pt's daughter stated that she was "upset that her sister was not informed of the repeat test since it was done a few days ago". Pt's daughter was told by Dr. Dyann Kief that pt qualifies for home oxygen and that it was ordered. Pt's daughter states that pt needs oxygen while lying flat at night. Pt is currently receiving tube feeds via PEG tube which requires pt's head to be elevated to prevent aspiration. Pt's daughter requests that pt's O2 test be completed tomorrow night after pt is converted to bolus feeds and can safely lie flat. Pt's daughter states that this would give a more accurate result and allow pt to qualify for home O2. Noreene Larsson RN, BSN

## 2014-08-07 NOTE — Plan of Care (Signed)
Problem: Phase II Progression Outcomes Goal: Progress activity as tolerated unless otherwise ordered Outcome: Completed/Met Date Met:  08/07/14 Goal: Discharge plan established Outcome: Completed/Met Date Met:  08/07/14 Goal: Vital signs remain stable Outcome: Completed/Met Date Met:  08/07/14 Goal: Obtain order to discontinue catheter if appropriate Outcome: Not Applicable Date Met:  14/38/88 Goal: Other Phase II Outcomes/Goals Outcome: Progressing

## 2014-08-07 NOTE — Plan of Care (Signed)
Problem: Consults Goal: Nutrition Consult-if indicated Outcome: Completed/Met Date Met:  08/07/14     

## 2014-08-08 LAB — CBC
HCT: 35.9 % — ABNORMAL LOW (ref 36.0–46.0)
Hemoglobin: 11.8 g/dL — ABNORMAL LOW (ref 12.0–15.0)
MCH: 29.4 pg (ref 26.0–34.0)
MCHC: 32.9 g/dL (ref 30.0–36.0)
MCV: 89.5 fL (ref 78.0–100.0)
Platelets: 136 10*3/uL — ABNORMAL LOW (ref 150–400)
RBC: 4.01 MIL/uL (ref 3.87–5.11)
RDW: 18.9 % — ABNORMAL HIGH (ref 11.5–15.5)
WBC: 7.3 10*3/uL (ref 4.0–10.5)

## 2014-08-08 LAB — GLUCOSE, CAPILLARY
GLUCOSE-CAPILLARY: 195 mg/dL — AB (ref 70–99)
Glucose-Capillary: 135 mg/dL — ABNORMAL HIGH (ref 70–99)
Glucose-Capillary: 162 mg/dL — ABNORMAL HIGH (ref 70–99)
Glucose-Capillary: 182 mg/dL — ABNORMAL HIGH (ref 70–99)
Glucose-Capillary: 95 mg/dL (ref 70–99)

## 2014-08-08 LAB — BASIC METABOLIC PANEL
ANION GAP: 11 (ref 5–15)
BUN: 14 mg/dL (ref 6–23)
CALCIUM: 8.2 mg/dL — AB (ref 8.4–10.5)
CO2: 25 mEq/L (ref 19–32)
Chloride: 102 mEq/L (ref 96–112)
Creatinine, Ser: 0.49 mg/dL — ABNORMAL LOW (ref 0.50–1.10)
GFR calc non Af Amer: 85 mL/min — ABNORMAL LOW (ref >90)
Glucose, Bld: 178 mg/dL — ABNORMAL HIGH (ref 70–99)
Potassium: 3.2 mEq/L — ABNORMAL LOW (ref 3.7–5.3)
SODIUM: 138 meq/L (ref 137–147)

## 2014-08-08 MED ORDER — OSMOLITE 1.5 CAL PO LIQD: 1000.0000 mL | ORAL | Status: DC

## 2014-08-08 NOTE — Progress Notes (Signed)
CBGs last pm: 11/10 8pm: 162, 11/11 12am: 206, 11/11 4am: 172. Noreene Larsson RN, BSN

## 2014-08-08 NOTE — Progress Notes (Signed)
Pt's midnight CBG 206. NP Baltazar Najjar on call notified. Awaiting orders. Noreene Larsson RN, BSN

## 2014-08-08 NOTE — Care Management Note (Signed)
CARE MANAGEMENT NOTE 08/08/2014  Patient:  Martha Mcbride, Martha Mcbride   Account Number:  [id number]  Date Initiated:  08/01/2014  Documentation initiated by:  DAVIS,RHONDA  Subjective/Objective Assessment:   patient resp distress and failure/endo does show swallowing defects and high risk of aspiration/requiring 02 via face mask at 40%     Action/Plan:   tbd/patient lives alone does have support her adult children and friends/not clear as to how close the children live to the patient   Anticipated DC Date:  08/09/2014   Anticipated DC Plan:  [place]  In-house referral  NA      [place]  CM consult      PAC Choice  NA   Choice offered to / List presented to:  NA   DME arranged  [facility]  TUBE FEEDING PUMP  [place]      DME agency  [place] arranged  HH-1 RN  [place] agency  [place].   Status of service:  In process, will continue to follow Medicare Important Message given?  YES (If response is "NO", the following Medicare IM given date fields will be blank) Date Medicare IM given:  08/04/2014 Medicare IM given by:  [facility] Date Additional Medicare IM given:  08/08/2014 Additional Medicare IM given by:  [facility] Martha Mcbride  Discharge Disposition:    Per UR Regulation:  Reviewed for med. necessity/level of care/duration of stay  If discussed at [place] of Stay Meetings, dates discussed:    Comments:  08/08/14 Marney Doctor RN,BSN,NCM [phone] Continuous 02 check was not done last night due to daughter refusing to allow TF to be turned off for test.  Pt has now transitioned to bolus tube feeds so Continuous 02 check will be done by RN tonight.  Staff RN Lynwood Dawley. to pass on to night RN to do 02 check.  Pts pediatric walker delivered to room.  Daughter is working on securing a new PCP for pt.  They are trying to obtain Dr.  Felipa Eth as a PCP.  Final orders for bolus TF will need to be written for [facility] if current bolus tube feeding regimen continues to work for pt. [facility] and DME reps are following for any updates. CM will continue to follow.  08/07/14 Marney Doctor RN,BSN,NCM [phone] Spoke with pt and both daughters at bedside on 08/06/14. They had many questions about home health and DC planning. Pt with new peg tube and will receive tube feedings at home.  Trial of continuous tube feeding to get pt to goal today.  If goes well per dietitian pt will then be tried on bolus feedings tomorrow am and should be ready to DC tomorrow afternoon if tolerates bolus feedings well.  Both daughters encouraged to learn to do TF and staff RN Josph Macho is doing teaching with them today.  Per [facility] DME rep staff RN will need to do continuous 02 check tonight to determine need for 02 at night at home.  Check must be done for at least 2 hours and pt must sat below 88% for 5 consecutive minutes on room air.  Start and stop time must be documented and time pt dropped below 88% documented as well.  Instruction given to [employer].  Final orders for DME and TF will be needed prior to DC (  if TF pump and 02 needed).  CM will continue to follow and assist with DC needs.  08/04/2014 1650 NCM spoke to [facility] DME rep and he will follow up with dtr in am about DME delivery. Pt may need hospital bed at home. Jonnie Finner RN CCM Case Mgmt phone [phone]  08/04/2014 1000  NCM spoke to make aware of dc home with DME. States DME will be delivered at dc to home. When they bring concentrator they will deliver suction. Notified [facility] rep that pt will not dc home today. Jonnie Finner RN CCM Case Mgmt phone [phone]   [id number] Rosana Hoes, RN, BSN, CCM Chart reviewed. Discharge needs and patient's stay to be reviewed and followed by case manager.[place]

## 2014-08-08 NOTE — Progress Notes (Signed)
[facility] PROGRESS NOTE Interim History: 80 year old female with past medical history of polymyalgia rheumatica on steroids, severely calcified mitral valve with moderate mitral valve stenosis, chronic diastolic heart failure gangrenous toe on the right, recently discharged from the hospital on 07/24/2014 for healthcare associated pneumonia and encephalopathy. Brought back to the emergency room on 07/28/2014 for shortness of breath fever and hypoxia, chest x-ray showed worsening infiltrates with an elevated white count and a temperature 102. Patient was started on empiric antibiotic swallowing evaluation was done that showed severe oropharyngeal dysphagia now status post PEG placement with nutrition   Assessment/Plan: Sepsis: - on admission she was febrile and noted to be mildly hypotensive with an elevated lactic acid chest x-ray showed persistent infiltrates. - she was started empirically on vancomycin and cefepime, ID was consulted which recommended clindamycin.  Acute hypoxic respiratory failure due to aspiration pneumonia: - chest x-ray on admission showed probable pneumonitis. - Restarted on home dose steroids, started on vancomycin then the escalated to clindamycin. - patient completed her antibiotic treatment on 08/03/2014. - She will follow-up with pulmonary and 08/14/2014 - Patient will be discharged on home oxygen.check oxygen saturations at night laying flat without oxygen for 5 minutes and documented saturations.  Severe oropharyngeal dysphagia: - Family did agree with PEG tube feeding, as PEG tube was clogging, interventional radiology consulted status post PEG placement on 08/06/2014. - We'll need a swallowing evaluation in 2-3 weeks to follow-up with GI as an outpatient Dr. Limmie Patricia or Dr. Amedeo Plenty. - We'll go home with nutrition consult and [facility]. - Continues tube feeding was supposed to be stopped today, family continues to interfere with care. - the  family continues to interfere with goals of care discussion, the patient is doing what the family wants. - The patient relates that she wants everything done to stay alive. - change to bolus feeding.  History of Giardia, Ascaris lumbricoides: - ID and pulmonary did not recommend treating with anti-film intake therapy. - Bronchoscopy was not recommended, as very low suspicion for parasitic infections. An morning concern about aspiration pneumonia. - We'll and parasite continues to be negative, strange abilities antibody negative. Serum quantity of urine negative.  Chronic diastolic heart failure: - Euvolemic BNP of 576. - resume home dose of Lasix.  Polymyalgia rheumatica: - Continue prednisone home dose.  Gangrenous right foot toe: - Unclear etiology, pulses on the right popliteal perfusion. - Follow-up with vascular as an outpatient  Decubitus ulcer: - Wound care consult.  Protein caloric normal nutrition - status post PEG tube on 08/06/2014 by interventional radiologist.   Code Status: Full Code.  Family Communication: Care discussed with Daughter at bedside Disposition Plan: transition meds to PO; hopefully home in 1-2 days   Consultants:  Pulmonary  Infectious disease  Interventional radiologist  Procedures:  PEG tube  Antibiotics:  Vancomycin from 10:30 -11 2  Cefepime 10/31-11/03  Clindamycin 7 days.  HPI/Subjective: Patient has no complain pain is actually controlled. Every time I try to ask the patient about goals of care the daughters continue to interfere. They continue to interfere with him try to have a conversation with the patient and they keep saying she wants to lift she wants everything done.  Objective: Filed Vitals:   08/07/14 0546 08/07/14 1315 08/07/14 2016 08/08/14 0418  BP:  125/61 139/61 134/64  Pulse:  92 95 92  Temp:  98 F (36.7 C) 98.6 F (37 C) 98.1 F (36.7 C)  TempSrc:  Oral Oral Oral  Resp:  16 16 16   Height:        Weight: 58.2 kg (128 lb 4.9 oz)   60.5 kg (133 lb 6.1 oz)  SpO2:  96% 95% 99%    Intake/Output Summary (Last 24 hours) at 08/08/14 0859 Last data filed at 08/08/14 0420  Gross per 24 hour  Intake    826 ml  Output    500 ml  Net    326 ml   Filed Weights   08/07/14 0515 08/07/14 0546 08/08/14 0418  Weight: 58.2 kg (128 lb 4.9 oz) 58.2 kg (128 lb 4.9 oz) 60.5 kg (133 lb 6.1 oz)    Exam:  General: Alert, awake, oriented x3, in no acute distress.  HEENT: No bruits, no goiter.  Heart: Regular rate and rhythm. Lungs: Good air movement,clear Abdomen: Soft, nontender, nondistended, positive bowel sounds.  Neuro: Grossly intact, nonfocal.   Data Reviewed: Basic Metabolic Panel:  Recent Labs Lab 08/03/14 0404 08/05/14 0411 08/08/14 0419  NA 142 137 138  K 3.8 3.3* 3.2*  CL 102 101 102  CO2 25 22 25   GLUCOSE 103* 109* 178*  BUN 24* 18 14  CREATININE 0.63 0.50 0.49*  CALCIUM 8.5 8.4 8.2*  MG 2.6*  --   --    Liver Function Tests: No results for input(s): AST, ALT, ALKPHOS, BILITOT, PROT, ALBUMIN in the last 168 hours. No results for input(s): LIPASE, AMYLASE in the last 168 hours. No results for input(s): AMMONIA in the last 168 hours. CBC:  Recent Labs Lab 08/03/14 0404 08/05/14 0411 08/08/14 0419  WBC 6.9 7.2 7.3  HGB 11.9* 12.4 11.8*  HCT 37.3 38.0 35.9*  MCV 89.2 89.4 89.5  PLT 194 164 136*   Cardiac Enzymes: No results for input(s): CKTOTAL, CKMB, CKMBINDEX, TROPONINI in the last 168 hours. BNP (last 3 results)  Recent Labs  07/19/14 2039 07/28/14 1225 07/29/14 0330  PROBNP 1467.0* 576.5* 753.4*   CBG:  Recent Labs Lab 08/06/14 2047 08/07/14 0121 08/07/14 0751 08/07/14 1208 08/08/14 0724  GLUCAP 133* 105* 108* 186* 162*    Recent Results (from the past 240 hour(s))  Ova and parasite examination     Status: None   Collection Time: 07/29/14  7:28 PM  Result Value Ref Range Status   Specimen Description STOOL  Final   Special Requests  NONE  Final   Ova and parasites   Final    NO OVA OR PARASITES SEEN NO STRONGYLOIDES STERCORALIS SEEN Performed at [facility]    Report Status 07/31/2014 FINAL  Final     Studies: Ir Gastrostomy Tube Mod Sed  08/06/2014   CLINICAL DATA:  80 year old female with dysphagia and protein calorie malnutrition. She has a large hiatal hernia. Placement of a percutaneous gastrostomy tube is warranted for nutrition.  EXAM: PERC PLACEMENT GASTROSTOMY  Date: 08/06/2014  PROCEDURE: 1. Fluoroscopically guided placement of percutaneous pull-through gastrostomy tube. Interventional Radiologist:  Criselda Peaches, MD  ANESTHESIA/SEDATION: Moderate (conscious) sedation was used. 1.5 mg Versed, 50 mcg Fentanyl were administered intravenously. The patient's vital signs were monitored continuously by radiology nursing throughout the procedure.  Sedation Time: 26 minutes  FLUOROSCOPY TIME:  7 min 24 seconds (71 mGy)  CONTRAST:  10 mL Omnipaque 3  MEDICATIONS: 1 g vancomycin was administered intravenously within 1 hour of skin incision.  TECHNIQUE: Informed consent was obtained from the patient following explanation of the procedure, risks, benefits and alternatives. The patient understands, agrees and consents for the procedure. All questions  were addressed. A time out was performed.  Maximal barrier sterile technique utilized including caps, mask, sterile gowns, sterile gloves, large sterile drape, hand hygiene, and chlorhexadine skin prep.  An angled catheter was advanced over a wire under fluoroscopic guidance through the nose, down the esophagus and into the body of the stomach. The stomach was then insufflated with several 100 ml of air. Fluoroscopy confirmed location of the gastric bubble, as well as inferior displacement of the barium stained colon. Under direct fluoroscopic guidance, a single T-tack was placed, and the anterior gastric wall drawn up against the anterior abdominal wall. Percutaneous access  was then obtained into the mid gastric body with an 18 gauge sheath needle. Aspiration of air, and injection of contrast material under fluoroscopy confirmed needle placement.  An Amplatz wire was advanced in the gastric body and the access needle exchanged for a 9-French vascular sheath. A snare device was advanced through the vascular sheath and an Amplatz wire advanced through the angled catheter. The Amplatz wire was successfully snared and this was pulled up through the esophagus and out the mouth. A 20-French Alinda Dooms MIC-PEG tube was then connected to the snare and pulled through the mouth, down the esophagus, into the stomach and out to the anterior abdominal wall. Hand injection of contrast material confirmed intragastric location. The T-tack retention suture was then cut. The pull through peg tube was then secured with the external bumper and capped.  The patient will be observed for several hours with the newly placed tube on low wall suction to evaluate for any post procedure complication. The patient tolerated the procedure well, there is no immediate complication.  IMPRESSION: Successful placement of a 20 French pull through gastrostomy tube.  Signed,  Criselda Peaches, MD  [facility]  [facility]   Electronically Signed   By: Jacqulynn Cadet M.D.   On: 08/06/2014 16:58    Scheduled Meds: . antiseptic oral rinse  7 mL Mouth Rinse q12n4p  . aspirin  81 mg Oral Daily  . chlorhexidine  15 mL Mouth Rinse BID  . enoxaparin (LOVENOX) injection  40 mg Subcutaneous Q24H  . feeding supplement (OSMOLITE 1.5 CAL)  120-237 mL Per Tube Q24H  . feeding supplement (PRO-STAT SUGAR FREE 64)  30 mL Oral BID  . folic acid  1 mg Intravenous Daily  . free water  120 mL Per Tube QID  . furosemide  20 mg Per Tube Daily  . metoprolol tartrate  6.25 mg Per Tube BID  . pantoprazole sodium  40 mg Per Tube BID  . polyvinyl alcohol  1 drop Both Eyes Daily    . potassium chloride  20 mEq Per Tube Daily  . prednisoLONE  5.1 mg Per Tube QAC breakfast  . sodium chloride  3 mL Intravenous Q12H   Continuous Infusions: . feeding supplement (OSMOLITE 1.5 CAL) 1,000 mL (08/07/14 1310)     Venetia Constable Marguarite Arbour  [facility] Pager [phone]. If 8PM-8AM, please contact night-coverage at [email or web address], password [facility] 08/08/2014, 8:59 AM  LOS: 11 days

## 2014-08-08 NOTE — Progress Notes (Signed)
Physical Therapy Treatment Patient Details Name: Martha Mcbride MRN: [id number] DOB: [birth year] Today's Date: 08/08/2014    History of Present Illness 80 yo female admitted with sepsis, hypoxia. Hx of weakness, polymyalgia rheumatica, breast cancer, R foot ischemic toes. Recent d/c from [place] on 10/26. Pt is from home with family.     PT Comments    Pt was able to increase gait tolerance this tx to 25 feet x2 with one sitting rest break on RA; O2 sats after first gait 94%.  Pt is min A with all mobility but requires increased time to perform.    Follow Up Recommendations  Home health PT;Supervision/Assistance - 24 hour     Equipment Recommendations  Rolling walker with 5" wheels    Recommendations for Other Services       Precautions / Restrictions Precautions Precautions: Fall Precaution Comments: NPO Restrictions Weight Bearing Restrictions: No    Mobility  Bed Mobility Overal bed mobility: Needs Assistance Bed Mobility: Supine to Sit     Supine to sit: Min assist;HOB elevated Sit to supine: Min assist   General bed mobility comments: VCs for sequencing and use of siderails; pt required manual handheld A; pt also required min A with sliding to EOB  Transfers Overall transfer level: Needs assistance Equipment used: Rolling walker (2 wheeled) Transfers: Sit to/from Stand Sit to Stand: Min assist         General transfer comment: VCs for hand placement; pt requires increased time  Ambulation/Gait Ambulation/Gait assistance: Min assist Ambulation Distance (Feet): 25 Feet (x2) Assistive device: Rolling walker (2 wheeled) Gait Pattern/deviations: Step-through pattern;Decreased stride length;Trunk flexed Gait velocity: decreased   General Gait Details: pt required one sitting rest break; needs min manual A for RW management   Stairs            Wheelchair Mobility    Modified Rankin (Stroke Patients Only)       Balance                                     Cognition Arousal/Alertness: Awake/alert Behavior During Therapy: WFL for tasks assessed/performed Overall Cognitive Status: Within Functional Limits for tasks assessed                      Exercises Other Exercises Other Exercises: pt too fatiqued for UE exercises this afternoon    General Comments        Pertinent Vitals/Pain Pain Assessment: No/denies pain    Home Living                      Prior Function            PT Goals (current goals can now be found in the care plan section) Progress towards PT goals: Progressing toward goals    Frequency  Min 3X/week    PT Plan Current plan remains appropriate    Co-evaluation             End of Session Equipment Utilized During Treatment: Gait belt Activity Tolerance: Patient tolerated treatment well Patient left: in chair;with call bell/phone within reach;with family/visitor present     Time: 2010-0712 PT Time Calculation (min) (ACUTE ONLY): 23 min  Charges:                       G Codes:      Miller,Derrick,  SPTA 08/08/2014, 3:41 PM   Reviewed above  Rica Koyanagi  PTA [facility]  Acute  Rehab Pager      [phone]

## 2014-08-08 NOTE — Plan of Care (Signed)
Problem: Phase II Progression Outcomes Goal: IV changed to normal saline lock Outcome: Completed/Met Date Met:  08/08/14 Goal: Other Phase II Outcomes/Goals Outcome: Completed/Met Date Met:  08/08/14  Problem: Phase III Progression Outcomes Goal: Voiding independently Outcome: Completed/Met Date Met:  08/08/14 Goal: Foley discontinued Outcome: Not Applicable Date Met:  73/41/93

## 2014-08-08 NOTE — Progress Notes (Signed)
Brief NUTRITION FOLLOW UP  Patient tolerated the bolus TF well with 4 oz of Osmolite 1.5.  Discussed with nurse.  Patient held the tube and helped feed herself.    Will discontinue continuous feeding. Continue bolus feedings.  Home TF instruction book provided in room with details of patient's requirements and schedule for home.    RD to follow.  Antonieta Iba, RD, LDN Clinical Inpatient Dietitian Pager:  [phone] Weekend and after hours pager:  [phone]   Co worker pager [phone].

## 2014-08-08 NOTE — Progress Notes (Signed)
NUTRITION FOLLOW UP  Intervention:    NPO except for ice chips  Hold Continuous feeding.    Continue prostat 30 ml bid per G-tube.  Will transition to bolus feedings with 1/2 can Osmolite 1.5 at noon today and to increase to 1 can Osmolite 1.5 4 times daily.  Home Goal:  Bolus feedings with Osmolite 1.5 1 can 4 times daily (8 am, noon, 4 pm, 8 pm),  Plus an additional 20-30 gm protein daily per tube.  Flush tube with 60 ml water before and after each feeding.  Patient needs an additional 240 ml of water daily which patient can receive with ice chips po or free water per tube.  Calorie goals 1400-1600 kcal, 80-95 gm protein daily. Recommended that head of the bed be raised to prevent aspiration even during sleep secondary to hx acid reflux.  Noted swallow to be re-evaluated in a couple of weeks.  Met with daughter [name] and answered question and educated.  Monitor TF tolerance  RD to follow closely and educate daughter and other family members as needed.  Nutrition Dx:   Inadequate oral intake related to decreased appetite/taste changes/nausea as evidenced by PO intake < 75%.; ongoing, now related to NPO status  Goal:   TF to meet >/= 90% of their estimated nutrition needs, not yet met  Monitor:   TF tolerance, swallow profile, total protein/energy intake, labs, weights, skin integrity  Assessment:   80 y.o. female with PMH significant for Polymyalgia on prednisone, severely calcified mitral valve with moderate stenosis, chronic diastolic heart failure,Gangrenous toes on the right foot , last admission 10-27 was treated for health care associated PNA, encephalopathy, and diastolic HF exacerbation.   Call received from daughter [name] this am with wishes to change TF formula secondary to diarrhea.  Discussed with RN, MD and chart reviewed.  Patient with smears on bed cover and pudding like consistency when using the bathroom.  Educated on stool with TF formula and issues with changing  TF.  Daughter is now agreeable to turning off TF and starting bolus feeding at 12:30 today.  Height: Ht Readings from Last 1 Encounters:  07/28/14 _0  (1.549 m)    Weight Status:   Wt Readings from Last 1 Encounters:  08/08/14 133 lb 6.1 oz (60.5 kg)  07/30/14 146 lb  Re-estimated needs (from new weight):  Kcal: 1500-1700 Protein: 80-95 gram Fluid: >/= 1600 ml daily  Skin: stage 2 PU on buttock, gangreneous toes on right foot  Diet Order:     Intake/Output Summary (Last 24 hours) at 08/08/14 1042 Last data filed at 08/08/14 0855  Gross per 24 hour  Intake    826 ml  Output    700 ml  Net    126 ml    Last BM: 11/6   Labs:   Recent Labs Lab 08/03/14 0404 08/05/14 0411 08/08/14 0419  NA 142 137 138  K 3.8 3.3* 3.2*  CL 102 101 102  CO2 _1 BUN 24* 18 14  CREATININE 0.63 0.50 0.49*  CALCIUM 8.5 8.4 8.2*  MG 2.6*  --   --   GLUCOSE 103* 109* 178*    CBG (last 3)   Recent Labs  08/07/14 0751 08/07/14 1208 08/08/14 0724  GLUCAP 108* 186* 162*    Scheduled Meds: . antiseptic oral rinse  7 mL Mouth Rinse q12n4p  . aspirin  81 mg Oral Daily  . chlorhexidine  15 mL Mouth Rinse BID  . enoxaparin (  LOVENOX) injection  40 mg Subcutaneous Q24H  . feeding supplement (OSMOLITE 1.5 CAL)  120-237 mL Per Tube Q24H  . feeding supplement (PRO-STAT SUGAR FREE 64)  30 mL Oral BID  . folic acid  1 mg Intravenous Daily  . free water  120 mL Per Tube QID  . furosemide  20 mg Per Tube Daily  . metoprolol tartrate  6.25 mg Per Tube BID  . pantoprazole sodium  40 mg Per Tube BID  . polyvinyl alcohol  1 drop Both Eyes Daily  . potassium chloride  20 mEq Per Tube Daily  . prednisoLONE  5.1 mg Per Tube QAC breakfast  . sodium chloride  3 mL Intravenous Q12H    Continuous Infusions: . feeding supplement (OSMOLITE 1.5 CAL)      Antonieta Iba, RD, LDN Clinical Inpatient Dietitian Pager:  [phone] Weekend and after hours pager:  [phone]

## 2014-08-08 NOTE — Progress Notes (Signed)
Per continuous tube feeds to be held at 0400 in anticipation of tube feeds starting this AM. This RN discussed this with pt's daughter who stated, "my mom's nutritional status has been delayed for several days waiting for the PEG placement and for it to be ready and I don't want her to miss out on her nutrition. Please don't stop her tube feeds." This RN educated pt's daughter on the purpose of holding the feeds to prepare her stomach for the bolus feeds. Currently no order for a bolus feed to start at 0800. Pt's daughter asked for tube feeds to continue until the bolus time. Tube feeds continued per daughter's request. This RN to pass on to day shift to notify dietician of need for bolus tube feed orders. Noreene Larsson RN, BSN

## 2014-08-08 NOTE — Progress Notes (Signed)
Occupational Therapy Treatment Patient Details Name: Martha Mcbride MRN: [id number] DOB: Apr 01, 1925 Today's Date: 08/08/2014    History of present illness 80 yo female admitted with sepsis, hypoxia. Hx of weakness, polymyalgia rheumatica, breast cancer, R foot ischemic toes. Recent d/c from [place] on 10/26. Pt is from home with family.    OT comments  Pt very fatiqued this pm.  Needed to use commode.  Did not have enough energy to work on exercises  Follow Up Recommendations  Home health OT;Supervision/Assistance - 24 hour    Equipment Recommendations  None recommended by OT    Recommendations for Other Services      Precautions / Restrictions Precautions Precautions: Fall Precaution Comments: NPO       Mobility Bed Mobility   Bed Mobility: Supine to Sit     Supine to sit: Min assist Sit to supine: Min assist   General bed mobility comments: assist for trunk, with HOB raised when getting OOB; assist with legs for back to bed  Transfers   Equipment used: Rolling walker (2 wheeled) Transfers: Sit to/from Stand Sit to Stand: Min assist         General transfer comment: cues for UE placement    Balance                                   ADL Overall ADL's : Needs assistance/impaired         Upper Body Bathing: Set up;Sitting       Upper Body Dressing : Minimal assistance;Sitting       Toilet Transfer: Minimal assistance;Stand-pivot;BSC   Toileting- Clothing Manipulation and Hygiene: Total assistance;Sit to/from stand         General ADL Comments: pt needed to use commode:  SPT to 3:1.  Pt was sweaty:  performed quick UB bath and changed gown.  Pt with decreased endurance today.  Repositioned in bed on R side. A lady who will be one of her caregivers at home was present and willing to assist.      Vision                     Perception     Praxis      Cognition   Behavior During Therapy: [facility] for tasks  assessed/performed Overall Cognitive Status: Within Functional Limits for tasks assessed                       Extremity/Trunk Assessment               Exercises Other Exercises Other Exercises: pt too fatiqued for UE exercises this afternoon   Shoulder Instructions       General Comments      Pertinent Vitals/ Pain       Pain Assessment: No/denies pain  Home Living                                          Prior Functioning/Environment              Frequency Min 2X/week     Progress Toward Goals  OT Goals(current goals can now be found in the care plan section)  Progress towards OT goals: Progressing toward goals (slowly)  Acute Rehab OT Goals Time For Goal Achievement: 08/15/14  Plan  Co-evaluation                 End of Session     Activity Tolerance Patient limited by fatigue   Patient Left in bed;in CPM;with family/visitor present;with bed alarm set   Nurse Communication          Time: 6122-4497 OT Time Calculation (min): 23 min  Charges: OT General Charges $OT Visit: 1 Procedure OT Treatments $Self Care/Home Management : 23-37 mins  Pamula Luther 08/08/2014, 1:55 PM Lesle Chris, OTR/L [phone] 08/08/2014

## 2014-08-09 DIAGNOSIS — E871 Hypo-osmolality and hyponatremia: Secondary | ICD-10-CM

## 2014-08-09 LAB — GLUCOSE, CAPILLARY
GLUCOSE-CAPILLARY: 110 mg/dL — AB (ref 70–99)
GLUCOSE-CAPILLARY: 123 mg/dL — AB (ref 70–99)
GLUCOSE-CAPILLARY: 206 mg/dL — AB (ref 70–99)
Glucose-Capillary: 111 mg/dL — ABNORMAL HIGH (ref 70–99)
Glucose-Capillary: 147 mg/dL — ABNORMAL HIGH (ref 70–99)
Glucose-Capillary: 162 mg/dL — ABNORMAL HIGH (ref 70–99)
Glucose-Capillary: 172 mg/dL — ABNORMAL HIGH (ref 70–99)
Glucose-Capillary: 147 mg/dL — ABNORMAL HIGH (ref 70–99)
Glucose-Capillary: 210 mg/dL — ABNORMAL HIGH (ref 70–99)
Glucose-Capillary: 140 mg/dL — ABNORMAL HIGH (ref 70–99)

## 2014-08-09 MED ORDER — PANTOPRAZOLE SODIUM 40 MG PO PACK: 40.0000 mg | PACK | Freq: Two times a day (BID) | ORAL | Status: DC

## 2014-08-09 MED ORDER — PRO-STAT SUGAR FREE PO LIQD: 30.0000 mL | Freq: Two times a day (BID) | ORAL | Status: DC

## 2014-08-09 MED ORDER — PREDNISOLONE 15 MG/5ML PO SOLN: 5.0000 mg | Freq: Every day | ORAL | Status: DC

## 2014-08-09 MED ORDER — FUROSEMIDE 8 MG/ML PO SOLN: 20.0000 mg | Freq: Every day | ORAL | Status: DC

## 2014-08-09 MED ORDER — METOPROLOL TARTRATE 25 MG/10 ML ORAL SUSPENSION: 6.2500 mg | Freq: Two times a day (BID) | ORAL | Status: DC

## 2014-08-09 MED ORDER — PRO-STAT SUGAR FREE PO LIQD
30.0000 mL | Freq: Every morning | ORAL | Status: DC
  Administered 2014-08-10: 30 mL
  Filled 2014-08-09: qty 30

## 2014-08-09 MED ORDER — POTASSIUM CHLORIDE 20 MEQ/15ML (10%) PO SOLN: 20.0000 meq | Freq: Every day | ORAL | Status: DC

## 2014-08-09 MED ORDER — FREE WATER: 120.0000 mL | Freq: Four times a day (QID) | Status: DC

## 2014-08-09 MED ORDER — OSMOLITE 1.5 CAL PO LIQD: 120.0000 mL | ORAL | Status: DC

## 2014-08-09 MED ORDER — JEVITY 1.2 CAL PO LIQD
237.0000 mL | Freq: Every day | ORAL | Status: DC
  Administered 2014-08-09: 237 mL
  Administered 2014-08-09: 120 mL
  Administered 2014-08-09 – 2014-08-10 (×4): 237 mL
  Filled 2014-08-09 (×8): qty 237

## 2014-08-09 NOTE — Progress Notes (Signed)
Physical Therapy Treatment Patient Details Name: Martha Mcbride MRN: [id number] DOB: 10-12-1924 Today's Date: 08/09/2014    History of Present Illness 80 yo female admitted with sepsis, hypoxia. Hx of weakness, polymyalgia rheumatica, breast cancer, R foot ischemic toes. Recent d/c from [place] on 10/26. Pt is from home with family.     PT Comments    Pt was seen in recliner and given A to restroom and assistance with hygiene.  Pt had frequent uncontrolled loose stools.  Pt reports increased fatigue this session compared to last.    Follow Up Recommendations  Home health PT;Supervision/Assistance - 24 hour     Equipment Recommendations  Rolling walker with 5" wheels    Recommendations for Other Services       Precautions / Restrictions Precautions Precautions: Fall Precaution Comments: NPO Restrictions Weight Bearing Restrictions: No    Mobility  Bed Mobility Overal bed mobility: Needs Assistance Bed Mobility: Sit to Supine;Rolling Rolling: Min guard     Sit to supine: Min assist   General bed mobility comments: Pt requires A with LEs onto bed  Transfers Overall transfer level: Needs assistance Equipment used: Rolling walker (2 wheeled) Transfers: Sit to/from Stand Sit to Stand: Min assist         General transfer comment: sit to stand 4x from recliner and BSC; pt had frequent loose stools; pt requires increased time to perform  Ambulation/Gait Ambulation/Gait assistance: Min assist Ambulation Distance (Feet): 10 Feet Assistive device: Rolling walker (2 wheeled) Gait Pattern/deviations: Step-through pattern;Decreased stride length;Trunk flexed Gait velocity: decreased   General Gait Details: pt completed gait from recliner to bathroom; requires increased time and VCs for sequencing and RW management while navigating around obstacles in [employer] forces [employer] education [employer] [place] (Stroke Patients Only)       Balance                                     Cognition Arousal/Alertness: Awake/alert Behavior During Therapy: WFL for tasks assessed/performed Overall Cognitive Status: Within Functional Limits for tasks assessed                      Exercises      General Comments        Pertinent Vitals/Pain Pain Assessment: No/denies pain    Home Living                      Prior Function            PT Goals (current goals can now be found in the care plan section) Progress towards PT goals: Progressing toward goals    Frequency  Min 3X/week    PT Plan Current plan remains appropriate    Co-evaluation             End of Session Equipment Utilized During Treatment: Gait belt Activity Tolerance: Patient tolerated treatment well Patient left: in bed;with call bell/phone within reach;with family/visitor present     Time: 6283-6629 PT Time Calculation (min) (ACUTE ONLY): 25 min  Charges:                       G Codes:      Miller,Derrick, SPTA 08/09/2014, 3:24 PM   Reviewed above  Rica Koyanagi  PTA [facility]  Acute  Rehab Pager      [phone]

## 2014-08-09 NOTE — Progress Notes (Signed)
Patient tolerated first dose of Jevity 1.2 (120 ml) without any problems.  Will continue to monitor for diarrhea, abdominal pain/distension, or nausea/vomiting.  Zandra Abts [facility]  08/09/2014

## 2014-08-09 NOTE — Progress Notes (Signed)
Oxygen saturation study started on  11/11 at 2150  Bed time with continous pulse ox placed . Sat at 96 room air lying flat. Pulse ox beeped at 2250 sat dropped to 86 for about a minute and went back up to 92. At 2345 pulse ox went off sat at 83% and lasted less than a minute and went back up 94%.  End time of study is 08/09/14 at 0144.

## 2014-08-09 NOTE — Plan of Care (Signed)
Problem: Consults Goal: General Medical Patient Education See Patient Education Module for specific education.  Outcome: Completed/Met Date Met:  08/09/14 Goal: Skin Care Protocol Initiated - if Braden Score 18 or less If consults are not indicated, leave blank or document N/A  Outcome: Completed/Met Date Met:  08/09/14  Problem: Phase III Progression Outcomes Goal: Pain controlled on oral analgesia Outcome: Completed/Met Date Met:  08/09/14 Goal: Activity at appropriate level-compared to baseline (UP IN CHAIR FOR HEMODIALYSIS)  Outcome: Completed/Met Date Met:  08/09/14 Goal: IV/normal saline lock discontinued Outcome: Completed/Met Date Met:  08/09/14 Goal: Discharge plan remains appropriate-arrangements made Outcome: Completed/Met Date Met:  08/09/14 Goal: Other Phase III Outcomes/Goals Outcome: Not Applicable Date Met:  67/70/34  Problem: Discharge Progression Outcomes Goal: Discharge plan in place and appropriate Outcome: Completed/Met Date Met:  08/09/14 Goal: Pain controlled with appropriate interventions Outcome: Completed/Met Date Met:  08/09/14 Goal: Hemodynamically stable Outcome: Completed/Met Date Met:  03/52/48 Goal: Complications resolved/controlled Outcome: Completed/Met Date Met:  08/09/14 Goal: Tolerating diet Outcome: Adequate for Discharge Goal: Activity appropriate for discharge plan Outcome: Completed/Met Date Met:  08/09/14 Goal: Other Discharge Outcomes/Goals Outcome: Not Applicable Date Met:  18/59/09

## 2014-08-09 NOTE — Progress Notes (Signed)
Patient has had 2 very loose BM's this shift.  The BM's are dark brown liquid and watery.  Patient walked to bathroom with PT and had incontinence of liquid BM.  She also had liquid BM overnight last night.  Dr. Olevia Bowens notified and discharge canceled.  Will implement nurse driven C-diff protocol.  Notified Harriet Butte, RD and she changed the tube feeding formula to see if that will assist with decreasing the diarrhea.  Patient's daughter, [name], was notified and was appreciative of the information.  Zandra Abts [facility]  08/09/2014  3:28 PM

## 2014-08-09 NOTE — Progress Notes (Addendum)
Pt and family refused  Night time feed. Pt daughter  States "she had too much and making her very uncomfortable and too full" and will like to skip feed and do 120 cc in the morning.

## 2014-08-09 NOTE — Progress Notes (Signed)
Nutrition Note  After further discussion with pt's daughter, RD contacted [place] for possible elemental formulas available to pt if still unable to tolerate Jevity 1.2  Discussed pt's current condition with [facility] nutrition team. Due to pt's ongoing GI intolerance, 3 loose stools in 24 hours and malabsorption, pt would qualify to receive elemental Vital 1.2 AF upon d/c. Informed pt's daughter of availability of Vital 1.2 AF if needed.  Pt tolerated first 120 ml bolus of Jevity 1.2. Will continue to assess tolerance for possible modification. Left pager and contact information with NT, encouraging to be contacted with any signs of further intolerance.  Atlee Abide MS RD LDN Clinical Dietitian [phone]

## 2014-08-09 NOTE — Progress Notes (Signed)
Nutrition Note  -Patient's daughter contacted RD d/t concern for ongoing loose stools and hydration status. Discussed possibility of medications contributing, and confirmed with family that RD would look into possible solutions to decrease episode severity and frequency -Per discussion with RN, MD noted that anti-diarrheals would likely not be effective. - RD and Case Manager requested stool sample be sent for C.diff. Results pending.  -RN confirmed that they are have been infusing each bolus feeds over 25 minutes and have divided 240 ml bolus into two 120 ml feeds; but both techniques has not been proven effective  -Reviewed medications; did not identify any major contributor -RD received consult by MD to modify tube feed formula due to frequent episodes of loose stools -Will trial Jevity 1.2 formula and assess tolerance. Pt may benefit from higher fiber and higher water content formula to assist in forming stools. Will require additional bolus as formula less calorically dense. Will also still require 15-20 gram protein daily per tube.  Jevity 1.2 at goal rate of five cans with 30 ml Pro-Stat once daily will provide 1525 kcal (100% est kcal needs), 80 gram (100% est kcal needs), and 955 ml free water   Home Goal: Bolus feedings with Jevity 1.2 at 1 can 5 times daily (6 am, 10 am, 2 pm, 6 pm, 10 pm-family can modify times of schedule as needed), Plus an additional 20 gm protein daily per tube. Flush tube with 60 ml water before and after each feeding. Patient needs an additional 120 ml of water daily which patient can receive with ice chips po or free water per tube. Calorie goals 1400-1600 kcal, 80-95 gm protein daily  Arranged to update family once they arrive at the hospital. Please consult as needed  [place] Clinical Dietitian [phone]

## 2014-08-09 NOTE — Progress Notes (Signed)
Patient's daughter Bonney Roussel arrived to room a little after 4 pm.  She was very emotional and upset regarding her mother's care.  She had specific issues related to the changes in tube feedings, related to her mother's skin care, related to issues with [place], and related to the diarrhea that her mother has been experiencing.  I listened to her concerns.  Harriet Butte, the dietician, was also present and listened and gave further information regarding the changes in tube feeds.  [name] was insistent that the stool sample be sent immediately for C-diff and that she wanted the results tonight.  I did get patient to the bedside commode to see if sample could be obtained but patient only urinated.  Patient placed back in bed.  Patient turned on side and buttocks and perineal area cleansed thoroughly and barrier cream applied.  Patient had foam Allevyn dressing on sacrum but it was soiled.  I removed this dressing and cleansed skin and then applied new Allevyn dressing.  [name] and patient's other daughter [name] were asking about another type of dressing and cream and showed me the names but they are not ones that are currently used nor were they recommended by the [facility] RN.  [name] stated she was going to call to pharmacies and find the dressing and ointment and that it would be used on her mother.  I continued to actively listen and offer information and support but very difficult to resolve her concerns/complaints.  [name] will need continued support prior to patient's discharge.  [name] was requesting that patient be started on a probiotic.  Dr. Olevia Bowens was sent a text page regarding this.  Zandra Abts [facility]  08/09/2014  5:41 PM

## 2014-08-09 NOTE — Discharge Summary (Addendum)
Physician Discharge Summary  Martha Mcbride VXY:[id number] DOB: [birth year] DOA: 07/28/2014  PCP: Simona Huh, MD  Admit date: 07/28/2014 Discharge date: 08/10/2014  Time spent: 35 minutes  Recommendations for Outpatient Follow-up:  1. Follow up with PCP in 1-2 weeks. Check a b-met. 2. Follow up with GI 2-4 weeks. Will need a SLP 3. Follow up with Pulmonary in 2-4 week.  Discharge Diagnoses:  Principal Problem:   Sepsis Active Problems:   Hyponatremia   Immune disorder   Fever   Diastolic HF (heart failure)   Chronic diastolic heart failure   Acute respiratory failure with hypoxemia   Dyspnea   Pyrexia   Hypoxemia   Pulmonary infiltrate   Malnutrition of moderate degree   Aspiration into airway   Failure to thrive in adult   Blood poisoning   Dysphagia   Discharge Condition: guarded  Diet recommendation: tube feeding  Filed Weights   08/08/14 0418 08/09/14 0457 08/10/14 0420  Weight: 60.5 kg (133 lb 6.1 oz) 59.603 kg (131 lb 6.4 oz) 59.421 kg (131 lb)    History of present illness:  80 y.o. female with PMH significant for Polymyalgia on prednisone, severely calcified mitral valve with moderate stenosis, chronic diastolic heart failure,Gangrenous toes on the right foot , last admission 10-27 was treated for health care associated PNA, encephalopathy, and diastolic HF exacerbation.   She was brought to the emergency department today for worsening shortness of breath today and hypoxia as noted by a home pulse oximeter that the daughter has. Per daughter patient has been having problem of low oxygen level at night during sleep. She was trying to arrange home oxygen trough patient PCP but was unsuccessful. Today patient oxygen level was low.   Evaluation in the ED: Patient was found to be febrile, with tempeture at 103. Per family patient has been more confuse today.  Patient denies diarrhea, no chest pain, or dyspnea. Cough is better, but still with productive cough.  WBC at 12, Lactic acid at 3.5. Chest x ray with No significant change in cardiomegaly and diffuse bilateral pulmonary interstitial infiltrates. Patient relates dysuria.   Hospital Course:  Sepsis: - On admission she was febrile and noted to be mildly hypotensive with an elevated lactic acid chest x-ray showed persistent infiltrates. - she was started empirically on vancomycin and cefepime, ID was consulted which recommended clindamycin.  Acute hypoxic respiratory failure due to aspiration pneumonia: - chest x-ray on admission showed probable pneumonitis. - Restarted on home dose steroids, started on vancomycin and cefepime then the escalated to clindamycin. - patient completed her antibiotic treatment on 08/03/2014. - She will follow-up with pulmonary and 08/14/2014 - Patient will be discharged on home oxygen. - as when she lays flat to sleep she detaurates to 83%.  Severe oropharyngeal dysphagia: - Family did agree with PEG tube feeding, as PEG tube was clogging, interventional radiology consulted status post PEG placement on 08/06/2014. - We'll need a swallowing evaluation in 2-3 weeks to follow-up with GI as an outpatient Dr. Limmie Patricia or Dr. Amedeo Plenty. - We'll go home with nutrition consult and advanced home health. - Continues tube feeding was supposed to be stopped today, family continues to interfere with care. - The family continues to interfere with goals of care discussion, every time the pt's gets ask about GOC family answers for her. - The patient relates that she wants everything done to stay alive. - change to bolus feeding. Changed most of her meds to liq.  History of Giardia,  Ascaris lumbricoides: - ID and pulmonary did not recommend treating with anti-parasitic. - Bronchoscopy was not recommended, as very low suspicion for parasitic infections. An morning concern about aspiration pneumonia. - We'll and parasite continues to be negative, strange abilities antibody negative.  Serum quantity of urine negative.  Chronic diastolic heart failure: - Euvolemic BNP of 576. - resume home dose of Lasix.  Polymyalgia rheumatica: - Continue prednisone home dose.  Gangrenous right foot toe: - Unclear etiology, pulses on the right popliteal perfusion. - Follow-up with vascular as an outpatient  Decubitus ulcer: - Wound care consult.  Protein caloric normal nutrition - status post PEG tube on 08/06/2014 by interventional radiologist.   Code Status: Full Code.  Family Communication: Care discussed with Daughter at bedside Disposition Plan: transition meds to PO; hopefully home in 1-2 days  Procedures:  IR place PEGt ube  Consultations:  GI  IR  Discharge Exam: Filed Vitals:   08/10/14 1430  BP: 131/67  Pulse: 105  Temp: 97.8 F (36.6 C)  Resp: 16    General: A&O x3 Cardiovascular: RRR Respiratory: good air movement CTA B/L  Discharge Instructions You were cared for by a hospitalist during your hospital stay. If you have any questions about your discharge medications or the care you received while you were in the hospital after you are discharged, you can call the unit and asked to speak with the hospitalist on call if the hospitalist that took care of you is not available. Once you are discharged, your primary care physician will handle any further medical issues. Please note that NO REFILLS for any discharge medications will be authorized once you are discharged, as it is imperative that you return to your primary care physician (or establish a relationship with a primary care physician if you do not have one) for your aftercare needs so that they can reassess your need for medications and monitor your lab values.  Discharge Instructions    Increase activity slowly    Complete by:  As directed      Increase activity slowly    Complete by:  As directed           Current Discharge Medication List    START taking these medications   Details   furosemide (LASIX) 8 MG/ML solution Place 2.5 mLs (20 mg total) into feeding tube daily. Qty: 30 mL, Refills: 0    metoprolol tartrate (LOPRESSOR) 25 mg/10 mL SUSP Place 2.5 mLs (6.25 mg total) into feeding tube 2 (two) times daily. Qty: 30 mL, Refills: 0    !! Nutritional Supplements (FEEDING SUPPLEMENT, JEVITY 1.2 CAL,) LIQD Place 237 mLs into feeding tube 5 (five) times daily. Qty: 237 mL, Refills: 0    !! Nutritional Supplements (FEEDING SUPPLEMENT, OSMOLITE 1.5 CAL,) LIQD Place 120-237 mLs into feeding tube daily. Qty: 30 Bottle, Refills: 0    pantoprazole sodium (PROTONIX) 40 mg/20 mL PACK Place 20 mLs (40 mg total) into feeding tube 2 (two) times daily. Qty: 30 each, Refills: 0    potassium chloride 20 MEQ/15ML (10%) SOLN Place 15 mLs (20 mEq total) into feeding tube daily. Qty: 450 mL, Refills: 0    prednisoLONE (PRELONE) 15 MG/5ML SOLN Place 1.7 mLs (5.1 mg total) into feeding tube daily before breakfast. Qty: 450 mL, Refills: 0    !! Water For Irrigation, Sterile (FREE WATER) SOLN Place 120 mLs into feeding tube 4 (four) times daily.    !! Water For Irrigation, Sterile (FREE WATER) SOLN Place 120 mLs  into feeding tube 5 (five) times daily.     !! - Potential duplicate medications found. Please discuss with provider.    CONTINUE these medications which have NOT CHANGED   Details  antiseptic oral rinse (BIOTENE) LIQD 15 mLs by Mouth Rinse route as needed for dry mouth.    aspirin 81 MG chewable tablet Chew 1 tablet (81 mg total) by mouth daily.    benzonatate (TESSALON) 200 MG capsule Take 1 capsule (200 mg total) by mouth 3 (three) times daily as needed for cough. Qty: 20 capsule, Refills: 0    folic acid (FOLVITE) 1 MG tablet Take 1 mg by mouth daily.    hydroxypropyl methylcellulose / hypromellose (ISOPTO TEARS / GONIOVISC) 2.5 % ophthalmic solution Place 1 drop into both eyes every morning.    loratadine (CLARITIN) 10 MG tablet Take 1 tablet (10 mg total) by  mouth daily. Qty: 30 tablet, Refills: 1    RABEprazole (ACIPHEX) 20 MG tablet Take 20 mg by mouth daily.    saccharomyces boulardii (FLORASTOR) 250 MG capsule Take 1 capsule (250 mg total) by mouth 2 (two) times daily. Qty: 60 capsule, Refills: 0      STOP taking these medications     fluconazole (DIFLUCAN) 100 MG tablet      furosemide (LASIX) 20 MG tablet      levofloxacin (LEVAQUIN) 750 MG tablet      metoprolol succinate (TOPROL-XL) 12.5 mg TB24 24 hr tablet      potassium chloride SA (K-DUR,KLOR-CON) 20 MEQ tablet      predniSONE (DELTASONE) 5 MG tablet        Allergies  Allergen Reactions  . Doxycycline Nausea And Vomiting  . Other Nausea And Vomiting and Other (See Comments)    Anesthesia makes her very sleepy and makes it hard for her to come out of it.   Marland Kitchen Penicillins Hives and Nausea And Vomiting  . Sulfa Antibiotics Nausea And Vomiting      The results of significant diagnostics from this hospitalization (including imaging, microbiology, ancillary and laboratory) are listed below for reference.    Significant Diagnostic Studies: Dg Chest 2 View  07/28/2014   CLINICAL DATA:  Worsening hypoxia and lethargy. Recent pneumonia. Breast carcinoma.  EXAM: CHEST  2 VIEW  COMPARISON:  07/23/2014  FINDINGS: Diffuse pulmonary interstitial infiltrates show no significant change. No evidence of superimposed consolidation or pleural effusion. Cardiomegaly remains stable.  IMPRESSION: No significant change in cardiomegaly and diffuse bilateral pulmonary interstitial infiltrates.   Electronically Signed   By: Earle Gell M.D.   On: 07/28/2014 13:04   Dg Chest 2 View  07/23/2014   CLINICAL DATA:  Persistent difficulty breathing and cough  EXAM: CHEST  2 VIEW  COMPARISON:  July 19, 2014 and June 02, 2014  FINDINGS: There is underlying emphysema. There is diffuse interstitial edema. Heart is mildly enlarged. The pulmonary vascularity reflects pulmonary venous congestion  superimposed on emphysematous change. There are compression fractures in the thoracic spine, stable. No adenopathy. There is atherosclerotic change in the aorta.  IMPRESSION: Findings are felt to be most consistent with congestive heart failure superimposed on emphysematous change. There is no frank airspace consolidation. No new opacity compared to most recent prior study.   Electronically Signed   By: Lowella Grip M.D.   On: 07/23/2014 08:53   Dg Chest 2 View  07/19/2014   CLINICAL DATA:  Cough and weakness.  History of breast cancer.  EXAM: CHEST  2 VIEW  COMPARISON:  06/02/2014.  FINDINGS: Poor inspiration. Mildly enlarged cardiac silhouette with an interval decrease in size. Interval patchy airspace opacity throughout the majority of both lungs. No definite pleural fluid. Diffuse osteopenia. Stable thoracolumbar spine compression deformity and atheromatous arterial calcifications.  IMPRESSION: Poor inspiration with interval diffuse bilateral pneumonia or alveolar edema.   Electronically Signed   By: Gordan Payment M.D.   On: 07/19/2014 21:30   Dg Abd 1 View  08/02/2014   CLINICAL DATA:  Nasogastric tube placement.  EXAM: ABDOMEN - 1 VIEW  COMPARISON:  CT abdomen and pelvis 05/21/2012.  Chest CT 07/30/2014.  FINDINGS: Enteric tube is looped over the stomach with distal end directed cranially and tip projecting over the lower mediastinum. Recent chest CT demonstrates a hiatal hernia in this location. Oral contrast material is present in the ascending and transverse colon. No dilated loops of bowel are seen to suggest obstruction. There is chronic elevation of the right hemidiaphragm. Dense mitral annular calcifications partially visualized. Mild lumbar levoscoliosis is noted.  IMPRESSION: Enteric tube looped in the stomach with tip directed cranially over the lower mediastinum, likely in the gastric fundus in the herniated portion of the stomach. Consider retracting 10-15 cm to reduce the amount of  excess tube in the stomach and place the tip in the gastric body.  These results will be called to the ordering clinician or representative by the Radiologist Assistant, and communication documented in the PACS or zVision Dashboard.   Electronically Signed   By: Sebastian Ache   On: 08/02/2014 10:35   Ct Chest Wo Contrast  07/30/2014   CLINICAL DATA:  Fever and elevated white blood cell count. Chronic shortness of breath and chest pain. History of breast carcinoma in 2000  EXAM: CT CHEST WITHOUT CONTRAST  TECHNIQUE: Multidetector CT imaging of the chest was performed following the standard protocol without IV contrast material administration.  COMPARISON:  Chest radiograph August 08, 2014  FINDINGS: There is underlying emphysematous change with interstitial fibrosis and honeycombing throughout both upper lobes, primarily medially as well as in the lingula, right middle lobe, and both lower lobes. There is diffuse traction type bronchiectasis.  There are moderate free-flowing effusions bilaterally. There are areas of consolidation in both lower lobes in a somewhat patchy distribution. Lesser degrees of airspace consolidation are noted in both upper lobes.  On axial slice 32 series 5, there is a 5 mm nodular opacity in the posterior segment of the right upper lobe.  There is atherosclerotic change in the aorta but no aneurysm. No pulmonary embolus is seen on this noncontrast enhanced study. There is extensive calcification in the mitral valve and aortic valve regions.  There are multiple small lymph nodes throughout the mediastinum. There is a lymph node in the anterior precarinal region measuring 1.6 by 1.4 cm. A second lymph node in this area measures 2.0 x 1.2 cm. A lymph node in the aortopulmonary window region measures 1.5 x 1.3 cm.  There is a moderate hiatal hernia.  In the visualized upper abdominal region, there is atherosclerotic change in the aorta. There is cholelithiasis. There is a small cyst in the  posterior segment of the right lobe of the liver.  There is degenerative change in the thoracic spine with increase in kyphosis. There are no blastic or lytic bone lesions. Thyroid appears unremarkable.  IMPRESSION: Evidence of emphysema with traction bronchiectasis in multiple areas of interstitial fibrosis. This appearance is consistent with underlying usual interstitial pneumonitis.  There are areas of patchy consolidation  in both lower lobes as well as to a lesser extent in the upper lobes. There also moderate pleural effusions. The areas of consolidation are felt to most likely represent superimposed pneumonia. There may be a degree of superimposed congestive heart failure as well given the pleural effusions.  5 mm nodular opacity in the posterior segment of the right upper lobe. Followup of this nodular opacity should be based on Fleischner Society guidelines. If the patient is at high risk for bronchogenic carcinoma, follow-up chest CT at 6-12 months is recommended. If the patient is at low risk for bronchogenic carcinoma, follow-up chest CT at 12 months is recommended. This recommendation follows the consensus statement: Guidelines for Management of Small Pulmonary Nodules Detected on CT Scans: A Statement from the [place] as published in Radiology 2005;237:395-400.  Areas of atherosclerotic change and cardiac valvular calcification.  Areas of relatively mild adenopathy.  Cholelithiasis.  Given the areas of questionable pneumonia, it may be prudent to consider a followup study in 2-3 weeks to assess for clearing.   Electronically Signed   By: Lowella Grip M.D.   On: 07/30/2014 11:39   Ir Gastrostomy Tube Mod Sed  08/06/2014   CLINICAL DATA:  80 year old female with dysphagia and protein calorie malnutrition. She has a large hiatal hernia. Placement of a percutaneous gastrostomy tube is warranted for nutrition.  EXAM: PERC PLACEMENT GASTROSTOMY  Date: 08/06/2014  PROCEDURE: 1.  Fluoroscopically guided placement of percutaneous pull-through gastrostomy tube. Interventional Radiologist:  Criselda Peaches, MD  ANESTHESIA/SEDATION: Moderate (conscious) sedation was used. 1.5 mg Versed, 50 mcg Fentanyl were administered intravenously. The patient's vital signs were monitored continuously by radiology nursing throughout the procedure.  Sedation Time: 26 minutes  FLUOROSCOPY TIME:  7 min 24 seconds (71 mGy)  CONTRAST:  10 mL Omnipaque 3  MEDICATIONS: 1 g vancomycin was administered intravenously within 1 hour of skin incision.  TECHNIQUE: Informed consent was obtained from the patient following explanation of the procedure, risks, benefits and alternatives. The patient understands, agrees and consents for the procedure. All questions were addressed. A time out was performed.  Maximal barrier sterile technique utilized including caps, mask, sterile gowns, sterile gloves, large sterile drape, hand hygiene, and chlorhexadine skin prep.  An angled catheter was advanced over a wire under fluoroscopic guidance through the nose, down the esophagus and into the body of the stomach. The stomach was then insufflated with several 100 ml of air. Fluoroscopy confirmed location of the gastric bubble, as well as inferior displacement of the barium stained colon. Under direct fluoroscopic guidance, a single T-tack was placed, and the anterior gastric wall drawn up against the anterior abdominal wall. Percutaneous access was then obtained into the mid gastric body with an 18 gauge sheath needle. Aspiration of air, and injection of contrast material under fluoroscopy confirmed needle placement.  An Amplatz wire was advanced in the gastric body and the access needle exchanged for a 9-French vascular sheath. A snare device was advanced through the vascular sheath and an Amplatz wire advanced through the angled catheter. The Amplatz wire was successfully snared and this was pulled up through the esophagus and out  the mouth. A 20-French Alinda Dooms MIC-PEG tube was then connected to the snare and pulled through the mouth, down the esophagus, into the stomach and out to the anterior abdominal wall. Hand injection of contrast material confirmed intragastric location. The T-tack retention suture was then cut. The pull through peg tube was then secured with the external bumper and  capped.  The patient will be observed for several hours with the newly placed tube on low wall suction to evaluate for any post procedure complication. The patient tolerated the procedure well, there is no immediate complication.  IMPRESSION: Successful placement of a 20 French pull through gastrostomy tube.  Signed,  Criselda Peaches, MD  [facility]  [facility]   Electronically Signed   By: Jacqulynn Cadet M.D.   On: 08/06/2014 16:58   Dg Chest Port 1 View  07/29/2014   CLINICAL DATA:  Hypoxia.  History of breast cancer.  EXAM: PORTABLE CHEST - 1 VIEW  COMPARISON:  07/28/2014.  FINDINGS: Mediastinum hilar structures normal. Mild cardiomegaly. Diffuse severe from interstitial infiltrates are present. Small right pleural effusion. These findings could be related to congestive heart for and interstitial edema severe pneumonitis could also present in this fashion. Interstitial tumor spread could present in this fashion. No acute bony abnormality identified.  IMPRESSION: 1. Severe bilateral pulmonary interstitial infiltrates. Small right pleural effusion. These findings may be related to congestive heart failure an interstitial edema. Severe interstitial pneumonitis could present in this fashion. Interstitial tumor spread could present in this fashion. 2. Cardiomegaly.   Electronically Signed   By: Marcello Moores  Register   On: 07/29/2014 08:25   Dg Ander Gaster Tube  08/02/2014   CLINICAL DATA:  Feeding tube placement  EXAM: INTRO LONG GI TUBE  TECHNIQUE: Fluoroscopic guidance was used to in the  placement of a feeding tube.  CONTRAST:  106mL OMNIPAQUE IOHEXOL 300 MG/ML  SOLN  FLUOROSCOPY TIME:  18 min 19 seconds  COMPARISON:  08/02/2014.  FINDINGS: The patient present to the radiology department for image guided feeding tube placement. Upon initial presentation the patient had a nasogastric tube in place which was coiled in the patient's large hiatal hernia. This was removed and a guidewire was placed into the gastric lumen. The weighted feeding tube was then advanced over the guidewire into the distal esophagus. Multiple attempts were made to pass the feeding tube through the large hiatal hernia and into the stomach requiring extended fluoroscopy time. After multiple attempts the feeding tube was advanced into the stomach and subsequently post pyloric. Water-soluble contrast material was injected through the feeding tube confirming post pyloric placement.  IMPRESSION: 1. Technically successful post pyloric placement of a weighted feeding tube. 2. Large hiatal hernia.   Electronically Signed   By: Kerby Moors M.D.   On: 08/02/2014 19:07   Dg Foot Complete Right  07/19/2014   CLINICAL DATA:  Patient reports the second and third post appeared darkened and necrosis, started 1 week ago.  EXAM: RIGHT FOOT COMPLETE - 3+ VIEW  COMPARISON:  None.  FINDINGS: There is no evidence of fracture or dislocation. There is no evidence of osteomyelitis. There is soft tissue thinning in the distal second digit.  IMPRESSION: No evidence of osteomyelitis. Soft tissue thinning in the distal second digit.   Electronically Signed   By: Abelardo Diesel M.D.   On: 07/19/2014 21:26   Dg Swallowing Func-speech Pathology  07/31/2014   Macario Golds, CCC-SLP     07/31/2014  1:52 PM Objective Swallowing Evaluation: Modified Barium Swallowing Study   Patient Details  Name: TENNELLE TAFLINGER MRN: [id number] Date of Birth: Dec 05, 1924  Today's Date: 07/31/2014 Time: 1245-1315 SLP Time Calculation (min): 30 min  Past Medical History:   Past Medical History  Diagnosis Date  . Arthritis   . Polymyalgia   . Acid  reflux   . Immune deficiency disorder     autoimmune disorder - polymyalgia - on prednisone  . Breast cancer     Treated with lumpectomy and radiation  . Giardia   . Pinworms    Past Surgical History:  Past Surgical History  Procedure Laterality Date  . Breast lumpectomy    . Partial hysterectomy    . Tonsillectomy    . Tee without cardioversion N/A 07/20/2014    Procedure: TRANSESOPHAGEAL ECHOCARDIOGRAM (TEE);  Surgeon: Candee Furbish, MD;  Location: [facility] ENDOSCOPY;  Service: Cardiovascular;   Laterality: N/A;   HPI:  80 y/o female with polymyalgia on chronic prednisone (84m) and  leflunomide [facility] and diffuse bilateral infiltrates per MD  note.  Pt also with recent Eosinophilia, CHF, acid reflux,  parasitic diagnosis - giardia, pinworm, roundworms.  Order for  swallow evaluation received and daughter GBaker Janusquestioned if pt  may dysphagia.  Pt denies dysphagia but admits to "memory  problem" and occasional choking.  She does take her pills with  applesauce premorbidly - daughter JRemo Lippsvia speaker phone stated  this was only due to pt consuming a full cup of water with pills  and complaining of being full - therefore JRemo Lippsrecommended pt  take pills with applesauce.  CT Chest 11/2 showed "Evidence of  emphysema with traction bronchiectasis in multiple areas of  interstitial fibrosis. This appearance is consistent with  underlying usual interstitial pneumonitis. There are areas of  patchy consolidation in both lower lobes as well as to a lesser  extent in the upper lobes. There also moderate pleural effusions.  The areas of consolidation are felt to most likely represent  superimposed pneumonia. There may be a degree of superimposed  congestive heart failure as well given the pleural effusions. 5  mm nodular opacity in the posterior segment of the right upper  lobe.  Areas of  atherosclerotic change and cardiac valvular  calcification."     Assessment / Plan / Recommendation Clinical Impression  Dysphagia Diagnosis: Moderate oral phase dysphagia;Moderate  pharyngeal phase dysphagia;Moderate cervical esophageal phase  dysphagia  Clinical impression: Moderate oropharyngeal and suspected  cervical esophageal dysphagia with + aspiration or penetration  with each consistency tested.  Sensorimotor deficits noted  impacting swallow ability.   Premature spillage of boluses into pharynx noted d/t weakness and  compromised laryngeal elevation resulted in + aspiration of  nectar *mixing with secretions* before the swallow.  She senses  penetration/aspiration intermittently and can expectorate but was  noted to be excessively fatigued and mildly dyspenic after just a  few tsps of intake.  Further boluses were re-penetrated or  aspirated.  Moderate vallecular residuals also noted with pudding  and cracker more than liquids WITHOUT pt awareness.  Pt also is  aspirating secretions that mix with barium.     Pt is at high aspiration risk currently and recommend she be NPO  except small single ice chips after oral care.  ? If aspiration  could be contributing to pt's fevers, pneumonia.  Daughter GBaker Janus present during testing and both pt/daughter educated to findings.     Further informed GBaker Janusthat feeding tubes do not prevent  aspiration.  SLP uncertain of pt's swallow prognosis given ?  source of oropharyngeal dysphagia and pt's deconditioning and  ongoing illness x2 months per GBaker Janus Will follow up to help with  care plan for to determine readiness for repeat evaluation if  indicated.  Treatment Recommendation  Therapy as outlined in treatment plan below    Diet Recommendation NPO;Ice chips PRN after oral care (single  small ice chips after oral care pending plans)        Other  Recommendations Oral Care Recommendations: Oral care Q4  per protocol   Follow Up Recommendations    TBD   Frequency and  Duration min 1 x/week  1 week   Pertinent Vitals/Pain Febrile, congested     General Date of Onset: 07/30/14 HPI: 80 y/o female with polymyalgia on chronic prednisone (36m)  and leflunomide [facility] and diffuse bilateral infiltrates per MD  note.  Pt also with recent Eosinophilia, CHF, acid reflux,  parasitic diagnosis - giardia, pinworm, roundworms.  Order for  swallow evaluation received and daughter GBaker Janusquestioned if pt  may dysphagia.  Pt denies dysphagia but admits to "memory  problem" and occasional choking.  She does take her pills with  applesauce premorbidly - daughter JRemo Lippsvia speaker phone stated  this was only due to pt consuming a full cup of water with pills  and complaining of being full - therefore JRemo Lippsrecommended pt  take pills with applesauce.  CT Chest 11/2 showed "Evidence of  emphysema with traction bronchiectasis in multiple areas of  interstitial fibrosis. This appearance is consistent with  underlying usual interstitial pneumonitis. There are areas of  patchy consolidation in both lower lobes as well as to a lesser  extent in the upper lobes. There also moderate pleural effusions.  The areas of consolidation are felt to most likely represent  superimposed pneumonia. There may be a degree of superimposed  congestive heart failure as well given the pleural effusions. 5  mm nodular opacity in the posterior segment of the right upper  lobe.  Areas of atherosclerotic change and cardiac valvular  calcification." Type of Study: Modified Barium Swallowing Study Reason for Referral: Objectively evaluate swallowing function  (rule out overt aspiration) Diet Prior to this Study: Thin liquids Temperature Spikes Noted: Yes Respiratory Status: Room air History of Recent Intubation: No Behavior/Cognition: Alert;Cooperative;Pleasant mood Oral Cavity - Dentition: Dentures, top (lower partial) Oral Motor / Sensory Function: Impaired - see Bedside  swallow  eval Self-Feeding Abilities: Able to feed self;Needs set up Patient Positioning: Upright in chair Baseline Vocal Quality: Clear (Baker Janusreports voice will change  intermittently  - likely due to excessive coughing) Volitional Cough: Strong (productive) Volitional Swallow: Able to elicit Anatomy: Within functional limits Pharyngeal Secretions: Not observed secondary MBS    Reason for Referral Objectively evaluate swallowing function  (rule out overt aspiration)   Oral Phase Oral Preparation/Oral Phase Oral Phase: Impaired Oral - Honey Oral - Honey Teaspoon: Reduced posterior  propulsion;Lingual/palatal residue;Weak lingual manipulation Oral - Nectar Oral - Nectar Teaspoon: Reduced posterior  propulsion;Lingual/palatal residue;Weak lingual manipulation Oral - Thin Oral - Thin Teaspoon: Reduced posterior propulsion;Weak lingual  manipulation Oral - Thin Cup: Reduced posterior propulsion;Lingual/palatal  residue;Weak lingual manipulation Oral - Solids Oral - Puree: Reduced posterior propulsion;Lingual/palatal  residue;Weak lingual manipulation Oral - Regular: Reduced posterior propulsion;Weak lingual  manipulation Oral Phase - Comment Oral Phase - Comment: premature spillage of barium into pharynx  with decreased control   Pharyngeal Phase Pharyngeal Phase Pharyngeal Phase: Impaired Pharyngeal - Honey Pharyngeal - Honey Teaspoon: Premature spillage to  valleculae;Reduced anterior laryngeal mobility;Reduced laryngeal  elevation;Reduced airway/laryngeal closure;Reduced tongue base  retraction;Reduced epiglottic inversion;Reduced pharyngeal  peristalsis;Penetration/Aspiration before swallow;Pharyngeal  residue - valleculae;Pharyngeal residue -  cp segment Penetration/Aspiration details (honey teaspoon): Material enters  airway, CONTACTS cords and not ejected out Pharyngeal - Nectar Pharyngeal - Nectar Teaspoon: Premature spillage to  valleculae;Reduced anterior laryngeal mobility;Reduced epiglottic  inversion;Reduced  airway/laryngeal closure;Reduced laryngeal  elevation;Reduced tongue base retraction;Reduced pharyngeal  peristalsis;Trace aspiration;Penetration/Aspiration before  swallow;Penetration/Aspiration during swallow;Pharyngeal residue  - cp segment Penetration/Aspiration details (nectar teaspoon): Material enters  airway, passes BELOW cords without attempt by patient to eject  out (silent aspiration) Pharyngeal - Thin Pharyngeal - Thin Teaspoon: Reduced epiglottic inversion;Reduced  laryngeal elevation;Reduced airway/laryngeal closure;Reduced  anterior laryngeal mobility;Reduced pharyngeal  peristalsis;Pharyngeal residue - cp segment Pharyngeal - Thin Cup: Reduced anterior laryngeal  mobility;Reduced epiglottic inversion;Premature spillage to  pyriform sinuses;Reduced laryngeal elevation;Reduced  airway/laryngeal closure;Reduced pharyngeal peristalsis;Trace  aspiration;Penetration/Aspiration before swallow;Pharyngeal  residue - valleculae;Pharyngeal residue - cp segment Penetration/Aspiration details (thin cup): Material enters  airway, CONTACTS cords and not ejected out;Material enters  airway, passes BELOW cords without attempt by patient to eject  out (silent aspiration) Pharyngeal - Solids Pharyngeal - Puree: Reduced pharyngeal peristalsis;Reduced  airway/laryngeal closure;Reduced epiglottic inversion;Reduced  tongue base retraction;Reduced laryngeal elevation;Premature  spillage to valleculae;Penetration/Aspiration before  swallow;Pharyngeal residue - cp segment Penetration/Aspiration details (puree): Material enters airway,  remains ABOVE vocal cords then ejected out Pharyngeal - Regular: Reduced tongue base retraction;Reduced  epiglottic inversion;Reduced anterior laryngeal  mobility;Premature spillage to valleculae;Reduced laryngeal  elevation;Reduced airway/laryngeal closure;Reduced pharyngeal  peristalsis;Pharyngeal residue - valleculae;Pharyngeal residue -  cp segment Pharyngeal Phase - Comment Pharyngeal  Comment: pt without awareness to pharyngeal residuals  mixing with barium/secretions, cued dry swallows marginally  effective to decrease residuals, pt able to expectorate to clear  but caused significant fatigue for pt after just a few boluses,  chin tuck posture not effective (pt unable to fully conduct due  to neck discomfort)   Cervical Esophageal Phase    GO    Cervical Esophageal Phase Cervical Esophageal Phase: Impaired Cervical Esophageal Phase - Honey Honey Teaspoon: Reduced cricopharyngeal relaxation Cervical Esophageal Phase - Nectar Nectar Teaspoon: Reduced cricopharyngeal relaxation Cervical Esophageal Phase - Thin Thin Teaspoon: Reduced cricopharyngeal relaxation Thin Cup: Reduced cricopharyngeal relaxation Cervical Esophageal Phase - Solids Puree: Reduced cricopharyngeal relaxation Regular: Reduced cricopharyngeal relaxation Cervical Esophageal Phase - Comment Cervical Esophageal Comment: appearance of barium residuals in  mid-esophagus, cued dry swallow nor liquid swallows effective to  clear, radiologist not present to confirm and pt could not  undergo esophagram due to aspiration         Luanna Salk, [facility] SLP [phone]     Microbiology: Recent Results (from the past 240 hour(s))  Clostridium Difficile by PCR     Status: None   Collection Time: 08/09/14 11:51 PM  Result Value Ref Range Status   C difficile by pcr NEGATIVE NEGATIVE Final    Comment: Performed at [place]: Basic Metabolic Panel:  Recent Labs Lab 08/05/14 0411 08/08/14 0419 08/10/14 0850  NA 137 138 138  K 3.3* 3.2* 3.7  CL 101 102 99  CO2 _0 GLUCOSE 109* 178* 188*  BUN _1 CREATININE 0.50 0.49* 0.56  CALCIUM 8.4 8.2* 8.4   Liver Function Tests: No results for input(s): AST, ALT, ALKPHOS, BILITOT, PROT, ALBUMIN in the last 168 hours. No results for input(s): LIPASE, AMYLASE in the last 168 hours. No results for input(s): AMMONIA in the last 168 hours. CBC:  Recent  Labs Lab 08/05/14 0411 08/08/14 0419  WBC 7.2 7.3  HGB 12.4 11.8*  HCT 38.0 35.9*  MCV 89.4 89.5  PLT 164 136*   Cardiac Enzymes: No results for input(s): CKTOTAL, CKMB, CKMBINDEX, TROPONINI in the last 168 hours. BNP: BNP (last 3 results)  Recent Labs  07/19/14 2039 07/28/14 1225 07/29/14 0330  PROBNP 1467.0* 576.5* 753.4*   CBG:  Recent Labs Lab 08/09/14 2039 08/09/14 2358 08/10/14 0415 08/10/14 0810 08/10/14 1205  GLUCAP 140* 248* 115* 213* 144*       Signed:  Charlynne Cousins  [facility] 08/10/2014, 3:12 PM

## 2014-08-10 ENCOUNTER — Ambulatory Visit: Payer: Medicare Other | Admitting: Physician Assistant

## 2014-08-10 LAB — BASIC METABOLIC PANEL
Anion gap: 12 (ref 5–15)
BUN: 17 mg/dL (ref 6–23)
CO2: 27 meq/L (ref 19–32)
CREATININE: 0.56 mg/dL (ref 0.50–1.10)
Calcium: 8.4 mg/dL (ref 8.4–10.5)
Chloride: 99 mEq/L (ref 96–112)
GFR calc non Af Amer: 81 mL/min — ABNORMAL LOW (ref >90)
Glucose, Bld: 188 mg/dL — ABNORMAL HIGH (ref 70–99)
Potassium: 3.7 mEq/L (ref 3.7–5.3)
Sodium: 138 mEq/L (ref 137–147)

## 2014-08-10 LAB — GLUCOSE, CAPILLARY
GLUCOSE-CAPILLARY: 115 mg/dL — AB (ref 70–99)
Glucose-Capillary: 144 mg/dL — ABNORMAL HIGH (ref 70–99)
Glucose-Capillary: 213 mg/dL — ABNORMAL HIGH (ref 70–99)
Glucose-Capillary: 248 mg/dL — ABNORMAL HIGH (ref 70–99)

## 2014-08-10 LAB — CLOSTRIDIUM DIFFICILE BY PCR: Toxigenic C. Difficile by PCR: NEGATIVE

## 2014-08-10 MED ORDER — FREE WATER: 120.0000 mL | Freq: Every day | Status: DC

## 2014-08-10 MED ORDER — RABEPRAZOLE SODIUM 20 MG PO TBEC: 20.0000 mg | DELAYED_RELEASE_TABLET | Freq: Every day | ORAL | Status: DC

## 2014-08-10 MED ORDER — ASPIRIN 81 MG PO CHEW: 81.0000 mg | CHEWABLE_TABLET | Freq: Every day | ORAL | Status: DC

## 2014-08-10 MED ORDER — COLLAGENASE 250 UNIT/GM EX OINT
TOPICAL_OINTMENT | Freq: Every day | CUTANEOUS | Status: DC
  Administered 2014-08-10: 1 via TOPICAL
  Filled 2014-08-10: qty 30

## 2014-08-10 MED ORDER — SACCHAROMYCES BOULARDII 250 MG PO CAPS: 250.0000 mg | ORAL_CAPSULE | Freq: Two times a day (BID) | ORAL | Status: DC

## 2014-08-10 MED ORDER — JEVITY 1.2 CAL PO LIQD: 237.0000 mL | Freq: Every day | ORAL | Status: DC

## 2014-08-10 MED ORDER — BENZONATATE 200 MG PO CAPS: 200.0000 mg | ORAL_CAPSULE | Freq: Three times a day (TID) | ORAL | Status: DC | PRN

## 2014-08-10 MED ORDER — PRO-STAT SUGAR FREE PO LIQD: 30.0000 mL | Freq: Every morning | ORAL | Status: DC

## 2014-08-10 MED ORDER — FOLIC ACID 1 MG PO TABS: 1.0000 mg | ORAL_TABLET | Freq: Every day | ORAL | Status: DC

## 2014-08-10 NOTE — Progress Notes (Signed)
Pt tolerated feeds very well during this shift with no complains. Had two small dark brown loose stools the whole shift. A sample was sent down to lab this morning. Family at bedside and appreciate care this shift.

## 2014-08-10 NOTE — Progress Notes (Signed)
Pt d/c'd home accompanied by both daughters. Discharge instructions reviewed and pt/family verbalized no further questions. Pt stable from AM assessment. Hortencia Conradi RN

## 2014-08-10 NOTE — Progress Notes (Signed)
[facility] PROGRESS NOTE Interim History: 80 year old female with past medical history of polymyalgia rheumatica on steroids, severely calcified mitral valve with moderate mitral valve stenosis, chronic diastolic heart failure gangrenous toe on the right, recently discharged from the hospital on 07/24/2014 for healthcare associated pneumonia and encephalopathy. Brought back to the emergency room on 07/28/2014 for shortness of breath fever and hypoxia, chest x-ray showed worsening infiltrates with an elevated white count and a temperature 102. Patient was started on empiric antibiotic swallowing evaluation was done that showed severe oropharyngeal dysphagia now status post PEG placement with nutrition   Assessment/Plan: Sepsis: - On admission she was febrile and noted to be mildly hypotensive with an elevated lactic acid chest x-ray showed persistent infiltrates. - She was started empirically on vancomycin and cefepime, ID was consulted which recommended clindamycin.  Acute hypoxic respiratory failure due to aspiration pneumonia: - Desaturated overnight. Will go home on Oxygen.  Severe oropharyngeal dysphagia: - Family did agree with PEG tube feeding, as PEG tube was clogging, interventional radiology consulted status post PEG placement on 08/06/2014. - Pt had severe diarrhea, formula changed, BM improved. - c. Dif pending.  History of Giardia, Ascaris lumbricoides: - ID and pulmonary did not recommend treating with anti-film intake therapy. - Bronchoscopy was not recommended, as very low suspicion for parasitic infections. An morning concern about aspiration pneumonia. - We'll and parasite continues to be negative, strange abilities antibody negative. Serum quantity of urine negative.  Chronic diastolic heart failure: - Euvolemic BNP of 576. - resume home dose of Lasix.  Polymyalgia rheumatica: - Continue prednisone home dose.  Gangrenous right foot toe: - Unclear  etiology, pulses on the right popliteal perfusion. - Follow-up with vascular as an outpatient  Decubitus ulcer: - Wound care consult.  Protein caloric normal nutrition - status post PEG tube on 08/06/2014 by interventional radiologist.   Code Status: Full Code.  Family Communication: Care discussed with Daughter at bedside Disposition Plan: transition meds to PO; hopefully home in today.   Consultants:  Pulmonary  Infectious disease  Interventional radiologist  Procedures:  PEG tube  Antibiotics:  Vancomycin from 10:30 -11 2  Cefepime 10/31-11/03  Clindamycin 7 days.  HPI/Subjective: Family offended and requested another physician, as we spoke as brought about [place].  Objective: Filed Vitals:   08/08/14 2103 08/09/14 0457 08/09/14 1317 08/10/14 0420  BP: 109/58 127/66 116/64 117/51  Pulse: 101 108 95 98  Temp: 97.4 F (36.3 C) 97.4 F (36.3 C) 98.7 F (37.1 C) 97.7 F (36.5 C)  TempSrc: Oral Oral Oral Oral  Resp: 16 14 20 18   Height:      Weight:  59.603 kg (131 lb 6.4 oz)  59.421 kg (131 lb)  SpO2: 95% 93% 95% 96%    Intake/Output Summary (Last 24 hours) at 08/10/14 0831 Last data filed at 08/10/14 0647  Gross per 24 hour  Intake   1577 ml  Output    600 ml  Net    977 ml   Filed Weights   08/08/14 0418 08/09/14 0457 08/10/14 0420  Weight: 60.5 kg (133 lb 6.1 oz) 59.603 kg (131 lb 6.4 oz) 59.421 kg (131 lb)    Exam:  General: Alert, awake, oriented x3, in no acute distress.  HEENT: No bruits, no goiter.  Heart: Regular rate and rhythm. Lungs: Good air movement,clear Abdomen: Soft, nontender, nondistended, positive bowel sounds.  Neuro: Grossly intact, nonfocal.   Data Reviewed: Basic Metabolic Panel:  Recent Labs Lab 08/05/14 0411 08/08/14 0419  NA 137 138  K 3.3* 3.2*  CL 101 102  CO2 22 25  GLUCOSE 109* 178*  BUN 18 14  CREATININE 0.50 0.49*  CALCIUM 8.4 8.2*   Liver Function Tests: No results for input(s): AST, ALT,  ALKPHOS, BILITOT, PROT, ALBUMIN in the last 168 hours. No results for input(s): LIPASE, AMYLASE in the last 168 hours. No results for input(s): AMMONIA in the last 168 hours. CBC:  Recent Labs Lab 08/05/14 0411 08/08/14 0419  WBC 7.2 7.3  HGB 12.4 11.8*  HCT 38.0 35.9*  MCV 89.4 89.5  PLT 164 136*   Cardiac Enzymes: No results for input(s): CKTOTAL, CKMB, CKMBINDEX, TROPONINI in the last 168 hours. BNP (last 3 results)  Recent Labs  07/19/14 2039 07/28/14 1225 07/29/14 0330  PROBNP 1467.0* 576.5* 753.4*   CBG:  Recent Labs Lab 08/09/14 1700 08/09/14 2039 08/09/14 2358 08/10/14 0415 08/10/14 0810  GLUCAP 210* 140* 248* 115* 213*    No results found for this or any previous visit (from the past 240 hour(s)).   Studies: No results found.  Scheduled Meds: . antiseptic oral rinse  7 mL Mouth Rinse q12n4p  . aspirin  81 mg Oral Daily  . chlorhexidine  15 mL Mouth Rinse BID  . enoxaparin (LOVENOX) injection  40 mg Subcutaneous Q24H  . feeding supplement (JEVITY 1.2 CAL)  237 mL Per Tube 5 X Daily  . feeding supplement (PRO-STAT SUGAR FREE 64)  30 mL Per Tube q morning - 34L  . folic acid  1 mg Intravenous Daily  . free water  120 mL Per Tube QID  . furosemide  20 mg Per Tube Daily  . metoprolol tartrate  6.25 mg Per Tube BID  . pantoprazole sodium  40 mg Per Tube BID  . polyvinyl alcohol  1 drop Both Eyes Daily  . potassium chloride  20 mEq Per Tube Daily  . prednisoLONE  5.1 mg Per Tube QAC breakfast  . sodium chloride  3 mL Intravenous Q12H   Continuous Infusions:     Charlynne Cousins  [facility] Pager [phone]. If 8PM-8AM, please contact night-coverage at [email or web address], password [facility] 08/10/2014, 8:31 AM  LOS: 13 days

## 2014-08-10 NOTE — Plan of Care (Signed)
Problem: Discharge Progression Outcomes Goal: Tolerating diet Outcome: Completed/Met Date Met:  08/10/14 Tolerating tube feeds as ordered.

## 2014-08-10 NOTE — Consult Note (Signed)
WOC wound consult note Reason for Consult: Unstageable PrU to coccyx (previously noted as sacral, but this may have been due to positioning. Ulcer is the same as previously noted on 07/19/14 and is on coccyx).  Patient's daughter is reporting that her daughter who is a critical care nurse has recommended xeroform gauze as a topical therapy treatment. Today I have explained that the enzymatic debriding agent is superior to the xeroform in her mother's case, as there is necrotic tissue in the wound bed and xeroform is not a debriding agent. Wound type:Pressure Pressure Ulcer POA: Yes Measurement:2cm x 1cm with depth unable to be determined due to the presence of necrotic tissue Wound bed:50% obscured by yellow slough, 50% pink Drainage (amount, consistency, odor) scant serous Periwound:intact Dressing procedure/placement/frequency: I will reorder enzymatic debriding agent (Santyl/collagenase) daily x 21 days.  Patient situation is confounded by the fact that her [facility] requires elevation much of the time due to tube feeding and medication; bedside staff instructed to lower HOB as able to below 30 degrees.  As patient does not currently have her pressure redistribution chair pad provided at last admission, I will provide another.  Patient has other metabolic challenges which makes healing of a pressure ulcer difficult.  Frequent and regular turning and repositioning, maximization of nutrition and providing timely incontinence care are all in place. [place] nursing team will not follow, but will remain available to this patient, the nursing and medical teams.  Please re-consult if needed. Thanks, Maudie Flakes, MSN, RN, [place], [place], [place] [phone])

## 2014-08-10 NOTE — Progress Notes (Addendum)
NUTRITION FOLLOW UP  Details for home Feedings: Feedings per G-tube: TF:  Jevity 1.2 5 cans daily (1 can at 6 am, 10 am, 2 pm, 6 pm, 10 pm) Prostat 30 ml bid (At home this will be Promod) Free water:  60 ml before and after each bolus feeding.  Patient will need an additional 120 ml per day which will be met with med flushes and po ice chips.  Above to provide:  1625 kcal, 86 gm protein, 1675 ml free water. Estimated needs: 1400-1600 kcal, 80-90 gm protein, 1600-1700 ml free water daily.  Patient is tolerating feeding well with decreased volume, frequency and improved consistency of stools. Patient to D/C home today.  Diet NPO except for ice chips.    Left message with [facility] Liaison and RD  about formula changes and amounts.  Spoke with daughter [name] and educated her on the TF schedule, formula and amounts.  TF for home book provided.  Details about current feeding regime written in daughter's notebook.  Daughter with no further questions.  Antonieta Iba, RD, LDN Clinical Inpatient Dietitian Pager:  [phone] Weekend and after hours pager:  [phone]

## 2014-08-10 NOTE — Progress Notes (Signed)
Cleased necrotic toes on rt foot with betadine and weaved gauze between toes, per order. Also cleansed stage II ulcer on coccyx and applied santyl ointment, damp saline 2x2, and new allevyn dressing, per order of [place]. Provided education to daughter Arville Go on how to administer tube feeds and meds via PEG. She was able to perform 2pm tube feed with min cueing by RN. Hortencia Conradi RN

## 2014-08-13 ENCOUNTER — Other Ambulatory Visit: Payer: Self-pay | Admitting: *Deleted

## 2014-08-13 MED ORDER — METOPROLOL TARTRATE 25 MG/10 ML ORAL SUSPENSION: 6.2500 mg | Freq: Two times a day (BID) | ORAL | Status: DC

## 2014-08-14 ENCOUNTER — Ambulatory Visit: Payer: Medicare Other | Admitting: *Deleted

## 2014-08-14 ENCOUNTER — Ambulatory Visit (INDEPENDENT_AMBULATORY_CARE_PROVIDER_SITE_OTHER): Payer: Medicare Other | Admitting: Pulmonary Disease

## 2014-08-14 ENCOUNTER — Encounter: Payer: Self-pay | Admitting: Pulmonary Disease

## 2014-08-14 VITALS — BP 84/58 | HR 101 | Ht 61.0 in | Wt 141.0 lb

## 2014-08-14 DIAGNOSIS — Z23 Encounter for immunization: Secondary | ICD-10-CM

## 2014-08-14 DIAGNOSIS — I5033 Acute on chronic diastolic (congestive) heart failure: Secondary | ICD-10-CM

## 2014-08-14 DIAGNOSIS — J84112 Idiopathic pulmonary fibrosis: Secondary | ICD-10-CM

## 2014-08-14 NOTE — Progress Notes (Signed)
   Subjective:    Patient ID: Martha Mcbride, female    DOB: 1925-03-28, 80 y.o.   MRN: [id number]  HPI  80 y.o. female for follow-up of interstitial lung disease presumed IPF PMH significant for Polymyalgia on prednisone, severely calcified mitral valve with moderate stenosis, chronic diastolic heart failure,Gangrenous toes on the right foot    Significant tests/ events   admission 10-27/15 -health care associated PNA, encephalopathy, and diastolic HF exacerbation.   Admitted 07/28/14 with fever 103, confusion,productive cough. WBC at 12, Lactic acid at 3.5. Chest x ray -diffuse bilateral pulmonary interstitial infiltrates.& eosinophilia (AEC 1800 on 10/22 which came down to 600 on 10/31). Diarrhea is resolved & felt unlikely to be parasitic by ID - stool O & P neg.   07/30/14  CT: Evidence of emphysema with traction bronchiectasis in multiple areas of interstitial fibrosis. This appearance is consistent with underlying usual interstitial pneumonitis. There are areas of patchy consolidation in both lower lobes as wellas to a lesser extent in the upper lobes. There also moderate pleural effusions. The areas of consolidation are felt to most likely represent superimposed pneumonia. There may be a degree of superimposed congestive heart failure as well given the pleural effusions.5 mm nodular opacity in the posterior segment of the right upper lobe. Areas of atherosclerotic change and cardiac valvular calcification. Areas of relatively mild adenopathy. Cholelithiasis.  04/2012 CT abd >>large hiatal hernia . Prominent interstitial lung markings suggest chronic changes  06/2014 TEE mild MS, mod AS, nml LVfn Ova and parasite stool neg.  Strongyloids antibody pending.  Quantiferon Tb neg   Chief Complaint  Patient presents with  . Hospitalization Follow-up    went to hospital for chest discomfort, fever.  was in hospital for 3 weeks for pneumonia (hosipital aquired), difficulty breathing since  going home from hospital, extremely weak.    swallowing evaluation was done that showed severe oropharyngeal dysphagia now status post PEG placement with nutrition her dysphagia & memory issues seem to be more important at this time  Accompanied by daughterJoanne ,she is in a wheelchair.has lost significant weight since admission Breathing is much improved, is undergoing home physical therapy Immunization up-to-date She was discharged on 15 mg of prednisone, is running short on Lasix Is able to use oxygen at night  Review of Systems neg for any significant sore throat, dysphagia, itching, sneezing, nasal congestion or excess/ purulent secretions, fever, chills, sweats, unintended wt loss, pleuritic or exertional cp, hempoptysis, orthopnea pnd or change in chronic leg swelling. Also denies presyncope, palpitations, heartburn, abdominal pain, nausea, vomiting, diarrhea or change in bowel or urinary habits, dysuria,hematuria, rash, arthralgias, visual complaints, headache, numbness weakness or ataxia.     Objective:   Physical Exam  Gen. Pleasant, well-nourished, in no distress ENT - no lesions, no post nasal drip Neck: No JVD, no thyromegaly, no carotid bruits Lungs: no use of accessory muscles, no dullness to percussion, clear without rales or rhonchi  Cardiovascular: Rhythm regular, heart sounds  normal, no murmurs or gallops, no peripheral edema Musculoskeletal: No deformities, no cyanosis or clubbing        Assessment & Plan:

## 2014-08-14 NOTE — Patient Instructions (Signed)
You have pulmonary fibrosis Flu shot Refill on lasix 20 mg (solution ) daily Drop prednisone to 10 mg daily x 2 weeks , then 5 mg daily Stay on oxygen at night CXR next visit

## 2014-08-14 NOTE — Assessment & Plan Note (Signed)
Flu shot Refill on lasix 20 mg (solution ) daily Drop prednisone to 10 mg daily x 2 weeks , then 5 mg daily Stay on oxygen at night CXR next visit Not a candidate for anti fibrotic therapies

## 2014-08-15 ENCOUNTER — Other Ambulatory Visit: Payer: Self-pay | Admitting: *Deleted

## 2014-08-15 MED ORDER — METOPROLOL TARTRATE 25 MG/10 ML ORAL SUSPENSION: 6.2500 mg | Freq: Two times a day (BID) | ORAL | Status: DC

## 2014-08-15 NOTE — Telephone Encounter (Signed)
Rx refill sent to patient pharmacy   

## 2014-08-15 NOTE — Assessment & Plan Note (Signed)
Refill on lasix 20 mg (solution ) daily

## 2014-08-16 ENCOUNTER — Telehealth: Payer: Self-pay | Admitting: Pulmonary Disease

## 2014-08-16 NOTE — Telephone Encounter (Signed)
lmomtcb x1 

## 2014-08-17 NOTE — Telephone Encounter (Signed)
lmomtcb x 2  

## 2014-08-20 NOTE — Telephone Encounter (Signed)
lmomtcb x 3  

## 2014-08-20 NOTE — Telephone Encounter (Signed)
Pt's daughter returned call & states that PCP took care of pt's Lasix Rx.  Pt's daughter states nothing further needed at this time.  Martha Mcbride

## 2014-08-22 ENCOUNTER — Other Ambulatory Visit: Payer: Self-pay | Admitting: *Deleted

## 2014-08-22 ENCOUNTER — Other Ambulatory Visit: Payer: Self-pay | Admitting: Cardiology

## 2014-08-22 MED ORDER — METOPROLOL TARTRATE 25 MG/10 ML ORAL SUSPENSION: 6.2500 mg | Freq: Two times a day (BID) | ORAL | Status: DC

## 2014-08-22 NOTE — Telephone Encounter (Signed)
[name] is calling in to see if the pt can get a prescription for Metoprolol in suspension form now that the pt has a feeding tube. It needs to be called in to [facility]

## 2014-08-22 NOTE — Telephone Encounter (Signed)
Pt need a new prescription for Metoprolol compounded. Please call to [facility].(she did not know the phone number)

## 2014-08-24 ENCOUNTER — Emergency Department (HOSPITAL_COMMUNITY): Payer: Medicare Other

## 2014-08-24 ENCOUNTER — Encounter (HOSPITAL_COMMUNITY): Payer: Self-pay | Admitting: Emergency Medicine

## 2014-08-24 ENCOUNTER — Telehealth: Payer: Self-pay | Admitting: Cardiology

## 2014-08-24 ENCOUNTER — Inpatient Hospital Stay (HOSPITAL_COMMUNITY)
Admission: EM | Admit: 2014-08-24 | Discharge: 2014-09-05 | DRG: 871 | Disposition: A | Discharge status: Home / Self Care | Payer: Medicare Other | Attending: Pulmonary Disease | Admitting: Pulmonary Disease

## 2014-08-24 DIAGNOSIS — R6520 Severe sepsis without septic shock: Secondary | ICD-10-CM | POA: Diagnosis present

## 2014-08-24 DIAGNOSIS — I96 Gangrene, not elsewhere classified: Secondary | ICD-10-CM | POA: Diagnosis present

## 2014-08-24 DIAGNOSIS — R5081 Fever presenting with conditions classified elsewhere: Secondary | ICD-10-CM | POA: Insufficient documentation

## 2014-08-24 DIAGNOSIS — J690 Pneumonitis due to inhalation of food and vomit: Secondary | ICD-10-CM | POA: Diagnosis present

## 2014-08-24 DIAGNOSIS — Z7952 Long term (current) use of systemic steroids: Secondary | ICD-10-CM

## 2014-08-24 DIAGNOSIS — E46 Unspecified protein-calorie malnutrition: Secondary | ICD-10-CM | POA: Diagnosis present

## 2014-08-24 DIAGNOSIS — I5032 Chronic diastolic (congestive) heart failure: Secondary | ICD-10-CM | POA: Diagnosis present

## 2014-08-24 DIAGNOSIS — J189 Pneumonia, unspecified organism: Secondary | ICD-10-CM | POA: Diagnosis present

## 2014-08-24 DIAGNOSIS — E440 Moderate protein-calorie malnutrition: Secondary | ICD-10-CM | POA: Diagnosis present

## 2014-08-24 DIAGNOSIS — A419 Sepsis, unspecified organism: Principal | ICD-10-CM | POA: Diagnosis present

## 2014-08-24 DIAGNOSIS — Z6826 Body mass index (BMI) 26.0-26.9, adult: Secondary | ICD-10-CM | POA: Diagnosis not present

## 2014-08-24 DIAGNOSIS — Z7982 Long term (current) use of aspirin: Secondary | ICD-10-CM

## 2014-08-24 DIAGNOSIS — K219 Gastro-esophageal reflux disease without esophagitis: Secondary | ICD-10-CM | POA: Diagnosis present

## 2014-08-24 DIAGNOSIS — Y95 Nosocomial condition: Secondary | ICD-10-CM | POA: Diagnosis present

## 2014-08-24 DIAGNOSIS — L89323 Pressure ulcer of left buttock, stage 3: Secondary | ICD-10-CM | POA: Diagnosis present

## 2014-08-24 DIAGNOSIS — R918 Other nonspecific abnormal finding of lung field: Secondary | ICD-10-CM

## 2014-08-24 DIAGNOSIS — R1310 Dysphagia, unspecified: Secondary | ICD-10-CM | POA: Diagnosis present

## 2014-08-24 DIAGNOSIS — T17908A Unspecified foreign body in respiratory tract, part unspecified causing other injury, initial encounter: Secondary | ICD-10-CM | POA: Insufficient documentation

## 2014-08-24 DIAGNOSIS — M353 Polymyalgia rheumatica: Secondary | ICD-10-CM | POA: Diagnosis present

## 2014-08-24 DIAGNOSIS — J849 Interstitial pulmonary disease, unspecified: Secondary | ICD-10-CM | POA: Diagnosis present

## 2014-08-24 DIAGNOSIS — L89152 Pressure ulcer of sacral region, stage 2: Secondary | ICD-10-CM | POA: Diagnosis present

## 2014-08-24 DIAGNOSIS — K449 Diaphragmatic hernia without obstruction or gangrene: Secondary | ICD-10-CM | POA: Diagnosis present

## 2014-08-24 DIAGNOSIS — Z931 Gastrostomy status: Secondary | ICD-10-CM

## 2014-08-24 DIAGNOSIS — R509 Fever, unspecified: Secondary | ICD-10-CM | POA: Diagnosis not present

## 2014-08-24 DIAGNOSIS — F0390 Unspecified dementia without behavioral disturbance: Secondary | ICD-10-CM | POA: Diagnosis present

## 2014-08-24 DIAGNOSIS — W449XXA Unspecified foreign body entering into or through a natural orifice, initial encounter: Secondary | ICD-10-CM | POA: Insufficient documentation

## 2014-08-24 HISTORY — DX: Cardiac arrhythmia, unspecified: I49.9

## 2014-08-24 HISTORY — DX: Unspecified dementia, unspecified severity, without behavioral disturbance, psychotic disturbance, mood disturbance, and anxiety: F03.90

## 2014-08-24 HISTORY — DX: Personal history of other diseases of the digestive system: Z87.19

## 2014-08-24 HISTORY — DX: Pneumonia, unspecified organism: J18.9

## 2014-08-24 HISTORY — DX: Heart failure, unspecified: I50.9

## 2014-08-24 LAB — BLOOD GAS, ARTERIAL
Acid-Base Excess: 0.6 mmol/L (ref 0.0–2.0)
Bicarbonate: 22.4 mEq/L (ref 20.0–24.0)
Drawn by: 331471
O2 Content: 2 L/min
O2 Saturation: 96.1 %
PCO2 ART: 31.5 mmHg — AB (ref 35.0–45.0)
PH ART: 7.478 — AB (ref 7.350–7.450)
PO2 ART: 90.3 mmHg (ref 80.0–100.0)
Patient temperature: 103.2
TCO2: 20.1 mmol/L (ref 0–100)

## 2014-08-24 LAB — COMPREHENSIVE METABOLIC PANEL
ALK PHOS: 109 U/L (ref 39–117)
ALT: 18 U/L (ref 0–35)
ANION GAP: 18 — AB (ref 5–15)
AST: 25 U/L (ref 0–37)
Albumin: 2.2 g/dL — ABNORMAL LOW (ref 3.5–5.2)
BUN: 20 mg/dL (ref 6–23)
CALCIUM: 8.3 mg/dL — AB (ref 8.4–10.5)
CO2: 21 mEq/L (ref 19–32)
Chloride: 97 mEq/L (ref 96–112)
Creatinine, Ser: 0.6 mg/dL (ref 0.50–1.10)
GFR calc Af Amer: >90 mL/min (ref >90)
GFR calc non Af Amer: 80 mL/min — ABNORMAL LOW (ref >90)
Glucose, Bld: 176 mg/dL — ABNORMAL HIGH (ref 70–99)
Potassium: 5.2 mEq/L (ref 3.7–5.3)
Sodium: 136 mEq/L — ABNORMAL LOW (ref 137–147)
TOTAL PROTEIN: 6.8 g/dL (ref 6.0–8.3)
Total Bilirubin: 0.8 mg/dL (ref 0.3–1.2)

## 2014-08-24 LAB — CBC WITH DIFFERENTIAL/PLATELET
BASOS ABS: 0.1 10*3/uL (ref 0.0–0.1)
Basophils Relative: 1 % (ref 0–1)
EOS ABS: 0.2 10*3/uL (ref 0.0–0.7)
Eosinophils Relative: 2 % (ref 0–5)
HEMATOCRIT: 34.6 % — AB (ref 36.0–46.0)
Hemoglobin: 11.4 g/dL — ABNORMAL LOW (ref 12.0–15.0)
Lymphocytes Relative: 14 % (ref 12–46)
Lymphs Abs: 1.4 10*3/uL (ref 0.7–4.0)
MCH: 29.8 pg (ref 26.0–34.0)
MCHC: 32.9 g/dL (ref 30.0–36.0)
MCV: 90.6 fL (ref 78.0–100.0)
Monocytes Absolute: 1.1 10*3/uL — ABNORMAL HIGH (ref 0.1–1.0)
Monocytes Relative: 11 % (ref 3–12)
NEUTROS ABS: 7.4 10*3/uL (ref 1.7–7.7)
NEUTROS PCT: 72 % (ref 43–77)
Platelets: 165 10*3/uL (ref 150–400)
RBC: 3.82 MIL/uL — ABNORMAL LOW (ref 3.87–5.11)
RDW: 20.4 % — AB (ref 11.5–15.5)
WBC: 10.2 10*3/uL (ref 4.0–10.5)

## 2014-08-24 LAB — URINALYSIS, ROUTINE W REFLEX MICROSCOPIC
Bilirubin Urine: NEGATIVE
Glucose, UA: NEGATIVE mg/dL
Hgb urine dipstick: NEGATIVE
Ketones, ur: NEGATIVE mg/dL
LEUKOCYTES UA: NEGATIVE
Nitrite: NEGATIVE
Protein, ur: NEGATIVE mg/dL
SPECIFIC GRAVITY, URINE: 1.016 (ref 1.005–1.030)
Urobilinogen, UA: 1 mg/dL (ref 0.0–1.0)
pH: 6 (ref 5.0–8.0)

## 2014-08-24 LAB — CBC
HEMATOCRIT: 30.9 % — AB (ref 36.0–46.0)
Hemoglobin: 10.3 g/dL — ABNORMAL LOW (ref 12.0–15.0)
MCH: 30.4 pg (ref 26.0–34.0)
MCHC: 33.3 g/dL (ref 30.0–36.0)
MCV: 91.2 fL (ref 78.0–100.0)
Platelets: 150 10*3/uL (ref 150–400)
RBC: 3.39 MIL/uL — ABNORMAL LOW (ref 3.87–5.11)
RDW: 20.5 % — AB (ref 11.5–15.5)
WBC: 7.9 10*3/uL (ref 4.0–10.5)

## 2014-08-24 LAB — SEDIMENTATION RATE: Sed Rate: 45 mm/hr — ABNORMAL HIGH (ref 0–22)

## 2014-08-24 LAB — I-STAT CG4 LACTIC ACID, ED
Lactic Acid, Venous: 6.02 mmol/L — ABNORMAL HIGH (ref 0.5–2.2)
Lactic Acid, Venous: 3.41 mmol/L — ABNORMAL HIGH (ref 0.5–2.2)

## 2014-08-24 LAB — RAPID STREP SCREEN (MED CTR MEBANE ONLY): Streptococcus, Group A Screen (Direct): NEGATIVE

## 2014-08-24 LAB — MRSA PCR SCREENING: MRSA BY PCR: NEGATIVE

## 2014-08-24 MED ORDER — SODIUM CHLORIDE 0.9 % IV SOLN: 250.0000 mL | INTRAVENOUS | Status: DC | PRN

## 2014-08-24 MED ORDER — HEPARIN SODIUM (PORCINE) 5000 UNIT/ML IJ SOLN
5000.0000 [IU] | Freq: Three times a day (TID) | INTRAMUSCULAR | Status: DC
  Administered 2014-08-24 – 2014-09-03 (×29): 5000 [IU] via SUBCUTANEOUS
  Filled 2014-08-24 (×32): qty 1

## 2014-08-24 MED ORDER — VANCOMYCIN HCL IN DEXTROSE 1-5 GM/200ML-% IV SOLN
1000.0000 mg | Freq: Once | INTRAVENOUS | Status: AC
  Administered 2014-08-24: 1000 mg via INTRAVENOUS
  Filled 2014-08-24: qty 200

## 2014-08-24 MED ORDER — ASPIRIN 81 MG PO CHEW
81.0000 mg | CHEWABLE_TABLET | Freq: Every day | ORAL | Status: DC
  Administered 2014-08-24 – 2014-09-05 (×13): 81 mg
  Filled 2014-08-24 (×14): qty 1

## 2014-08-24 MED ORDER — DEXTROSE-NACL 5-0.9 % IV SOLN
INTRAVENOUS | Status: DC
  Administered 2014-08-24 – 2014-08-27 (×6): via INTRAVENOUS

## 2014-08-24 MED ORDER — HYDROCORTISONE NA SUCCINATE PF 100 MG IJ SOLR
100.0000 mg | Freq: Once | INTRAMUSCULAR | Status: AC
  Administered 2014-08-24: 100 mg via INTRAVENOUS
  Filled 2014-08-24: qty 2

## 2014-08-24 MED ORDER — DEXTROSE 5 % IV SOLN
1.0000 g | Freq: Three times a day (TID) | INTRAVENOUS | Status: DC
  Administered 2014-08-24 – 2014-08-28 (×11): 1 g via INTRAVENOUS
  Filled 2014-08-24 (×11): qty 1

## 2014-08-24 MED ORDER — SODIUM CHLORIDE 0.9 % IV BOLUS (SEPSIS)
1000.0000 mL | INTRAVENOUS | Status: AC
  Administered 2014-08-24 (×2): 1000 mL via INTRAVENOUS

## 2014-08-24 MED ORDER — JEVITY 1.2 CAL PO LIQD
1000.0000 mL | ORAL | Status: DC
  Administered 2014-08-24: 1000 mL

## 2014-08-24 MED ORDER — ONDANSETRON HCL 4 MG/2ML IJ SOLN
4.0000 mg | Freq: Four times a day (QID) | INTRAMUSCULAR | Status: DC | PRN
  Administered 2014-08-28 – 2014-09-03 (×2): 4 mg via INTRAVENOUS
  Filled 2014-08-24 (×2): qty 2

## 2014-08-24 MED ORDER — PANTOPRAZOLE SODIUM 40 MG PO PACK
40.0000 mg | PACK | Freq: Two times a day (BID) | ORAL | Status: DC
  Administered 2014-08-24 – 2014-08-25 (×2): 40 mg
  Filled 2014-08-24 (×3): qty 20

## 2014-08-24 MED ORDER — ACETAMINOPHEN 160 MG/5ML PO SOLN
650.0000 mg | Freq: Once | ORAL | Status: AC
  Administered 2014-08-24: 650 mg
  Filled 2014-08-24: qty 20.3

## 2014-08-24 MED ORDER — FREE WATER
120.0000 mL | Freq: Four times a day (QID) | Status: DC
  Administered 2014-08-24 – 2014-09-05 (×45): 120 mL

## 2014-08-24 MED ORDER — AZTREONAM 2 G IJ SOLR
2.0000 g | Freq: Once | INTRAMUSCULAR | Status: AC
  Administered 2014-08-24: 2 g via INTRAVENOUS
  Filled 2014-08-24: qty 2

## 2014-08-24 MED ORDER — ACETAMINOPHEN 325 MG PO TABS
650.0000 mg | ORAL_TABLET | ORAL | Status: DC | PRN
  Administered 2014-08-31 – 2014-09-03 (×5): 650 mg via ORAL
  Filled 2014-08-24 (×7): qty 2

## 2014-08-24 MED ORDER — LEVOFLOXACIN IN D5W 750 MG/150ML IV SOLN
750.0000 mg | INTRAVENOUS | Status: DC
  Administered 2014-08-24: 750 mg via INTRAVENOUS
  Filled 2014-08-24: qty 150

## 2014-08-24 MED ORDER — VANCOMYCIN HCL 500 MG IV SOLR
500.0000 mg | Freq: Two times a day (BID) | INTRAVENOUS | Status: DC
  Administered 2014-08-24 – 2014-08-26 (×4): 500 mg via INTRAVENOUS
  Filled 2014-08-24 (×4): qty 500

## 2014-08-24 MED ORDER — HYDROCORTISONE NA SUCCINATE PF 100 MG IJ SOLR
50.0000 mg | Freq: Three times a day (TID) | INTRAMUSCULAR | Status: DC
  Administered 2014-08-24 – 2014-08-25 (×2): 50 mg via INTRAVENOUS
  Filled 2014-08-24 (×2): qty 2

## 2014-08-24 NOTE — ED Provider Notes (Signed)
CSN: [id number]     Arrival date & time 08/24/14  1455 History   First MD Initiated Contact with Patient 08/24/14 1513     Chief Complaint  Patient presents with  . Altered Mental Status  . Shortness of Breath     Patient is a 80 y.o. female presenting with altered mental status and shortness of breath. History provided by: daughters.  Altered Mental Status Shortness of Breath Martha Mcbride presents for fever and weakness for the last two days.  She was discharged from the hospital two weeks ago after being admitted for HCAP.  Family reports fever to 100.4 for two days and pt became much sicker starting today.  They report she has had sore throat since yesterday and facial rash since yesterday.  Pt has been more SOB lately.  Sxs are severe, constant, and worsening.    Past Medical History  Diagnosis Date  . Arthritis   . Polymyalgia   . Acid reflux   . Immune deficiency disorder     autoimmune disorder - polymyalgia - on prednisone  . Breast cancer     Treated with lumpectomy and radiation  . Giardia   . Pinworms    Past Surgical History  Procedure Laterality Date  . Breast lumpectomy    . Partial hysterectomy    . Tonsillectomy    . Tee without cardioversion N/A 07/20/2014    Procedure: TRANSESOPHAGEAL ECHOCARDIOGRAM (TEE);  Surgeon: Candee Furbish, MD;  Location: [facility] ENDOSCOPY;  Service: Cardiovascular;  Laterality: N/A;   Family History  Problem Relation Age of Onset  . Stroke Mother    History  Substance Use Topics  . Smoking status: Never Smoker   . Smokeless tobacco: Not on file  . Alcohol Use: No   OB History    No data available     Review of Systems  Respiratory: Positive for shortness of breath.   All other systems reviewed and are negative.     Allergies  Doxycycline; Other; Penicillins; and Sulfa antibiotics  Home Medications   Prior to Admission medications   Medication Sig Start Date End Date Taking? Authorizing Provider  aspirin 81 MG chewable  tablet Place 1 tablet (81 mg total) into feeding tube daily. 08/10/14  Yes Charlynne Cousins, MD  folic acid (FOLVITE) 1 MG tablet Place 1 tablet (1 mg total) into feeding tube daily. 08/10/14  Yes Charlynne Cousins, MD  furosemide (LASIX) 8 MG/ML solution Place 2.5 mLs (20 mg total) into feeding tube daily. 08/09/14  Yes Charlynne Cousins, MD  hydroxypropyl methylcellulose / hypromellose (ISOPTO TEARS / GONIOVISC) 2.5 % ophthalmic solution Place 1 drop into both eyes every morning.   Yes Historical Provider, MD  metoprolol tartrate (LOPRESSOR) 25 mg/10 mL SUSP Place 2.5 mLs (6.25 mg total) into feeding tube 2 (two) times daily. 08/22/14  Yes Lelon Perla, MD  Nutritional Supplements (FEEDING SUPPLEMENT, JEVITY 1.2 CAL,) LIQD Place 237 mLs into feeding tube 5 (five) times daily. 08/10/14  Yes Charlynne Cousins, MD  benzonatate (TESSALON) 200 MG capsule Place 1 capsule (200 mg total) into feeding tube 3 (three) times daily as needed for cough. 08/10/14   Charlynne Cousins, MD  pantoprazole sodium (PROTONIX) 40 mg/20 mL PACK Place 20 mLs (40 mg total) into feeding tube 2 (two) times daily. Patient not taking: Reported on 08/24/2014 08/09/14   Charlynne Cousins, MD  potassium chloride 20 MEQ/15ML (10%) SOLN Place 15 mLs (20 mEq total) into feeding tube daily. 08/09/14  Charlynne Cousins, MD  prednisoLONE (PRELONE) 15 MG/5ML SOLN Place 1.7 mLs (5.1 mg total) into feeding tube daily before breakfast. Patient taking differently: Place 1.4 mg into feeding tube daily before breakfast.  08/09/14   Charlynne Cousins, MD  RABEprazole (ACIPHEX) 20 MG tablet Take 1 tablet (20 mg total) by mouth daily. 08/10/14   Charlynne Cousins, MD  saccharomyces boulardii (FLORASTOR) 250 MG capsule Take 1 capsule (250 mg total) by mouth 2 (two) times daily. 08/10/14   Charlynne Cousins, MD  Water For Irrigation, Sterile (FREE WATER) SOLN Place 120 mLs into feeding tube 4 (four) times daily. 08/09/14    Charlynne Cousins, MD   BP 108/68 mmHg  Pulse 52  Temp(Src) 103.2 F (39.6 C) (Rectal)  Resp 25  SpO2 95% Physical Exam  Constitutional:  Ill appearing  HENT:  Dry mucous membranes.  Erythema of tongue and posterior OP  Eyes: Pupils are equal, round, and reactive to light.  Cardiovascular:  Tachycardic.  SEM that radiates into the left axilla  Pulmonary/Chest:  tachypneic with decreased air movement bilaterally  Abdominal: Soft. There is no tenderness.  PEG in LUQ  Musculoskeletal:  No extremity tenderness. Trace pitting edema of all four extremities.  Dry gangrenous changes to right second and third toes.  Neurological: She is alert.  Disoriented to time.  Profound generalized weakness.    Skin: Skin is warm and dry.  Psychiatric: She has a normal mood and affect. Her behavior is normal.  Nursing note and vitals reviewed.   ED Course  Procedures (including critical care time) CRITICAL CARE Performed by: Quintella Reichert   Total critical care time: 30  Critical care time was exclusive of separately billable procedures and treating other patients.  Critical care was necessary to treat or prevent imminent or life-threatening deterioration.  Critical care was time spent personally by me on the following activities: development of treatment plan with patient and/or surrogate as well as nursing, discussions with consultants, evaluation of patient's response to treatment, examination of patient, obtaining history from patient or surrogate, ordering and performing treatments and interventions, ordering and review of laboratory studies, ordering and review of radiographic studies, pulse oximetry and re-evaluation of patient's condition.   Labs Review Labs Reviewed  CBC WITH DIFFERENTIAL - Abnormal; Notable for the following:    RBC 3.82 (*)    Hemoglobin 11.4 (*)    HCT 34.6 (*)    RDW 20.4 (*)    Monocytes Absolute 1.1 (*)    All other components within normal limits   COMPREHENSIVE METABOLIC PANEL - Abnormal; Notable for the following:    Sodium 136 (*)    Glucose, Bld 176 (*)    Calcium 8.3 (*)    Albumin 2.2 (*)    GFR calc non Af Amer 80 (*)    Anion gap 18 (*)    All other components within normal limits  URINALYSIS, ROUTINE W REFLEX MICROSCOPIC - Abnormal; Notable for the following:    Color, Urine AMBER (*)    All other components within normal limits  BLOOD GAS, ARTERIAL - Abnormal; Notable for the following:    pH, Arterial 7.478 (*)    pCO2 arterial 31.5 (*)    All other components within normal limits  CBC - Abnormal; Notable for the following:    RBC 3.39 (*)    Hemoglobin 10.3 (*)    HCT 30.9 (*)    RDW 20.5 (*)    All other components within normal  limits  SEDIMENTATION RATE - Abnormal; Notable for the following:    Sed Rate 45 (*)    All other components within normal limits  I-STAT CG4 LACTIC ACID, ED - Abnormal; Notable for the following:    Lactic Acid, Venous 6.02 (*)    All other components within normal limits  I-STAT CG4 LACTIC ACID, ED - Abnormal; Notable for the following:    Lactic Acid, Venous 3.41 (*)    All other components within normal limits  RAPID STREP SCREEN  MRSA PCR SCREENING  CULTURE, BLOOD (ROUTINE X 2)  CULTURE, BLOOD (ROUTINE X 2)  URINE CULTURE  CULTURE, GROUP A STREP  PATHOLOGIST SMEAR REVIEW  CBC  BASIC METABOLIC PANEL  MAGNESIUM  PHOSPHORUS  ANA  CYCLIC CITRUL PEPTIDE ANTIBODY, IGG  STREP PNEUMONIAE URINARY ANTIGEN  INFLUENZA PANEL BY PCR (TYPE A & B, H1N1)    Imaging Review Dg Chest Port 1 View  08/24/2014   CLINICAL DATA:  Shortness of breath, fevers, chills  EXAM: PORTABLE CHEST - 1 VIEW  COMPARISON:  CT chest dated 07/30/2014  FINDINGS: Right upper lobe opacity, suspicious for pneumonia.  Underlying chronic interstitial markings/ fibrosis. No pleural effusion or pneumothorax.  Cardiomegaly.  IMPRESSION: Right upper lobe opacity, suspicious for pneumonia.   Electronically Signed    By: Julian Hy M.D.   On: 08/24/2014 15:58     EKG Interpretation   Date/Time:  Friday August 24 2014 15:08:30 EST Ventricular Rate:  126 PR Interval:  147 QRS Duration: 83 QT Interval:  306 QTC Calculation: 443 R Axis:   -42 Text Interpretation:  Sinus tachycardia Inferior infarct, old Anterior  infarct, old Confirmed by Hazle Coca [id number]) on 08/24/2014 3:51:27 PM      MDM   Final diagnoses:  HCAP (healthcare-associated pneumonia)  Pt also with severe sepsis  Patient here with fever, shortness of breath, has history of polymyalgia rheumatica on steroids as well as tendinitis recently admitted for age. Patient only emergency department and sepsis protocol initiated on initial evaluation. Treat empirically for age, pending initial workup, patient with hypotension in the emergency department which did improve with IV fluids, lactate markedly elevated which did improve but did not clear with IV fluid administration. Given patient's history of steroid use there is concern for potential adrenal insufficiency and it is a slight course was administered.  D/w PCCM, will see the pt in the ED   Quintella Reichert, MD 08/25/14 [phone]

## 2014-08-24 NOTE — ED Notes (Signed)
Bed: WA06 Expected date:  Expected time:  Means of arrival:  Comments: 80 y/o F "not feeling well"

## 2014-08-24 NOTE — ED Notes (Signed)
MD at bedside. 

## 2014-08-24 NOTE — ED Notes (Signed)
HOB elevated to 45 degrees for medication administration through tube, HOB remained elevated to 45 degrees after medication administration through tube to prevent aspiration.

## 2014-08-24 NOTE — ED Notes (Signed)
HOB elevated to 45 degrees for tube feeding, HOB remained elevated for duration of transport to ICU. Pt transported onto ICU bed with HOB elevated. ICU RNs instructed that patient had received water and medication in tube immediately prior to transport and to keep Kila elevated.

## 2014-08-24 NOTE — Telephone Encounter (Signed)
New Msg   Patient daughter calling about Metoprolol, patient BP seems to be getting very low and she gets lethargic. States bottom number is 60 and they are concerned if patient should continue taking it. Please contact daughter [name] at [phone].

## 2014-08-24 NOTE — ED Notes (Addendum)
Per EMS pt saw PCP this morning for low grade fever, chills, SOB, steadily feeling worse. PCP measured O2 saturation of 74% on RA, EMS administered 2 L O2, O2 saturation 98% on O2. Per EMS pt was admitted for one week 2 weeks ago for pneumonia. Pt does not wear O2 at home. Two stage II pressure ulcers to sacrum, two black, necrotic toes to right foot. Pt's family states that plan for necrotic toes is to "let them fall off" and not to surgically remove them, per family physician.

## 2014-08-24 NOTE — Progress Notes (Addendum)
ANTIBIOTIC CONSULT NOTE - INITIAL  Pharmacy Consult for vancomycin/aztreonam/levofloxacin Indication: pneumonia and rule out sepsis   Allergies  Allergen Reactions  . Doxycycline Nausea And Vomiting  . Other Nausea And Vomiting and Other (See Comments)    Anesthesia makes her very sleepy and makes it hard for her to come out of it.   Marland Kitchen Penicillins Hives and Nausea And Vomiting  . Sulfa Antibiotics Nausea And Vomiting    Patient Measurements:   Adjusted Body Weight:   Vital Signs: Temp: 103.2 F (39.6 C) (11/27 1508) Temp Source: Rectal (11/27 1508) BP: 108/68 mmHg (11/27 1508) Pulse Rate: 52 (11/27 1508) Intake/Output from previous day:   Intake/Output from this shift:    Labs:  Recent Labs  08/24/14 1536  WBC 10.2  HGB 11.4*  PLT 165   Estimated Creatinine Clearance: 42.5 mL/min (by C-G formula based on Cr of 0.56). No results for input(s): VANCOTROUGH, VANCOPEAK, VANCORANDOM, GENTTROUGH, GENTPEAK, GENTRANDOM, TOBRATROUGH, TOBRAPEAK, TOBRARND, AMIKACINPEAK, AMIKACINTROU, AMIKACIN in the last 72 hours.   Microbiology: Recent Results (from the past 720 hour(s))  Blood culture (routine x 2)     Status: None   Collection Time: 07/28/14 12:21 PM  Result Value Ref Range Status   Specimen Description BLOOD LEFT WRIST  4 ML IN [facility] BOTTLE  Final   Special Requests NONE  Final   Culture  Setup Time   Final    07/28/2014 20:11 Performed at [facility]    Culture   Final    NO GROWTH 5 DAYS Performed at [facility]    Report Status 08/03/2014 FINAL  Final  Blood culture (routine x 2)     Status: None   Collection Time: 07/28/14 12:49 PM  Result Value Ref Range Status   Specimen Description BLOOD BLOOD LEFT FOREARM  Final   Special Requests BOTTLES DRAWN AEROBIC AND ANAEROBIC 5ML  Final   Culture  Setup Time   Final    07/28/2014 20:10 Performed at [facility]    Culture   Final    NO GROWTH 5 DAYS Performed at [facility]    Report Status 08/03/2014 FINAL  Final  Urine culture     Status: None   Collection Time: 07/28/14  2:03 PM  Result Value Ref Range Status   Specimen Description URINE, CLEAN CATCH  Final   Special Requests NONE  Final   Culture  Setup Time   Final    07/29/2014 02:28 Performed at [facility]   Final    30,000 COLONIES/ML Performed at [facility]    Culture   Final    STAPHYLOCOCCUS SPECIES (COAGULASE NEGATIVE) Note: RIFAMPIN AND GENTAMICIN SHOULD NOT BE USED AS SINGLE DRUGS FOR TREATMENT OF STAPH INFECTIONS. Performed at [facility]    Report Status 07/31/2014 FINAL  Final   Organism ID, Bacteria STAPHYLOCOCCUS SPECIES (COAGULASE NEGATIVE)  Final      Susceptibility   Staphylococcus species (coagulase negative) - MIC*    GENTAMICIN <=0.5 SENSITIVE Sensitive     LEVOFLOXACIN >=8 RESISTANT Resistant     NITROFURANTOIN <=16 SENSITIVE Sensitive     OXACILLIN >=4 RESISTANT Resistant     PENICILLIN >=0.5 RESISTANT Resistant     RIFAMPIN <=0.5 SENSITIVE Sensitive     TRIMETH/SULFA <=10 SENSITIVE Sensitive     VANCOMYCIN 1 SENSITIVE Sensitive     TETRACYCLINE <=1 SENSITIVE Sensitive     * STAPHYLOCOCCUS SPECIES (COAGULASE NEGATIVE)  MRSA PCR  Screening     Status: None   Collection Time: 07/28/14  3:52 PM  Result Value Ref Range Status   MRSA by PCR NEGATIVE NEGATIVE Final    Comment:        The GeneXpert MRSA Assay (FDA approved for NASAL specimens only), is one component of a comprehensive MRSA colonization surveillance program. It is not intended to diagnose MRSA infection nor to guide or monitor treatment for MRSA infections.  Culture, expectorated sputum-assessment     Status: None   Collection Time: 07/28/14  4:21 PM  Result Value Ref Range Status   Specimen Description SPUTUM  Final   Special Requests Immunocompromised  Final   Sputum evaluation   Final    THIS SPECIMEN IS ACCEPTABLE. RESPIRATORY CULTURE REPORT TO  FOLLOW.   Report Status 07/28/2014 FINAL  Final  Culture, respiratory (NON-Expectorated)     Status: None   Collection Time: 07/28/14  4:21 PM  Result Value Ref Range Status   Specimen Description SPUTUM  Final   Special Requests NONE  Final   Gram Stain   Final    NO WBC SEEN FEW SQUAMOUS EPITHELIAL CELLS PRESENT MODERATE GRAM POSITIVE RODS FEW GRAM NEGATIVE RODS FEW GRAM POSITIVE COCCI IN PAIRS    Culture   Final    NORMAL OROPHARYNGEAL FLORA Performed at [facility]    Report Status 07/31/2014 FINAL  Final  Ova and parasite examination     Status: None   Collection Time: 07/29/14  7:28 PM  Result Value Ref Range Status   Specimen Description STOOL  Final   Special Requests NONE  Final   Ova and parasites   Final    NO OVA OR PARASITES SEEN NO STRONGYLOIDES STERCORALIS SEEN Performed at [facility]    Report Status 07/31/2014 FINAL  Final  Clostridium Difficile by PCR     Status: None   Collection Time: 08/09/14 11:51 PM  Result Value Ref Range Status   C difficile by pcr NEGATIVE NEGATIVE Final    Comment: Performed at [facility] History: Past Medical History  Diagnosis Date  . Arthritis   . Polymyalgia   . Acid reflux   . Immune deficiency disorder     autoimmune disorder - polymyalgia - on prednisone  . Breast cancer     Treated with lumpectomy and radiation  . Giardia   . Pinworms    Martha Mcbride treated for recurrent pneumonia.    11/27 >> vancomycin  >> 11/27 >> aztreonam  >>   11/27 >> levofloxacin >>    Tmax: 103.2 WBCs: WNL Renal: SCr WNL for normalized CrCl =  59ml/min (using Scr = 0.8 for age) LA: 6.02  11/27 blood: 11/27 urine:  11/27 rapid strep: 11/27 influenza panel: 11/28 S. Pneumoniae Ag:   Goal of Therapy:  Vancomycin trough level 15-20 mcg/ml  Plan:   Vancomycin 1gm IV x 1 in ED then 500mg  IV q12h  Check steady-state trough  Monitor renal function  Aztreonam 2gm x 1 in ED then 1gm IV q8h  FYI - has tolerated  Cefepime as tolerated in past  Levofloxacin 750mg  IV q48h  Monitor culture results  Doreene Eland, PharmD, BCPS.   Pager: [phone]  08/24/2014,3:52 PM

## 2014-08-24 NOTE — Telephone Encounter (Signed)
Spoke with pt dtr, they are concerned because pt seems more lethargic than usual and her bp yesterday was 90/60. I called and spoke with leslie, the patients caregiver, patients bp this am was 115/72 w/o metoprolol. Her heart rate is 98 to 99, her O2 sat is 89%. The patient did have a low grade fever of 100.2 this morning but ibuprofen has decreased the temp to 97.2 She placed the patient on her oxygen and her O2 sat increased to 93%. She is going to go ahead and giver the metoprolol for the heart rate and monitor her bp. dtr aware of above. Follow up scheduled, they will call if they cont to have concerns.

## 2014-08-24 NOTE — Progress Notes (Signed)
eLink Physician-Brief Progress Note Patient Name: SHANAYE RIEF DOB: [birth year] MRN: [id number]   Date of Service  08/24/2014  HPI/Events of Note  Arrived to ICU  eICU Interventions  Start tube feedings     Intervention Category Minor Interventions: Routine modifications to care plan (e.g. PRN medications for pain, fever)  MCQUAID, DOUGLAS 08/24/2014, 8:53 PM

## 2014-08-24 NOTE — H&P (Signed)
Name: Martha Mcbride MRN: [id number] DOB: 05/06/1925    ADMISSION DATE:  08/24/2014  REFERRING MD :  EDP  CHIEF COMPLAINT:  Generalized weakness, fever  BRIEF PATIENT DESCRIPTION: 80 year old with interstitial lung disease, presumed IPF, polymyalgia on chronic prednisone, chronic diastolic heart failure and severe dysphagia status post PEG presents with her third episode of HCAP in the last 6 weeks, with severe sepsis, lactate of 6 She also has a new malar  rash  SIGNIFICANT EVENTS  admission 10-27/15 -health care associated PNA, encephalopathy, and diastolic HF exacerbation.  Admitted 07/28/14 with fever 103, confusion,productive cough. WBC at 12, Lactic acid at 3.5. Chest x ray -diffuse bilateral pulmonary interstitial infiltrates.& eosinophilia (AEC 1800 on 10/22 which came down to 600 on 10/31). Diarrhea is resolved & felt unlikely to be parasitic by ID - stool O & P neg.  STUDIES:  07/30/14 CT: Evidence of emphysema with traction bronchiectasis in multiple areas of interstitial fibrosis. This appearance is consistent with underlying usual interstitial pneumonitis. There are areas of patchy consolidation in both lower lobes as wellas to a lesser extent in the upper lobes. There also moderate pleural effusions. The areas of consolidation are felt to most likely represent superimposed pneumonia. There may be a degree of superimposed congestive heart failure as well given the pleural effusions.5 mm nodular opacity in the posterior segment of the right upper lobe. Areas of atherosclerotic change and cardiac valvular calcification. Areas of relatively mild adenopathy. Cholelithiasis.  04/2012 CT abd >>large hiatal hernia . Prominent interstitial lung markings suggest chronic changes  06/2014 TEE mild MS, mod AS, nml LVfn Ova and parasite stool neg.  Strongyloids antibody pending.  Quantiferon Tb neg    HISTORY OF PRESENT ILLNESS:  80 y.o. Woman, never smoker with interstitial lung  disease presumed IPF PMH significant for Polymyalgia on prednisone, severely calcified mitral valve with moderate stenosis, chronic diastolic heart failure She was admitted twice in October 2015 for HCAP & severe sepsis. She was found to have severe dysphagia, and PEG was placed on last admission. She was last seen in the office on 11/17, was in a wheelchair and seemed to be recovering well. She developed a sore throat followed by fever and generalized weakness and a cough. She was asked by PCP to go to urgent care, from where she was sent to the ED. She was found to have a right upper lobe infiltrate and a lactate of 6, hypotension responded to fluids-hence [facility] asked to admit She is maintained on low-dose prednisone for polymyalgia Daughters [name] and gail are at the bedside, she has not been taking by mouth at all  PAST MEDICAL HISTORY :   has a past medical history of Arthritis; Polymyalgia; Acid reflux; Immune deficiency disorder; Breast cancer; Giardia; and Pinworms.  has past surgical history that includes Breast lumpectomy; Partial hysterectomy; Tonsillectomy; and TEE without cardioversion (N/A, 07/20/2014). Prior to Admission medications   Medication Sig Start Date End Date Taking? Authorizing Provider  aspirin 81 MG chewable tablet Place 1 tablet (81 mg total) into feeding tube daily. 08/10/14  Yes Charlynne Cousins, MD  benzonatate (TESSALON) 200 MG capsule Place 1 capsule (200 mg total) into feeding tube 3 (three) times daily as needed for cough. 08/10/14  Yes Charlynne Cousins, MD  folic acid (FOLVITE) 1 MG tablet Place 1 tablet (1 mg total) into feeding tube daily. 08/10/14  Yes Charlynne Cousins, MD  furosemide (LASIX) 8 MG/ML solution Place 2.5 mLs (20 mg total) into  feeding tube daily. 08/09/14  Yes Charlynne Cousins, MD  hydroxypropyl methylcellulose / hypromellose (ISOPTO TEARS / GONIOVISC) 2.5 % ophthalmic solution Place 1 drop into both eyes every morning.   Yes  Historical Provider, MD  metoprolol tartrate (LOPRESSOR) 25 mg/10 mL SUSP Place 2.5 mLs (6.25 mg total) into feeding tube 2 (two) times daily. 08/22/14  Yes Lelon Perla, MD  Nutritional Supplements (FEEDING SUPPLEMENT, JEVITY 1.2 CAL,) LIQD Place 237 mLs into feeding tube 5 (five) times daily. 08/10/14  Yes Charlynne Cousins, MD  potassium chloride 20 MEQ/15ML (10%) SOLN Place 15 mLs (20 mEq total) into feeding tube daily. 08/09/14  Yes Charlynne Cousins, MD  prednisoLONE (PRELONE) 15 MG/5ML SOLN Place 1.7 mLs (5.1 mg total) into feeding tube daily before breakfast. Patient taking differently: Place 12 mg into feeding tube daily before breakfast.  08/09/14  Yes Charlynne Cousins, MD  saccharomyces boulardii (FLORASTOR) 250 MG capsule Take 1 capsule (250 mg total) by mouth 2 (two) times daily. Patient taking differently: 250 mg by Feeding Tube route 2 (two) times daily.  08/10/14  Yes Charlynne Cousins, MD  Water For Irrigation, Sterile (FREE WATER) SOLN Place 120 mLs into feeding tube 4 (four) times daily. 08/09/14  Yes Charlynne Cousins, MD  pantoprazole sodium (PROTONIX) 40 mg/20 mL PACK Place 20 mLs (40 mg total) into feeding tube 2 (two) times daily. Patient not taking: Reported on 08/24/2014 08/09/14   Charlynne Cousins, MD  RABEprazole (ACIPHEX) 20 MG tablet Take 1 tablet (20 mg total) by mouth daily. Patient not taking: Reported on 08/24/2014 08/10/14   Charlynne Cousins, MD   Allergies  Allergen Reactions  . Doxycycline Nausea And Vomiting  . Other Nausea And Vomiting and Other (See Comments)    Anesthesia makes her very sleepy and makes it hard for her to come out of it.   Marland Kitchen Penicillins Hives and Nausea And Vomiting  . Sulfa Antibiotics Nausea And Vomiting    FAMILY HISTORY:  family history includes Stroke in her mother. SOCIAL HISTORY:  reports that she has never smoked. She does not have any smokeless tobacco history on file. She reports that she does not drink  alcohol or use illicit drugs.  REVIEW OF SYSTEMS:   Constitutional: Positive for fever, chills, weight loss, malaise/fatigue and diaphoresis.  HENT: Positive for hearing loss, negative for ear pain, nosebleeds, congestion, sore throat, neck pain, tinnitus and ear discharge.   Eyes: Negative for blurred vision, double vision, photophobia, pain, discharge and redness.  Respiratory: Positive for cough, hemoptysis, sputum production, shortness of breath, no  wheezing and stridor.   Cardiovascular: Negative for chest pain, palpitations, orthopnea, claudication, leg swelling and PND.  Gastrointestinal: Negative for heartburn, nausea, vomiting, abdominal pain, diarrhea, constipation, blood in stool and melena.  Genitourinary: Negative for dysuria, urgency, frequency, hematuria and flank pain.  Musculoskeletal: Negative for myalgias, back pain, joint pain and falls.  Skin: Positive for facial rash.  Neurological: Negative for dizziness, tingling, tremors, sensory change, speech change, focal weakness, seizures, loss of consciousness, weakness and headaches.  Endo/Heme/Allergies: Negative for environmental allergies and polydipsia. Does not bruise/bleed easily.  SUBJECTIVE:   VITAL SIGNS: Temp:  [103.2 F (39.6 C)] 103.2 F (39.6 C) (11/27 1508) Pulse Rate:  [52-131] 98 (11/27 1813) Resp:  [20-38] 32 (11/27 1813) BP: (77-108)/(32-68) 106/52 mmHg (11/27 1813) SpO2:  [93 %-100 %] 93 % (11/27 1813) Weight:  [62.234 kg (137 lb 3.2 oz)] 62.234 kg (137 lb 3.2 oz) (11/27  1558)  PHYSICAL EXAMINATION: Gen. Chronically ill, elderly woman in mild distress, normal affect ENT - no lesions, no post nasal drip, red pharynx, malar rash Neck: No JVD, no thyromegaly, no carotid bruits Lungs: no use of accessory muscles, no dullness to percussion, bibasal rales , no rhonchi  Cardiovascular: Rhythm regular, heart sounds  normal, no murmurs, no peripheral edema Abdomen: soft and non-tender, no hepatosplenomegaly,  BS normal. Musculoskeletal: No deformities, no cyanosis or clubbing Neuro:  alert, non focal Skin:  Warm, malar rash over face    Recent Labs Lab 08/24/14 1536  NA 136*  K 5.2  CL 97  CO2 21  BUN 20  CREATININE 0.60  GLUCOSE 176*    Recent Labs Lab 08/24/14 1536  HGB 11.4*  HCT 34.6*  WBC 10.2  PLT 165   Dg Chest Port 1 View  08/24/2014   CLINICAL DATA:  Shortness of breath, fevers, chills  EXAM: PORTABLE CHEST - 1 VIEW  COMPARISON:  CT chest dated 07/30/2014  FINDINGS: Right upper lobe opacity, suspicious for pneumonia.  Underlying chronic interstitial markings/ fibrosis. No pleural effusion or pneumothorax.  Cardiomegaly.  IMPRESSION: Right upper lobe opacity, suspicious for pneumonia.   Electronically Signed   By: Julian Hy M.D.   On: 08/24/2014 15:58    ASSESSMENT / PLAN:  Healthcare associated pneumonia -recurrent Underlying interstitial lung disease, presumed IPF New malar rash  Differential diagnosis here includes aspiration pneumonia. We'll also check for usual suspects including flu, urine strep antigen, strep screen. Use broad-spectrum antibiotics-aztreonam, vanc and Levaquin -penicillin allergy noted Given new rash, would check ESR, ANA screen and CCP-although I would not rush to diagnose collagenous vascular disease in her age group  Severe sepsis - hypertension seems to have responded to fluids Hold metoprolol and Lasix Repeat lactate for clearance  Volume status and tissue perfusion was assessed clinically by examination of cardiopulmonary system, good cap refill, good peripheral pulses, pink skin color and mucous membranes. Lactate clearance will be assessed.   Dysphagia- keep nothing by mouth for now Tube feeds can be resumed once blood pressure is noted to be stable Wonder if she is still aspirating, in spite of her PEG due to hiatal hernia  Polymyalgia- Consider stress dose steroids, if remains hypotensive Otherwise can resume 5 mg  prednisone daily  Above plan was discussed with the daughters  The patient is critically ill with multiple organ systems failure and requires high complexity decision making for assessment and support, frequent evaluation and titration of therapies, application of advanced monitoring technologies and extensive interpretation of multiple databases. Critical Care Time devoted to patient care services described in this note  is 45 minutes.   Kara Mead MD. [place] Flood. [facility] Pager [phone] If no response call [phone]    08/24/2014, 6:18 PM

## 2014-08-24 NOTE — ED Notes (Signed)
Per family, patient has had "3 episodes of diarrhea since she's been here and I just found this out that if that happens, there should be hospital intervention"Patients daughter just came out of the room to tell me that if a patient has 3 episodes of diarrhea that there needs to be a hospital intervention. Family is getting very antsy and wanting to know who the nurse is going to be in ICU, if she can eat, and explaining that they are not happy with ICU and that this could be a "potential law suit." I have explained to them that we have been waiting for the ICU to contact us for a report and wether or not the patient can eat. Family is blaming lack of food for why the patient has had this diarrhea. Notified Nurse Lanelle Bal as this has been happening.  I have only seen one of these episodes of diarrhea. Family has cleaned up patient on their own without calling. I have gone in as often as I could to get vitals every 30 minutes.

## 2014-08-25 ENCOUNTER — Inpatient Hospital Stay (HOSPITAL_COMMUNITY): Payer: Medicare Other

## 2014-08-25 ENCOUNTER — Encounter (HOSPITAL_COMMUNITY): Payer: Self-pay | Admitting: *Deleted

## 2014-08-25 LAB — CBC
HCT: 30.5 % — ABNORMAL LOW (ref 36.0–46.0)
Hemoglobin: 10 g/dL — ABNORMAL LOW (ref 12.0–15.0)
MCH: 30.3 pg (ref 26.0–34.0)
MCHC: 32.8 g/dL (ref 30.0–36.0)
MCV: 92.4 fL (ref 78.0–100.0)
Platelets: 135 10*3/uL — ABNORMAL LOW (ref 150–400)
RBC: 3.3 MIL/uL — AB (ref 3.87–5.11)
RDW: 20.6 % — ABNORMAL HIGH (ref 11.5–15.5)
WBC: 8.4 10*3/uL (ref 4.0–10.5)

## 2014-08-25 LAB — STREP PNEUMONIAE URINARY ANTIGEN: Strep Pneumo Urinary Antigen: NEGATIVE

## 2014-08-25 LAB — BASIC METABOLIC PANEL
ANION GAP: 12 (ref 5–15)
BUN: 15 mg/dL (ref 6–23)
CALCIUM: 7.5 mg/dL — AB (ref 8.4–10.5)
CO2: 22 meq/L (ref 19–32)
Chloride: 100 mEq/L (ref 96–112)
Creatinine, Ser: 0.51 mg/dL (ref 0.50–1.10)
GFR calc Af Amer: >90 mL/min (ref >90)
GFR calc non Af Amer: 84 mL/min — ABNORMAL LOW (ref >90)
Glucose, Bld: 203 mg/dL — ABNORMAL HIGH (ref 70–99)
POTASSIUM: 4 meq/L (ref 3.7–5.3)
SODIUM: 134 meq/L — AB (ref 137–147)

## 2014-08-25 LAB — INFLUENZA PANEL BY PCR (TYPE A & B)
H1N1 flu by pcr: NOT DETECTED
INFLBPCR: NEGATIVE
Influenza A By PCR: NEGATIVE

## 2014-08-25 LAB — GLUCOSE, CAPILLARY
GLUCOSE-CAPILLARY: 180 mg/dL — AB (ref 70–99)
GLUCOSE-CAPILLARY: 221 mg/dL — AB (ref 70–99)
Glucose-Capillary: 201 mg/dL — ABNORMAL HIGH (ref 70–99)
Glucose-Capillary: 113 mg/dL — ABNORMAL HIGH (ref 70–99)
Glucose-Capillary: 101 mg/dL — ABNORMAL HIGH (ref 70–99)

## 2014-08-25 LAB — MAGNESIUM: MAGNESIUM: 1.8 mg/dL (ref 1.5–2.5)

## 2014-08-25 LAB — URINE CULTURE
COLONY COUNT: NO GROWTH
Culture: NO GROWTH

## 2014-08-25 LAB — PHOSPHORUS: PHOSPHORUS: 2.6 mg/dL (ref 2.3–4.6)

## 2014-08-25 MED ORDER — SACCHAROMYCES BOULARDII 250 MG PO CAPS
250.0000 mg | ORAL_CAPSULE | Freq: Two times a day (BID) | ORAL | Status: DC
Start: 2014-08-25 — End: 2014-09-05
  Administered 2014-08-25 – 2014-09-05 (×22): 250 mg via ORAL
  Filled 2014-08-25 (×24): qty 1

## 2014-08-25 MED ORDER — INSULIN ASPART 100 UNIT/ML ~~LOC~~ SOLN
2.0000 [IU] | SUBCUTANEOUS | Status: DC
  Administered 2014-08-25: 6 [IU] via SUBCUTANEOUS
  Administered 2014-08-25: 4 [IU] via SUBCUTANEOUS
  Administered 2014-08-25: 6 [IU] via SUBCUTANEOUS
  Administered 2014-08-26 – 2014-08-28 (×4): 2 [IU] via SUBCUTANEOUS
  Administered 2014-08-28 – 2014-08-29 (×3): 4 [IU] via SUBCUTANEOUS
  Administered 2014-08-30 – 2014-08-31 (×3): 2 [IU] via SUBCUTANEOUS
  Administered 2014-08-31: 4 [IU] via SUBCUTANEOUS
  Administered 2014-08-31 – 2014-09-01 (×2): 2 [IU] via SUBCUTANEOUS
  Administered 2014-09-01: 4 [IU] via SUBCUTANEOUS

## 2014-08-25 MED ORDER — JEVITY 1.2 CAL PO LIQD: 1000.0000 mL | ORAL | Status: DC

## 2014-08-25 MED ORDER — LEVOFLOXACIN IN D5W 750 MG/150ML IV SOLN
750.0000 mg | INTRAVENOUS | Status: AC
  Administered 2014-08-25 – 2014-09-01 (×8): 750 mg via INTRAVENOUS
  Filled 2014-08-25 (×9): qty 150

## 2014-08-25 MED ORDER — RANITIDINE HCL 150 MG/10ML PO SYRP
150.0000 mg | ORAL_SOLUTION | Freq: Every day | ORAL | Status: DC
  Administered 2014-08-25 – 2014-09-04 (×11): 150 mg
  Filled 2014-08-25 (×12): qty 10

## 2014-08-25 MED ORDER — CHLORHEXIDINE GLUCONATE 0.12 % MT SOLN
15.0000 mL | Freq: Two times a day (BID) | OROMUCOSAL | Status: DC
  Administered 2014-08-25 – 2014-09-05 (×20): 15 mL via OROMUCOSAL
  Filled 2014-08-25 (×24): qty 15

## 2014-08-25 MED ORDER — PREDNISOLONE 15 MG/5ML PO SOLN
4.5000 mg | Freq: Every day | ORAL | Status: DC
  Filled 2014-08-25: qty 5

## 2014-08-25 MED ORDER — PREDNISONE 5 MG/5ML PO SOLN: 15.0000 mg | Freq: Every day | ORAL | Status: DC

## 2014-08-25 MED ORDER — FAMOTIDINE 40 MG/5ML PO SUSR
20.0000 mg | Freq: Every day | ORAL | Status: DC
  Filled 2014-08-25: qty 2.5

## 2014-08-25 MED ORDER — CETYLPYRIDINIUM CHLORIDE 0.05 % MT LIQD
7.0000 mL | Freq: Two times a day (BID) | OROMUCOSAL | Status: DC
  Administered 2014-08-26 – 2014-09-05 (×22): 7 mL via OROMUCOSAL

## 2014-08-25 MED ORDER — PREDNISOLONE 15 MG/5ML PO SOLN
4.5000 mg | Freq: Every day | ORAL | Status: DC
  Administered 2014-08-26 – 2014-09-03 (×9): 4.5 mg
  Filled 2014-08-25 (×11): qty 5

## 2014-08-25 MED ORDER — PREDNISONE 5 MG/5ML PO SOLN
5.0000 mg | Freq: Every day | ORAL | Status: DC
  Filled 2014-08-25 (×2): qty 5

## 2014-08-25 NOTE — Progress Notes (Signed)
Family insisting that tube feeding is causing diarrhea for the pt (4X in last hour per family). Family insisting on speaking with dietitian. Writer paged and dietitian has called into pt's room and spoke with pt's daughter.

## 2014-08-25 NOTE — Progress Notes (Signed)
Name: Martha Mcbride MRN: [id number] DOB: 04/23/1925    ADMISSION DATE:  08/24/2014  REFERRING MD :  EDP  CHIEF COMPLAINT:  Generalized weakness, fever  BRIEF PATIENT DESCRIPTION: 80 year old with interstitial lung disease, presumed IPF, polymyalgia on chronic prednisone, chronic diastolic heart failure and severe dysphagia status post PEG presents with her third episode of HCAP in the last 6 weeks, with severe sepsis, lactate of 6 She also has a new malar  rash  SIGNIFICANT EVENTS  admission 10-27/15 -health care associated PNA, encephalopathy, and diastolic HF exacerbation.  Admitted 07/28/14 with fever 103, confusion,productive cough. WBC at 12, Lactic acid at 3.5. Chest x ray -diffuse bilateral pulmonary interstitial infiltrates.& eosinophilia (AEC 1800 on 10/22 which came down to 600 on 10/31). Diarrhea is resolved & felt unlikely to be parasitic by ID - stool O & P neg.  STUDIES:  07/30/14 CT: Evidence of emphysema with traction bronchiectasis in multiple areas of interstitial fibrosis. This appearance is consistent with underlying usual interstitial pneumonitis. There are areas of patchy consolidation in both lower lobes as wellas to a lesser extent in the upper lobes. There also moderate pleural effusions. The areas of consolidation are felt to most likely represent superimposed pneumonia. There may be a degree of superimposed congestive heart failure as well given the pleural effusions.5 mm nodular opacity in the posterior segment of the right upper lobe. Areas of atherosclerotic change and cardiac valvular calcification. Areas of relatively mild adenopathy. Cholelithiasis.  04/2012 CT abd >>large hiatal hernia . Prominent interstitial lung markings suggest chronic changes  06/2014 TEE mild MS, mod AS, nml LVfn Ova and parasite stool neg.  Strongyloids antibody pending.  Quantiferon Tb neg   SUBJECTIVE:   VITAL SIGNS: Temp:  [97.9 F (36.6 C)-103.2 F (39.6 C)] 97.9 F  (36.6 C) (11/27 2000) Pulse Rate:  [52-131] 86 (11/28 0600) Resp:  [15-38] 20 (11/28 0600) BP: (77-154)/(32-68) 101/47 mmHg (11/28 0600) SpO2:  [92 %-100 %] 97 % (11/28 0600) Weight:  [62.234 kg (137 lb 3.2 oz)-66.7 kg (147 lb 0.8 oz)] 66.7 kg (147 lb 0.8 oz) (11/28 0533)  PHYSICAL EXAMINATION: Gen. Chronically ill, elderly woman in mild distress, normal affect ENT - no lesions, no post nasal drip, red pharynx, malar rash Neck: No JVD, no thyromegaly, no carotid bruits Lungs: no use of accessory muscles, no dullness to percussion, bibasal rales , no rhonchi  Cardiovascular: Rhythm regular, heart sounds  normal, no murmurs, no peripheral edema Abdomen: soft and non-tender, no hepatosplenomegaly, BS normal. Musculoskeletal: No deformities, no cyanosis or clubbing Neuro:  alert, non focal Skin:  Warm, malar rash over face    Recent Labs Lab 08/24/14 1536 08/25/14 0527  NA 136* 134*  K 5.2 4.0  CL 97 100  CO2 21 22  BUN 20 15  CREATININE 0.60 0.51  GLUCOSE 176* 203*    Recent Labs Lab 08/24/14 1536 08/24/14 1820 08/25/14 0527  HGB 11.4* 10.3* 10.0*  HCT 34.6* 30.9* 30.5*  WBC 10.2 7.9 8.4  PLT 165 150 135*   Dg Chest Port 1 View  08/25/2014   CLINICAL DATA:  Acute onset of shortness of breath. Follow-up study.  EXAM: PORTABLE CHEST - 1 VIEW  COMPARISON:  Chest radiograph performed 08/24/2014  FINDINGS: There has been interval redistribution of the patient's airspace opacities, now seen in a perihilar distribution. This raises suspicion for pulmonary edema, although multifocal pneumonia might have a similar appearance. Small bilateral pleural effusions are suspected. No pneumothorax is seen.  The cardiomediastinal  silhouette is mildly enlarged. No acute osseous abnormalities are identified.  IMPRESSION: 1. Interval redistribution of airspace opacities, now seen in a perihilar distribution. This raises suspicion for pulmonary edema, though multifocal pneumonia might have a  similar appearance. Suspect small bilateral pleural effusions. 2. Mild cardiomegaly.   Electronically Signed   By: Garald Balding M.D.   On: 08/25/2014 06:43   Dg Chest Port 1 View  08/24/2014   CLINICAL DATA:  Shortness of breath, fevers, chills  EXAM: PORTABLE CHEST - 1 VIEW  COMPARISON:  CT chest dated 07/30/2014  FINDINGS: Right upper lobe opacity, suspicious for pneumonia.  Underlying chronic interstitial markings/ fibrosis. No pleural effusion or pneumothorax.  Cardiomegaly.  IMPRESSION: Right upper lobe opacity, suspicious for pneumonia.   Electronically Signed   By: Julian Hy M.D.   On: 08/24/2014 15:58    ASSESSMENT / PLAN:  Healthcare associated pneumonia -recurrent Suspected superimposed cardiogenic pulm edema Underlying interstitial lung disease, presumed IPF New malar rash  Differential diagnosis here includes aspiration pneumonia, cardiogenic edema. We'll also check for usual suspects including flu, urine strep antigen, strep screen. Use broad-spectrum antibiotics-aztreonam, vanc and Levaquin -penicillin allergy noted ESR, ANA screen and CCP pending -although would not rush to diagnose collagenous vascular disease in her age group  Severe sepsis - responded to fluids Hold metoprolol and Lasix  Dysphagia- keep nothing by mouth for now Restart TF 11/28 Likely still aspirating, in spite of her PEG due to hiatal hernia, consider a modified barium study with contrast injected into the PEG to look for reflux  Polymyalgia- Consider stress dose steroids, if remains hypotensive Otherwise can resume 15 mg prednisone daily (her pred was being slowly tapered, last dose at home was 42m)  RBaltazar Apo MD, PhD 08/25/2014, 9:04 AM [facility] [phone] if no answer [phone]

## 2014-08-25 NOTE — Progress Notes (Signed)
Tube feeding stopped per MD order.

## 2014-08-25 NOTE — Progress Notes (Signed)
ANTIBIOTIC CONSULT NOTE -  Pharmacy Consult for vancomycin/aztreonam/levofloxacin Indication: pneumonia and rule out sepsis   Allergies  Allergen Reactions  . Doxycycline Nausea And Vomiting  . Other Nausea And Vomiting and Other (See Comments)    Anesthesia makes her very sleepy and makes it hard for her to come out of it.   Marland Kitchen Penicillins Hives and Nausea And Vomiting  . Sulfa Antibiotics Nausea And Vomiting    Patient Measurements: Height: 5\' 3"  (160 cm) Weight: 147 lb 0.8 oz (66.7 kg) IBW/kg (Calculated) : 52.4  Vital Signs: Temp: 98.2 F (36.8 C) (11/28 0800) Temp Source: Oral (11/28 0800) BP: 101/47 mmHg (11/28 0600) Pulse Rate: 86 (11/28 0600) Intake/Output from previous day: 11/27 0701 - 11/28 0700 In: 1038.3 [I.V.:528.3; IV Piggyback:350] Out: 625 [Urine:625] Intake/Output from this shift:    Labs:  Recent Labs  08/24/14 1536 08/24/14 1820 08/25/14 0527  WBC 10.2 7.9 8.4  HGB 11.4* 10.3* 10.0*  PLT 165 150 135*  CREATININE 0.60  --  0.51   Estimated Creatinine Clearance: 45.4 mL/min (by C-G formula based on Cr of 0.51). No results for input(s): VANCOTROUGH, VANCOPEAK, VANCORANDOM, GENTTROUGH, GENTPEAK, GENTRANDOM, TOBRATROUGH, TOBRAPEAK, TOBRARND, AMIKACINPEAK, AMIKACINTROU, AMIKACIN in the last 72 hours.   Microbiology: Recent Results (from the past 720 hour(s))  Blood culture (routine x 2)     Status: None   Collection Time: 07/28/14 12:21 PM  Result Value Ref Range Status   Specimen Description BLOOD LEFT WRIST  4 ML IN [facility] BOTTLE  Final   Special Requests NONE  Final   Culture  Setup Time   Final    07/28/2014 20:11 Performed at [facility]    Culture   Final    NO GROWTH 5 DAYS Performed at [facility]    Report Status 08/03/2014 FINAL  Final  Blood culture (routine x 2)     Status: None   Collection Time: 07/28/14 12:49 PM  Result Value Ref Range Status   Specimen Description BLOOD BLOOD LEFT FOREARM  Final   Special  Requests BOTTLES DRAWN AEROBIC AND ANAEROBIC 5ML  Final   Culture  Setup Time   Final    07/28/2014 20:10 Performed at [facility]    Culture   Final    NO GROWTH 5 DAYS Performed at [facility]    Report Status 08/03/2014 FINAL  Final  Urine culture     Status: None   Collection Time: 07/28/14  2:03 PM  Result Value Ref Range Status   Specimen Description URINE, CLEAN CATCH  Final   Special Requests NONE  Final   Culture  Setup Time   Final    07/29/2014 02:28 Performed at [place]   Final    30,000 COLONIES/ML Performed at [facility]    Culture   Final    STAPHYLOCOCCUS SPECIES (COAGULASE NEGATIVE) Note: RIFAMPIN AND GENTAMICIN SHOULD NOT BE USED AS SINGLE DRUGS FOR TREATMENT OF STAPH INFECTIONS. Performed at [facility]    Report Status 07/31/2014 FINAL  Final   Organism ID, Bacteria STAPHYLOCOCCUS SPECIES (COAGULASE NEGATIVE)  Final      Susceptibility   Staphylococcus species (coagulase negative) - MIC*    GENTAMICIN <=0.5 SENSITIVE Sensitive     LEVOFLOXACIN >=8 RESISTANT Resistant     NITROFURANTOIN <=16 SENSITIVE Sensitive     OXACILLIN >=4 RESISTANT Resistant     PENICILLIN >=0.5 RESISTANT Resistant     RIFAMPIN <=0.5  SENSITIVE Sensitive     TRIMETH/SULFA <=10 SENSITIVE Sensitive     VANCOMYCIN 1 SENSITIVE Sensitive     TETRACYCLINE <=1 SENSITIVE Sensitive     * STAPHYLOCOCCUS SPECIES (COAGULASE NEGATIVE)  MRSA PCR Screening     Status: None   Collection Time: 07/28/14  3:52 PM  Result Value Ref Range Status   MRSA by PCR NEGATIVE NEGATIVE Final    Comment:        The GeneXpert MRSA Assay (FDA approved for NASAL specimens only), is one component of a comprehensive MRSA colonization surveillance program. It is not intended to diagnose MRSA infection nor to guide or monitor treatment for MRSA infections.  Culture, expectorated sputum-assessment     Status: None   Collection Time: 07/28/14   4:21 PM  Result Value Ref Range Status   Specimen Description SPUTUM  Final   Special Requests Immunocompromised  Final   Sputum evaluation   Final    THIS SPECIMEN IS ACCEPTABLE. RESPIRATORY CULTURE REPORT TO FOLLOW.   Report Status 07/28/2014 FINAL  Final  Culture, respiratory (NON-Expectorated)     Status: None   Collection Time: 07/28/14  4:21 PM  Result Value Ref Range Status   Specimen Description SPUTUM  Final   Special Requests NONE  Final   Gram Stain   Final    NO WBC SEEN FEW SQUAMOUS EPITHELIAL CELLS PRESENT MODERATE GRAM POSITIVE RODS FEW GRAM NEGATIVE RODS FEW GRAM POSITIVE COCCI IN PAIRS    Culture   Final    NORMAL OROPHARYNGEAL FLORA Performed at [facility]    Report Status 07/31/2014 FINAL  Final  Ova and parasite examination     Status: None   Collection Time: 07/29/14  7:28 PM  Result Value Ref Range Status   Specimen Description STOOL  Final   Special Requests NONE  Final   Ova and parasites   Final    NO OVA OR PARASITES SEEN NO STRONGYLOIDES STERCORALIS SEEN Performed at [facility]    Report Status 07/31/2014 FINAL  Final  Clostridium Difficile by PCR     Status: None   Collection Time: 08/09/14 11:51 PM  Result Value Ref Range Status   C difficile by pcr NEGATIVE NEGATIVE Final    Comment: Performed at [facility]  MRSA PCR Screening     Status: None   Collection Time: 08/24/14  9:35 PM  Result Value Ref Range Status   MRSA by PCR NEGATIVE NEGATIVE Final    Comment:        The GeneXpert MRSA Assay (FDA approved for NASAL specimens only), is one component of a comprehensive MRSA colonization surveillance program. It is not intended to diagnose MRSA infection nor to guide or monitor treatment for MRSA infections.   Rapid strep screen     Status: None   Collection Time: 08/24/14 10:00 PM  Result Value Ref Range Status   Streptococcus, Group A Screen (Direct) NEGATIVE NEGATIVE Final    Comment: (NOTE) A Rapid  Antigen test may result negative if the antigen level in the sample is below the detection level of this test. The FDA has not cleared this test as a stand-alone test therefore the rapid antigen negative result has reflexed to a Group A Strep culture.     Medical History: Past Medical History  Diagnosis Date  . Arthritis   . Polymyalgia   . Acid reflux   . Immune deficiency disorder     autoimmune disorder - polymyalgia - on  prednisone  . Breast cancer     Treated with lumpectomy and radiation  . Giardia   . Pinworms   . History of hiatal hernia   . Pneumonia   . Dementia   . CHF (congestive heart failure)   . Dysrhythmia    Assessment: 33 YOF presents with shortness of breath and altered mental status.  Family states she has had weakness and temp to 100.4 x 2 days (Fever in ED).  Complains of sore throat and facial rash. She has been given vancomycin levofloxacin and aztreonam x 1 dose in ED.  She has PCN allergy of hives and N/V - tolerated cefepime on previous admission.  She is being treated for recurrent pneumonia.    11/27 >> vancomycin  >> 11/27 >> aztreonam  >>   11/27 >> levofloxacin >>    Tmax: 103.2 WBCs: WNL Renal: SCr WNL for normalized CrCl = 87ml/min (using Scr = 0.8 for age) LA: 6.02  11/27 blood: 11/27 urine:  11/27 rapid strep: 11/27 influenza panel: 11/28 S. Pneumoniae Ag:   Goal of Therapy:  Vancomycin trough level 15-20 mcg/ml  Plan:   Continue Vancomycin 500mg  IV q12h  Check steady-state trough in AM  Monitor renal function  Continue Aztreonam 1gm IV q8h  FYI - has tolerated Cefepime as tolerated in past  Adjust Levofloxacin to 750mg  IV q24h for renal function  Monitor culture results  Kizzie Furnish, PharmD Pager: [phone] 08/25/2014 1:53 PM

## 2014-08-25 NOTE — Progress Notes (Signed)
PHARMACY BRIEF NOTE - Clarification of home medication (Prednisone vs. Prednisolone)  Mrs. Obsorne's daughter has provided information about her home medications,  but was not able to state whether she had been giving Prednisone or Prednisolone liquid.  Another daughter brought the prescription bottle from home to the patient's room this afternoon.   The medication has been identified as Prednisolone liquid 15 mg/5 ml.  The current dose is 1.5 ml (4.5 mg) daily.  I talked with Dr. Lamonte Sakai this morning about the possibility that the home medication was not Prednisone but rather Prednisolone, as documented in the Medication History.  He asked me to enter an order for the correct medication once it was identified.  The original Prednisone order has been discontinued, and an order for Prednisolone liquid 4.5 mg daily has been entered.  [place]. 08/25/2014 2:41 PM

## 2014-08-25 NOTE — Progress Notes (Signed)
Please note that pt has necrotic toes to right foot and sacral ulcers X 3 that family refuses "hospital wound care" and are putting their own medications ("End It" and Benzocaine spray).  Writer has shown, and explained, to the 2 daughters that stay with the pt, the severity of the wounds and that pt could definitely benefit from [facility] consult. Daughters of pt have agreed to wound consult.

## 2014-08-25 NOTE — Progress Notes (Signed)
Pt being frequently checked, cleaned, and changed by 2 daughters in room.  Noted HR to be jumping up to 180s for less than a second and then back to 90's. Staff member changed lead pads and better look on monitor and HR staying in 90's. Will continue to monitor.  Pt asymptomatic. Now resting. O2 continues at 2 L/min.

## 2014-08-25 NOTE — Progress Notes (Signed)
  Dietician called elink. Patient family very upset and threatening to sue - very upset of severe diarrhea. According to family diarrhea  All day but per RD who has learnt from RN  Diarrhea worse x 1h  Family upset that bottom red  Family does NOT want flexiseal  Family wanting stat control of diarrhea and asking for anti-motility drug  Family possibly upset that home probiotic not restarted   A Patient is here for    ICD-9-CM ICD-10-CM   1. HCAP (healthcare-associated pneumonia) 6 J18.9    Patient is on  Anti-infectives    Start     Dose/Rate Route Frequency Ordered Stop   08/25/14 2000  levofloxacin (LEVAQUIN) IVPB 750 mg     750 mg100 mL/hr over 90 Minutes Intravenous Every 24 hours 08/25/14 1352     08/25/14 0000  vancomycin (VANCOCIN) 500 mg in sodium chloride 0.9 % 100 mL IVPB     500 mg100 mL/hr over 60 Minutes Intravenous Every 12 hours 08/24/14 1613     08/24/14 2200  aztreonam (AZACTAM) 1 g in dextrose 5 % 50 mL IVPB     1 g100 mL/hr over 30 Minutes Intravenous 3 times per day 08/24/14 1613     08/24/14 2000  levofloxacin (LEVAQUIN) IVPB 750 mg  Status:  Discontinued     750 mg100 mL/hr over 90 Minutes Intravenous Every 48 hours 08/24/14 1839 08/25/14 1352   08/24/14 1545  aztreonam (AZACTAM) 2 g in dextrose 5 % 50 mL IVPB     2 g100 mL/hr over 30 Minutes Intravenous  Once 08/24/14 1531 08/24/14 1628   08/24/14 1545  vancomycin (VANCOCIN) IVPB 1000 mg/200 mL premix     1,000 mg200 mL/hr over 60 Minutes Intravenous  Once 08/24/14 1531 08/24/14 1704       Recent Labs Lab 08/24/14 1536 08/24/14 1820 08/25/14 0527  WBC 10.2 7.9 8.4    Recent Labs Lab 08/24/14 1536 08/25/14 0527  CREATININE 0.60 0.51    A Diarrhea Family crisis about symptoms  P Hold tube feeds and change protonix for palliative control of diarrhea Restart home probiotic per family wish Possible infectious etiology  - check stool pcr for c diff and other pathogens Risk of  anti-motility drug and GI perf warned to RD to pass on to family Start empiric flagyl tid  eMD available to talk to family if needed    Dr. Brand Males, M.D., [facility].C.P Pulmonary and Critical Care Medicine Staff Physician [place] [facility] Pager: [phone], If no answer or between  15:00h - 7:00h: call [phone]  08/25/2014 5:35 PM

## 2014-08-25 NOTE — Progress Notes (Signed)
Nutrition Brief Note  RD consulted for TF initiation and management.   Wt Readings from Last 15 Encounters:  08/25/14 147 lb 0.8 oz (66.7 kg)  08/14/14 141 lb (63.957 kg)  08/10/14 131 lb (59.421 kg)  07/24/14 142 lb 3.2 oz (64.501 kg)  06/04/14 138 lb 14.2 oz (3 kg)   80 year old with interstitial lung disease, presumed IPF, polymyalgia on chronic prednisone, chronic diastolic heart failure and severe dysphagia status post PEG presents with her third episode of HCAP in the last 6 weeks, with severe sepsis  Per H&P, PTA patient was receiving 1 can (237 ml) of Jevity 1.2 5 times daily. This provides 1422 kcal and 66 grams of protein.   Estimated Nutritional Needs: Kcal: 1500-1730 Protein: 75-85 grams Fluid: 1.5-1.7 L/day  Patient is currently ordered to received Jevity 1.2 @ 50 ml/hr; currently running at 40 ml/hr.  Will change TF regimen to Jevity 1.2 @ 55 ml/hr which will provide 1584 kcal, 73 grams of protein, and 1069 ml of water.   Labs and medications reviewed. RD to follow-up tomorrow for full assessment.    Pryor Ochoa RD, LDN Inpatient Clinical Dietitian Pager: [phone] After Hours Pager: [phone]

## 2014-08-26 LAB — CBC
HCT: 33 % — ABNORMAL LOW (ref 36.0–46.0)
Hemoglobin: 10.6 g/dL — ABNORMAL LOW (ref 12.0–15.0)
MCH: 30.1 pg (ref 26.0–34.0)
MCHC: 32.1 g/dL (ref 30.0–36.0)
MCV: 93.8 fL (ref 78.0–100.0)
PLATELETS: 124 10*3/uL — AB (ref 150–400)
RBC: 3.52 MIL/uL — ABNORMAL LOW (ref 3.87–5.11)
RDW: 21 % — ABNORMAL HIGH (ref 11.5–15.5)
WBC: 8 10*3/uL (ref 4.0–10.5)

## 2014-08-26 LAB — GLUCOSE, CAPILLARY
GLUCOSE-CAPILLARY: 138 mg/dL — AB (ref 70–99)
GLUCOSE-CAPILLARY: 115 mg/dL — AB (ref 70–99)
Glucose-Capillary: 106 mg/dL — ABNORMAL HIGH (ref 70–99)
Glucose-Capillary: 93 mg/dL (ref 70–99)

## 2014-08-26 LAB — BASIC METABOLIC PANEL
Anion gap: 12 (ref 5–15)
BUN: 12 mg/dL (ref 6–23)
CO2: 20 mEq/L (ref 19–32)
Calcium: 7.3 mg/dL — ABNORMAL LOW (ref 8.4–10.5)
Chloride: 103 mEq/L (ref 96–112)
Creatinine, Ser: 0.52 mg/dL (ref 0.50–1.10)
GFR calc Af Amer: >90 mL/min (ref >90)
GFR, EST NON AFRICAN AMERICAN: 83 mL/min — AB (ref >90)
Glucose, Bld: 125 mg/dL — ABNORMAL HIGH (ref 70–99)
POTASSIUM: 3.4 meq/L — AB (ref 3.7–5.3)
SODIUM: 135 meq/L — AB (ref 137–147)

## 2014-08-26 LAB — CULTURE, GROUP A STREP

## 2014-08-26 LAB — VANCOMYCIN, TROUGH: VANCOMYCIN TR: 10.9 ug/mL (ref 10.0–20.0)

## 2014-08-26 LAB — MAGNESIUM: MAGNESIUM: 1.9 mg/dL (ref 1.5–2.5)

## 2014-08-26 LAB — CLOSTRIDIUM DIFFICILE BY PCR: CDIFFPCR: NEGATIVE

## 2014-08-26 LAB — PHOSPHORUS: PHOSPHORUS: 1.7 mg/dL — AB (ref 2.3–4.6)

## 2014-08-26 MED ORDER — VANCOMYCIN HCL IN DEXTROSE 750-5 MG/150ML-% IV SOLN
750.0000 mg | Freq: Two times a day (BID) | INTRAVENOUS | Status: DC
  Administered 2014-08-26 – 2014-08-27 (×3): 750 mg via INTRAVENOUS
  Filled 2014-08-26 (×4): qty 150

## 2014-08-26 NOTE — Progress Notes (Signed)
E-link, MD called due to noticed ST changes on ECG, will complete an EKG.  Pt denies any chest pain, B/P unchanged.  Occasional PVC's noted.  Pt has daughters at bedside.  Explained to pt daughters MD will be informed although my shift is ending, day RN will continue pt care.

## 2014-08-26 NOTE — Progress Notes (Signed)
Per Dr. Janit Pagan note, writer has paged patient's doctor of today, Byrum, to report 12-lead results.

## 2014-08-26 NOTE — Progress Notes (Signed)
Dr. Lamonte Sakai returned call and was told of 12-lead results. He will round this morning.

## 2014-08-26 NOTE — Progress Notes (Signed)
INITIAL NUTRITION ASSESSMENT  DOCUMENTATION CODES Per approved criteria  -Non-severe (moderate) malnutrition in the context of chronic illness  Pt meets criteria for moderate MALNUTRITION in the context of chronic illness as evidenced by mild fat and moderate muscle depletion, <75% energy intake for >1 month and fluid accumulation.  INTERVENTION: -Per MD, recommend Jevity 1.2 at trickle feeds of 10 ml/hr for 24 hours, then advance by 10 ml every 6 hours to goal rate of 55 ml/hr as tolerated to provide 1584 kcal (100% est kcal needs), 73 gram protein (86% est protein needs) -RD to monitor tolerance of TF and C.Diff results -RD to continue to monitor daily  NUTRITION DIAGNOSIS: Inadequate oral intake related to inability to eat as evidenced by NPO status.   Goal: Pt to meet >/= 90% of their estimated nutrition needs   Monitor:  TF regimen & tolerance, weight, labs, I/O's   Reason for Assessment: Consult for tube feeding initiation and management  Admitting Dx: <principal problem not specified>  ASSESSMENT: 80 year old with interstitial lung disease, presumed IPF, polymyalgia on chronic prednisone, chronic diastolic heart failure and severe dysphagia status post PEG presents with her third episode of HCAP in the last 6 weeks, with severe sepsis.  Pt recently discharged on 11/13.  11/28 Daughters spoke with on-call RD regarding diarrhea and TF. TF was stopped d/t diarrhea. C.Diff pending. Per H&P, PTA patient was receiving 1 can (237 ml) of Jevity 1.2, 5 times daily. This provides 1422 kcal and 66 grams of protein.   11/29 Spoke with Kathe Becton, the pt's daughters regarding TF plan for pt. Pt's daughters request daily monitoring of pt.  Explained to family the plan per MD of trickle feeds of Jevity 1.2 pending c.diff results and chest scan.  Per MD note, pt with possible chronic aspiration despite PEG d/t hiatal hernia. Possible consideration of MBS. Per RN, MD may start trickle  feeds at 20 ml/hr, would recommend restarting TF at a lower rate of 10 ml/hr, family concerned for pt's tolerance of continuous TF.  RD to follow-up in am to evaluate patient and tolerance of trickle feeds (if started).  Nutrition Focused Physical Exam:  Subcutaneous Fat:  Orbital Region: WNL Upper Arm Region: mild depletion Thoracic and Lumbar Region: NA  Muscle:  Temple Region: mild depletion Clavicle Bone Region: moderate depletion Clavicle and Acromion Bone Region: moderate depletion Scapular Bone Region: WNL Dorsal Hand: mild depletion Patellar Region: WNL Anterior Thigh Region: WNL Posterior Calf Region: WNL  Edema:  RLE, LLE, +1 RUE, +1 LUE, +1 perineal, +1 sacral edema  Labs reviewed: Low Na & K, Phos Glucose 125  Height: Ht Readings from Last 1 Encounters:  08/24/14 5\' 3"  (1.6 m)    Weight: Wt Readings from Last 1 Encounters:  08/26/14 149 lb 11.1 oz (67.9 kg)    Ideal Body Weight: 115 lb   % Ideal Body Weight: 130%  Wt Readings from Last 10 Encounters:  08/26/14 149 lb 11.1 oz (67.9 kg)  08/14/14 141 lb (63.957 kg)  08/10/14 131 lb (59.421 kg)  07/24/14 142 lb 3.2 oz (64.501 kg)  06/04/14 138 lb 14.2 oz (63 kg)    Usual Body Weight: 140 lb  % Usual Body Weight: 106%  BMI:  Body mass index is 26.52 kg/(m^2).  Estimated Nutritional Needs: Kcal: 1500-1700 Protein: 85-95g Fluid: 1.5L/day  Skin: Stage II Pressure ulcer on buttocks, open necrotic toe wound  Diet Order: Diet NPO time specified  EDUCATION NEEDS: -Education needs addressed   Intake/Output  Summary (Last 24 hours) at 08/26/14 0947 Last data filed at 08/26/14 0601  Gross per 24 hour  Intake   1600 ml  Output    600 ml  Net   1000 ml    Last BM: 11/28 -dairrhea  Labs:   Recent Labs Lab 08/24/14 1536 08/25/14 0527 08/26/14 0419  NA 136* 134* 135*  K 5.2 4.0 3.4*  CL 97 100 103  CO2 21 22 20   BUN 20 15 12   CREATININE 0.60 0.51 0.52  CALCIUM 8.3* 7.5* 7.3*  MG  --   1.8 1.9  PHOS  --  2.6 1.7*  GLUCOSE 176* 203* 125*    CBG (last 3)   Recent Labs  08/26/14 0009 08/26/14 0315 08/26/14 0909  GLUCAP 93 106* 138*    Scheduled Meds: . antiseptic oral rinse  7 mL Mouth Rinse q12n4p  . aspirin  81 mg Per Tube Daily  . aztreonam  1 g Intravenous 3 times per day  . chlorhexidine  15 mL Mouth Rinse BID  . free water  120 mL Per Tube QID  . heparin  5,000 Units Subcutaneous 3 times per day  . insulin aspart  2-6 Units Subcutaneous 6 times per day  . levofloxacin (LEVAQUIN) IV  750 mg Intravenous Q24H  . prednisoLONE  4.5 mg Per Tube QAC breakfast  . ranitidine  150 mg Per Tube QHS  . saccharomyces boulardii  250 mg Oral BID  . vancomycin  500 mg Intravenous Q12H    Continuous Infusions: . dextrose 5 % and 0.9% NaCl 50 mL/hr at 08/25/14 2311    Past Medical History  Diagnosis Date  . Arthritis   . Polymyalgia   . Acid reflux   . Immune deficiency disorder     autoimmune disorder - polymyalgia - on prednisone  . Breast cancer     Treated with lumpectomy and radiation  . Giardia   . Pinworms   . History of hiatal hernia   . Pneumonia   . Dementia   . CHF (congestive heart failure)   . Dysrhythmia     Past Surgical History  Procedure Laterality Date  . Breast lumpectomy    . Partial hysterectomy    . Tonsillectomy    . Tee without cardioversion N/A 07/20/2014    Procedure: TRANSESOPHAGEAL ECHOCARDIOGRAM (TEE);  Surgeon: Candee Furbish, MD;  Location: [facility] ENDOSCOPY;  Service: Cardiovascular;  Laterality: N/A;  . Abdominal hysterectomy      Clayton Bibles, MS, RD, LDN Pager: [phone] After Hours Pager: [phone]

## 2014-08-26 NOTE — Progress Notes (Signed)
[place] for vancomycin/aztreonam/levofloxacin Indication: pneumonia and rule out sepsis   Allergies  Allergen Reactions  . Doxycycline Nausea And Vomiting  . Other Nausea And Vomiting and Other (See Comments)    Anesthesia makes her very sleepy and makes it hard for her to come out of it.   Marland Kitchen Penicillins Hives and Nausea And Vomiting  . Sulfa Antibiotics Nausea And Vomiting    Patient Measurements: Height: 5\' 3"  (160 cm) Weight: 149 lb 11.1 oz (67.9 kg) IBW/kg (Calculated) : 52.4  Vital Signs: Temp: 98.2 F (36.8 C) (11/29 0400) Temp Source: Oral (11/29 0400) BP: 122/54 mmHg (11/29 0400) Pulse Rate: 98 (11/29 0400) Intake/Output from previous day: 11/28 0701 - 11/29 0700 In: 1600 [I.V.:1100; NG/GT:150; IV Piggyback:350] Out: 600 [Urine:600] Intake/Output from this shift:    Labs:  Recent Labs  08/24/14 1536 08/24/14 1820 08/25/14 0527 08/26/14 0419  WBC 10.2 7.9 8.4 8.0  HGB 11.4* 10.3* 10.0* 10.6*  PLT 165 150 135* 124*  CREATININE 0.60  --  0.51 0.52   Estimated Creatinine Clearance: 45.8 mL/min (by C-G formula based on Cr of 0.52).  Recent Labs  08/26/14 0940  [place] 10.9     Microbiology: Recent Results (from the past 720 hour(s))  Blood culture (routine x 2)     Status: None   Collection Time: 07/28/14 12:21 PM  Result Value Ref Range Status   Specimen Description BLOOD LEFT WRIST  4 ML IN [facility] BOTTLE  Final   Special Requests NONE  Final   Culture  Setup Time   Final    07/28/2014 20:11 Performed at [facility]    Culture   Final    NO GROWTH 5 DAYS Performed at [facility]    Report Status 08/03/2014 FINAL  Final  Blood culture (routine x 2)     Status: None   Collection Time: 07/28/14 12:49 PM  Result Value Ref Range Status   Specimen Description BLOOD BLOOD LEFT FOREARM  Final   Special Requests BOTTLES DRAWN AEROBIC AND ANAEROBIC 5ML  Final   Culture  Setup Time   Final   07/28/2014 20:10 Performed at [facility]    Culture   Final    NO GROWTH 5 DAYS Performed at [facility]    Report Status 08/03/2014 FINAL  Final  Urine culture     Status: None   Collection Time: 07/28/14  2:03 PM  Result Value Ref Range Status   Specimen Description URINE, CLEAN CATCH  Final   Special Requests NONE  Final   Culture  Setup Time   Final    07/29/2014 02:28 Performed at [place]   Final    30,000 COLONIES/ML Performed at [facility]    Culture   Final    STAPHYLOCOCCUS SPECIES (COAGULASE NEGATIVE) Note: RIFAMPIN AND GENTAMICIN SHOULD NOT BE USED AS SINGLE DRUGS FOR TREATMENT OF STAPH INFECTIONS. Performed at [facility]    Report Status 07/31/2014 FINAL  Final   Organism ID, Bacteria STAPHYLOCOCCUS SPECIES (COAGULASE NEGATIVE)  Final      Susceptibility   Staphylococcus species (coagulase negative) - MIC*    GENTAMICIN <=0.5 SENSITIVE Sensitive     LEVOFLOXACIN >=8 RESISTANT Resistant     NITROFURANTOIN <=16 SENSITIVE Sensitive     OXACILLIN >=4 RESISTANT Resistant     PENICILLIN >=0.5 RESISTANT Resistant     RIFAMPIN <=0.5 SENSITIVE Sensitive  TRIMETH/SULFA <=10 SENSITIVE Sensitive     VANCOMYCIN 1 SENSITIVE Sensitive     TETRACYCLINE <=1 SENSITIVE Sensitive     * STAPHYLOCOCCUS SPECIES (COAGULASE NEGATIVE)  MRSA PCR Screening     Status: None   Collection Time: 07/28/14  3:52 PM  Result Value Ref Range Status   MRSA by PCR NEGATIVE NEGATIVE Final    Comment:        The GeneXpert MRSA Assay (FDA approved for NASAL specimens only), is one component of a comprehensive MRSA colonization surveillance program. It is not intended to diagnose MRSA infection nor to guide or monitor treatment for MRSA infections.  Culture, expectorated sputum-assessment     Status: None   Collection Time: 07/28/14  4:21 PM  Result Value Ref Range Status   Specimen Description SPUTUM  Final   Special  Requests Immunocompromised  Final   Sputum evaluation   Final    THIS SPECIMEN IS ACCEPTABLE. RESPIRATORY CULTURE REPORT TO FOLLOW.   Report Status 07/28/2014 FINAL  Final  Culture, respiratory (NON-Expectorated)     Status: None   Collection Time: 07/28/14  4:21 PM  Result Value Ref Range Status   Specimen Description SPUTUM  Final   Special Requests NONE  Final   Gram Stain   Final    NO WBC SEEN FEW SQUAMOUS EPITHELIAL CELLS PRESENT MODERATE GRAM POSITIVE RODS FEW GRAM NEGATIVE RODS FEW GRAM POSITIVE COCCI IN PAIRS    Culture   Final    NORMAL OROPHARYNGEAL FLORA Performed at [facility]    Report Status 07/31/2014 FINAL  Final  Ova and parasite examination     Status: None   Collection Time: 07/29/14  7:28 PM  Result Value Ref Range Status   Specimen Description STOOL  Final   Special Requests NONE  Final   Ova and parasites   Final    NO OVA OR PARASITES SEEN NO STRONGYLOIDES STERCORALIS SEEN Performed at [facility]    Report Status 07/31/2014 FINAL  Final  Clostridium Difficile by PCR     Status: None   Collection Time: 08/09/14 11:51 PM  Result Value Ref Range Status   C difficile by pcr NEGATIVE NEGATIVE Final    Comment: Performed at [facility]  Blood Culture (routine x 2)     Status: None (Preliminary result)   Collection Time: 08/24/14  3:36 PM  Result Value Ref Range Status   Specimen Description BLOOD BLOOD RIGHT FOREARM  Final   Special Requests BOTTLES DRAWN AEROBIC AND ANAEROBIC 5 ML  Final   Culture  Setup Time   Final    08/24/2014 21:19 Performed at [facility]    Culture   Final           BLOOD CULTURE RECEIVED NO GROWTH TO DATE CULTURE WILL BE HELD FOR 5 DAYS BEFORE ISSUING A FINAL NEGATIVE REPORT Performed at [facility]    Report Status PENDING  Incomplete  Urine culture     Status: None   Collection Time: 08/24/14  4:38 PM  Result Value Ref Range Status   Specimen Description URINE, CLEAN CATCH   Final   Special Requests NONE  Final   Culture  Setup Time   Final    08/24/2014 21:39 Performed at [place] Performed at [facility]   Final   Culture NO GROWTH Performed at [facility]   Final   Report Status 08/25/2014 FINAL  Final  Blood Culture (routine x 2)     Status: None (Preliminary result)   Collection Time: 08/24/14  4:55 PM  Result Value Ref Range Status   Specimen Description BLOOD LEFT HAND  Final   Special Requests BOTTLES DRAWN AEROBIC AND ANAEROBIC 5 ML  Final   Culture  Setup Time   Final    08/24/2014 21:21 Performed at [facility]    Culture   Final           BLOOD CULTURE RECEIVED NO GROWTH TO DATE CULTURE WILL BE HELD FOR 5 DAYS BEFORE ISSUING A FINAL NEGATIVE REPORT Performed at [facility]    Report Status PENDING  Incomplete  MRSA PCR Screening     Status: None   Collection Time: 08/24/14  9:35 PM  Result Value Ref Range Status   MRSA by PCR NEGATIVE NEGATIVE Final    Comment:        The GeneXpert MRSA Assay (FDA approved for NASAL specimens only), is one component of a comprehensive MRSA colonization surveillance program. It is not intended to diagnose MRSA infection nor to guide or monitor treatment for MRSA infections.   Rapid strep screen     Status: None   Collection Time: 08/24/14 10:00 PM  Result Value Ref Range Status   Streptococcus, Group A Screen (Direct) NEGATIVE NEGATIVE Final    Comment: (NOTE) A Rapid Antigen test may result negative if the antigen level in the sample is below the detection level of this test. The FDA has not cleared this test as a stand-alone test therefore the rapid antigen negative result has reflexed to a Group A Strep culture.     Medical History: Past Medical History  Diagnosis Date  . Arthritis   . Polymyalgia   . Acid reflux   . Immune deficiency disorder     autoimmune disorder - polymyalgia - on prednisone  . Breast  cancer     Treated with lumpectomy and radiation  . Giardia   . Pinworms   . History of hiatal hernia   . Pneumonia   . Dementia   . CHF (congestive heart failure)   . Dysrhythmia    Assessment: 68 YOF presents with shortness of breath and altered mental status.  Family states she has had weakness and temp to 100.4 x 2 days (Fever in ED).  Complains of sore throat and facial rash. She has been given vancomycin levofloxacin and aztreonam x 1 dose in ED.  She has PCN allergy of hives and N/V - tolerated cefepime on previous admission.  She is being treated for recurrent pneumonia.    Tmax: AF WBCs: WNL Renal: SCr WNL for normalized CrCl = 75ml/min (using Scr = 0.8 for age) LA: 6.02 -> 3.41  11/27 blood: ngtd 11/27 urine: ng F 11/28 c. Diff: sent   Drug level / dose changes info: 11/29: VT 1000: 10.5 on vanc 500mg  Q12H increase to 750mg  Q12H 11/28: increase levaquin to 750mg  Q24H for renal function  Goal of Therapy:  Vancomycin trough level 15-20 mcg/ml  Plan:   Increase Vancomycin to 750mg  IV Q12H for low trough level  Continue Aztreonam 1gm IV q8h  Continue Levofloxacin 750mg  IV q24h for renal function  F/u culture results, renal function clinical course  Kizzie Furnish, PharmD Pager: [phone] 08/26/2014 10:37 AM

## 2014-08-26 NOTE — Progress Notes (Signed)
eLink Physician-Brief Progress Note Patient Name: UMA JERDE DOB: [birth year] MRN: [id number]   Date of Service  08/26/2014  HPI/Events of Note  Rn called concern St changes on tele, ECG to be done   eICU Interventions  Shift in [place] ending, no ECG done as of now, instructed Rn to contact [phone] to review as my shift is ending. WIll sign out to [facility] MD also No symptoms, hemodynamic changes     Intervention Category Major Interventions: Arrhythmia - evaluation and management  Brandon Wiechman J. 08/26/2014, 6:58 AM

## 2014-08-26 NOTE — Progress Notes (Signed)
Writer spoke with Dr. Lamonte Sakai concerning family's questions regarding Lasix medication. No new orders received. Explained to pt's daughter the reasoning behind not restarting pt's home Lasix dose at this time.

## 2014-08-26 NOTE — Progress Notes (Signed)
[facility]  Patient Status: Active (receiving services up to time of hospitalization)  [facility] is providing the following services: RN, PT, OT, ST and HHA  ([place] patient)  If patient discharges after hours, please call [phone].   Martha Mcbride 08/26/2014, 8:31 AM

## 2014-08-26 NOTE — Progress Notes (Signed)
Writer paged and spoke with dietitian; alerted of negative C.Diff. Dietitian said that tube feeding will wait until chest x-ray results, scheduled for 0600 08/27/14, before starting tube feeding again.

## 2014-08-26 NOTE — Progress Notes (Signed)
C.Diff. Negative. D/C enteric precautions.

## 2014-08-26 NOTE — Progress Notes (Signed)
Name: Martha Mcbride MRN: [id number] DOB: 01/07/1925    ADMISSION DATE:  08/24/2014  REFERRING MD :  EDP  CHIEF COMPLAINT:  Generalized weakness, fever  BRIEF PATIENT DESCRIPTION: 80 year old with interstitial lung disease, presumed IPF, polymyalgia on chronic prednisone, chronic diastolic heart failure and severe dysphagia status post PEG presents with her third episode of HCAP in the last 6 weeks, with severe sepsis, lactate of 6 She also has a new malar  rash  SIGNIFICANT EVENTS  admission 10-27/15 -health care associated PNA, encephalopathy, and diastolic HF exacerbation.  Admitted 07/28/14 with fever 103, confusion,productive cough. WBC at 12, Lactic acid at 3.5. Chest x ray -diffuse bilateral pulmonary interstitial infiltrates.& eosinophilia (AEC 1800 on 10/22 which came down to 600 on 10/31). Diarrhea is resolved & felt unlikely to be parasitic by ID - stool O & P neg.  STUDIES:  07/30/14 CT: Evidence of emphysema with traction bronchiectasis in multiple areas of interstitial fibrosis. This appearance is consistent with underlying usual interstitial pneumonitis. There are areas of patchy consolidation in both lower lobes as wellas to a lesser extent in the upper lobes. There also moderate pleural effusions. The areas of consolidation are felt to most likely represent superimposed pneumonia. There may be a degree of superimposed congestive heart failure as well given the pleural effusions.5 mm nodular opacity in the posterior segment of the right upper lobe. Areas of atherosclerotic change and cardiac valvular calcification. Areas of relatively mild adenopathy. Cholelithiasis.  04/2012 CT abd >>large hiatal hernia . Prominent interstitial lung markings suggest chronic changes  06/2014 TEE mild MS, mod AS, nml LVfn Ova and parasite stool neg.  Strongyloides antibody pending.  Quantiferon Tb neg   SUBJECTIVE:  Feels a bit stronger today Began to have frequent diarrhea pm 11/28,  C diff checked and pending, TF held  VITAL SIGNS: Temp:  [97.7 F (36.5 C)-98.4 F (36.9 C)] 98.2 F (36.8 C) (11/29 0400) Pulse Rate:  [89-101] 98 (11/29 0400) Resp:  [22-29] 23 (11/29 0400) BP: (119-140)/(51-68) 122/54 mmHg (11/29 0400) SpO2:  [95 %-98 %] 96 % (11/29 0400) Weight:  [67.9 kg (149 lb 11.1 oz)] 67.9 kg (149 lb 11.1 oz) (11/29 0400)  PHYSICAL EXAMINATION: Gen. Chronically ill, elderly woman in no distress, normal affect ENT - no lesions, no post nasal drip, red pharynx, malar rash has resolved, decreased hearing Lungs: no use of accessory muscles, bibasal rales , no rhonchi  Cardiovascular: Rhythm regular, heart sounds  normal, no murmurs, no peripheral edema Abdomen: soft and non-tender, BS normal. Musculoskeletal: No deformities, no cyanosis or clubbing Neuro:  alert, non focal Skin:  Warm, no rash seen    Recent Labs Lab 08/24/14 1536 08/25/14 0527 08/26/14 0419  NA 136* 134* 135*  K 5.2 4.0 3.4*  CL 97 100 103  CO2 _0 BUN _1 CREATININE 0.60 0.51 0.52  GLUCOSE 176* 203* 125*    Recent Labs Lab 08/24/14 1820 08/25/14 0527 08/26/14 0419  HGB 10.3* 10.0* 10.6*  HCT 30.9* 30.5* 33.0*  WBC 7.9 8.4 8.0  PLT 150 135* 124*   Dg Chest Port 1 View  08/25/2014   CLINICAL DATA:  Acute onset of shortness of breath. Follow-up study.  EXAM: PORTABLE CHEST - 1 VIEW  COMPARISON:  Chest radiograph performed 08/24/2014  FINDINGS: There has been interval redistribution of the patient's airspace opacities, now seen in a perihilar distribution. This raises suspicion for pulmonary edema, although multifocal pneumonia might have a similar appearance. Small  bilateral pleural effusions are suspected. No pneumothorax is seen.  The cardiomediastinal silhouette is mildly enlarged. No acute osseous abnormalities are identified.  IMPRESSION: 1. Interval redistribution of airspace opacities, now seen in a perihilar distribution. This raises suspicion for pulmonary  edema, though multifocal pneumonia might have a similar appearance. Suspect small bilateral pleural effusions. 2. Mild cardiomegaly.   Electronically Signed   By: Garald Balding M.D.   On: 08/25/2014 06:43   Dg Chest Port 1 View  08/24/2014   CLINICAL DATA:  Shortness of breath, fevers, chills  EXAM: PORTABLE CHEST - 1 VIEW  COMPARISON:  CT chest dated 07/30/2014  FINDINGS: Right upper lobe opacity, suspicious for pneumonia.  Underlying chronic interstitial markings/ fibrosis. No pleural effusion or pneumothorax.  Cardiomegaly.  IMPRESSION: Right upper lobe opacity, suspicious for pneumonia.   Electronically Signed   By: Julian Hy M.D.   On: 08/24/2014 15:58    ASSESSMENT / PLAN:  Healthcare associated pneumonia -recurrent Suspected chronic aspiration Suspected superimposed cardiogenic pulm edema Underlying interstitial lung disease, presumed IPF New malar rash, resolved 11/29  Differential diagnosis here includes aspiration pneumonia, cardiogenic edema. Consider also auto-immune pneumonitis superimposed on her underlying ILD Use broad-spectrum antibiotics-aztreonam, vanc and Levaquin -penicillin allergy noted ESR, ANA screen and CCP pending -although would not rush to diagnose collagenous vascular disease in her age group. Suspect instead that this is due to chronic aspiration  Severe sepsis - responded to fluids Holding metoprolol and Lasix  Dysphagia- keep nothing by mouth for now Restart trickle TF on 11/29, held due to diarrhea Likely still aspirating, in spite of her PEG due to hiatal hernia, consider a modified barium study with contrast injected into the PEG to look for reflux  Polymyalgia- Consider stress dose steroids, if remains hypotensive Otherwise can resume 15 mg prednisolone daily (was being slowly tapered, last dose at home was 15m)  Diarrhea - likely due to combo abx, TF's. C diff is pending, will start imodium once negative.   RBaltazar Apo MD,  PhD 08/26/2014, 9:52 AM [facility] [phone] if no answer [phone]

## 2014-08-27 ENCOUNTER — Inpatient Hospital Stay (HOSPITAL_COMMUNITY): Payer: Medicare Other

## 2014-08-27 DIAGNOSIS — T17800S Unspecified foreign body in other parts of respiratory tract causing asphyxiation, sequela: Secondary | ICD-10-CM

## 2014-08-27 DIAGNOSIS — J9601 Acute respiratory failure with hypoxia: Secondary | ICD-10-CM

## 2014-08-27 LAB — GLUCOSE, CAPILLARY
GLUCOSE-CAPILLARY: 103 mg/dL — AB (ref 70–99)
GLUCOSE-CAPILLARY: 111 mg/dL — AB (ref 70–99)
GLUCOSE-CAPILLARY: 119 mg/dL — AB (ref 70–99)
GLUCOSE-CAPILLARY: 87 mg/dL (ref 70–99)
Glucose-Capillary: 94 mg/dL (ref 70–99)
Glucose-Capillary: 128 mg/dL — ABNORMAL HIGH (ref 70–99)

## 2014-08-27 LAB — BASIC METABOLIC PANEL
ANION GAP: 10 (ref 5–15)
BUN: 10 mg/dL (ref 6–23)
CALCIUM: 6.9 mg/dL — AB (ref 8.4–10.5)
CO2: 19 meq/L (ref 19–32)
Chloride: 103 mEq/L (ref 96–112)
Creatinine, Ser: 0.46 mg/dL — ABNORMAL LOW (ref 0.50–1.10)
GFR calc Af Amer: >90 mL/min (ref >90)
GFR calc non Af Amer: 87 mL/min — ABNORMAL LOW (ref >90)
Glucose, Bld: 95 mg/dL (ref 70–99)
POTASSIUM: 3.8 meq/L (ref 3.7–5.3)
SODIUM: 132 meq/L — AB (ref 137–147)

## 2014-08-27 LAB — ANA: ANA: POSITIVE — AB

## 2014-08-27 LAB — PATHOLOGIST SMEAR REVIEW

## 2014-08-27 LAB — ANTI-NUCLEAR AB-TITER (ANA TITER): ANA Titer 1: 1:40 {titer} — ABNORMAL HIGH

## 2014-08-27 MED ORDER — CALCIUM POLYCARBOPHIL 625 MG PO TABS
625.0000 mg | ORAL_TABLET | Freq: Every day | ORAL | Status: DC
  Administered 2014-08-27 – 2014-08-30 (×4): 625 mg
  Filled 2014-08-27 (×5): qty 1

## 2014-08-27 MED ORDER — METOPROLOL TARTRATE 25 MG/10 ML ORAL SUSPENSION
6.2500 mg | Freq: Two times a day (BID) | ORAL | Status: DC
  Administered 2014-08-27 – 2014-09-05 (×19): 6.25 mg
  Filled 2014-08-27 (×22): qty 2.5

## 2014-08-27 MED ORDER — VITAMINS A & D EX OINT
TOPICAL_OINTMENT | CUTANEOUS | Status: AC
  Filled 2014-08-27: qty 5

## 2014-08-27 MED ORDER — JEVITY 1.2 CAL PO LIQD
1000.0000 mL | ORAL | Status: DC
  Administered 2014-08-27: 1000 mL

## 2014-08-27 MED ORDER — LOPERAMIDE HCL 1 MG/5ML PO LIQD
2.0000 mg | ORAL | Status: DC | PRN
  Administered 2014-08-27 – 2014-09-04 (×11): 2 mg via ORAL
  Filled 2014-08-27 (×16): qty 10

## 2014-08-27 NOTE — Care Management Note (Signed)
    Page 1 of 2   08/27/2014     12:19:25 PM CARE MANAGEMENT NOTE 08/27/2014  Patient:  AZIZA, STUCKERT   Account Number:  [id number]  Date Initiated:  08/27/2014  Documentation initiated by:  DAVIS,RHONDA  Subjective/Objective Assessment:   80 year old with interstitial lung disease, presumed IPF, polymyalgia on chronic prednisone, chronic diastolic heart failure and severe dysphagia status post PEG presents with her third episode of HCAP in the last 6 weeks, with severe sepsi     Action/Plan:   lives with adult children who are her care takers   Anticipated DC Date:  08/30/2014   Anticipated DC Plan:  HOME/SELF CARE  In-house referral  NA      DC Planning Services  NA      [facility] Choice  NA   Choice offered to / List presented to:  NA   DME arranged  NA      DME agency  [place] arranged  NA      [place].   Status of service:  In process, will continue to follow Medicare Important Message given?   (If response is "NO", the following Medicare IM given date fields will be blank) Date Medicare IM given:   Medicare IM given by:   Date Additional Medicare IM given:   Additional Medicare IM given by:    Discharge Disposition:    Per UR Regulation:  Reviewed for med. necessity/level of care/duration of stay  If discussed at [place] of Stay Meetings, dates discussed:    Comments:  [id number]/Rhonda Rosana Hoes, RN, BSN, CCM Chart reviewed. Discharge needs and patient's stay to be reviewed and followed by case manager. Chart note for progression of stay: lungs have a few crackles, she is in good spirits today I think we are dealing with recurrent aspiration Will plan for Upper GI through PEG, have discussed with Dr. Graylon Good with radiology who recommends this to evaluate for esophageal reflux. Will consult PT, start tube feedings after the upper GI

## 2014-08-27 NOTE — Progress Notes (Signed)
NUTRITION FOLLOW UP  Intervention:   -Initiated Jevity 1.2 at trickle feeds of 10 ml/hr for 24 hours, then advance by 10 ml every 6 hours to goal rate of 55 ml/hr as tolerated to provide 1584 kcal, 73 gram protein -FiberCon fiber supplement once daily -Imodium 2mg  daily  PRN -RD to monitor daily  Nutrition Dx:   Inadequate oral intake related to inability to eat as evidenced by NPO status; ongoing  Goal:   TF to meet >/= 90% of their estimated nutrition needs; progressing  Monitor:   TF tolerance, GI profile, total protein/energy intake, labs, weights  Assessment:   11/29: Pt recently discharged on 11/13.  11/28 Daughters spoke with on-call RD regarding diarrhea and TF. TF was stopped d/t diarrhea. C.Diff pending. Per H&P, PTA patient was receiving 1 can (237 ml) of Jevity 1.2, 5 times daily. This provides 1422 kcal and 66 grams of protein.   11/29 Spoke with Kathe Becton, the pt's daughters regarding TF plan for pt. Pt's daughters request daily monitoring of pt.  Explained to family the plan per MD of trickle feeds of Jevity 1.2 pending c.diff results and chest scan.  Per MD note, pt with possible chronic aspiration despite PEG d/t hiatal hernia. Possible consideration of MBS. Per RN, MD may start trickle feeds at 20 ml/hr, would recommend restarting TF at a lower rate of 10 ml/hr, family concerned for pt's tolerance of continuous TF.  RD to follow-up in am to evaluate patient and tolerance of trickle feeds (if started).  11/30: -Pt negative for C.diff -RD received consult for enteral nutrition initiation, and request for additional fiber additives -Discussed TF with patient and pt's daughter. -Plan to initiate trickle feeds for 24 hour, and to advance as tolerated. Discussed fiber supplements available with pharmacy; PharmD noted available form in tablet is able to be crushed and administered via PEG.  -RD will order Fibercon once daily and increase to recommended dosage of  two tablets daily as needed   Height: Ht Readings from Last 1 Encounters:  08/24/14 5\' 3"  (1.6 m)    Weight Status:   Wt Readings from Last 1 Encounters:  08/27/14 144 lb 2.9 oz (65.4 kg)    Re-estimated needs:  Kcal: 1500-1700 Protein: 85-95g Fluid: 1.5L/day  Skin: Stage II Pressure ulcer on buttocks, open necrotic toe wound   Diet Order: Diet NPO time specified   Intake/Output Summary (Last 24 hours) at 08/27/14 1135 Last data filed at 08/27/14 1000  Gross per 24 hour  Intake   2020 ml  Output    225 ml  Net   1795 ml    Last BM: 11/28   Labs:   Recent Labs Lab 08/25/14 0527 08/26/14 0419 08/27/14 0345  NA 134* 135* 132*  K 4.0 3.4* 3.8  CL 100 103 103  CO2 22 20 19   BUN 15 12 10   CREATININE 0.51 0.52 0.46*  CALCIUM 7.5* 7.3* 6.9*  MG 1.8 1.9  --   PHOS 2.6 1.7*  --   GLUCOSE 203* 125* 95    CBG (last 3)   Recent Labs  08/26/14 1213 08/26/14 1634 08/27/14 0048  GLUCAP 115* 119* 103*    Scheduled Meds: . antiseptic oral rinse  7 mL Mouth Rinse q12n4p  . aspirin  81 mg Per Tube Daily  . aztreonam  1 g Intravenous 3 times per day  . chlorhexidine  15 mL Mouth Rinse BID  . feeding supplement (JEVITY 1.2 CAL)  1,000 mL Per  Tube Q24H  . free water  120 mL Per Tube QID  . heparin  5,000 Units Subcutaneous 3 times per day  . insulin aspart  2-6 Units Subcutaneous 6 times per day  . levofloxacin (LEVAQUIN) IV  750 mg Intravenous Q24H  . metoprolol tartrate  6.25 mg Per Tube BID  . polycarbophil  625 mg Per Tube Daily  . prednisoLONE  4.5 mg Per Tube QAC breakfast  . ranitidine  150 mg Per Tube QHS  . saccharomyces boulardii  250 mg Oral BID  . vancomycin  750 mg Intravenous Q12H    Continuous Infusions: . dextrose 5 % and 0.9% NaCl 50 mL/hr at 08/26/14 [place] LDN Clinical Dietitian [phone]

## 2014-08-27 NOTE — Consult Note (Signed)
WOC wound consult note Reason for Consult:Reassessment of coccyx ulcer (improved), assessment of new ulceration on left buttock, second and third toes on right foot Wound type:Pressure, friction, arterial insufficiency (toes) Pressure Ulcer POA: Yes Measurement:Coccyx ulceration measures 2.5cm x 1cm x 1cm and tissue is 100% red and moist (no necrotic tissue).  New open area is noted at the left buttock measuring 1cm x 2cm oval with 0.2cm depth.  The tissue is pink and moist.  Two toes on the right foot (2nd and 3rd digits) are necrotic at the most distal points.  No erythema or warmth noted. Wound bed:  Drainage (amount, consistency, odor)  Periwound: Dressing procedure/placement/frequency: Family is using a topical wound care product (ENDIT) that is a locally produced product consisting primarily of zinc oxide and calamine lotion.  As the wounds have no necrotic tissue, I am fine with the use of this product-application several times a day following repositioning efforts and incontinence episodes is acceptable. I have stressed to the family member present that the best solution for for these areas is pressure redistribution, and have advised to turn patient side to side, limiting the amount of time she spends in the supine position or with the [facility] above a 30 degree angle.  When OOB in chair, patient is to sit on a pressure redistribution chair pad.  I have provided two pads in the past, and family member with her today knows nothing of this device. We will implement one today as she is expected to get up.  It is encouraged that she always sit on this when OOB in chair at home. [employer] nursing team will not follow, but will remain available to this patient, the nursing and medical team.  Please re-consult if needed. Thanks, Maudie Flakes, MSN, RN, [place], [place], [place] [phone])

## 2014-08-27 NOTE — Evaluation (Signed)
Physical Therapy Evaluation Patient Details Name: Martha Mcbride MRN: [id number] DOB: 07/17/1925 Today's Date: 08/27/2014   History of Present Illness  80 year old with interstitial lung disease, presumed IPF, polymyalgia on chronic prednisone, chronic diastolic heart failure and severe dysphagia status post PEG presents with her third episode of HCAP in the last 6 weeks, with severe sepsis, . Patient recently in hospital.  Clinical Impression  Patient actually mobilized better than expected. Patient's family present and reports that  Plans to return home. Patient will benefit from PT to address problems listed in note.    Follow Up Recommendations Home health PT;Supervision/Assistance - 24 hour    Equipment Recommendations  None recommended by PT    Recommendations for Other Services       Precautions / Restrictions Precautions Precautions: Fall Precaution Comments: NPO,PEG  needs  air cushions in chair.      Mobility  Bed Mobility Overal bed mobility: Needs Assistance Bed Mobility: Rolling;Sit to Sidelying Rolling: Mod assist   Supine to sit: Mod assist   Sit to sidelying: Mod assist General bed mobility comments: assist for legs onto bed.  Transfers Overall transfer level: Needs assistance Equipment used: Rolling walker (2 wheeled) Transfers: Sit to/from Stand Sit to Stand: Mod assist;+2 safety/equipment;From elevated surface         General transfer comment: x 2 from Bed, side steps  x 5 steps along bed.  Ambulation/Gait                Stairs            Wheelchair Mobility    Modified Rankin (Stroke Patients Only)       Balance Overall balance assessment: Needs assistance Sitting-balance support: Bilateral upper extremity supported;Feet supported Sitting balance-Leahy Scale: Fair                                       Pertinent Vitals/Pain Pain Assessment: Faces Faces Pain Scale: Hurts whole lot Pain Location:  rectum Pain Descriptors / Indicators: Burning;Grimacing;Guarding Pain Intervention(s): Limited activity within patient's tolerance;Repositioned;Monitored during session    Home Living Family/patient expects to be discharged to:: Private residence Living Arrangements: Children Available Help at Discharge: Family;Home health Type of Home: House       Home Layout: One level Home Equipment: Environmental consultant - 2 wheels;Cane - single point;Wheelchair - manual;Bedside commode      Prior Function Level of Independence: Needs assistance   Gait / Transfers Assistance Needed: needing Min assit for ambulation           Hand Dominance        Extremity/Trunk Assessment   Upper Extremity Assessment: Generalized weakness           Lower Extremity Assessment: Generalized weakness      Cervical / Trunk Assessment: Kyphotic  Communication   Communication: No difficulties  Cognition Arousal/Alertness: Awake/alert Behavior During Therapy: WFL for tasks assessed/performed Overall Cognitive Status: Within Functional Limits for tasks assessed                      General Comments      Exercises        Assessment/Plan    PT Assessment Patient needs continued PT services  PT Diagnosis Difficulty walking;Generalized weakness   PT Problem List Decreased strength;Decreased activity tolerance;Decreased balance;Decreased mobility;Decreased knowledge of use of DME;Cardiopulmonary status limiting activity  PT Treatment Interventions DME instruction;Gait training;Functional  mobility training;Therapeutic activities;Therapeutic exercise;Patient/family education;Balance training   PT Goals (Current goals can be found in the Care Plan section) Acute Rehab PT Goals Patient Stated Goal: to get stronger. home.  PT Goal Formulation: With patient/family Time For Goal Achievement: 09/10/14 Potential to Achieve Goals: Fair    Frequency Min 3X/week   Barriers to discharge         Co-evaluation               End of Session   Activity Tolerance: Patient limited by fatigue Patient left: in bed;with call bell/phone within reach;with family/visitor present Nurse Communication: Mobility status         Time: 7858-8502 PT Time Calculation (min) (ACUTE ONLY): 22 min   Charges:   PT Evaluation $Initial PT Evaluation Tier I: 1 Procedure PT Treatments $Therapeutic Activity: 8-22 mins   PT G Codes:          Martha Mcbride 08/27/2014, 3:24 PM Tresa Endo PT [phone]

## 2014-08-27 NOTE — Progress Notes (Signed)
Pt had a fairly ok night rested better.  Caregiver and daughter at bedside. Will continue to monitor and report. Dyann Ruddle, RN

## 2014-08-27 NOTE — Progress Notes (Signed)
Name: Martha Mcbride MRN: [id number] DOB: April 16, 1925    ADMISSION DATE:  08/24/2014  REFERRING MD :  EDP  CHIEF COMPLAINT:  Generalized weakness, fever  BRIEF PATIENT DESCRIPTION: 80 year old with interstitial lung disease, presumed IPF, polymyalgia on chronic prednisone, chronic diastolic heart failure and severe dysphagia status post PEG presents with her third episode of HCAP in the last 6 weeks, with severe sepsis, lactate of 6.  She also has a new malar  rash  SIGNIFICANT EVENTS  11/28  Frequent diarrhea, CDiff neg, TF held   STUDIES:  07/30/14 CT Chest >> Evidence of emphysema with traction bronchiectasis in multiple areas of interstitial fibrosis. Appearance is consistent with underlying usual interstitial pneumonitis. There are areas of patchy consolidation in both lower lobes as wellas to a lesser extent in the upper lobes. There also moderate pleural effusions. The areas of consolidation are felt to most likely represent superimposed pneumonia. There may be a degree of superimposed congestive heart failure as well given the pleural effusions .5 mm nodular opacity in the posterior segment of the right upper lobe. Areas of atherosclerotic change and cardiac valvular calcification. Areas of relatively mild adenopathy. Cholelithiasis.   Historical Data: 04/2012 CT abd >>large hiatal hernia . Prominent interstitial lung markings suggest chronic changes  06/2014 TEE mild MS, mod AS, nml LVfn 07/24/14  Admit for HCAP, encephalopathy, & dHF exacerbation.  07/28/14 Admit with fever 103, confusion,productive cough.  WBC 12, Lactic acid at 3.5. Chest x ray with diffuse bilateral pulmonary interstitial infiltrates & eosinophilia (AEC 1800 on 10/22 which came down to 600 on 10/31). Diarrhea is resolved & felt unlikely to be parasitic by ID - stool O & P neg. 07/29/14  Ova and parasite stool neg. Strongyloides antibody neg, Quantiferon Tb neg    SUBJECTIVE: Tmax 99.1, Reports no diarrhea  off TF.  Reluctant to get out of bed   VITAL SIGNS: Temp:  [97.9 F (36.6 C)-99.3 F (37.4 C)] 99.1 F (37.3 C) (11/30 0800) Pulse Rate:  [97-104] 101 (11/30 0800) Resp:  [22-33] 24 (11/30 0800) BP: (118-157)/(55-71) 120/61 mmHg (11/30 0800) SpO2:  [84 %-97 %] 94 % (11/30 0800) Weight:  [144 lb 2.9 oz (65.4 kg)] 144 lb 2.9 oz (65.4 kg) (11/30 0400)  PHYSICAL EXAMINATION: Gen. Chronically ill, elderly woman in no distress, normal affect ENT - no lesions, no post nasal drip, red pharynx, decreased hearing Lungs: no use of accessory muscles, bibasal rales   Cardiovascular: Rhythm regular, heart sounds  normal, no murmurs, no peripheral edema Abdomen: soft and non-tender, BS normal. Musculoskeletal: No deformities, no cyanosis or clubbing Neuro:  alert, non focal Skin:  Warm, no rash seen    Recent Labs Lab 08/25/14 0527 08/26/14 0419 08/27/14 0345  NA 134* 135* 132*  K 4.0 3.4* 3.8  CL 100 103 103  CO2 $Re'22 20 19  'mEy$ BUN $R'15 12 10  'hR$ CREATININE 0.51 0.52 0.46*  GLUCOSE 203* 125* 95    Recent Labs Lab 08/24/14 1820 08/25/14 0527 08/26/14 0419  HGB 10.3* 10.0* 10.6*  HCT 30.9* 30.5* 33.0*  WBC 7.9 8.4 8.0  PLT 150 135* 124*   Dg Chest Port 1 View  08/27/2014   CLINICAL DATA:  Follow-up infiltrates. History breast cancer. CHF and pneumonia.  EXAM: PORTABLE CHEST - 1 VIEW  COMPARISON:  08/25/2014  FINDINGS: Progression of airspace disease right upper lobe which may represent pneumonia or asymmetric edema.  Underlying chronic lung disease and interstitial edema. Superimposed interstitial edema pattern is present compared  with 08/24/2014. Small pleural effusions.  IMPRESSION: Progression of right upper lobe airspace disease which may represent pneumonia or asymmetric edema.  Interstitial fibrosis with superimposed interstitial infection versus edema.   Electronically Signed   By: Franchot Gallo M.D.   On: 08/27/2014 07:22    ASSESSMENT / PLAN:  Healthcare associated pneumonia  - recurrent Suspected chronic aspiration Suspected superimposed cardiogenic pulm edema Underlying interstitial lung disease, presumed IPF - ESR 45 New malar rash, resolved 11/29 Sore throat - prior to admission   Differential diagnosis here includes aspiration pneumonia, cardiogenic edema. Consider also auto-immune pneumonitis superimposed on her underlying ILD  Plan: Broad-spectrum antibiotics-aztreonam, vanc and Levaquin - penicillin allergy noted ANA screen and CCP pending - although would not rush to diagnose collagenous vascular disease in her age group. Suspect instead that this is due to chronic aspiration  Severe sepsis - responded to fluids, resolved.   Plan: Hold Lasix Resume home Metoprolol   Dysphagia - keep nothing by mouth for now.  Last SLP evaluation 11/3 notable for aspiration of nectar thick liquids, aspiration of secretions with barium without awareness. Hiatal Hernia   Plan: Restart trickle TF on 11/30, held due to diarrhea Likely still aspirating, in spite of her PEG due to hiatal hernia, consider further evaluation.  Last SLP notes reflected aspiration of barium without awareness.  ? If she could be re-evaluated for oral intake   Polymyalgia   Plan:  Consider stress dose steroids if developed hypotesnion Continue 4.5 mg QD pred, was being slowly tapered, last dose noted to be 4.5 mg QD per med rec  Diarrhea - likely due to combo abx, TF's.   Plan: PRN imodium  Ask nutrition to add fiber to TF formula Florastor   Rash - erythematous, diffuse on face, arms,circular, blanching. NOT on trunk or LE's.   Plan: Monitor  Consider steroid cream, although family denies new lotions, soaps perfume.   Will slow rate of vancomycin via pharmacy (doubt this rash is med related given distribution)  Sacral Decubitus - had prior to admission.   Dry Gangrene of R Toes   Plan: WOC following Monitor toes, no acute interventions.  Previously seen by [facility].    Deconditioning   Plan: PT Consult Mobilize, out of bed BID, pt has been reluctant due to sacral ulcer.   Family updated extensively at bedside. Keep in SDU overnight.    Noe Gens, NP-C [facility] Pgr: [phone] or [phone]  Attending:  I have seen and examined the patient with nurse practitioner/resident and agree with the note above.   On my exam her lungs have a few crackles, she is in good spirits today I think we are dealing with recurrent aspiration Will plan for Upper GI through PEG, have discussed with Dr. Graylon Good with radiology who recommends this to evaluate for esophageal reflux.  Will consult PT, start tube feedings after the upper GI  Roselie Awkward, MD [facility] Pager: [phone] Cell: [phone] If no response, call [phone]

## 2014-08-28 DIAGNOSIS — J690 Pneumonitis due to inhalation of food and vomit: Secondary | ICD-10-CM

## 2014-08-28 LAB — GLUCOSE, CAPILLARY
GLUCOSE-CAPILLARY: 77 mg/dL (ref 70–99)
Glucose-Capillary: 91 mg/dL (ref 70–99)
Glucose-Capillary: 82 mg/dL (ref 70–99)
Glucose-Capillary: 104 mg/dL — ABNORMAL HIGH (ref 70–99)
Glucose-Capillary: 90 mg/dL (ref 70–99)
Glucose-Capillary: 126 mg/dL — ABNORMAL HIGH (ref 70–99)
Glucose-Capillary: 167 mg/dL — ABNORMAL HIGH (ref 70–99)
Glucose-Capillary: 148 mg/dL — ABNORMAL HIGH (ref 70–99)

## 2014-08-28 LAB — CYCLIC CITRUL PEPTIDE ANTIBODY, IGG

## 2014-08-28 MED ORDER — FUROSEMIDE 10 MG/ML IJ SOLN
INTRAMUSCULAR | Status: AC
  Administered 2014-08-28: 20 mg
  Filled 2014-08-28: qty 2

## 2014-08-28 MED ORDER — PRO-STAT SUGAR FREE PO LIQD
30.0000 mL | Freq: Three times a day (TID) | ORAL | Status: DC
  Administered 2014-08-28 – 2014-08-29 (×3): 30 mL
  Filled 2014-08-28 (×6): qty 30

## 2014-08-28 MED ORDER — POTASSIUM CHLORIDE 20 MEQ/15ML (10%) PO SOLN
20.0000 meq | Freq: Once | ORAL | Status: AC
  Administered 2014-08-28: 20 meq
  Filled 2014-08-28: qty 15

## 2014-08-28 MED ORDER — JEVITY 1.2 CAL PO LIQD
120.0000 mL | Freq: Every day | ORAL | Status: DC
  Administered 2014-08-28 – 2014-08-29 (×5): 120 mL
  Filled 2014-08-28 (×2): qty 237

## 2014-08-28 MED ORDER — FUROSEMIDE 10 MG/ML IJ SOLN: 20.0000 mg | Freq: Once | INTRAMUSCULAR | Status: AC

## 2014-08-28 NOTE — Progress Notes (Addendum)
NUTRITION FOLLOW UP  Intervention:   -Transition to Jevity 1.2 bolus regimen of 120 ml five times daily to provide 720 kcal, 33 gram protein, 484 ml free H2O -Recommend Pro-Stat TID to provide 300 kcal, 45 gram protein -Regimen provides 1030 kcal (73% est kcal needs), 76 gram protein (95% est protein needs) -FiberCon fiber supplement once daily -RD to monitor daily   Nutrition Dx:   Inadequate oral intake related to inability to eat as evidenced by NPO status; ongoing  Goal:   TF to meet >/= 90% of their estimated nutrition needs; progressing  Monitor:   TF tolerance, GI profile, total protein/energy intake, labs, weights  Assessment:   11/29: Pt recently discharged on 11/13.  11/28 Daughters spoke with on-call RD regarding diarrhea and TF. TF was stopped d/t diarrhea. C.Diff pending. Per H&P, PTA patient was receiving 1 can (237 ml) of Jevity 1.2, 5 times daily. This provides 1422 kcal and 66 grams of protein.   11/29 Spoke with Kathe Becton, the pt's daughters regarding TF plan for pt. Pt's daughters request daily monitoring of pt.  Explained to family the plan per MD of trickle feeds of Jevity 1.2 pending c.diff results and chest scan.  Per MD note, pt with possible chronic aspiration despite PEG d/t hiatal hernia. Possible consideration of MBS. Per RN, MD may start trickle feeds at 20 ml/hr, would recommend restarting TF at a lower rate of 10 ml/hr, family concerned for pt's tolerance of continuous TF.  RD to follow-up in am to evaluate patient and tolerance of trickle feeds (if started).  11/30: -Pt negative for C.diff -RD received consult for enteral nutrition initiation, and request for additional fiber additives -Discussed TF with patient and pt's daughter. -Plan to initiate trickle feeds for 24 hour, and to advance as tolerated. Discussed fiber supplements available with pharmacy; PharmD noted available form in tablet is able to be crushed and administered via PEG.   -RD will order Fibercon once daily and increase to recommended dosage of two tablets daily as needed  12/01: -RN reported pt without loose bowel movement yet during shift -Per discussion with family, pt had two small bowel movements yesterday and overnight -Discussed tube feeding regimen with NP. NP noted concern for reflux/aspiration r/t hiatal hernia. Requested to transition to home bolus regimen -Will trial half dosage (120 ml five times daily) and assess tolerance.  -Protein supplement added to better meet calorie and protein needs -Continue with FiberCon, RN reported she had not yet been given supplement, but planned on providing at [place] today -Pt may benefit from smaller frequent bolus w/Pro-Stat vs five 237 ml daily to assist with loose stools upon d/c. RD to continue to monitor to assess tolerance   Height: Ht Readings from Last 1 Encounters:  08/24/14 5\' 3"  (1.6 m)    Weight Status:   Wt Readings from Last 1 Encounters:  08/28/14 150 lb 5.7 oz (68.2 kg)    Re-estimated needs:  Kcal: 1400-1600 Protein: 80-95g Fluid: 1.5L/day  Skin: Stage II Pressure ulcer on buttocks, open necrotic toe wound   Diet Order: Diet NPO time specified   Intake/Output Summary (Last 24 hours) at 08/28/14 1024 Last data filed at 08/28/14 6295  Gross per 24 hour  Intake   1810 ml  Output    225 ml  Net   1585 ml    Last BM: 11/30   Labs:   Recent Labs Lab 08/25/14 0527 08/26/14 0419 08/27/14 0345  NA 134* 135* 132*  K 4.0 3.4* 3.8  CL 100 103 103  CO2 22 20 19   BUN 15 12 10   CREATININE 0.51 0.52 0.46*  CALCIUM 7.5* 7.3* 6.9*  MG 1.8 1.9  --   PHOS 2.6 1.7*  --   GLUCOSE 203* 125* 95    CBG (last 3)   Recent Labs  08/27/14 2327 08/28/14 0349 08/28/14 0745  GLUCAP 104* 90 126*    Scheduled Meds: . antiseptic oral rinse  7 mL Mouth Rinse q12n4p  . aspirin  81 mg Per Tube Daily  . chlorhexidine  15 mL Mouth Rinse BID  . feeding supplement (JEVITY 1.2 CAL)   1,000 mL Per Tube Q24H  . free water  120 mL Per Tube QID  . heparin  5,000 Units Subcutaneous 3 times per day  . insulin aspart  2-6 Units Subcutaneous 6 times per day  . levofloxacin (LEVAQUIN) IV  750 mg Intravenous Q24H  . metoprolol tartrate  6.25 mg Per Tube BID  . polycarbophil  625 mg Per Tube Daily  . prednisoLONE  4.5 mg Per Tube QAC breakfast  . ranitidine  150 mg Per Tube QHS  . saccharomyces boulardii  250 mg Oral BID    Continuous Infusions: . dextrose 5 % and 0.9% NaCl 50 mL/hr at 08/27/14 [place] Clinical Dietitian [phone]

## 2014-08-28 NOTE — Progress Notes (Signed)
Name: Martha Mcbride MRN: [id number] DOB: April 16, 1925    ADMISSION DATE:  08/24/2014  REFERRING MD :  EDP  CHIEF COMPLAINT:  Generalized weakness, fever  BRIEF PATIENT DESCRIPTION: 80 year old with interstitial lung disease, presumed IPF, polymyalgia on chronic prednisone, chronic diastolic heart failure and severe dysphagia status post PEG presents with her third episode of HCAP in the last 6 weeks, with severe sepsis, lactate of 6.  She also has a new malar  rash  SIGNIFICANT EVENTS  11/28  Frequent diarrhea, CDiff neg, TF held   STUDIES:  07/30/14 CT Chest >> Evidence of emphysema with traction bronchiectasis in multiple areas of interstitial fibrosis. Appearance is consistent with underlying usual interstitial pneumonitis. There are areas of patchy consolidation in both lower lobes as wellas to a lesser extent in the upper lobes. There also moderate pleural effusions. The areas of consolidation are felt to most likely represent superimposed pneumonia. There may be a degree of superimposed congestive heart failure as well given the pleural effusions .5 mm nodular opacity in the posterior segment of the right upper lobe. Areas of atherosclerotic change and cardiac valvular calcification. Areas of relatively mild adenopathy. Cholelithiasis. 11/30 DG UGI with KUB >> well positioned g-tube, no extravasation of contrast media, opacification of moderate sized hiatal hernia with reflux to the level of the thoracic inlet while supine, no aspiration observed, incidental finding of duodenal diverticula    Historical Data: 04/2012 CT abd >>large hiatal hernia . Prominent interstitial lung markings suggest chronic changes  06/2014 TEE mild MS, mod AS, nml LVfn 07/24/14  Admit for HCAP, encephalopathy, & dHF exacerbation.  07/28/14 Admit with fever 103, confusion,productive cough.  WBC 12, Lactic acid at 3.5. Chest x ray with diffuse bilateral pulmonary interstitial infiltrates & eosinophilia (AEC  1800 on 10/22 which came down to 600 on 10/31). Diarrhea is resolved & felt unlikely to be parasitic by ID - stool O & P neg. 07/29/14  Ova and parasite stool neg. Strongyloides antibody neg, Quantiferon Tb neg    SUBJECTIVE: Tmax 99.1, Reports no diarrhea off TF.  Reluctant to get out of bed   VITAL SIGNS: Temp:  [98 F (36.7 C)-99.6 F (37.6 C)] 98 F (36.7 C) (12/01 0400) Pulse Rate:  [88-105] 105 (12/01 0800) Resp:  [23-29] 29 (12/01 0800) BP: (114-152)/(53-102) 134/71 mmHg (12/01 0800) SpO2:  [95 %-98 %] 96 % (12/01 0800) Weight:  [150 lb 5.7 oz (68.2 kg)] 150 lb 5.7 oz (68.2 kg) (12/01 0500)  PHYSICAL EXAMINATION: Gen. Chronically ill, elderly woman in no distress, normal affect ENT - no lesions, no post nasal drip, red pharynx, decreased hearing Lungs: no use of accessory muscles, bibasal rales   Cardiovascular: Rhythm regular, heart sounds  normal, no murmurs, no peripheral edema Abdomen: soft and non-tender, BS normal. Musculoskeletal: No deformities, no cyanosis or clubbing Neuro:  alert, non focal Skin:  Warm, no rash seen    Recent Labs Lab 08/25/14 0527 08/26/14 0419 08/27/14 0345  NA 134* 135* 132*  K 4.0 3.4* 3.8  CL 100 103 103  CO2 $Re'22 20 19  'Cyq$ BUN $R'15 12 10  'MK$ CREATININE 0.51 0.52 0.46*  GLUCOSE 203* 125* 95    Recent Labs Lab 08/24/14 1820 08/25/14 0527 08/26/14 0419  HGB 10.3* 10.0* 10.6*  HCT 30.9* 30.5* 33.0*  WBC 7.9 8.4 8.0  PLT 150 135* 124*   Dg Chest Port 1 View  08/27/2014   CLINICAL DATA:  Follow-up infiltrates. History breast cancer. CHF and pneumonia.  EXAM:  PORTABLE CHEST - 1 VIEW  COMPARISON:  08/25/2014  FINDINGS: Progression of airspace disease right upper lobe which may represent pneumonia or asymmetric edema.  Underlying chronic lung disease and interstitial edema. Superimposed interstitial edema pattern is present compared with 08/24/2014. Small pleural effusions.  IMPRESSION: Progression of right upper lobe airspace disease which  may represent pneumonia or asymmetric edema.  Interstitial fibrosis with superimposed interstitial infection versus edema.   Electronically Signed   By: Marlan Palau M.D.   On: 08/27/2014 07:22   Dg Kayleen Memos W/kub  08/27/2014   CLINICAL DATA:  Gastrostomy tube placement 08/06/2014 with clinical suspicion of recurrent aspiration pneumonia.  EXAM: UPPER GI SERIES WITH KUB  TECHNIQUE: After obtaining a scout radiograph barium was injected through the gastrostomy tube under intermittent fluoroscopy as requested. A total of 100 ml of thin barium and approximately 100 ml of air in was injected through the tube. Following the examination, the gastrostomy tube was flushed with 15 ml of water.  FLUOROSCOPY TIME:  1 min and 15 seconds.  COMPARISON:  Chest CT 07/30/2014.  FINDINGS: The scout abdominal radiograph demonstrates the percutaneous gastrostomy to be in good position. The bowel gas pattern is normal. Basilar pulmonary infiltrates and fibrosis are grossly stable. There are degenerative changes throughout the lumbar spine.  On initial injection of the gastrostomy tube with the patient supine, the tip is well position within the gastric body. There is no evidence of extravasation. On further filling of the stomach, there was antegrade passage of contrast into duodenum. Several duodenal diverticula are noted. There is no evidence of gastric outlet obstruction.  There is also filling of a moderate-sized hiatal hernia with subsequent spontaneous gastroesophageal reflux. This was intermittently observed and shown to extend to the level of the thoracic inlet. No aspiration into the tracheobronchial tree observed.  IMPRESSION: 1. The gastrostomy tube is well positioned within the stomach. No extravasation or gastric outlet obstruction. 2. There is opacification of a moderate size hiatal hernia with reflux to the level of the thoracic inlet with the patient supine. No aspiration observed. 3. Multiple duodenal diverticula noted  incidentally. 4. Consider revision of the gastrostomy tube to a gastrojejunostomy to minimize the risk of reflux and aspiration.   Electronically Signed   By: Roxy Horseman M.D.   On: 08/27/2014 17:23    ASSESSMENT / PLAN:  Healthcare associated pneumonia - recurrent, likely aspiration.   Suspected chronic aspiration Suspected superimposed cardiogenic pulm edema Underlying interstitial lung disease, presumed IPF  New malar rash - improved 11/29, spread to scalp, arms only Sore throat - prior to admission   Differential diagnosis here includes aspiration pneumonia, cardiogenic edema. Consider also auto-immune pneumonitis superimposed on her underlying ILD.  ANA positive, 1:40 with speckled pattern , although would not rush to diagnose collagenous vascular disease in her age group. Suspect instead that this is due to chronic aspiration, noted reflux with Rads GI study to thoracic inlet on 11/30 without overt aspiration but remains concerning for aspiration.     Plan: Narrow broad-spectrum antibiotics to Levaquin only - penicillin allergy noted.  Afebrile, no WBC.  CCP pending   Lasix 20 mg IV x1 12/1  Severe sepsis - responded to fluids, resolved.  CHF - without decompensation  Plan: Continue home Metoprolol   Dysphagia - keep nothing by mouth for now.  Last SLP evaluation 11/3 notable for aspiration of nectar thick liquids, aspiration of secretions with barium without awareness.  11/30 DG UGI with aspiration to level of thoracic  inlet without overt aspiration but further supports concerns of chronic aspiration Hiatal Hernia -likely still aspirating, in spite of her PEG due to hiatal hernia, consider further evaluation.  Last SLP notes reflected aspiration of barium without awareness.  Protein Calorie Malnutrition GERD  Plan: Resume TF, attempt bolus feedings to mimic home feeding.  Begin with half volume 12/1, progress to full volume 12/2.   Add protein (will reduce overall volume),  fiber  Patient has not missed oral feedings, is ok with bolus feeding from PEG Not likely a candidate for surgical repair of hernia Consider change of G tube to J tube to further reduce risk of aspiration but feel if we can achieve nutritional support via small bolus feeding it would be optimal to avoid another procedure Free water 120 ml PT QID, monitor sodium  Polymyalgia   Plan:  Continue 4.5 mg QD pred, was being slowly tapered, last dose noted to be 4.5 mg QD per med rec  Diarrhea - likely due to combo abx, TF's.  Resolved.   Plan: PRN imodium  Ask nutrition to add fiber to TF formula, see rec's above Florastor   Rash - erythematous, diffuse on face, arms, circular, blanching. NOT on trunk or LE's.  ESR 45, ANA positive 1:40, speckled pattern. Rash resolving 12/1.  Plan: Monitor   Sacral Decubitus - had prior to admission.   Dry Gangrene of R Toes   Plan: WOC following Monitor toes, no acute interventions.  Previously seen by VVS.   Deconditioning  Dementia - mild  Plan: PT Consult Mobilize, out of bed BID, pt has been reluctant due to sacral ulcer.   GLOBAL: Family updated extensively at bedside. Tx to medical floor.  Push PT & nutritional efforts. They are realistic on goals of care.  Excellent family support.  Daughter [name]) indicates they want focus of care to be on continued medical interventions that support & promote quality of life.  They would not want her to be supported long term by artifical means or for her to live in a state of suffering.     Noe Gens, NP-C [facility] Pgr: [phone] or [phone]  Attending:  I have seen and examined the patient with nurse practitioner/resident and agree with the note above.   On my exam she has crackles bilaterally but is breathing comfortably I think that we are dealing with recurrent aspiration, results of yesterday's upper GI reviewed Family updated at length.  They are considering a J  tube, though I explained that will not necessarily reduce risk of aspiration.  Her wish is to go home and spend as much time with family as possible and is willing to give up eating.  Roselie Awkward, MD [facility] Pager: [phone] Cell: [phone] If no response, call [phone]

## 2014-08-28 NOTE — Clinical Documentation Improvement (Signed)
PLEASE NOTE IF PRESENT ON ADMISSION  Presents with Sepsis, Pneumonia, moderate Malnutrition.   Sacral decubitus documented with no stage identified  Please clarify the stage of sacral decubiti and document findings in next progress note and include in discharge summary.   Stage  I  Pressure Ulcer   (reddening of the skin) Stage  II Pressure Ulcer  (blister open or unopened) Stage  III Pressure Ulcer (through all layers skin) Stage IV Pressure Ulcer   (through skin & underlying  muscle, tendons, and bones) Other Condition  Thank You, Zoila Shutter ,RN Clinical Documentation Specialist:  [facility]

## 2014-08-29 DIAGNOSIS — J849 Interstitial pulmonary disease, unspecified: Secondary | ICD-10-CM

## 2014-08-29 LAB — BASIC METABOLIC PANEL
ANION GAP: 13 (ref 5–15)
BUN: 12 mg/dL (ref 6–23)
CO2: 19 meq/L (ref 19–32)
Calcium: 7.4 mg/dL — ABNORMAL LOW (ref 8.4–10.5)
Chloride: 106 mEq/L (ref 96–112)
Creatinine, Ser: 0.48 mg/dL — ABNORMAL LOW (ref 0.50–1.10)
GFR calc non Af Amer: 86 mL/min — ABNORMAL LOW (ref >90)
Glucose, Bld: 176 mg/dL — ABNORMAL HIGH (ref 70–99)
POTASSIUM: 3.4 meq/L — AB (ref 3.7–5.3)
Sodium: 138 mEq/L (ref 137–147)

## 2014-08-29 LAB — GLUCOSE, CAPILLARY
GLUCOSE-CAPILLARY: 116 mg/dL — AB (ref 70–99)
GLUCOSE-CAPILLARY: 122 mg/dL — AB (ref 70–99)
GLUCOSE-CAPILLARY: 70 mg/dL (ref 70–99)
GLUCOSE-CAPILLARY: 84 mg/dL (ref 70–99)
Glucose-Capillary: 157 mg/dL — ABNORMAL HIGH (ref 70–99)
Glucose-Capillary: 86 mg/dL (ref 70–99)

## 2014-08-29 MED ORDER — JEVITY 1.2 CAL PO LIQD
120.0000 mL | ORAL | Status: DC
  Administered 2014-08-29: 120 mL
  Filled 2014-08-29 (×2): qty 237

## 2014-08-29 MED ORDER — POTASSIUM CHLORIDE 20 MEQ/15ML (10%) PO SOLN
40.0000 meq | Freq: Once | ORAL | Status: AC
  Administered 2014-08-29: 40 meq
  Filled 2014-08-29: qty 30

## 2014-08-29 MED ORDER — VITAMINS A & D EX OINT
TOPICAL_OINTMENT | CUTANEOUS | Status: AC
  Administered 2014-08-29: 14:00:00
  Filled 2014-08-29: qty 25

## 2014-08-29 MED ORDER — PRO-STAT SUGAR FREE PO LIQD
30.0000 mL | Freq: Two times a day (BID) | ORAL | Status: DC
  Administered 2014-08-30: 30 mL
  Filled 2014-08-29 (×5): qty 30

## 2014-08-29 MED ORDER — FLUCONAZOLE 40 MG/ML PO SUSR
150.0000 mg | Freq: Once | ORAL | Status: AC
  Administered 2014-08-29: 152 mg
  Filled 2014-08-29: qty 3.8

## 2014-08-29 MED ORDER — JEVITY 1.2 CAL PO LIQD: 237.0000 mL | Freq: Every day | ORAL | Status: DC

## 2014-08-29 MED ORDER — JEVITY 1.2 CAL PO LIQD
237.0000 mL | Freq: Four times a day (QID) | ORAL | Status: DC
  Administered 2014-08-29 – 2014-08-30 (×3): 237 mL
  Administered 2014-08-30: 120 mL
  Filled 2014-08-29 (×4): qty 237

## 2014-08-29 MED ORDER — VITAMINS A & D EX OINT
TOPICAL_OINTMENT | CUTANEOUS | Status: AC
  Administered 2014-08-29: 13:00:00
  Filled 2014-08-29: qty 5

## 2014-08-29 MED ORDER — FUROSEMIDE 10 MG/ML IJ SOLN
40.0000 mg | Freq: Once | INTRAMUSCULAR | Status: AC
  Administered 2014-08-29: 40 mg via INTRAVENOUS
  Filled 2014-08-29: qty 4

## 2014-08-29 MED ORDER — POTASSIUM CHLORIDE 20 MEQ/15ML (10%) PO SOLN
20.0000 meq | Freq: Once | ORAL | Status: AC
  Administered 2014-08-29: 20 meq
  Filled 2014-08-29: qty 15

## 2014-08-29 NOTE — Progress Notes (Signed)
Patient having more frequent PVCs and rhythm change since this morning. Patient asymptomatic. Got an EKG and called the box. MD Mungal requested BMP and to call back with results.

## 2014-08-29 NOTE — Progress Notes (Signed)
eLink Physician-Brief Progress Note Patient Name: ZIERRA LAROQUE DOB: 1924/10/19 MRN: [id number]   Date of Service  08/29/2014  HPI/Events of Note  hypokalemia  eICU Interventions  Replaced      Intervention Category Minor Interventions: Electrolytes abnormality - evaluation and management  Tadashi Burkel 08/29/2014, 5:28 PM

## 2014-08-29 NOTE — Progress Notes (Signed)
Name: Martha Mcbride MRN: [id number] DOB: 1925/05/24    ADMISSION DATE:  08/24/2014  REFERRING MD :  EDP  CHIEF COMPLAINT:  Generalized weakness, fever  BRIEF PATIENT DESCRIPTION: 80 year old with interstitial lung disease, presumed IPF, polymyalgia on chronic prednisone, chronic diastolic heart failure and severe dysphagia status post PEG presents with her third episode of HCAP in the last 6 weeks, with severe sepsis, lactate of 6.  She also has a new malar  rash  SIGNIFICANT EVENTS  11/28  Frequent diarrhea, CDiff neg, TF held   STUDIES:  07/30/14 CT Chest >> Evidence of emphysema with traction bronchiectasis in multiple areas of interstitial fibrosis. Appearance is consistent with underlying usual interstitial pneumonitis. There are areas of patchy consolidation in both lower lobes as wellas to a lesser extent in the upper lobes. There also moderate pleural effusions. The areas of consolidation are felt to most likely represent superimposed pneumonia. There may be a degree of superimposed congestive heart failure as well given the pleural effusions .5 mm nodular opacity in the posterior segment of the right upper lobe. Areas of atherosclerotic change and cardiac valvular calcification. Areas of relatively mild adenopathy. Cholelithiasis. 11/30 DG UGI with KUB >> well positioned g-tube, no extravasation of contrast media, opacification of moderate sized hiatal hernia with reflux to the level of the thoracic inlet while supine, no aspiration observed, incidental finding of duodenal diverticula    Historical Data: 04/2012 CT abd >>large hiatal hernia . Prominent interstitial lung markings suggest chronic changes  06/2014 TEE mild MS, mod AS, nml LVfn 07/24/14  Admit for HCAP, encephalopathy, & dHF exacerbation.  07/28/14 Admit with fever 103, confusion,productive cough.  WBC 12, Lactic acid at 3.5. Chest x ray with diffuse bilateral pulmonary interstitial infiltrates & eosinophilia (AEC  1800 on 10/22 which came down to 600 on 10/31). Diarrhea is resolved & felt unlikely to be parasitic by ID - stool O & P neg. 07/29/14  Ova and parasite stool neg. Strongyloides antibody neg, Quantiferon Tb neg    SUBJECTIVE:  Pt reports loose stools.  Waiting on bed on medical floor.     VITAL SIGNS: Temp:  [98.6 F (37 C)-99.2 F (37.3 C)] 98.8 F (37.1 C) (12/02 0000) Pulse Rate:  [88-108] 108 (12/02 0952) Resp:  [18-35] 24 (12/02 0600) BP: (100-128)/(32-63) 123/32 mmHg (12/02 0952) SpO2:  [95 %-100 %] 97 % (12/02 0600) Weight:  [157 lb 13.6 oz (71.6 kg)] 157 lb 13.6 oz (71.6 kg) (12/02 0600)  PHYSICAL EXAMINATION: Gen. Chronically ill, elderly woman in no distress, normal affect ENT - no lesions, decreased hearing Lungs: no use of accessory muscles, bilateral rales   Cardiovascular: Rhythm regular, heart sounds normal, no murmurs, trace peripheral edema Abdomen: soft and non-tender, BS normal. Musculoskeletal: No deformities, no cyanosis or clubbing Neuro:  alert, non focal Skin:  Warm, resolving rash    Recent Labs Lab 08/25/14 0527 08/26/14 0419 08/27/14 0345  NA 134* 135* 132*  K 4.0 3.4* 3.8  CL 100 103 103  CO2 _0 BUN _1 CREATININE 0.51 0.52 0.46*  GLUCOSE 203* 125* 95    Recent Labs Lab 08/24/14 1820 08/25/14 0527 08/26/14 0419  HGB 10.3* 10.0* 10.6*  HCT 30.9* 30.5* 33.0*  WBC 7.9 8.4 8.0  PLT 150 135* 124*   Dg Ugi W/kub  08/27/2014   CLINICAL DATA:  Gastrostomy tube placement 08/06/2014 with clinical suspicion of recurrent aspiration pneumonia.  EXAM: UPPER GI SERIES WITH KUB  TECHNIQUE:  After obtaining a scout radiograph barium was injected through the gastrostomy tube under intermittent fluoroscopy as requested. A total of 100 ml of thin barium and approximately 100 ml of air in was injected through the tube. Following the examination, the gastrostomy tube was flushed with 15 ml of water.  FLUOROSCOPY TIME:  1 min and 15 seconds.   COMPARISON:  Chest CT 07/30/2014.  FINDINGS: The scout abdominal radiograph demonstrates the percutaneous gastrostomy to be in good position. The bowel gas pattern is normal. Basilar pulmonary infiltrates and fibrosis are grossly stable. There are degenerative changes throughout the lumbar spine.  On initial injection of the gastrostomy tube with the patient supine, the tip is well position within the gastric body. There is no evidence of extravasation. On further filling of the stomach, there was antegrade passage of contrast into duodenum. Several duodenal diverticula are noted. There is no evidence of gastric outlet obstruction.  There is also filling of a moderate-sized hiatal hernia with subsequent spontaneous gastroesophageal reflux. This was intermittently observed and shown to extend to the level of the thoracic inlet. No aspiration into the tracheobronchial tree observed.  IMPRESSION: 1. The gastrostomy tube is well positioned within the stomach. No extravasation or gastric outlet obstruction. 2. There is opacification of a moderate size hiatal hernia with reflux to the level of the thoracic inlet with the patient supine. No aspiration observed. 3. Multiple duodenal diverticula noted incidentally. 4. Consider revision of the gastrostomy tube to a gastrojejunostomy to minimize the risk of reflux and aspiration.   Electronically Signed   By: Camie Patience M.D.   On: 08/27/2014 17:23    ASSESSMENT / PLAN:  Healthcare associated pneumonia - recurrent, likely aspiration.   Suspected chronic aspiration Suspected superimposed cardiogenic pulm edema Underlying interstitial lung disease, presumed IPF  New malar rash -  spread to scalp, arms only.  Resolved.  Sore throat - prior to admission   Differential diagnosis here includes aspiration pneumonia, cardiogenic edema. Consider also auto-immune pneumonitis superimposed on her underlying ILD.  ANA positive, 1:40 with speckled pattern, CCP negative,  although would not rush to diagnose collagenous vascular disease in her age group. Suspect instead that this is due to chronic aspiration, noted reflux with Rads GI study to thoracic inlet on 11/30 without overt aspiration but remains concerning for aspiration.     Plan: Continue Levaquin D6/x abx - penicillin allergy noted.  Afebrile, no WBC.  Lasix 40 mg IV x1 12/2 + KCL  Severe sepsis - responded to fluids, resolved.  CHF - without decompensation  Plan: Continue home Metoprolol  Lasix as above Repeat labs in am   Dysphagia - keep nothing by mouth for now.  Last SLP evaluation 11/3 notable for aspiration of nectar thick liquids, aspiration of secretions with barium without awareness.  11/30 DG UGI with aspiration to level of thoracic inlet without overt aspiration but further supports concerns of chronic aspiration Hiatal Hernia -likely still aspirating, in spite of her PEG due to hiatal hernia, consider further evaluation.  Last SLP notes reflected aspiration of barium without awareness.  Protein Calorie Malnutrition GERD  Plan: Resume TF, attempt bolus feedings to mimic home feeding.  Begin with half volume 12/1, progress to full volume 12/2.   Add protein (will reduce overall volume), fiber  Patient has not missed oral feedings, is ok with bolus feeding from PEG Not likely a candidate for surgical repair of hernia Consider change of G tube to J tube to further reduce risk of aspiration  but feel if we can achieve nutritional support via small bolus feeding it would be optimal to avoid another procedure Free water 120 ml PT QID, monitor sodium  Polymyalgia   Plan:  Continue 4.5 mg QD pred, was being slowly tapered, last dose noted to be 4.5 mg QD per med rec  Diarrhea - likely due to combo abx, TF's.  Resolved.   Plan: PRN imodium  Ask nutrition to add fiber to TF formula, see rec's above Florastor   Rash - erythematous, diffuse on face, arms, circular, blanching. NOT on  trunk or LE's.  ESR 45, ANA positive 1:40, speckled pattern. Rash resolving 12/1.  Plan: Monitor   Sacral Decubitus, Stage III - had prior to admission.   Dry Gangrene of R Toes   Plan: WOC following Monitor toes, no acute interventions.  Previously seen by VVS.   Deconditioning  Dementia - mild  Plan: PT Consult Mobilize, out of bed BID, pt has been reluctant due to sacral ulcer.   GLOBAL: Family updated extensively at bedside. Tx to medical floor.  Push PT & nutritional efforts. They are realistic on goals of care.  Excellent family support.  Daughter [name]) indicates they want focus of care to be on continued medical interventions that support & promote quality of life.  They would not want her to be supported long term by artifical means or for her to live in a state of suffering.  Probable d/c 12/3 or 12/4   Noe Gens, NP-C [facility] Pgr: [phone] or [phone]  Attending:  I have seen and examined the patient with nurse practitioner/resident and agree with the note above.   On my exam crackles are unchanged but she looked great to me with less respiratory distress  I explained the situation to her daughter [name] today at length.  Feel this is all aspiration pneumonia.  They plan for ongoing tube feedings and understand that she will develop recurrent aspiration pneumonia.  Move to floor today Will continue to work with dietary Sit up while receiving tube feedings  Explained to daughter today that there is nothing we can do to prevent aspiration (Peg, J, sitting up, taking by mouth).  They have elected to try ongoing gastric feedings for now.  Roselie Awkward, MD [facility] Pager: [phone] Cell: [phone] If no response, call [phone]

## 2014-08-29 NOTE — Discharge Summary (Signed)
Physician Discharge Summary  Patient ID: Martha Mcbride MRN: [id number] DOB/AGE: 1925-06-22 80 y.o.  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  Deconditioning  Dementia                                                                        DISCHARGE PLAN BY DIAGNOSIS     Recurrent Aspiration PNA Cardiogenic Pulmonary Edema ILD, presumed IPF Oxygen Dependent - nocturnal   Discharge Plan:  Aspiration precautions:  Upright positioning during and after feeding  Lasix 20 mg QD Follow up with Tammy Parrett 4 weeks Resume nocturnal O2, and as needed  Severe Sepsis - resolved, in setting of HCAP CHF   Discharge Plan:  Continue Lopressor  Resume Lasix 20 mg QD  Dysphagia: Last SLP evaluation 11/3 notable for aspiration of nectar thick liquids, aspiration of secretions with barium without awareness. 11/30 DG UGI with aspiration to level of thoracic inlet without overt aspiration but further supports concerns of chronic aspiration.  Hiatal Hernia  Protein Calorie Malnutrition  GERD  Discharge Plan:  Continue Jevity bolus feeding  5 per day Continue NPO Family / patient do not want transition to J-Tube at this point Upright positioning during feeding and 30 minutes after to minimize aspiration risk  Not a candidate for hernia repair   Polymyalgia   Discharge Plan:  Continue prednisone 40 mg QD, will f/u with [name]  Diarrhea  Discharge Plan:  Imodium scheduled BID and PRN  Rash - resolved.  Sacral Decubitus, Stage III Dry Gangrene of R Toes   Discharge Plan:  Continue wound care per [facility] RN  Keep areas clean and dry Follow up with Dr. Oneida Alar as previously instructed for  gangrene of toes (previously recommended no acute interventions)   Deconditioning  Dementia   Discharge Plan:  Resume home PT, OT Mobilize, OOB                 DISCHARGE SUMMARY   Martha Mcbride is a 80 y.o. y/o female with a PMH of interstitial lung disease, presumed IPF, polymyalgia on chronic prednisone, chronic diastolic heart failure and severe dysphagia status post PEG who was admitted on 08/24/14 to [facility] after being seen at an urgent care for cough, fever and worsening weakness.  Initial ER chest xray was concerning for RUL infiltrate.  She was noted to have a lactic acid of 6 and hypotension that responded to volume resuscitation.  This is her third HCAP in the past 6 weeks.  She was also noted to have a new malar rash.  The patient was admitted to the SDU for observation. She was pan cultured and to date are negative. We had planned on sending her home on 12/7 but she was having fever. We consulted infectious disease due to on-going fever. They did not feel that she was actively infected and recommended no further antibiotics. Additional concerns we addressed were on going aspiration for which we consulted  IR to consider GJ tube (due to DG UGI series showing: reflux to the level of the thoracic inlet, and concern for further aspiration) but at this point the track from her current G tube has not matured enough so they advised this be re-considered in mid December. At this point the family is leaning towards just keeping the G tube as they are concerned about diarrhea r/t J tube feeding which is already an issue.             SIGNIFICANT EVENTS  11/28 Frequent diarrhea, CDiff neg, TF held   STUDIES:  07/30/14 CT Chest >> Evidence of emphysema with traction bronchiectasis in multiple areas of interstitial fibrosis. Appearance is consistent with underlying usual interstitial pneumonitis. There are areas of patchy consolidation in both lower lobes as wellas to a lesser  extent in the upper lobes. There also moderate pleural effusions. The areas of consolidation are felt to most likely represent superimposed pneumonia. There may be a degree of superimposed congestive heart failure as well given the pleural effusions .5 mm nodular opacity in the posterior segment of the right upper lobe. Areas of atherosclerotic change and cardiac valvular calcification. Areas of relatively mild adenopathy. Cholelithiasis. 11/30 DG UGI with KUB >> well positioned g-tube, no extravasation of contrast media, opacification of moderate sized hiatal hernia with reflux to the level of the thoracic inlet while supine, no aspiration observed, incidental finding of duodenal diverticula   CULTURES:  11/27  Rapid Strep >> neg 11/27 BCx2 >>neg 11/27 UC >> neg  11/27 C-Diff PCR >> neg  Historical Data: 04/2012 CT abd >>large hiatal hernia . Prominent interstitial lung markings suggest chronic changes  06/2014 TEE mild MS, mod AS, nml LVfn 07/24/14 Admit for HCAP, encephalopathy, & dHF exacerbation.  07/28/14 Admit with fever 103, confusion,productive cough. WBC 12, Lactic acid at 3.5. Chest x ray with diffuse bilateral pulmonary interstitial infiltrates & eosinophilia (AEC 1800 on 10/22 which came down to 600 on 10/31). Diarrhea is resolved & felt unlikely to be parasitic by ID - stool O & P neg. 07/29/14 Ova and parasite stool neg. Strongyloides antibody neg, Quantiferon Tb neg      Discharge Exam: Gen. Chronically ill, elderly woman in no distress ENT - nose without purulence, no LN or TMG Lungs: bibasilar crackles, decreased depth inspiration, no wheezing  Cardiovascular: rrr, 3/6 blowing systolic murmur Abdomen: soft and non-tender, BS normal. LE with mild edema, no cyanosis of fingers, dry gangrenous rt toes. Neuro: alert, non focal  Filed Vitals:   09/04/14 1900 09/04/14 2230 09/05/14 0504 09/05/14 0827  BP: 110/60 103/50 124/58 109/53  Pulse: 85 83 81 84  Temp: 97.6  F (36.4 C) 97.3 F (36.3 C)    TempSrc: Oral     Resp: _0 Height:      Weight:   62.596 kg (138 lb)   SpO2: 97% 99% 98%      Discharge Labs  BMET  Recent Labs Lab 08/30/14 0535 08/31/14 0527 09/01/14 0512 09/03/14 1928 09/04/14 0321  NA 139 139 138 137 135*  K 3.8 4.1 4.5 4.2 4.1  CL 105 105 104 100 99  CO2 _1 GLUCOSE 91 102* 86 151* 100*  BUN _2 CREATININE 0.47* 0.49* 0.47* 0.47* 0.47*  CALCIUM 7.5* 7.8* 7.9* 8.2* 8.1*  MG 1.8 1.9  --   --   --    CBC  Recent Labs Lab 09/01/14 [id number]  09/03/14 1920 09/04/14 0321  HGB 10.0* 9.6* 10.0*  HCT 30.8* 30.0* 30.8*  WBC 6.4 8.0 6.0  PLT 131* 126* 139*      Medication List    STOP taking these medications        benzonatate 200 MG capsule  Commonly known as:  TESSALON     prednisoLONE 15 MG/5ML Soln  Commonly known as:  PRELONE     RABEprazole 20 MG tablet  Commonly known as:  ACIPHEX      TAKE these medications        aspirin 81 MG chewable tablet  Place 1 tablet (81 mg total) into feeding tube daily.     feeding supplement (JEVITY 1.2 CAL) Liqd  Place 237 mLs into feeding tube 5 (five) times daily.     folic acid 1 MG tablet  Commonly known as:  FOLVITE  Place 1 tablet (1 mg total) into feeding tube daily.     free water Soln  Place 120 mLs into feeding tube 4 (four) times daily.     furosemide 8 MG/ML solution  Commonly known as:  LASIX  Place 2.5 mLs (20 mg total) into feeding tube daily.     hydroxypropyl methylcellulose / hypromellose 2.5 % ophthalmic solution  Commonly known as:  ISOPTO TEARS / GONIOVISC  Place 1 drop into both eyes every morning.     loperamide 1 MG/5ML solution  Commonly known as:  IMODIUM  Place 10 mLs (2 mg total) into feeding tube 2 (two) times daily. May take 1 additional  dose or decrease as needed     metoprolol tartrate 25 mg/10 mL Susp  Commonly known as:  LOPRESSOR  Place 2.5 mLs (6.25 mg total) into feeding tube 2 (two)  times daily.     pantoprazole sodium 40 mg/20 mL Pack  Commonly known as:  PROTONIX  Place 20 mLs (40 mg total) into feeding tube 2 (two) times daily.     potassium chloride 20 MEQ/15ML (10%) Soln  Place 15 mLs (20 mEq total) into feeding tube daily.     predniSONE 5 MG/5ML solution  Place 40 mLs (40 mg total) into feeding tube daily.  Start taking on:  09/06/2014     ranitidine 75 MG/5ML syrup  Commonly known as:  ZANTAC  Place 10 mLs (150 mg total) into feeding tube at bedtime.     saccharomyces boulardii 250 MG capsule  Commonly known as:  FLORASTOR  Take 1 capsule (250 mg total) by mouth 2 (two) times daily.          Disposition:  Home with family support.  Home health PT, OT to resume.   Discharged Condition: Martha Mcbride has met maximum benefit of inpatient care and is medically stable and cleared for discharge.  Patient is pending follow up as above.      Time spent on disposition:  Greater than 35 minutes.   Signed: Erick Colace ACNP-BC [facility]   Physician Statement:   The Patient was personally examined, the discharge assessment and plan has been personally reviewed and I agree with ACNP Babcock's assessment and plan. > 30 minutes of time have been dedicated to discharge assessment, planning and discharge instructions.    Rigoberto Noel MD

## 2014-08-29 NOTE — Progress Notes (Signed)
Patient's daughter requested patient only be fed 4oz at a time instead of the 8oz ordered.  She would like patient's "body to get acclimated to being fed again" and feels that any amount higher than 4oz "is causing her diarrhea".  Fed patient 4oz while sitting at 45 degrees.

## 2014-08-29 NOTE — Progress Notes (Signed)
Asked MD Mungal that patient should still go to Med-Surg floor given frequent PVCs and K+ of 3.4. He advised she was okay to go to med-surg since we are replacing K+.

## 2014-08-29 NOTE — Progress Notes (Signed)
ANTIBIOTIC CONSULT NOTE - FOLLOW UP  Pharmacy Consult for Levaquin Indication: pneumonia  Allergies  Allergen Reactions  . Doxycycline Nausea And Vomiting  . Other Nausea And Vomiting and Other (See Comments)    Anesthesia makes her very sleepy and makes it hard for her to come out of it.   Marland Kitchen Penicillins Hives and Nausea And Vomiting  . Sulfa Antibiotics Nausea And Vomiting    Patient Measurements: Height: 5\' 3"  (160 cm) Weight: 157 lb 13.6 oz (71.6 kg) IBW/kg (Calculated) : 52.4  Vital Signs: Temp: 98.8 F (37.1 C) (12/02 0000) Temp Source: Oral (12/02 0000) BP: 123/32 mmHg (12/02 0952) Pulse Rate: 108 (12/02 0952) Intake/Output from previous day: 12/01 0701 - 12/02 0700 In: 8250 [I.V.:525; NG/GT:720; IV Piggyback:150] Out: 475 [Urine:475] Intake/Output from this shift: Total I/O In: 30 [Other:30] Out: -   Labs:  Recent Labs  08/27/14 0345  CREATININE 0.46*   Estimated Creatinine Clearance: 47 mL/min (by C-G formula based on Cr of 0.46). No results for input(s): VANCOTROUGH, VANCOPEAK, VANCORANDOM, GENTTROUGH, GENTPEAK, GENTRANDOM, TOBRATROUGH, TOBRAPEAK, TOBRARND, AMIKACINPEAK, AMIKACINTROU, AMIKACIN in the last 72 hours.    Assessment: 26 YOF with interstitial lung disease, presumed IPF, polymyalgia on chronic prednisone, chronic diastolic heart failure and severe dysphagia status post PEG presents with her third episode of HCAP in the last 6 weeks, with severe sepsis, lactate of 6.  Pt complained of sore throat and facial rash.  Possible pneumonia on CXR. Patient was started on Vancomycin, Aztreonam, Levaquin.  Therapy narrowed to Levaquin 12/1 for HCAP, possibly aspiration pneumonia.    11/27 >> vancomycin >> 12/1 11/27 >> aztreonam >> 12/1 11/27 >> Levaquin >>  Tmax: 99.2 WBCs: WNL Renal: SCr WNL, CrCl ~55CG (using Scr = 0.8 for age) LA: 6.02 -> 3.41  11/27 blood: NGF 11/27 urine: NGF 11/27 Group A Strept cx: neg 11/28 C.diff: neg 11/27 MRSA  screen neg  Today is day #6/x Levaquin 750 mg IV q24h for HCAP, possibly aspiration pna. Per MD notes, patient's rash resolving.  Goal of Therapy:  Doses adjusted per renal function Eradication of infection  Plan:  1.  Continue Levaquin 750 mg IV q24h. 2.  F/u planned duration of treatment.  Hershal Coria 08/29/2014,10:57 AM

## 2014-08-29 NOTE — Progress Notes (Signed)
PT Cancellation Note  Patient Details Name: KATHYE CIPRIANI MRN: [id number] DOB: Jan 24, 1925   Cancelled Treatment:     PT deferred at request of pt/family 2* ongoing bowel issues.  Will follow.   Loghan Kurtzman 08/29/2014, 5:38 PM

## 2014-08-29 NOTE — Progress Notes (Signed)
NUTRITION FOLLOW UP  Intervention:   -Advance Jevity 1.2 bolus regimen to 240 ml QID + 120 ml once daily to provide 1280 kcal, 60 gram protein, 484 ml free H2O -Recommend Pro-Stat BID to provide 200 kcal, 30 gram protein -Regimen provides 1480 kcal (100% est kcal needs), 90 gram protein (100% est protein needs) -FiberCon fiber supplement once daily -Imodium PRN -RD to monitor daily   Nutrition Dx:   Inadequate oral intake related to inability to eat as evidenced by NPO status; ongoing  Goal:   TF to meet >/= 90% of their estimated nutrition needs; progressing  Monitor:   TF tolerance, GI profile, total protein/energy intake, labs, weights  Assessment:   11/29: Pt recently discharged on 11/13.  11/28 Daughters spoke with on-call RD regarding diarrhea and TF. TF was stopped d/t diarrhea. C.Diff pending. Per H&P, PTA patient was receiving 1 can (237 ml) of Jevity 1.2, 5 times daily. This provides 1422 kcal and 66 grams of protein.   11/29 Spoke with Kathe Becton, the pt's daughters regarding TF plan for pt. Pt's daughters request daily monitoring of pt.  Explained to family the plan per MD of trickle feeds of Jevity 1.2 pending c.diff results and chest scan.  Per MD note, pt with possible chronic aspiration despite PEG d/t hiatal hernia. Possible consideration of MBS. Per RN, MD may start trickle feeds at 20 ml/hr, would recommend restarting TF at a lower rate of 10 ml/hr, family concerned for pt's tolerance of continuous TF.  RD to follow-up in am to evaluate patient and tolerance of trickle feeds (if started).  11/30: -Pt negative for C.diff -RD received consult for enteral nutrition initiation, and request for additional fiber additives -Discussed TF with patient and pt's daughter. -Plan to initiate trickle feeds for 24 hour, and to advance as tolerated. Discussed fiber supplements available with pharmacy; PharmD noted available form in tablet is able to be crushed and  administered via PEG.  -RD will order Fibercon once daily and increase to recommended dosage of two tablets daily as needed  12/01: -RN reported pt without loose bowel movement yet during shift -Per discussion with family, pt had two small bowel movements yesterday and overnight -Discussed tube feeding regimen with NP. NP noted concern for reflux/aspiration r/t hiatal hernia. Requested to transition to home bolus regimen -Will trial half dosage (120 ml five times daily) and assess tolerance.  -Protein supplement added to better meet calorie and protein needs -Continue with FiberCon, RN reported she had not yet been given supplement, but planned on providing at [place] today -Pt may benefit from smaller frequent bolus w/Pro-Stat vs five 237 ml daily to assist with loose stools upon d/c. RD to continue to monitor to assess tolerance  12/02: -Pt's daughter reported two smear-like episodes of stool this morning and one pudding-like episode yesterday after bolus feed -Tolerating 120 ml QID, and receiving Pro-Stat BID. Received FiberCon daily since 11/30. Imodium ordered PRN, and given 11/30 -Will progress to home regimen, but modify volume to goal rate of 4.5 cans daily with addition of Pro-Stat BID  Height: Ht Readings from Last 1 Encounters:  08/24/14 5\' 3"  (1.6 m)    Weight Status:   Wt Readings from Last 1 Encounters:  08/29/14 157 lb 13.6 oz (71.6 kg)    Re-estimated needs:  Kcal: 1400-1600 Protein: 80-95g Fluid: 1.5L/day  Skin: Stage III Pressure ulcer on buttocks, open necrotic toe wound   Diet Order: Diet NPO time specified   Intake/Output Summary (  Last 24 hours) at 08/29/14 1125 Last data filed at 08/29/14 0900  Gross per 24 hour  Intake   1605 ml  Output    475 ml  Net   1130 ml    Last BM: 12/02   Labs:   Recent Labs Lab 08/25/14 0527 08/26/14 0419 08/27/14 0345  NA 134* 135* 132*  K 4.0 3.4* 3.8  CL 100 103 103  CO2 22 20 19   BUN 15 12 10   CREATININE  0.51 0.52 0.46*  CALCIUM 7.5* 7.3* 6.9*  MG 1.8 1.9  --   PHOS 2.6 1.7*  --   GLUCOSE 203* 125* 95    CBG (last 3)   Recent Labs  08/28/14 1922 08/28/14 2353 08/29/14 0335  GLUCAP 77 116* 84    Scheduled Meds: . antiseptic oral rinse  7 mL Mouth Rinse q12n4p  . aspirin  81 mg Per Tube Daily  . chlorhexidine  15 mL Mouth Rinse BID  . feeding supplement (JEVITY 1.2 CAL)  120 mL Per Tube 5 X Daily  . feeding supplement (PRO-STAT SUGAR FREE 64)  30 mL Per Tube TID WC  . fluconazole  152 mg Per Tube Once  . free water  120 mL Per Tube QID  . furosemide  40 mg Intravenous Once  . heparin  5,000 Units Subcutaneous 3 times per day  . insulin aspart  2-6 Units Subcutaneous 6 times per day  . levofloxacin (LEVAQUIN) IV  750 mg Intravenous Q24H  . metoprolol tartrate  6.25 mg Per Tube BID  . polycarbophil  625 mg Per Tube Daily  . potassium chloride  20 mEq Per Tube Once  . prednisoLONE  4.5 mg Per Tube QAC breakfast  . ranitidine  150 mg Per Tube QHS  . saccharomyces boulardii  250 mg Oral BID    Continuous Infusions:    Atlee Abide MS RD LDN Clinical Dietitian [phone]

## 2014-08-30 ENCOUNTER — Inpatient Hospital Stay (HOSPITAL_COMMUNITY): Payer: Medicare Other

## 2014-08-30 DIAGNOSIS — T17998S Other foreign object in respiratory tract, part unspecified causing other injury, sequela: Secondary | ICD-10-CM

## 2014-08-30 DIAGNOSIS — E46 Unspecified protein-calorie malnutrition: Secondary | ICD-10-CM

## 2014-08-30 LAB — CULTURE, BLOOD (ROUTINE X 2)
Culture: NO GROWTH
Culture: NO GROWTH

## 2014-08-30 LAB — BASIC METABOLIC PANEL
ANION GAP: 13 (ref 5–15)
BUN: 11 mg/dL (ref 6–23)
CHLORIDE: 105 meq/L (ref 96–112)
CO2: 21 meq/L (ref 19–32)
Calcium: 7.5 mg/dL — ABNORMAL LOW (ref 8.4–10.5)
Creatinine, Ser: 0.47 mg/dL — ABNORMAL LOW (ref 0.50–1.10)
GFR calc non Af Amer: 86 mL/min — ABNORMAL LOW (ref >90)
Glucose, Bld: 91 mg/dL (ref 70–99)
POTASSIUM: 3.8 meq/L (ref 3.7–5.3)
Sodium: 139 mEq/L (ref 137–147)

## 2014-08-30 LAB — CBC
HCT: 30.8 % — ABNORMAL LOW (ref 36.0–46.0)
HEMOGLOBIN: 10.2 g/dL — AB (ref 12.0–15.0)
MCH: 30.6 pg (ref 26.0–34.0)
MCHC: 33.1 g/dL (ref 30.0–36.0)
MCV: 92.5 fL (ref 78.0–100.0)
Platelets: 116 10*3/uL — ABNORMAL LOW (ref 150–400)
RBC: 3.33 MIL/uL — AB (ref 3.87–5.11)
RDW: 21.4 % — ABNORMAL HIGH (ref 11.5–15.5)
WBC: 7.3 10*3/uL (ref 4.0–10.5)

## 2014-08-30 LAB — GLUCOSE, CAPILLARY
GLUCOSE-CAPILLARY: 74 mg/dL (ref 70–99)
GLUCOSE-CAPILLARY: 133 mg/dL — AB (ref 70–99)
GLUCOSE-CAPILLARY: 110 mg/dL — AB (ref 70–99)
Glucose-Capillary: 154 mg/dL — ABNORMAL HIGH (ref 70–99)
Glucose-Capillary: 130 mg/dL — ABNORMAL HIGH (ref 70–99)
Glucose-Capillary: 91 mg/dL (ref 70–99)

## 2014-08-30 LAB — MAGNESIUM: MAGNESIUM: 1.8 mg/dL (ref 1.5–2.5)

## 2014-08-30 MED ORDER — CALCIUM POLYCARBOPHIL 625 MG PO TABS
625.0000 mg | ORAL_TABLET | Freq: Every day | ORAL | Status: DC
  Administered 2014-08-31 – 2014-09-04 (×5): 625 mg
  Filled 2014-08-30 (×6): qty 1

## 2014-08-30 MED ORDER — JEVITY 1.2 CAL PO LIQD
120.0000 mL | Freq: Every day | ORAL | Status: DC
  Administered 2014-08-30 – 2014-08-31 (×5): 120 mL
  Filled 2014-08-30 (×5): qty 237

## 2014-08-30 MED ORDER — PRO-STAT SUGAR FREE PO LIQD
30.0000 mL | Freq: Three times a day (TID) | ORAL | Status: DC
  Administered 2014-08-30 – 2014-09-05 (×19): 30 mL
  Filled 2014-08-30 (×21): qty 30

## 2014-08-30 MED ORDER — POTASSIUM CHLORIDE 20 MEQ/15ML (10%) PO SOLN
40.0000 meq | Freq: Every day | ORAL | Status: DC
  Administered 2014-08-30 – 2014-09-05 (×7): 40 meq
  Filled 2014-08-30 (×8): qty 30

## 2014-08-30 MED ORDER — FUROSEMIDE 20 MG PO TABS
20.0000 mg | ORAL_TABLET | Freq: Every day | ORAL | Status: DC
  Administered 2014-08-30 – 2014-09-05 (×7): 20 mg via ORAL
  Filled 2014-08-30 (×8): qty 1

## 2014-08-30 MED ORDER — CALCIUM POLYCARBOPHIL 625 MG PO TABS: 1250.0000 mg | ORAL_TABLET | Freq: Every day | ORAL | Status: DC

## 2014-08-30 NOTE — Progress Notes (Signed)
Physical Therapy Treatment Patient Details Name: LIAH MORR MRN: [id number] DOB: January 25, 1925 Today's Date: 08/30/2014    History of Present Illness 80 year old with interstitial lung disease, presumed IPF, polymyalgia on chronic prednisone, chronic diastolic heart failure and severe dysphagia status post PEG presents with her third episode of HCAP in the last 6 weeks, with severe sepsis, . Patient recently in hospital.    PT Comments    Pt was able to tolerate 20 feet of gait on RA this tx but fatigues easily and requires min-mod motivation to participate.  Pt was min-mod A with all mobility.    Follow Up Recommendations  Home health PT;Supervision/Assistance - 24 hour     Equipment Recommendations  None recommended by PT    Recommendations for Other Services       Precautions / Restrictions Precautions Precautions: Fall Restrictions Weight Bearing Restrictions: No    Mobility  Bed Mobility Overal bed mobility: Needs Assistance Bed Mobility: Supine to Sit     Supine to sit: Mod assist     General bed mobility comments: A for LEs and manual forward scoot with bedpad  Transfers Overall transfer level: Needs assistance Equipment used: Rolling walker (2 wheeled) Transfers: Sit to/from Stand Sit to Stand: Min assist         General transfer comment: performed x2, A to BSC; tolerated 3 minutes of standing balance for A with hygiene; increased time to rise and stabilize  Ambulation/Gait Ambulation/Gait assistance: Min assist Ambulation Distance (Feet): 20 Feet Assistive device: Rolling walker (2 wheeled) Gait Pattern/deviations: Decreased stride length;Shuffle;Trunk flexed;Step-to pattern Gait velocity: decr   General Gait Details: VCs for hand placement and min tactile cues for RW management; pt fatigues easily   Stairs            Wheelchair Mobility    Modified Rankin (Stroke Patients Only)       Balance                                     Cognition Arousal/Alertness: Awake/alert Behavior During Therapy: WFL for tasks assessed/performed Overall Cognitive Status: Within Functional Limits for tasks assessed                      Exercises      General Comments        Pertinent Vitals/Pain Pain Assessment: No/denies pain    Home Living                      Prior Function            PT Goals (current goals can now be found in the care plan section) Progress towards PT goals: Progressing toward goals    Frequency  Min 3X/week    PT Plan Current plan remains appropriate    Co-evaluation             End of Session Equipment Utilized During Treatment: Gait belt Activity Tolerance: Patient limited by fatigue Patient left: in chair;with call bell/phone within reach;with family/visitor present     Time: 1530-1559 PT Time Calculation (min) (ACUTE ONLY): 29 min  Charges:                       G Codes:      Miller,Derrick, SPTA 08/30/2014, 2:47 PM   Reviewed above  Rica Koyanagi  PTA [facility]  Acute  Rehab Pager      [phone]

## 2014-08-30 NOTE — Progress Notes (Signed)
- Name: Martha Mcbride MRN: [id number] DOB: 07/22/1925    ADMISSION DATE:  08/24/2014  REFERRING MD :  EDP  CHIEF COMPLAINT:  Generalized weakness, fever  BRIEF PATIENT DESCRIPTION: 80 year old with interstitial lung disease, presumed IPF, polymyalgia on chronic prednisone, chronic diastolic heart failure and severe dysphagia status post PEG presents with her third episode of HCAP in the last 6 weeks, with severe sepsis, lactate of 6.  She also has a new malar  rash  SIGNIFICANT EVENTS  11/28  Frequent diarrhea, CDiff neg, TF held  12/02  Moved to floor  STUDIES:  07/30/14 CT Chest >> Evidence of emphysema with traction bronchiectasis in multiple areas of interstitial fibrosis. Appearance is consistent with underlying usual interstitial pneumonitis. There are areas of patchy consolidation in both lower lobes as wellas to a lesser extent in the upper lobes. There also moderate pleural effusions. The areas of consolidation are felt to most likely represent superimposed pneumonia. There may be a degree of superimposed congestive heart failure as well given the pleural effusions .5 mm nodular opacity in the posterior segment of the right upper lobe. Areas of atherosclerotic change and cardiac valvular calcification. Areas of relatively mild adenopathy. Cholelithiasis. 11/30 DG UGI with KUB >> well positioned g-tube, no extravasation of contrast media, opacification of moderate sized hiatal hernia with reflux to the level of the thoracic inlet while supine, no aspiration observed, incidental finding of duodenal diverticula    Historical Data: 04/2012 CT abd >>large hiatal hernia . Prominent interstitial lung markings suggest chronic changes  06/2014 TEE mild MS, mod AS, nml LVfn 07/24/14  Admit for HCAP, encephalopathy, & dHF exacerbation.  07/28/14 Admit with fever 103, confusion,productive cough.  WBC 12, Lactic acid at 3.5. Chest x ray with diffuse bilateral pulmonary interstitial  infiltrates & eosinophilia (AEC 1800 on 10/22 which came down to 600 on 10/31). Diarrhea is resolved & felt unlikely to be parasitic by ID - stool O & P neg. 07/29/14  Ova and parasite stool neg. Strongyloides antibody neg, Quantiferon Tb neg    SUBJECTIVE:  Ongoing loose stools, imodium helping.  Daughters with multiple questions (repetative from previous days, diarrhea, nutrition, edema etc)   VITAL SIGNS: Temp:  [97.5 F (36.4 C)-98.2 F (36.8 C)] 98 F (36.7 C) (12/03 1145) Pulse Rate:  [32-100] 95 (12/03 1145) Resp:  [23-30] 23 (12/03 1145) BP: (102-112)/(48-56) 108/48 mmHg (12/03 1145) SpO2:  [90 %-100 %] 99 % (12/03 1145) Weight:  [156 lb 8 oz (70.988 kg)] 156 lb 8 oz (70.988 kg) (12/03 3291)  PHYSICAL EXAMINATION: Gen. Chronically ill, elderly woman in no distress, normal affect ENT - no lesions, decreased hearing Lungs: no use of accessory muscles, bilateral rales improved   Cardiovascular: Rhythm regular, heart sounds normal, no murmurs, trace peripheral edema Abdomen: soft and non-tender, BS normal. Musculoskeletal: No deformities, no cyanosis or clubbing Neuro:  alert, non focal Skin:  Warm, resolving rash    Recent Labs Lab 08/27/14 0345 08/29/14 1630 08/30/14 0535  NA 132* 138 139  K 3.8 3.4* 3.8  CL 103 106 105  CO2 _0 BUN _1 CREATININE 0.46* 0.48* 0.47*  GLUCOSE 95 176* 91    Recent Labs Lab 08/25/14 0527 08/26/14 0419 08/30/14 0535  HGB 10.0* 10.6* 10.2*  HCT 30.5* 33.0* 30.8*  WBC 8.4 8.0 7.3  PLT 135* 124* 116*   Dg Chest Port 1 View  08/30/2014   CLINICAL DATA:  Aspiration pneumonia.  EXAM: PORTABLE CHEST -  1 VIEW  COMPARISON:  08/27/2014.  02/10/2014.  FINDINGS: Mediastinum and hilar structures are normal. Stable cardiomegaly. Stable mitral annular calcification. Pulmonary vascularity normal. Progression of right upper lobe infiltrate consistent with pneumonia. Persistent changes of chronic interstitial lung disease. No  prominent pleural effusion. No pneumothorax. No acute osseus abnormality.  IMPRESSION: 1. Progression of right upper lobe infiltrate. 2. Stable changes of chronic interstitial lung disease. 3. Stable cardiomegaly with normal pulmonary vascularity.   Electronically Signed   By: Marcello Moores  Register   On: 08/30/2014 07:36    ASSESSMENT / PLAN:  Healthcare associated pneumonia - recurrent, likely aspiration.   Suspected chronic aspiration Suspected superimposed cardiogenic pulm edema Underlying interstitial lung disease, presumed IPF  New malar rash -  spread to scalp, arms only.  Resolved.  Sore throat - prior to admission   Plan: Continue Levaquin D7/x abx - penicillin allergy noted.  Afebrile, no WBC.  Resume home dose lasix 20 mg QD  Severe sepsis - responded to fluids, resolved.  CHF - without decompensation  Plan: Continue home Metoprolol  Lasix as above Repeat labs in am  HR <110 acceptable  Dysphagia - keep nothing by mouth for now.  Last SLP evaluation 11/3 notable for aspiration of nectar thick liquids, aspiration of secretions with barium without awareness.  11/30 DG UGI with aspiration to level of thoracic inlet without overt aspiration but further supports concerns of chronic aspiration Hiatal Hernia - likely still aspirating, in spite of her PEG due to hiatal hernia, consider further evaluation.  Last SLP notes reflected aspiration of barium without awareness.  Protein Calorie Malnutrition GERD  Plan: TF, bolus feedings to mimic home feeding.    Protein (will reduce overall volume), fiber supplement Patient has not missed oral feedings, is ok with bolus feeding from PEG Not likely a candidate for surgical repair of hernia Free water 120 ml PT QID, monitor sodium  Polymyalgia   Plan:  Continue 4.5 mg QD pred, was being slowly tapered, last dose noted to be 4.5 mg QD per med rec Review dose prednisone in office with Dr. Elsworth Soho  Diarrhea - likely due to combo abx, TF's.   Resolved.   Plan: PRN imodium  Ask nutrition to add fiber to TF formula, see rec's above [place]   Rash - erythematous, diffuse on face, arms, circular, blanching. NOT on trunk or LE's.  ESR 45, ANA positive 1:40, speckled pattern. Rash resolving 12/1.  Plan: Monitor   Sacral Decubitus, Stage III - had prior to admission.   Dry Gangrene of R Toes   Plan: WOC following Monitor toes, no acute interventions.  Previously seen by [facility].   Deconditioning  Dementia - mild  Plan: PT Consult Mobilize, out of bed BID, pt has been reluctant due to sacral ulcer.   GLOBAL: Family updated extensively at bedside. Tx to medical floor.  Push PT & nutritional efforts.  Hopeful d/c in am 12/4 pending CXR review & diet tolerance.   Noe Gens, NP-C [facility] Pgr: [phone] or [phone]    Attending:  I have seen and examined the patient with nurse practitioner/resident and agree with the note above.   ON my exam today her lungs actually sound better, and she is feeling better. However her CXR showed possible progression in her RUL infiltrate.  I think this was likely due to a different technique in taking the CXR  We are still working with dietary on her tube feedings.  Overall her prognosis is poor as  she is quite weak.  Likelihood of recurrent aspiration is high, I have explained this to daughters on multiple occassions but I'm not sure they grasp it.  Dispo: likely d/c home in the next 2 days  Roselie Awkward, MD [facility] Pager: [phone] Cell: [phone] If no response, call [phone]

## 2014-08-30 NOTE — Progress Notes (Signed)
As per Dr Meta Hatchet does not need to be on telemetry floor.

## 2014-08-30 NOTE — Progress Notes (Signed)
NUTRITION FOLLOW UP  Intervention:  -Per family request, holding Jevity 1.2 bolus advancement.; will likely advance on 12/04 -Continue Jevity 1.2 at 120 ml five times daily -Increased Pro-Stat to TID -FiberCon fiber supplement once daily -Imodium BID -RD to monitor daily   Nutrition Dx:   Inadequate oral intake related to inability to eat as evidenced by NPO status; ongoing  Goal:   TF to meet >/= 90% of their estimated nutrition needs; progressing  Monitor:   TF tolerance, GI profile, total protein/energy intake, labs, weights  Assessment:   11/29: Pt recently discharged on 11/13.  11/28 Daughters spoke with on-call RD regarding diarrhea and TF. TF was stopped d/t diarrhea. C.Diff pending. Per H&P, PTA patient was receiving 1 can (237 ml) of Jevity 1.2, 5 times daily. This provides 1422 kcal and 66 grams of protein.   11/29 Spoke with Kathe Becton, the pt's daughters regarding TF plan for pt. Pt's daughters request daily monitoring of pt.  Explained to family the plan per MD of trickle feeds of Jevity 1.2 pending c.diff results and chest scan.  Per MD note, pt with possible chronic aspiration despite PEG d/t hiatal hernia. Possible consideration of MBS. Per RN, MD may start trickle feeds at 20 ml/hr, would recommend restarting TF at a lower rate of 10 ml/hr, family concerned for pt's tolerance of continuous TF.  RD to follow-up in am to evaluate patient and tolerance of trickle feeds (if started).  11/30: -Pt negative for C.diff -RD received consult for enteral nutrition initiation, and request for additional fiber additives -Discussed TF with patient and pt's daughter. -Plan to initiate trickle feeds for 24 hour, and to advance as tolerated. Discussed fiber supplements available with pharmacy; PharmD noted available form in tablet is able to be crushed and administered via PEG.  -RD will order Fibercon once daily and increase to recommended dosage of two tablets daily as  needed  12/01: -RN reported pt without loose bowel movement yet during shift -Per discussion with family, pt had two small bowel movements yesterday and overnight -Discussed tube feeding regimen with NP. NP noted concern for reflux/aspiration r/t hiatal hernia. Requested to transition to home bolus regimen -Will trial half dosage (120 ml five times daily) and assess tolerance.  -Protein supplement added to better meet calorie and protein needs -Continue with FiberCon, RN reported she had not yet been given supplement, but planned on providing at [place] today -Pt may benefit from smaller frequent bolus w/Pro-Stat vs five 237 ml daily to assist with loose stools upon d/c. RD to continue to monitor to assess tolerance  12/02: -Pt's daughter reported two smear-like episodes of stool this morning and one pudding-like episode yesterday after bolus feed -Tolerating 120 ml QID, and receiving Pro-Stat BID. Received FiberCon daily since 11/30. Imodium ordered PRN, and given 11/30 -Will progress to home regimen, but modify volume to goal rate of 4.5 cans daily with addition of Pro-Stat BID  12/03: -Pt's daughter declining advancement of tube feed due to loose stools and nausea -Receiving Jevity 1.2 at 120 ml five times daily, and pro-stat BID. Advanced to TID while receiving half doses of tube feeding -Pt receiving Imodium BID, which daughter noted is assisting in forming stool -Consider regimen of 200 ml (approximately 6oz) five times daily with Pro-Stat TID to provide 1500 kcal, 85 gram protein if pt unable to tolerate 237 ml five times daily -Pt's daughter noted pt may enjoy four feedings vs five at home as she is tired when  the 5th bolus is scheduled.  Height: Ht Readings from Last 1 Encounters:  08/24/14 5\' 3"  (1.6 m)    Weight Status:   Wt Readings from Last 1 Encounters:  08/30/14 156 lb 8 oz (70.988 kg)    Re-estimated needs:  Kcal: 1400-1600 Protein: 80-95g Fluid: 1.5L/day  Skin:  Stage III Pressure ulcer on buttocks, open necrotic toe wound   Diet Order: Diet NPO time specified   Intake/Output Summary (Last 24 hours) at 08/30/14 1619 Last data filed at 08/30/14 0617  Gross per 24 hour  Intake    680 ml  Output      0 ml  Net    680 ml    Last BM: 12/02   Labs:   Recent Labs Lab 08/25/14 0527 08/26/14 0419 08/27/14 0345 08/29/14 1630 08/30/14 0535  NA 134* 135* 132* 138 139  K 4.0 3.4* 3.8 3.4* 3.8  CL 100 103 103 106 105  CO2 22 20 19 19 21   BUN 15 12 10 12 11   CREATININE 0.51 0.52 0.46* 0.48* 0.47*  CALCIUM 7.5* 7.3* 6.9* 7.4* 7.5*  MG 1.8 1.9  --   --  1.8  PHOS 2.6 1.7*  --   --   --   GLUCOSE 203* 125* 95 176* 91    CBG (last 3)   Recent Labs  08/30/14 0409 08/30/14 0721 08/30/14 1248  GLUCAP 74 130* 133*    Scheduled Meds: . antiseptic oral rinse  7 mL Mouth Rinse q12n4p  . aspirin  81 mg Per Tube Daily  . chlorhexidine  15 mL Mouth Rinse BID  . feeding supplement (JEVITY 1.2 CAL)  120 mL Per Tube 5 X Daily  . feeding supplement (PRO-STAT SUGAR FREE 64)  30 mL Per Tube TID WC  . free water  120 mL Per Tube QID  . furosemide  20 mg Oral Daily  . heparin  5,000 Units Subcutaneous 3 times per day  . insulin aspart  2-6 Units Subcutaneous 6 times per day  . levofloxacin (LEVAQUIN) IV  750 mg Intravenous Q24H  . metoprolol tartrate  6.25 mg Per Tube BID  . [START ON 08/31/2014] polycarbophil  625 mg Per Tube Daily  . potassium chloride  40 mEq Per Tube Daily  . prednisoLONE  4.5 mg Per Tube QAC breakfast  . ranitidine  150 mg Per Tube QHS  . saccharomyces boulardii  250 mg Oral BID    Continuous Infusions:    Atlee Abide MS RD LDN Clinical Dietitian [phone]

## 2014-08-31 ENCOUNTER — Inpatient Hospital Stay (HOSPITAL_COMMUNITY): Payer: Medicare Other

## 2014-08-31 LAB — CBC
HEMATOCRIT: 30.6 % — AB (ref 36.0–46.0)
Hemoglobin: 10.1 g/dL — ABNORMAL LOW (ref 12.0–15.0)
MCH: 30.9 pg (ref 26.0–34.0)
MCHC: 33 g/dL (ref 30.0–36.0)
MCV: 93.6 fL (ref 78.0–100.0)
PLATELETS: 132 10*3/uL — AB (ref 150–400)
RBC: 3.27 MIL/uL — AB (ref 3.87–5.11)
RDW: 21.6 % — ABNORMAL HIGH (ref 11.5–15.5)
WBC: 7 10*3/uL (ref 4.0–10.5)

## 2014-08-31 LAB — BASIC METABOLIC PANEL
Anion gap: 12 (ref 5–15)
BUN: 15 mg/dL (ref 6–23)
CHLORIDE: 105 meq/L (ref 96–112)
CO2: 22 mEq/L (ref 19–32)
Calcium: 7.8 mg/dL — ABNORMAL LOW (ref 8.4–10.5)
Creatinine, Ser: 0.49 mg/dL — ABNORMAL LOW (ref 0.50–1.10)
GFR calc Af Amer: >90 mL/min (ref >90)
GFR calc non Af Amer: 85 mL/min — ABNORMAL LOW (ref >90)
GLUCOSE: 102 mg/dL — AB (ref 70–99)
POTASSIUM: 4.1 meq/L (ref 3.7–5.3)
SODIUM: 139 meq/L (ref 137–147)

## 2014-08-31 LAB — GLUCOSE, CAPILLARY
GLUCOSE-CAPILLARY: 77 mg/dL (ref 70–99)
GLUCOSE-CAPILLARY: 137 mg/dL — AB (ref 70–99)
Glucose-Capillary: 116 mg/dL — ABNORMAL HIGH (ref 70–99)
Glucose-Capillary: 131 mg/dL — ABNORMAL HIGH (ref 70–99)
Glucose-Capillary: 160 mg/dL — ABNORMAL HIGH (ref 70–99)
Glucose-Capillary: 84 mg/dL (ref 70–99)

## 2014-08-31 LAB — MAGNESIUM: MAGNESIUM: 1.9 mg/dL (ref 1.5–2.5)

## 2014-08-31 MED ORDER — JEVITY 1.2 CAL PO LIQD
200.0000 mL | Freq: Every day | ORAL | Status: DC
  Administered 2014-09-01 – 2014-09-05 (×21): 200 mL
  Filled 2014-08-31 (×34): qty 237

## 2014-08-31 MED ORDER — JEVITY 1.2 CAL PO LIQD
120.0000 mL | Freq: Every day | ORAL | Status: AC
  Administered 2014-08-31 – 2014-09-01 (×3): 120 mL
  Filled 2014-08-31: qty 237

## 2014-08-31 NOTE — Progress Notes (Addendum)
NUTRITION FOLLOW UP  Intervention:  -Continue Jevity 1.2 at 120 ml five times daily -ON 12/05: Advance to Jevity 1.2 at goal rate of 200 ml (approximately 6 oz) five times daily with Pro-Stat to TID to provide 1500 kcal (100% est protein needs), 85 gram protein (100% est protein needs), and 807 ml free water. -On d/c, Recommend 60 ml flush before and after each bolus feeding, + 120 ml flush per day to meet estimated fluid needs  -FiberCon fiber supplement once daily -Imodium PRN -RD to monitor daily   Nutrition Dx:   Inadequate oral intake related to inability to eat as evidenced by NPO status; ongoing  Goal:   TF to meet >/= 90% of their estimated nutrition needs; progressing  Monitor:   TF tolerance, GI profile, total protein/energy intake, labs, weights  Assessment:   11/29: Pt recently discharged on 11/13.  11/28 Daughters spoke with on-call RD regarding diarrhea and TF. TF was stopped d/t diarrhea. C.Diff pending. Per H&P, PTA patient was receiving 1 can (237 ml) of Jevity 1.2, 5 times daily. This provides 1422 kcal and 66 grams of protein.   11/29 Spoke with Kathe Becton, the pt's daughters regarding TF plan for pt. Pt's daughters request daily monitoring of pt.  Explained to family the plan per MD of trickle feeds of Jevity 1.2 pending c.diff results and chest scan.  Per MD note, pt with possible chronic aspiration despite PEG d/t hiatal hernia. Possible consideration of MBS. Per RN, MD may start trickle feeds at 20 ml/hr, would recommend restarting TF at a lower rate of 10 ml/hr, family concerned for pt's tolerance of continuous TF.  RD to follow-up in am to evaluate patient and tolerance of trickle feeds (if started).  11/30: -Pt negative for C.diff -RD received consult for enteral nutrition initiation, and request for additional fiber additives -Discussed TF with patient and pt's daughter. -Plan to initiate trickle feeds for 24 hour, and to advance as tolerated.  Discussed fiber supplements available with pharmacy; PharmD noted available form in tablet is able to be crushed and administered via PEG.  -RD will order Fibercon once daily and increase to recommended dosage of two tablets daily as needed  12/01: -RN reported pt without loose bowel movement yet during shift -Per discussion with family, pt had two small bowel movements yesterday and overnight -Discussed tube feeding regimen with NP. NP noted concern for reflux/aspiration r/t hiatal hernia. Requested to transition to home bolus regimen -Will trial half dosage (120 ml five times daily) and assess tolerance.  -Protein supplement added to better meet calorie and protein needs -Continue with FiberCon, RN reported she had not yet been given supplement, but planned on providing at [place] today -Pt may benefit from smaller frequent bolus w/Pro-Stat vs five 237 ml daily to assist with loose stools upon d/c. RD to continue to monitor to assess tolerance  12/02: -Pt's daughter reported two smear-like episodes of stool this morning and one pudding-like episode yesterday after bolus feed -Tolerating 120 ml QID, and receiving Pro-Stat BID. Received FiberCon daily since 11/30. Imodium ordered PRN, and given 11/30 -Will progress to home regimen, but modify volume to goal rate of 4.5 cans daily with addition of Pro-Stat BID  12/03: -Pt's daughter declining advancement of tube feed due to loose stools and nausea -Receiving Jevity 1.2 at 120 ml five times daily, and pro-stat BID. Advanced to TID while receiving half doses of tube feeding -Pt receiving Imodium BID, which daughter noted is assisting in  forming stool -Consider regimen of 200 ml (approximately 6oz) five times daily with Pro-Stat TID to provide 1500 kcal, 85 gram protein if pt unable to tolerate 237 ml five times daily -Pt's daughter noted pt may enjoy four feedings vs five at home as she is tired when the 5th bolus is scheduled.  12/04: -Family  continues to decline advancement d/t pt feeling full after 4oz, and with new fever.  -Loose stools improving with BM. Continue with Imodium PRN -Will place instructions to advance tomorrow  Height: Ht Readings from Last 1 Encounters:  08/24/14 5\' 3"  (1.6 m)    Weight Status:   Wt Readings from Last 1 Encounters:  08/31/14 153 lb 4.8 oz (69.536 kg)    Re-estimated needs:  Kcal: 1400-1600 Protein: 80-95g Fluid: 1.5L/day  Skin: Stage III Pressure ulcer on buttocks, open necrotic toe wound   Diet Order: Diet NPO time specified   Intake/Output Summary (Last 24 hours) at 08/31/14 1452 Last data filed at 08/31/14 0750  Gross per 24 hour  Intake    870 ml  Output      0 ml  Net    870 ml    Last BM: 12/03   Labs:   Recent Labs Lab 08/25/14 0527 08/26/14 0419  08/29/14 1630 08/30/14 0535 08/31/14 0527  NA 134* 135*  < > 138 139 139  K 4.0 3.4*  < > 3.4* 3.8 4.1  CL 100 103  < > 106 105 105  CO2 22 20  < > 19 21 22   BUN 15 12  < > 12 11 15   CREATININE 0.51 0.52  < > 0.48* 0.47* 0.49*  CALCIUM 7.5* 7.3*  < > 7.4* 7.5* 7.8*  MG 1.8 1.9  --   --  1.8 1.9  PHOS 2.6 1.7*  --   --   --   --   GLUCOSE 203* 125*  < > 176* 91 102*  < > = values in this interval not displayed.  CBG (last 3)   Recent Labs  08/31/14 0435 08/31/14 0818 08/31/14 1202  GLUCAP 77 137* 160*    Scheduled Meds: . antiseptic oral rinse  7 mL Mouth Rinse q12n4p  . aspirin  81 mg Per Tube Daily  . chlorhexidine  15 mL Mouth Rinse BID  . feeding supplement (JEVITY 1.2 CAL)  120-200 mL Per Tube 5 X Daily  . feeding supplement (PRO-STAT SUGAR FREE 64)  30 mL Per Tube TID WC  . free water  120 mL Per Tube QID  . furosemide  20 mg Oral Daily  . heparin  5,000 Units Subcutaneous 3 times per day  . insulin aspart  2-6 Units Subcutaneous 6 times per day  . levofloxacin (LEVAQUIN) IV  750 mg Intravenous Q24H  . metoprolol tartrate  6.25 mg Per Tube BID  . polycarbophil  625 mg Per Tube Daily   . potassium chloride  40 mEq Per Tube Daily  . prednisoLONE  4.5 mg Per Tube QAC breakfast  . ranitidine  150 mg Per Tube QHS  . saccharomyces boulardii  250 mg Oral BID    Continuous Infusions:    Atlee Abide MS RD LDN Clinical Dietitian [phone]

## 2014-08-31 NOTE — Progress Notes (Signed)
ANTIBIOTIC CONSULT NOTE - FOLLOW UP  Pharmacy Consult for Levaquin Indication: pneumonia  Allergies  Allergen Reactions  . Doxycycline Nausea And Vomiting  . Other Nausea And Vomiting and Other (See Comments)    Anesthesia makes her very sleepy and makes it hard for her to come out of it.   Marland Kitchen Penicillins Hives and Nausea And Vomiting  . Sulfa Antibiotics Nausea And Vomiting    Patient Measurements: Height: 5\' 3"  (160 cm) Weight: 153 lb 4.8 oz (69.536 kg) IBW/kg (Calculated) : 52.4   Assessment: 89 YOF with interstitial lung disease, presumed IPF, polymyalgia on chronic prednisone, chronic diastolic heart failure and severe dysphagia status post PEG presents with her third episode of HCAP in the last 6 weeks, with severe sepsis, lactate of 6.Pt complained of sore throat and facial rash.Possible pneumonia on CXR. Patient was started on Vancomycin, Aztreonam, Levaquin.Therapy narrowed to Levaquin 12/1 for HCAP, possibly aspiration pneumonia.  Antiinfectives 11/27 >> vancomycin >> 12/1 11/27 >> aztreonam >> 12/1 11/27 >> levofloxacin >>  Labs / vitals Tmax: now afebrile WBCs: WNL Renal: SCr WNL, CrCl ~55CG/N (using Scr = 0.8 for age) LA: 6.02 -> 3.41  Microbiology 11/27 blood: NGF 11/27 urine: NGF 11/27 Group A Strept cx: neg 11/28 C.diff: neg 11/27 MRSA screen neg  Drug level / dose changes info: 11/28: increase levaquin to 750mg  Q24H for renal function 11/29: VT 1000: 10.5 on vanc 500mg  Q12H increase to 750mg  Q12H  Today is day #8/x Levaquin 750 mg IV q24h for HCAP, possibly aspiration pna.  Chest Xray this AM showed persistent RUL infiltrate.  Goal of Therapy:  Doses adjusted per renal function Eradication of infection  Plan:  1.  Continue Levaquin 750 mg IV q24h. 2.  F/u planned duration of treatment.  Thank you for the consult.  Currie Paris, PharmD, BCPS Pager: [phone] Pharmacy: [phone] 08/31/2014 9:50 AM

## 2014-08-31 NOTE — Progress Notes (Signed)
   08/31/14 1400  PT Visit Information  PT NOTE  Last PT Received On 08/31/14  Assistance Needed +1  Reason Eval/Treat Not Completed Pt seen for PT tx today, see note in paper/ghost chart for details; This note entered for Kenyon Ana, PT

## 2014-08-31 NOTE — Progress Notes (Signed)
- Name: Martha Mcbride MRN: [id number] DOB: [birth year]    ADMISSION DATE:  08/24/2014  REFERRING MD :  EDP  CHIEF COMPLAINT:  Generalized weakness, fever  BRIEF PATIENT DESCRIPTION: 79 year old with interstitial lung disease, presumed IPF, polymyalgia on chronic prednisone, chronic diastolic heart failure and severe dysphagia status post PEG presents with her third episode of HCAP in the last 6 weeks, with severe sepsis, lactate of 6.  She also had a new malar rash on admission.   SIGNIFICANT EVENTS  11/28  Frequent diarrhea, CDiff neg, TF held  12/02  Moved to floor 12/03  Ongoing loose stools, imodium helping 12/04  Diarrhea improved, cxr unchanged.  Temp 100.7 rectal  STUDIES:  07/30/14 CT Chest >> Evidence of emphysema with traction bronchiectasis in multiple areas of interstitial fibrosis. Appearance is consistent with underlying usual interstitial pneumonitis. There are areas of patchy consolidation in both lower lobes as wellas to a lesser extent in the upper lobes. There also moderate pleural effusions. The areas of consolidation are felt to most likely represent superimposed pneumonia. There may be a degree of superimposed congestive heart failure as well given the pleural effusions .5 mm nodular opacity in the posterior segment of the right upper lobe. Areas of atherosclerotic change and cardiac valvular calcification. Areas of relatively mild adenopathy. Cholelithiasis. 11/30 DG UGI with KUB >> well positioned g-tube, no extravasation of contrast media, opacification of moderate sized hiatal hernia with reflux to the level of the thoracic inlet while supine, no aspiration observed, incidental finding of duodenal diverticula    Historical Data: 04/2012 CT abd >>large hiatal hernia . Prominent interstitial lung markings suggest chronic changes  06/2014 TEE mild MS, mod AS, nml LVfn 07/24/14  Admit for HCAP, encephalopathy, & dHF exacerbation.  07/28/14 Admit with fever 103,  confusion,productive cough.  WBC 12, Lactic acid at 3.5. Chest x ray with diffuse bilateral pulmonary interstitial infiltrates & eosinophilia (AEC 1800 on 10/22 which came down to 600 on 10/31). Diarrhea is resolved & felt unlikely to be parasitic by ID - stool O & P neg. 07/29/14  Ova and parasite stool neg. Strongyloides antibody neg, Quantiferon Tb neg    SUBJECTIVE:  RN reports improved diarrhea.  Family at bedside with similar questions as days prior.  No new events.   VITAL SIGNS: Temp:  [97.2 F (36.2 C)-100.7 F (38.2 C)] 100.2 F (37.9 C) (12/04 1150) Pulse Rate:  [67-101] 101 (12/04 0438) Resp:  [16-20] 16 (12/04 0438) BP: (96-110)/(45-62) 101/45 mmHg (12/04 0438) SpO2:  [95 %-100 %] 95 % (12/04 0438) Weight:  [153 lb 4.8 oz (69.536 kg)] 153 lb 4.8 oz (69.536 kg) (12/04 0438)  PHYSICAL EXAMINATION: Gen. Chronically ill, elderly woman in no distress, normal affect ENT - no lesions, decreased hearing, wears hearing aides Lungs: no use of accessory muscles, bilateral rales improved   Cardiovascular: Rhythm regular, heart sounds normal, no murmurs, trace peripheral edema Abdomen: soft and non-tender, BS normal. Musculoskeletal: No deformities, no cyanosis or clubbing Neuro:  alert, non focal Skin:  Warm, resolving rash    Recent Labs Lab 08/29/14 1630 08/30/14 0535 08/31/14 0527  NA 138 139 139  K 3.4* 3.8 4.1  CL 106 105 105  CO2 _0 BUN _1 CREATININE 0.48* 0.47* 0.49*  GLUCOSE 176* 91 102*    Recent Labs Lab 08/26/14 0419 08/30/14 0535 08/31/14 0527  HGB 10.6* 10.2* 10.1*  HCT 33.0* 30.8* 30.6*  WBC 8.0 7.3 7.0  PLT 124* 116*  132*   Dg Chest Port 1 View  08/31/2014   CLINICAL DATA:  Sepsis.  Pneumonia.  EXAM: PORTABLE CHEST - 1 VIEW  COMPARISON:  09/19/2014.  FINDINGS: Mediastinum unremarkable. Cardiomegaly with pulmonary vascular prominence. Diffuse increase pulmonary interstitial markings are noted bilaterally. congestive heart failure  could present this fashion. Persistent right upper lobe infiltrate. Very low lung volumes with basilar atelectasis. Underlying chronic interstitial lung disease. No pleural effusion or pneumothorax. No acute osseus abnormality.  IMPRESSION: 1. Cardiomegaly with interim appearance of pulmonary venous congestion and diffuse interstitial prominence suggesting congestive heart failure. 2. Persistent right upper lobe infiltrate. 3. Very low lung volumes with prominent basilar atelectasis. Chronic interstitial lung disease.   Electronically Signed   By: Marcello Moores  Register   On: 08/31/2014 07:21   Dg Chest Port 1 View  08/30/2014   CLINICAL DATA:  Aspiration pneumonia.  EXAM: PORTABLE CHEST - 1 VIEW  COMPARISON:  08/27/2014.  02/10/2014.  FINDINGS: Mediastinum and hilar structures are normal. Stable cardiomegaly. Stable mitral annular calcification. Pulmonary vascularity normal. Progression of right upper lobe infiltrate consistent with pneumonia. Persistent changes of chronic interstitial lung disease. No prominent pleural effusion. No pneumothorax. No acute osseus abnormality.  IMPRESSION: 1. Progression of right upper lobe infiltrate. 2. Stable changes of chronic interstitial lung disease. 3. Stable cardiomegaly with normal pulmonary vascularity.   Electronically Signed   By: Marcello Moores  Register   On: 08/30/2014 07:36    ASSESSMENT / PLAN:  Healthcare associated pneumonia - recurrent, likely aspiration.   Suspected chronic aspiration Suspected superimposed cardiogenic pulm edema Underlying interstitial lung disease, presumed IPF  New malar rash -  spread to scalp, arms only.  Resolved.  Sore throat - prior to admission   Plan: Continue Levaquin D8/10  abx - penicillin allergy noted.  Afebrile, no WBC.  Continue home dose lasix 20 mg QD  Severe sepsis - responded to fluids, resolved.  CHF - without decompensation  Plan: Continue home Metoprolol  Lasix as above Repeat labs in am  HR <110  acceptable  Dysphagia - keep nothing by mouth for now.  Last SLP evaluation 11/3 notable for aspiration of nectar thick liquids, aspiration of secretions with barium without awareness.  11/30 DG UGI with aspiration to level of thoracic inlet without overt aspiration but further supports concerns of chronic aspiration Hiatal Hernia - likely still aspirating, in spite of her PEG due to hiatal hernia, consider further evaluation.  Last SLP notes reflected aspiration of barium without awareness.  Protein Calorie Malnutrition GERD  Plan: TF, bolus feedings to mimic home feeding.    Protein (will reduce overall volume), fiber supplement Patient has not missed oral feedings, is ok with bolus feeding from PEG Not likely a candidate for surgical repair of hernia Free water 120 ml PT QID, monitor sodium  Polymyalgia   Plan:  Continue 4.5 mg QD pred, was being slowly tapered, last dose noted to be 4.5 mg QD per med rec Review dose prednisone in office with Dr. Elsworth Soho  Diarrhea - likely due to combo abx, TF's.  Resolved.   Plan: PRN imodium  Nutrition to add fiber to TF formula, see rec's above Florastor   Rash - erythematous, diffuse on face, arms, circular, blanching. NOT on trunk or LE's.  ESR 45, ANA positive 1:40, speckled pattern. Rash resolved 12/2.  Plan: Monitor  Sacral Decubitus, Stage III - had prior to admission.   Dry Gangrene of R Toes   Plan: WOC following Monitor toes, no acute interventions.  Previously seen by [facility].   Deconditioning  Dementia - mild  Plan: PT Consult Mobilize, out of bed BID, pt has been reluctant due to sacral ulcer.   GLOBAL: Family updated extensively at bedside.  Continue to push PT & nutritional efforts.  Improved diarrhea.  Consider d/c in am 12/5 pending temp review.    Noe Gens, NP-C [facility] Pgr: [phone] or [phone]

## 2014-08-31 NOTE — Care Management Note (Signed)
08/31/14 Marney Doctor RN,BSN,NCM [phone]  IM letter given to patient.

## 2014-09-01 ENCOUNTER — Inpatient Hospital Stay (HOSPITAL_COMMUNITY): Payer: Medicare Other

## 2014-09-01 DIAGNOSIS — J8410 Pulmonary fibrosis, unspecified: Secondary | ICD-10-CM

## 2014-09-01 DIAGNOSIS — R197 Diarrhea, unspecified: Secondary | ICD-10-CM

## 2014-09-01 LAB — GLUCOSE, CAPILLARY
GLUCOSE-CAPILLARY: 96 mg/dL (ref 70–99)
Glucose-Capillary: 150 mg/dL — ABNORMAL HIGH (ref 70–99)
Glucose-Capillary: 81 mg/dL (ref 70–99)
Glucose-Capillary: 114 mg/dL — ABNORMAL HIGH (ref 70–99)
Glucose-Capillary: 196 mg/dL — ABNORMAL HIGH (ref 70–99)
Glucose-Capillary: 81 mg/dL (ref 70–99)

## 2014-09-01 LAB — CBC
HCT: 30.8 % — ABNORMAL LOW (ref 36.0–46.0)
Hemoglobin: 10 g/dL — ABNORMAL LOW (ref 12.0–15.0)
MCH: 30.6 pg (ref 26.0–34.0)
MCHC: 32.5 g/dL (ref 30.0–36.0)
MCV: 94.2 fL (ref 78.0–100.0)
PLATELETS: 131 10*3/uL — AB (ref 150–400)
RBC: 3.27 MIL/uL — ABNORMAL LOW (ref 3.87–5.11)
RDW: 21.7 % — AB (ref 11.5–15.5)
WBC: 6.4 10*3/uL (ref 4.0–10.5)

## 2014-09-01 LAB — BASIC METABOLIC PANEL
Anion gap: 12 (ref 5–15)
BUN: 16 mg/dL (ref 6–23)
CALCIUM: 7.9 mg/dL — AB (ref 8.4–10.5)
CO2: 22 mEq/L (ref 19–32)
CREATININE: 0.47 mg/dL — AB (ref 0.50–1.10)
Chloride: 104 mEq/L (ref 96–112)
GFR calc Af Amer: >90 mL/min (ref >90)
GFR calc non Af Amer: 86 mL/min — ABNORMAL LOW (ref >90)
Glucose, Bld: 86 mg/dL (ref 70–99)
Potassium: 4.5 mEq/L (ref 3.7–5.3)
Sodium: 138 mEq/L (ref 137–147)

## 2014-09-01 MED ORDER — INSULIN ASPART 100 UNIT/ML ~~LOC~~ SOLN
2.0000 [IU] | SUBCUTANEOUS | Status: DC
  Administered 2014-09-02 – 2014-09-03 (×3): 2 [IU] via SUBCUTANEOUS
  Administered 2014-09-04: 4 [IU] via SUBCUTANEOUS
  Administered 2014-09-04: 6 [IU] via SUBCUTANEOUS
  Administered 2014-09-04: 4 [IU] via SUBCUTANEOUS
  Administered 2014-09-04 – 2014-09-05 (×3): 2 [IU] via SUBCUTANEOUS
  Administered 2014-09-05: 6 [IU] via SUBCUTANEOUS

## 2014-09-01 NOTE — Progress Notes (Signed)
- Name: Martha Mcbride MRN: [id number] DOB: Jul 18, 1925    ADMISSION DATE:  08/24/2014  REFERRING MD :  EDP  CHIEF COMPLAINT:  Generalized weakness, fever  BRIEF PATIENT DESCRIPTION: 80 year old with interstitial lung disease, presumed IPF, polymyalgia on chronic prednisone, chronic diastolic heart failure and severe dysphagia status post PEG presents with her third episode of HCAP in the last 6 weeks, with severe sepsis, lactate of 6.  She also had a new malar rash on admission.   SIGNIFICANT EVENTS  11/28  Frequent diarrhea, CDiff neg, TF held  12/02  Moved to floor 12/03  Ongoing loose stools, imodium helping 12/04  Diarrhea improved, cxr unchanged.  Temp 100.7 rectal  STUDIES:  07/30/14 CT Chest >> Evidence of emphysema with traction bronchiectasis in multiple areas of interstitial fibrosis. Appearance is consistent with underlying usual interstitial pneumonitis. There are areas of patchy consolidation in both lower lobes as wellas to a lesser extent in the upper lobes. There also moderate pleural effusions. The areas of consolidation are felt to most likely represent superimposed pneumonia. There may be a degree of superimposed congestive heart failure as well given the pleural effusions .5 mm nodular opacity in the posterior segment of the right upper lobe. Areas of atherosclerotic change and cardiac valvular calcification. Areas of relatively mild adenopathy. Cholelithiasis. 11/30 DG UGI with KUB >> well positioned g-tube, no extravasation of contrast media, opacification of moderate sized hiatal hernia with reflux to the level of the thoracic inlet while supine, no aspiration observed, incidental finding of duodenal diverticula    Historical Data: 04/2012 CT abd >>large hiatal hernia . Prominent interstitial lung markings suggest chronic changes  06/2014 TEE mild MS, mod AS, nml LVfn 07/24/14  Admit for HCAP, encephalopathy, & dHF exacerbation.  07/28/14 Admit with fever 103,  confusion,productive cough.  WBC 12, Lactic acid at 3.5. Chest x ray with diffuse bilateral pulmonary interstitial infiltrates & eosinophilia (AEC 1800 on 10/22 which came down to 600 on 10/31). Diarrhea is resolved & felt unlikely to be parasitic by ID - stool O & P neg. 07/29/14  Ova and parasite stool neg. Strongyloides antibody neg, Quantiferon Tb neg    SUBJECTIVE:   Pt stable overnight.  Had one episode of diarrhea this am but resolved with one dose of immodium.  Tolerating tube feeds well.  No increased wob.   VITAL SIGNS: Temp:  [98.2 F (36.8 C)] 98.2 F (36.8 C) (12/05 1339) Pulse Rate:  [101] 101 (12/05 1339) Resp:  [16] 16 (12/05 1339) BP: (107)/(52) 107/52 mmHg (12/05 1339) SpO2:  [95 %] 95 % (12/05 1339)  PHYSICAL EXAMINATION: Gen. Chronically ill, elderly woman in no distress ENT - nose without purulence, no LN or TMG Lungs: bibasilar crackles, no wheezing   Cardiovascular: rrr, 3/6 blowing systolic murmur Abdomen: soft and non-tender, BS normal. LE with mild edema, no cyanosis Neuro:  alert, non focal    Recent Labs Lab 08/30/14 0535 08/31/14 0527 09/01/14 0512  NA 139 139 138  K 3.8 4.1 4.5  CL 105 105 104  CO2 21 22 22   BUN 11 15 16   CREATININE 0.47* 0.49* 0.47*  GLUCOSE 91 102* 86    Recent Labs Lab 08/30/14 0535 08/31/14 0527 09/01/14 0512  HGB 10.2* 10.1* 10.0*  HCT 30.8* 30.6* 30.8*  WBC 7.3 7.0 6.4  PLT 116* 132* 131*     ASSESSMENT / PLAN:  Healthcare associated pneumonia - recurrent, likely aspiration.   Suspected chronic aspiration Suspected superimposed cardiogenic pulm edema Underlying  interstitial lung disease, presumed IPF  New malar rash -  spread to scalp, arms only.  Resolved.  Sore throat - prior to admission   Plan: Continue Levaquin D9/10  abx - penicillin allergy noted.  Afebrile, no WBC.  Continue home dose lasix 20 mg QD   CHF - without decompensation  Plan: Continue home Metoprolol  Lasix as  above  Dysphagia - keep nothing by mouth for now.  Last SLP evaluation 11/3 notable for aspiration of nectar thick liquids, aspiration of secretions with barium without awareness.  11/30 DG UGI with aspiration to level of thoracic inlet without overt aspiration but further supports concerns of chronic aspiration  Hiatal Hernia - likely still aspirating, in spite of her PEG due to hiatal hernia, consider further evaluation.  Last SLP notes reflected aspiration of barium without awareness.    Protein Calorie Malnutrition GERD  Plan: TF, bolus feedings to mimic home feeding.    Protein (will reduce overall volume), fiber supplement Patient has not missed oral feedings, is ok with bolus feeding from PEG Not likely a candidate for surgical repair of hernia See speech d/c recs in notes.  Polymyalgia   Plan:  Continue 4.5 mg QD pred, was being slowly tapered, last dose noted to be 4.5 mg QD per med rec   Diarrhea - likely due to combo abx, TF's.  Resolved.   Plan: PRN imodium  Nutrition to add fiber to TF formula, see rec's above Florastor    Sacral Decubitus, Stage III - had prior to admission.   Dry Gangrene of R Toes   Plan: WOC following Monitor toes, no acute interventions.  Previously seen by [facility].   Deconditioning  Dementia - mild  Plan: PT Consult Mobilize, out of bed BID, pt has been reluctant due to sacral ulcer.

## 2014-09-01 NOTE — Plan of Care (Signed)
Problem: Phase I Progression Outcomes Goal: Dyspnea controlled at rest Outcome: Progressing Goal: Pain controlled with appropriate interventions Outcome: Progressing  Problem: Phase II Progression Outcomes Goal: Encourage coughing & deep breathing Outcome: Progressing

## 2014-09-02 LAB — GLUCOSE, CAPILLARY
GLUCOSE-CAPILLARY: 105 mg/dL — AB (ref 70–99)
Glucose-Capillary: 126 mg/dL — ABNORMAL HIGH (ref 70–99)
Glucose-Capillary: 116 mg/dL — ABNORMAL HIGH (ref 70–99)
Glucose-Capillary: 105 mg/dL — ABNORMAL HIGH (ref 70–99)
Glucose-Capillary: 95 mg/dL (ref 70–99)

## 2014-09-02 NOTE — Progress Notes (Signed)
NUTRITION FOLLOW UP  Intervention:  -Continue Jevity 1.2 at goal rate of 200 ml (approximately 6 oz) five times daily with Pro-Stat to TID to provide 1500 kcal (100% est protein needs), 85 gram protein (100% est protein needs), and 807 ml free water. -On d/c, Recommend 60 ml flush before and after each bolus feeding, + 120 ml flush per day to meet estimated fluid needs  -FiberCon fiber supplement once daily -Imodium PRN -RD to monitor daily   Nutrition Dx:   Inadequate oral intake related to inability to eat as evidenced by NPO status; ongoing  Goal:   TF to meet >/= 90% of their estimated nutrition needs; progressing  Monitor:   TF tolerance, GI profile, total protein/energy intake, labs, weights  Assessment:   11/29: Pt recently discharged on 11/13.  11/28 Daughters spoke with on-call RD regarding diarrhea and TF. TF was stopped d/t diarrhea. C.Diff pending. Per H&P, PTA patient was receiving 1 can (237 ml) of Jevity 1.2, 5 times daily. This provides 1422 kcal and 66 grams of protein.   11/29 Spoke with Kathe Becton, the pt's daughters regarding TF plan for pt. Pt's daughters request daily monitoring of pt.  Explained to family the plan per MD of trickle feeds of Jevity 1.2 pending c.diff results and chest scan.  Per MD note, pt with possible chronic aspiration despite PEG d/t hiatal hernia. Possible consideration of MBS. Per RN, MD may start trickle feeds at 20 ml/hr, would recommend restarting TF at a lower rate of 10 ml/hr, family concerned for pt's tolerance of continuous TF.  RD to follow-up in am to evaluate patient and tolerance of trickle feeds (if started).  11/30: -Pt negative for C.diff -RD received consult for enteral nutrition initiation, and request for additional fiber additives -Discussed TF with patient and pt's daughter. -Plan to initiate trickle feeds for 24 hour, and to advance as tolerated. Discussed fiber supplements available with pharmacy; PharmD noted  available form in tablet is able to be crushed and administered via PEG.  -RD will order Fibercon once daily and increase to recommended dosage of two tablets daily as needed  12/01: -RN reported pt without loose bowel movement yet during shift -Per discussion with family, pt had two small bowel movements yesterday and overnight -Discussed tube feeding regimen with NP. NP noted concern for reflux/aspiration r/t hiatal hernia. Requested to transition to home bolus regimen -Will trial half dosage (120 ml five times daily) and assess tolerance.  -Protein supplement added to better meet calorie and protein needs -Continue with FiberCon, RN reported she had not yet been given supplement, but planned on providing at [facility] today -Pt may benefit from smaller frequent bolus w/Pro-Stat vs five 237 ml daily to assist with loose stools upon d/c. RD to continue to monitor to assess tolerance  12/02: -Pt's daughter reported two smear-like episodes of stool this morning and one pudding-like episode yesterday after bolus feed -Tolerating 120 ml QID, and receiving Pro-Stat BID. Received FiberCon daily since 11/30. Imodium ordered PRN, and given 11/30 -Will progress to home regimen, but modify volume to goal rate of 4.5 cans daily with addition of Pro-Stat BID  12/03: -Pt's daughter declining advancement of tube feed due to loose stools and nausea -Receiving Jevity 1.2 at 120 ml five times daily, and pro-stat BID. Advanced to TID while receiving half doses of tube feeding -Pt receiving Imodium BID, which daughter noted is assisting in forming stool -Consider regimen of 200 ml (approximately 6oz) five times daily  with Pro-Stat TID to provide 1500 kcal, 85 gram protein if pt unable to tolerate 237 ml five times daily -Pt's daughter noted pt may enjoy four feedings vs five at home as she is tired when the 5th bolus is scheduled.  12/04: -Family continues to decline advancement d/t pt feeling full after 4oz, and  with new fever.  -Loose stools improving with BM. Continue with Imodium PRN -Will place instructions to advance tomorrow  12/6: -Pt with some diarrhea today. Imodium is provided PRN. -Pt resting during visit. Daughter Haroldine Redler Janus in room. -Family is fine with current rate of 200 ml 5 times a day. -Family with no questions at this time.  Height: Ht Readings from Last 1 Encounters:  08/24/14 5\' 3"  (1.6 m)    Weight Status:   Wt Readings from Last 1 Encounters:  09/02/14 145 lb 6.4 oz (65.953 kg)    Re-estimated needs:  Kcal: 1400-1600 Protein: 80-95g Fluid: 1.5L/day  Skin: Stage III Pressure ulcer on buttocks, open necrotic toe wound   Diet Order: Diet NPO time specified   Intake/Output Summary (Last 24 hours) at 09/02/14 1316 Last data filed at 09/02/14 2595  Gross per 24 hour  Intake    280 ml  Output    250 ml  Net     30 ml    Last BM: 12/5   Labs:   Recent Labs Lab 08/30/14 0535 08/31/14 0527 09/01/14 0512  NA 139 139 138  K 3.8 4.1 4.5  CL 105 105 104  CO2 21 22 22   BUN 11 15 16   CREATININE 0.47* 0.49* 0.47*  CALCIUM 7.5* 7.8* 7.9*  MG 1.8 1.9  --   GLUCOSE 91 102* 86    CBG (last 3)   Recent Labs  09/01/14 2155 09/02/14 0625 09/02/14 1007  GLUCAP 81 105* 126*    Scheduled Meds: . antiseptic oral rinse  7 mL Mouth Rinse q12n4p  . aspirin  81 mg Per Tube Daily  . chlorhexidine  15 mL Mouth Rinse BID  . feeding supplement (JEVITY 1.2 CAL)  200 mL Per Tube 5 X Daily  . feeding supplement (PRO-STAT SUGAR FREE 64)  30 mL Per Tube TID WC  . free water  120 mL Per Tube QID  . furosemide  20 mg Oral Daily  . heparin  5,000 Units Subcutaneous 3 times per day  . insulin aspart  2-6 Units Subcutaneous 5 times per day  . metoprolol tartrate  6.25 mg Per Tube BID  . polycarbophil  625 mg Per Tube Daily  . potassium chloride  40 mEq Per Tube Daily  . prednisoLONE  4.5 mg Per Tube QAC breakfast  . ranitidine  150 mg Per Tube QHS  . saccharomyces  boulardii  250 mg Oral BID    Continuous Infusions:    Clayton Bibles, MS, RD, LDN Pager: [phone] After Hours Pager: [phone]

## 2014-09-02 NOTE — Progress Notes (Signed)
- Name: Martha Mcbride MRN: [id number] DOB: 05-29-1925    ADMISSION DATE:  08/24/2014  REFERRING MD :  EDP  CHIEF COMPLAINT:  Generalized weakness, fever  BRIEF PATIENT DESCRIPTION: 80 year old with interstitial lung disease, presumed IPF, polymyalgia on chronic prednisone, chronic diastolic heart failure and severe dysphagia status post PEG presents with her third episode of HCAP in the last 6 weeks, with severe sepsis, lactate of 6.  She also had a new malar rash on admission.   SIGNIFICANT EVENTS  11/28  Frequent diarrhea, CDiff neg, TF held  12/02  Moved to floor 12/03  Ongoing loose stools, imodium helping 12/04  Diarrhea improved, cxr unchanged.  Temp 100.7 rectal  STUDIES:  07/30/14 CT Chest >> Evidence of emphysema with traction bronchiectasis in multiple areas of interstitial fibrosis. Appearance is consistent with underlying usual interstitial pneumonitis. There are areas of patchy consolidation in both lower lobes as wellas to a lesser extent in the upper lobes. There also moderate pleural effusions. The areas of consolidation are felt to most likely represent superimposed pneumonia. There may be a degree of superimposed congestive heart failure as well given the pleural effusions .5 mm nodular opacity in the posterior segment of the right upper lobe. Areas of atherosclerotic change and cardiac valvular calcification. Areas of relatively mild adenopathy. Cholelithiasis. 11/30 DG UGI with KUB >> well positioned g-tube, no extravasation of contrast media, opacification of moderate sized hiatal hernia with reflux to the level of the thoracic inlet while supine, no aspiration observed, incidental finding of duodenal diverticula    Historical Data: 04/2012 CT abd >>large hiatal hernia . Prominent interstitial lung markings suggest chronic changes  06/2014 TEE mild MS, mod AS, nml LVfn 07/24/14  Admit for HCAP, encephalopathy, & dHF exacerbation.  07/28/14 Admit with fever 103,  confusion,productive cough.  WBC 12, Lactic acid at 3.5. Chest x ray with diffuse bilateral pulmonary interstitial infiltrates & eosinophilia (AEC 1800 on 10/22 which came down to 600 on 10/31). Diarrhea is resolved & felt unlikely to be parasitic by ID - stool O & P neg. 07/29/14  Ova and parasite stool neg. Strongyloides antibody neg, Quantiferon Tb neg    SUBJECTIVE:   No issues overnight.  Tolerating tube feeds well, only having 2 stools a day at this point.   No increased sob, excellent sats on RA   VITAL SIGNS: Temp:  [98.4 F (36.9 C)-100.6 F (38.1 C)] 98.4 F (36.9 C) (12/06 0545) Pulse Rate:  [91-98] 91 (12/06 0545) Resp:  [20] 20 (12/06 0545) BP: (102-116)/(53-58) 116/58 mmHg (12/06 0545) SpO2:  [95 %-98 %] 98 % (12/06 0545) Weight:  [65.953 kg (145 lb 6.4 oz)] 65.953 kg (145 lb 6.4 oz) (12/06 0545)  PHYSICAL EXAMINATION: Gen. Chronically ill, elderly woman in no distress ENT - nose without purulence, no LN or TMG Lungs: bibasilar crackles, decreased depth inspiration, no wheezing   Cardiovascular: rrr, 3/6 blowing systolic murmur Abdomen: soft and non-tender, BS normal. LE with mild edema, no cyanosis, abnormal toes. Neuro:  alert, non focal    Recent Labs Lab 08/30/14 0535 08/31/14 0527 09/01/14 0512  NA 139 139 138  K 3.8 4.1 4.5  CL 105 105 104  CO2 21 22 22   BUN 11 15 16   CREATININE 0.47* 0.49* 0.47*  GLUCOSE 91 102* 86    Recent Labs Lab 08/30/14 0535 08/31/14 0527 09/01/14 0512  HGB 10.2* 10.1* 10.0*  HCT 30.8* 30.6* 30.8*  WBC 7.3 7.0 6.4  PLT 116* 132* 131*  ASSESSMENT / PLAN:  Healthcare associated pneumonia - recurrent, likely aspiration.   Suspected chronic aspiration Suspected superimposed cardiogenic pulm edema Underlying interstitial lung disease, presumed IPF  New malar rash -  spread to scalp, arms only.  Resolved.  Sore throat - prior to admission  Chest xray yest showed improved edema, and only small effusions.    Plan: Continue Levaquin (final day today?) Continue home dose lasix 20 mg QD   CHF - without decompensation  Plan: Continue home Metoprolol  Lasix as above  Dysphagia - keep nothing by mouth for now.  Last SLP evaluation 11/3 notable for aspiration of nectar thick liquids, aspiration of secretions with barium without awareness.  11/30 DG UGI with aspiration to level of thoracic inlet without overt aspiration but further supports concerns of chronic aspiration.  Doing well with tube feeds.   Hiatal Hernia - likely still aspirating, in spite of her PEG due to hiatal hernia, consider further evaluation.  Last SLP notes reflected aspiration of barium without awareness.  ?needs PEJ??   Protein Calorie Malnutrition GERD  Plan: TF, bolus feedings to mimic home feeding.    Protein (will reduce overall volume), fiber supplement Patient has not missed oral feedings, is ok with bolus feeding from PEG Not likely a candidate for surgical repair of hernia See speech d/c recs in notes.  Polymyalgia   Plan:  Continue 4.5 mg QD pred, was being slowly tapered, last dose noted to be 4.5 mg QD per med rec   Diarrhea - likely due to combo abx, TF's.  Resolved.   Plan: PRN imodium .  Only requiring 2 doses per day per nursing.  Nutrition to add fiber to TF formula, see rec's above Florastor    Sacral Decubitus, Stage III - had prior to admission.   Dry Gangrene of R Toes   Plan: WOC following Monitor toes, no acute interventions.  Previously seen by [facility].   Deconditioning  Dementia - mild  Plan: PT Consult Mobilize, out of bed BID, pt has been reluctant due to sacral ulcer.

## 2014-09-03 DIAGNOSIS — T17908A Unspecified foreign body in respiratory tract, part unspecified causing other injury, initial encounter: Secondary | ICD-10-CM | POA: Insufficient documentation

## 2014-09-03 DIAGNOSIS — R509 Fever, unspecified: Secondary | ICD-10-CM

## 2014-09-03 DIAGNOSIS — R1310 Dysphagia, unspecified: Secondary | ICD-10-CM

## 2014-09-03 DIAGNOSIS — R5081 Fever presenting with conditions classified elsewhere: Secondary | ICD-10-CM | POA: Insufficient documentation

## 2014-09-03 LAB — CBC
HCT: 30 % — ABNORMAL LOW (ref 36.0–46.0)
Hemoglobin: 9.6 g/dL — ABNORMAL LOW (ref 12.0–15.0)
MCH: 31.1 pg (ref 26.0–34.0)
MCHC: 32 g/dL (ref 30.0–36.0)
MCV: 97.1 fL (ref 78.0–100.0)
PLATELETS: 126 10*3/uL — AB (ref 150–400)
RBC: 3.09 MIL/uL — ABNORMAL LOW (ref 3.87–5.11)
RDW: 22 % — ABNORMAL HIGH (ref 11.5–15.5)
WBC: 8 10*3/uL (ref 4.0–10.5)

## 2014-09-03 LAB — COMPREHENSIVE METABOLIC PANEL
ALBUMIN: 2 g/dL — AB (ref 3.5–5.2)
ALT: 11 U/L (ref 0–35)
AST: 15 U/L (ref 0–37)
Alkaline Phosphatase: 92 U/L (ref 39–117)
Anion gap: 11 (ref 5–15)
BUN: 21 mg/dL (ref 6–23)
CALCIUM: 8.2 mg/dL — AB (ref 8.4–10.5)
CO2: 26 mEq/L (ref 19–32)
CREATININE: 0.47 mg/dL — AB (ref 0.50–1.10)
Chloride: 100 mEq/L (ref 96–112)
GFR calc Af Amer: >90 mL/min (ref >90)
GFR, EST NON AFRICAN AMERICAN: 86 mL/min — AB (ref >90)
Glucose, Bld: 151 mg/dL — ABNORMAL HIGH (ref 70–99)
Potassium: 4.2 mEq/L (ref 3.7–5.3)
Sodium: 137 mEq/L (ref 137–147)
Total Bilirubin: 0.6 mg/dL (ref 0.3–1.2)
Total Protein: 6 g/dL (ref 6.0–8.3)

## 2014-09-03 LAB — GLUCOSE, CAPILLARY
GLUCOSE-CAPILLARY: 142 mg/dL — AB (ref 70–99)
GLUCOSE-CAPILLARY: 76 mg/dL (ref 70–99)
GLUCOSE-CAPILLARY: 96 mg/dL (ref 70–99)
Glucose-Capillary: 95 mg/dL (ref 70–99)
Glucose-Capillary: 140 mg/dL — ABNORMAL HIGH (ref 70–99)

## 2014-09-03 LAB — APTT
APTT: 63 s — AB (ref 24–37)
aPTT: 36 seconds (ref 24–37)

## 2014-09-03 LAB — PROTIME-INR
INR: 1.08 (ref 0.00–1.49)
PROTHROMBIN TIME: 14.1 s (ref 11.6–15.2)

## 2014-09-03 MED ORDER — LOPERAMIDE HCL 1 MG/5ML PO LIQD
2.0000 mg | Freq: Two times a day (BID) | ORAL | Status: DC
  Administered 2014-09-03 – 2014-09-05 (×6): 2 mg
  Filled 2014-09-03 (×8): qty 10

## 2014-09-03 MED ORDER — METHYLPREDNISOLONE SODIUM SUCC 40 MG IJ SOLR
40.0000 mg | Freq: Two times a day (BID) | INTRAMUSCULAR | Status: DC
  Administered 2014-09-03 – 2014-09-05 (×4): 40 mg via INTRAVENOUS
  Filled 2014-09-03 (×7): qty 1

## 2014-09-03 MED ORDER — ARGATROBAN 50 MG/50ML IV SOLN
64.0000 ug/min | INTRAVENOUS | Status: DC
  Administered 2014-09-03 – 2014-09-04 (×2): 64 ug/min via INTRAVENOUS
  Filled 2014-09-03 (×2): qty 50

## 2014-09-03 MED ORDER — ENOXAPARIN SODIUM 30 MG/0.3ML ~~LOC~~ SOLN
30.0000 mg | SUBCUTANEOUS | Status: DC
  Filled 2014-09-03: qty 0.3

## 2014-09-03 NOTE — Progress Notes (Addendum)
Clinical Social Work Department CLINICAL SOCIAL WORK PLACEMENT NOTE 09/03/2014  Patient:  [name], [name]  Account Number:  [id number] Admit date:  08/24/2014  Clinical Social Worker:  Maryln Manuel  Date/time:  09/03/2014 01:30 PM  Clinical Social Work is seeking post-discharge placement for this patient at the following level of care:   SKILLED NURSING   (*CSW will update this form in Epic as items are completed)   09/03/2014  Patient/family provided with [place] Department of Clinical Social Work's list of facilities offering this level of care within the geographic area requested by the patient (or if unable, by the patient's family).  09/03/2014  Patient/family informed of their freedom to choose among providers that offer the needed level of care, that participate in Medicare, Medicaid or managed care program needed by the patient, have an available bed and are willing to accept the patient.  09/03/2014  Patient/family informed of [facility]' ownership interest in [facility], as well as of the fact that they are under no obligation to receive care at this facility.  PASARR submitted to EDS on 09/03/2014 PASARR number received on 09/03/2014  FL2 transmitted to all facilities in geographic area requested by pt/family on  09/03/2014 FL2 transmitted to all facilities within larger geographic area on   Patient informed that his/her managed care company has contracts with or will negotiate with  certain facilities, including the following:     Patient/family informed of bed offers received:  09/03/2014 Patient chooses bed at Pt and pt family choose for pt to return home with home health services.  Physician recommends and patient chooses bed at    Patient to be transferred to  on   Patient to be transferred to facility by  Patient and family notified of transfer on  Name of family member notified:    The following physician request were entered in  Epic:   Additional Comments:   Alison Murray, MSW, [place] [facility] [phone]

## 2014-09-03 NOTE — Progress Notes (Signed)
CSW continuing to follow.  CSW followed up with pt and pt two daughters at bedside. CSW provided SNF bed offers that are available at this time. Pt daughter, [name] stated that they felt that things were going well at home, but will review options. Pt daughter agreeable to CSW following up tomorrow and RNCM coming to visit as well to discuss decision regarding discharge.  CSW to continue to follow to provide support and assist with pt discharge planning needs.   Alison Murray, MSW, [place] [employer] [phone]

## 2014-09-03 NOTE — Progress Notes (Signed)
- Name: Martha Mcbride MRN: [id number] DOB: 07/28/1925    ADMISSION DATE:  08/24/2014  REFERRING MD :  EDP  CHIEF COMPLAINT:  Generalized weakness, fever  BRIEF PATIENT DESCRIPTION: 80 year old with interstitial lung disease, presumed IPF, polymyalgia on chronic prednisone, chronic diastolic heart failure and severe dysphagia status post PEG presents with her third episode of HCAP in the last 6 weeks, with severe sepsis, lactate of 6.  She also had a new malar rash on admission.   SIGNIFICANT EVENTS  11/28  Frequent diarrhea, CDiff neg, TF held  12/02  Moved to floor 12/03  Ongoing loose stools, imodium helping 12/04  Diarrhea improved, cxr unchanged.  Temp 100.7 rectal  STUDIES:  07/30/14 CT Chest >> Evidence of emphysema with traction bronchiectasis in multiple areas of interstitial fibrosis. Appearance is consistent with underlying usual interstitial pneumonitis. There are areas of patchy consolidation in both lower lobes as wellas to a lesser extent in the upper lobes. There also moderate pleural effusions. The areas of consolidation are felt to most likely represent superimposed pneumonia. There may be a degree of superimposed congestive heart failure as well given the pleural effusions .5 mm nodular opacity in the posterior segment of the right upper lobe. Areas of atherosclerotic change and cardiac valvular calcification. Areas of relatively mild adenopathy. Cholelithiasis. 11/30 DG UGI with KUB >> well positioned g-tube, no extravasation of contrast media, opacification of moderate sized hiatal hernia with reflux to the level of the thoracic inlet while supine, no aspiration observed, incidental finding of duodenal diverticula    Historical Data: 04/2012 CT abd >>large hiatal hernia . Prominent interstitial lung markings suggest chronic changes  06/2014 TEE mild MS, mod AS, nml LVfn 07/24/14  Admit for HCAP, encephalopathy, & dHF exacerbation.  07/28/14 Admit with fever 103,  confusion,productive cough.  WBC 12, Lactic acid at 3.5. Chest x ray with diffuse bilateral pulmonary interstitial infiltrates & eosinophilia (AEC 1800 on 10/22 which came down to 600 on 10/31). Diarrhea is resolved & felt unlikely to be parasitic by ID - stool O & P neg. 07/29/14  Ova and parasite stool neg. Strongyloides antibody neg, Quantiferon Tb neg    SUBJECTIVE:   On intermittent  tube feeds but having loose stools Febrile 101 3  No increased sob   VITAL SIGNS: Temp:  [97.6 F (36.4 C)-101 F (38.3 C)] 99.2 F (37.3 C) (12/07 0459) Pulse Rate:  [95-108] 108 (12/07 0459) Resp:  [18-24] 24 (12/07 0459) BP: (108-123)/(55-59) 118/55 mmHg (12/07 0459) SpO2:  [92 %-99 %] 92 % (12/07 0459) Weight:  [64.1 kg (141 lb 5 oz)] 64.1 kg (141 lb 5 oz) (12/07 0459)  PHYSICAL EXAMINATION: Gen. Chronically ill, elderly woman in no distress ENT - nose without purulence, no LN or TMG Lungs: bibasilar crackles, decreased depth inspiration, no wheezing   Cardiovascular: rrr, 3/6 blowing systolic murmur Abdomen: soft and non-tender, BS normal. LE with mild edema, no cyanosis, abnormal toes. Neuro:  alert, non focal    Recent Labs Lab 08/30/14 0535 08/31/14 0527 09/01/14 0512  NA 139 139 138  K 3.8 4.1 4.5  CL 105 105 104  CO2 21 22 22   BUN 11 15 16   CREATININE 0.47* 0.49* 0.47*  GLUCOSE 91 102* 86    Recent Labs Lab 08/30/14 0535 08/31/14 0527 09/01/14 0512  HGB 10.2* 10.1* 10.0*  HCT 30.8* 30.6* 30.8*  WBC 7.3 7.0 6.4  PLT 116* 132* 131*     ASSESSMENT / PLAN:  Healthcare associated pneumonia -  recurrent, likely aspiration.   Suspected chronic aspiration Suspected superimposed cardiogenic pulm edema Underlying interstitial lung disease, presumed IPF  New malar rash -   Resolved - ANA 1: 40 pos  Sore throat - prior to admission  Chest xray yest showed improved edema, and only small effusions.   Plan: Off antibiotic    CHF - without  decompensation  Plan: Continue home Metoprolol  Continue home dose lasix 20 mg QD  Dysphagia - keep nothing by mouth for now.  Last SLP evaluation 11/3 notable for aspiration of nectar thick liquids, aspiration of secretions with barium without awareness.  11/30 DG UGI with aspiration to level of thoracic inlet without overt aspiration but further supports concerns of chronic aspiration.   Hiatal Hernia - likely still aspirating, in spite of her PEG due to hiatal hernia, consider further evaluation.  Last SLP notes reflected aspiration of barium without awareness.  -Will ask IR to assess for PEJ but feel limited benefit  Diarrhea - likely due to combo abx, TF's.  Resolved.   Plan: Scheduled & PRN imodium . Nutrition to added fiber to TF formula Florastor   Protein Calorie Malnutrition GERD  Plan: TF, bolus feedings to mimic home feeding.    Protein (will reduce overall volume), fiber supplement Not  a candidate for surgical repair of hernia See speech d/c recs in notes.  Polymyalgia   Plan:  Continue 4.5 mg QD pred, was being slowly tapered, last dose noted to be 4.5 mg QD per med rec   Sacral Decubitus, Stage III - had prior to admission.   Dry Gangrene of R Toes   Plan: WOC following Monitor toes, no acute interventions.  Previously seen by [facility].   Deconditioning  Dementia - mild  Plan: PT Consult Mobilize, out of bed BID, pt has been reluctant due to sacral ulcer. CIR consult/ if nto approved planf or SNF rehab  I spoke to daughter at bedside - who is appreciative of care rendered so far. Starting conversation about setting realistic goals Persistent fevers remain a concern but seem to be related to ongoing aspiration  Rigoberto Noel. MD

## 2014-09-03 NOTE — Progress Notes (Addendum)
ANTICOAGULATION CONSULT NOTE - Initial Consult  Pharmacy Consult for Argatroban Indication: r/o HIT  Allergies  Allergen Reactions  . Doxycycline Nausea And Vomiting  . Other Nausea And Vomiting and Other (See Comments)    Anesthesia makes her very sleepy and makes it hard for her to come out of it.   Marland Kitchen Penicillins Hives and Nausea And Vomiting  . Sulfa Antibiotics Nausea And Vomiting    Patient Measurements: Height: 5\' 3"  (160 cm) Weight: 141 lb 5 oz (64.1 kg) IBW/kg (Calculated) : 52.4  Vital Signs: Temp: 98.3 F (36.8 C) (12/07 1245) Temp Source: Oral (12/07 1245) BP: 103/61 mmHg (12/07 1245) Pulse Rate: 90 (12/07 1245)  Labs:  Recent Labs  09/01/14 0512  HGB 10.0*  HCT 30.8*  PLT 131*  CREATININE 0.47*    Estimated Creatinine Clearance: 44.7 mL/min (by C-G formula based on Cr of 0.47).   Medical History: Past Medical History  Diagnosis Date  . Arthritis   . Polymyalgia   . Acid reflux   . Immune deficiency disorder     autoimmune disorder - polymyalgia - on prednisone  . Breast cancer     Treated with lumpectomy and radiation  . Giardia   . Pinworms   . History of hiatal hernia   . Pneumonia   . Dementia   . CHF (congestive heart failure)   . Dysrhythmia     Medications:  Scheduled:  . antiseptic oral rinse  7 mL Mouth Rinse q12n4p  . aspirin  81 mg Per Tube Daily  . chlorhexidine  15 mL Mouth Rinse BID  . feeding supplement (JEVITY 1.2 CAL)  200 mL Per Tube 5 X Daily  . feeding supplement (PRO-STAT SUGAR FREE 64)  30 mL Per Tube TID WC  . free water  120 mL Per Tube QID  . furosemide  20 mg Oral Daily  . insulin aspart  2-6 Units Subcutaneous 5 times per day  . loperamide  2 mg Per Tube BID  . methylPREDNISolone (SOLU-MEDROL) injection  40 mg Intravenous Q12H  . metoprolol tartrate  6.25 mg Per Tube BID  . polycarbophil  625 mg Per Tube Daily  . potassium chloride  40 mEq Per Tube Daily  . ranitidine  150 mg Per Tube QHS  . saccharomyces  boulardii  250 mg Oral BID   Infusions:    Assessment: 70 yoF admitted on 11/27 from urgent care center for recurrent HCAP.  On 12/7 the RN noted the all fingers of left hand and 3 fingers of right hand turning blue and cold to touch.  Concern for possible vasculitis vs HIT.  Pharmacy is consulted to dose Argatroban.   Inpatient Anticoagulation:  Heparin SQ (11/27 - 12/7, last given at 0627), Lovenox (NOT started, due to begin 12/8)  SCr 0.47 (last on 12/5) and AST/ALT 25/18 ( last on 11/27)  CBC: Hgb 10, Plt 131 (last on 12/5)  Baseline Plt 165 at admission 08/24/14.  HIT panel ordered, pending collection   Goal of Therapy:  aPTT 50-90 seconds Monitor platelets by anticoagulation protocol: Yes   Plan:   Baseline labs:  APTT, INR, CBC, CMET  Argatroban IV infusion 1 mcg/kg/min = 64 mcg/min (3.8 ml/hr)  Check APTT 2 hours after starting and q2 hours until two therapeutic levels are obtained.   Daily APTT & CBC while on argatroban.  Gretta Arab PharmD, BCPS Pager [phone] 09/03/2014 7:07 PM     Addendum Baseline labs prior to Argatroban infusion: Plt 126 SCr  0.47 AST/ALT 15/11 INR 1.08 APTT 36  Plan: Start Argatroban IV infusion 1 mcg/kg/min = 64 mcg/min (3.8 ml/hr) Check APTT 2 hours after starting  Gretta Arab PharmD, BCPS Pager [phone] 09/03/2014 8:57 PM

## 2014-09-03 NOTE — Progress Notes (Signed)
RN rounding on pt observed all fingers of left hand and three fingers of right hand turned blue, both hands cold to touch. Pt able to move fingers when asked, does not have any pain or discomfort. VS stable. MD paged. Will continue to monitor.

## 2014-09-03 NOTE — Progress Notes (Signed)
Physical Therapy Treatment Patient Details Name: Martha Mcbride MRN: [id number] DOB: 1925/07/03 Today's Date: 09/03/2014    History of Present Illness 80 year old with interstitial lung disease, presumed IPF, polymyalgia on chronic prednisone, chronic diastolic heart failure and severe dysphagia status post PEG presents with her third episode of HCAP in the last 6 weeks, with severe sepsis, . Patient recently in hospital.    PT Comments    Pt progressing slowly with issues of cont loose stools and increased weakness with increased length of stay. Very limited activity tolerance and requires freq rest breaks.   Daughter Martha Mcbride was present during session and wishes to follow and respect her mother's wishes to go home however had a long discussion with her about her mothers physical decline and great need for ST Rehab at [facility].  Martha Mcbride wishes to discuss further with her sister and mother (pt).  Follow Up Recommendations  SNF       Equipment Recommendations  None recommended by PT    Recommendations for Other Services       Precautions / Restrictions Precautions Precautions: Fall Precaution Comments: NPO,PEG  needs  air cushions in chair. Restrictions Weight Bearing Restrictions: No    Mobility  Bed Mobility Overal bed mobility: Needs Assistance Bed Mobility: Supine to Sit     Supine to sit: Mod assist Sit to supine: HOB elevated   General bed mobility comments: A for LEs and manual forward scoot with bedpad  Transfers Overall transfer level: Needs assistance Equipment used: Rolling walker (2 wheeled) Transfers: Sit to/from Stand Sit to Stand: Min guard         General transfer comment: performed x3, A to BSC; tolerated 3 minutes of standing balance for A with hygiene but became very shaky toward the end of 3 minutes; increased time to rise and stabilize  Ambulation/Gait Ambulation/Gait assistance: Min assist Ambulation Distance (Feet): 30 Feet (10 ft)   Gait  Pattern/deviations: Step-to pattern;Decreased stride length;Shuffle;Trunk flexed Gait velocity: decr   General Gait Details: VCs for hand placement and min tactile cues for RW management; pt fatigues easily after 30 feet of gait and required one sitting rest break, then attempted gait back to room but pt too weak after 10 feet; LOB, shaky, unsteady; HIGH fall risk   Stairs            Wheelchair Mobility    Modified Rankin (Stroke Patients Only)       Balance                                    Cognition Arousal/Alertness: Awake/alert Behavior During Therapy: WFL for tasks assessed/performed Overall Cognitive Status: Within Functional Limits for tasks assessed                      Exercises      General Comments        Pertinent Vitals/Pain Pain Assessment: No/denies pain    Home Living                      Prior Function            PT Goals (current goals can now be found in the care plan section) Progress towards PT goals: Progressing toward goals    Frequency  Min 3X/week    PT Plan Current plan remains appropriate    Co-evaluation  End of Session Equipment Utilized During Treatment: Gait belt Activity Tolerance: Patient limited by fatigue Patient left: in chair;with call bell/phone within reach;with family/visitor present     Time: 1126-1208 PT Time Calculation (min) (ACUTE ONLY): 42 min  Charges:  $Gait Training: 23-37 mins $Therapeutic Activity: 8-22 mins                    G Codes:      Rica Koyanagi  PTA [facility]  Acute  Rehab Pager      [phone]

## 2014-09-03 NOTE — Progress Notes (Signed)
Clinical Social Work Department BRIEF PSYCHOSOCIAL ASSESSMENT 09/03/2014  Patient:  Martha Mcbride, Martha Mcbride     Account Number:  [id number]     Admit date:  08/24/2014  Clinical Social Worker:  Maryln Manuel  Date/Time:  09/03/2014 12:30 PM  Referred by:  Physician  Date Referred:  09/03/2014 Referred for  SNF Placement   Other Referral:   Interview type:  Family Other interview type:    PSYCHOSOCIAL DATA Living Status:  FAMILY Admitted from facility:   Level of care:   Primary support name:  [name] Davis/daughter/[phone] Primary support relationship to patient:  CHILD, ADULT Degree of support available:   strong    CURRENT CONCERNS Current Concerns  Post-Acute Placement   Other Concerns:    SOCIAL WORK ASSESSMENT / PLAN CSW received referral for New SNF for rehab.    CSW met with pt daughter, [name] in the hallway and pt daughter, [name] was on the telephone and placed on speaker phone. CSW introduced self and explained role. Pt daughter, [name] was present for PT treatment today and discussed with pt other daughter that PT recommended rehab at [facility]. CSW clarified pt daughters questions and concerns. Pt daughters shared that they have not yet made final decision regarding SNF for rehab versus pt returning home with pt daughters. CSW discussed with pt daughters if they would be agreeable to CSW initiating SNF search in order to have options that may assist in making a decision regarding discharge plan. Pt daughters agreeable to initiation of SNF search to have options available in order to make decision regarding SNF. Pt daughters stated that they want pt to have a private room and they want the option to be able to stay at SNF around the clock with pt. CSW discussed that facilities vary regarding if they allow visitors to stay around the clock, but CSW will include information in SNF search. Pt daughter, [name] stated that SNF would not be an option if pt family cannot stay around the clock. CSW  expressed understanding and will include information in SNF search in order to notify family if facilities will allow family to stay around the clock.    CSW completed FL2 and initiated SNF search to [facility]    CSW to follow up with pt and pt daughters regarding SNF bed offers.    CSW to continue to follow to provide support and assist with pt disposition needs.   Assessment/plan status:  Psychosocial Support/Ongoing Assessment of Needs Other assessment/ plan:   discharge planning   Information/referral to community resources:   [facility] list    PATIENT'S/FAMILY'S RESPONSE TO PLAN OF CARE: Pt alert and oriented x 4. Pt resting at this time, but had indicated to pt family that she would be agreeable to explore rehab options. Pt daughters are supportive and actively involved in pt care and although have not yet made decision regarding rehab at SNF; they are agreeable to CSW initiating SNF search in order to review options to make decision.    Alison Murray, MSW, [place] Work [phone]

## 2014-09-03 NOTE — Progress Notes (Signed)
Referring Physician(s): Dr. Elsworth Soho, Pulmonary/Critical Care   Subjective: 80 yo female with hx of interstitial lung disease, presumed IPF, polymyalgia on chronic prednisone, chronic diastolic heart failure and severe dysphagia. She underwent successful perc G-tube placement on 11/9 and has been using this without issue for the past month. The daughter reports that she has minimal to no residuals during/after feeds. They also keep her sitting upright in a chair or the bed for a minimum of 1 hour after TF. She has been admitted with another bout of HCAP and there is concern she is aspirating. UGI via gastrostomy was performed on 11/30 demonstrating her known large hiatal hernia and some reflux to the level of the thoracic inlet with the pt supine, but no aspiration was noted. Ir is asked to consider converting the gastrostomy to a GJ tube to possibly reduce the risk of reflux/aspiration. Chart, PMHx, meds, imaging reviewed.  Allergies: Doxycycline; Other; Penicillins; and Sulfa antibiotics  Medications: Current facility-administered medications: 0.9 %  sodium chloride infusion, 250 mL, Intravenous, PRN, Kara Mead V, MD;  acetaminophen (TYLENOL) tablet 650 mg, 650 mg, Oral, Q4H PRN, Kara Mead V, MD, 650 mg at 09/03/14 0806;  antiseptic oral rinse (CPC / CETYLPYRIDINIUM CHLORIDE 0.05%) solution 7 mL, 7 mL, Mouth Rinse, q12n4p, Kara Mead V, MD, 7 mL at 09/03/14 1600 aspirin chewable tablet 81 mg, 81 mg, Per Tube, Daily, Kara Mead V, MD, 81 mg at 09/03/14 0806;  chlorhexidine (PERIDEX) 0.12 % solution 15 mL, 15 mL, Mouth Rinse, BID, Kara Mead V, MD, 15 mL at 09/03/14 0805;  [START ON 09/04/2014] enoxaparin (LOVENOX) injection 30 mg, 30 mg, Subcutaneous, Q24H, Kara Mead V, MD;  feeding supplement (JEVITY 1.2 CAL) liquid 200 mL, 200 mL, Per Tube, 5 X Daily, Kara Mead V, MD, 200 mL at 09/03/14 1400 feeding supplement (PRO-STAT SUGAR FREE 64) liquid 30 mL, 30 mL, Per Tube, TID WC, Hazle Coca, RD, 30 mL at 09/03/14 1214;  free water 120 mL, 120 mL, Per Tube, QID, Kara Mead V, MD, 120 mL at 09/03/14 1400;  furosemide (LASIX) tablet 20 mg, 20 mg, Oral, Daily, Donita Brooks, NP, 20 mg at 09/03/14 0806;  insulin aspart (novoLOG) injection 2-6 Units, 2-6 Units, Subcutaneous, 5 times per day, Clearence Ped, RN, 2 Units at 09/03/14 1419 loperamide (IMODIUM) 1 MG/5ML solution 2 mg, 2 mg, Oral, PRN, Donita Brooks, NP, 2 mg at 09/03/14 0805;  loperamide (IMODIUM) 1 MG/5ML solution 2 mg, 2 mg, Per Tube, BID, Kara Mead V, MD, 2 mg at 09/03/14 1213;  metoprolol tartrate (LOPRESSOR) 25 mg/10 mL oral suspension 6.25 mg, 6.25 mg, Per Tube, BID, Donita Brooks, NP, 6.25 mg at 09/03/14 0805 ondansetron (ZOFRAN) injection 4 mg, 4 mg, Intravenous, Q6H PRN, Kara Mead V, MD, 4 mg at 09/03/14 0818;  polycarbophil (FIBERCON) tablet 625 mg, 625 mg, Per Tube, Daily, Hazle Coca, RD, 625 mg at 09/03/14 0806;  potassium chloride 20 MEQ/15ML (10%) solution 40 mEq, 40 mEq, Per Tube, Daily, Donita Brooks, NP, 40 mEq at 09/03/14 0805 prednisoLONE (PRELONE) 15 MG/5ML SOLN 4.5 mg, 4.5 mg, Per Tube, QAC breakfast, Collene Gobble, MD, 4.5 mg at 09/03/14 0805;  ranitidine (ZANTAC) 150 MG/10ML syrup 150 mg, 150 mg, Per Tube, QHS, Kara Mead V, MD, 150 mg at 09/02/14 2230;  saccharomyces boulardii (FLORASTOR) capsule 250 mg, 250 mg, Oral, BID, Brand Males, MD, 250 mg at 09/03/14 0805   Review of Systems  Vital Signs: BP 103/61 mmHg  Pulse 90  Temp(Src) 98.3 F (36.8 C) (Oral)  Resp 20  Ht 5\' 3"  (1.6 m)  Wt 141 lb 5 oz (64.1 kg)  BMI 25.04 kg/m2  SpO2 100%  Physical Exam Abd: soft, ND, NT. G-tube intact, site clean, no signs of infection or leak.  Imaging: Dg Chest Port 1 View  09/01/2014   CLINICAL DATA:  Aspiration pneumonia  EXAM: PORTABLE CHEST - 1 VIEW  COMPARISON:  Yesterday  FINDINGS: Pulmonary edema improved. Upper normal heart size. No pneumothorax. Small pleural effusions  suspected.  IMPRESSION: Improved pulmonary edema.   Electronically Signed   By: Maryclare Bean M.D.   On: 09/01/2014 08:20   Dg Chest Port 1 View  08/31/2014   CLINICAL DATA:  Sepsis.  Pneumonia.  EXAM: PORTABLE CHEST - 1 VIEW  COMPARISON:  09/19/2014.  FINDINGS: Mediastinum unremarkable. Cardiomegaly with pulmonary vascular prominence. Diffuse increase pulmonary interstitial markings are noted bilaterally. congestive heart failure could present this fashion. Persistent right upper lobe infiltrate. Very low lung volumes with basilar atelectasis. Underlying chronic interstitial lung disease. No pleural effusion or pneumothorax. No acute osseus abnormality.  IMPRESSION: 1. Cardiomegaly with interim appearance of pulmonary venous congestion and diffuse interstitial prominence suggesting congestive heart failure. 2. Persistent right upper lobe infiltrate. 3. Very low lung volumes with prominent basilar atelectasis. Chronic interstitial lung disease.   Electronically Signed   By: Marcello Moores  Register   On: 08/31/2014 07:21    Labs:  CBC:  Recent Labs  08/26/14 0419 08/30/14 0535 08/31/14 0527 09/01/14 0512  WBC 8.0 7.3 7.0 6.4  HGB 10.6* 10.2* 10.1* 10.0*  HCT 33.0* 30.8* 30.6* 30.8*  PLT 124* 116* 132* 131*    COAGS:  Recent Labs  07/31/14 0340  INR 1.09    BMP:  Recent Labs  08/29/14 1630 08/30/14 0535 08/31/14 0527 09/01/14 0512  NA 138 139 139 138  K 3.4* 3.8 4.1 4.5  CL 106 105 105 104  CO2 19 21 22 22   GLUCOSE 176* 91 102* 86  BUN 12 11 15 16   CALCIUM 7.4* 7.5* 7.8* 7.9*  CREATININE 0.48* 0.47* 0.49* 0.47*  GFRNONAA 86* 86* 85* 86*  GFRAA >90 >90 >90 >90    LIVER FUNCTION TESTS:  Recent Labs  07/19/14 2039 07/28/14 1225 08/24/14 1536  BILITOT 0.7 0.8 0.8  AST 13 18 25   ALT 15 24 18   ALKPHOS 100 119* 109  PROT 5.9* 6.5 6.8  ALBUMIN 2.7* 2.8* 2.2*    Assessment and Plan: Dysphagia s/p perc G-tube 11/9 Recurrent HCAP, possibly secondary to  reflux/aspiration Given minimal/zero residuals and UGI findings, it would be difficult to say with certainty that she is refluxing TF enough where she is aspirating. I discussed the option of converting the G to a GJ. At this time, she is about 4 weeks post G-tube placement. We would like to wait until about 6 weeks before a full exchange to a dual-lumen G-J tube. This would allow jejunal TF as well as the G-port for meds and decompression if needed. In the interim, a small coaxial tube could be placed through the existing G-tube down into the jejunum to offer jejunal feeds. The G-tube could still be used for meds though any non-liquid meds would have to crushed completely to powder so as to prevent clogging. Associated risks/complications of TF via jejunal tubes were discussed, including clogging/need for unclogging or exchange, possibility of the jejunal limb refluxing back into the stomach, which would require repositioning. Additionally, the pt has been  dealing with significant diarrhea, SB feeds may worsen that, though it may not have any effect as the feeds would only be in the prox jejunum. The daughter is going to further discuss with other family members. IR team will follow peripherally and keep communication with primary service.    SignedAscencion Dike 09/03/2014, 3:31 PM

## 2014-09-03 NOTE — Progress Notes (Signed)
Physical medicine and rehabilitation consult requested with chart reviewed. Spoke at length with patient as well as family at bedside with physical therapy also at bedside. Patient with numerous admissions over the past weeks with debilitation as well as sacral skin sores. She has not been able to tolerate therapies and fatigues easily with limited endurance needing encouragement to participate at times. After discussing at length with family felt skilled nursing facility was most beneficial as she would not be able to tolerate the intensity of inpatient rehabilitation services. Hold on formal rehabilitation consult at this time with recommendations of skilled nursing facility.

## 2014-09-03 NOTE — Progress Notes (Signed)
[facility] for Infectious Disease  Total days of antibiotics 9        Day 0 - finished abtx on 12/5               Reason for Consult: intermittent fever    Referring Physician: alva  Active Problems:   HCAP (healthcare-associated pneumonia)    HPI: Martha Mcbride is a 80 y.o. female 80 year old with interstitial lung disease, presumed IPF, PMR on chronic prednisone, chronic diastolic heart failure and severe dysphagia status post PEG presents with her third episode of HCAP in the last 6 weeks, with severe sepsis, lactate of 6. She was treated for HCAP in mid-late October with vancomycin and cefepime and then re-admitted in late Oct/early November again treated for HCAP but at this time found to have severe dysphagia where PEG was placed at this last admission.Marland Kitchen She was admitted on 11/27 for worsening shortness of breath today and hypoxia as noted by a home pulse oximeter. Per daughter patient has been having problem of low oxygen level at night during sleep. She was trying to arrange home oxygen trough patient PCP but was unsuccessful.  Patient was found to be febrile, with tempeture at 103.WBC at 12, Lactic acid at 6. Chest x ray with No significant change in cardiomegaly and diffuse bilateral pulmonary interstitial infiltrates. She responded to fluids, she was initially started on broad spectrum antibiotics then changed to levofloxacin which she took for a total of 9 days. She has had isolated daily fevers for the last 3 days ranging from 100.2 to 101. No change on leukocytosis, thought to be due to pneumonitis from chronic aspiration. Patient has been off of antibiotics since 09/01/2014.she continues to have chronic diarrhea, in part due to tube feeds, previous diarrheal infectious work up is negative. Patient is surrounded by her 2 daughters caring for her. Patient is hard of hearing, fatigued from working with physical therapy. Her daughters report that they think her diarrhea is related  to being off schedule from her tube feedings, which they noticed at home as well.  Past Medical History  Diagnosis Date  . Arthritis   . Polymyalgia   . Acid reflux   . Immune deficiency disorder     autoimmune disorder - polymyalgia - on prednisone  . Breast cancer     Treated with lumpectomy and radiation  . Giardia   . Pinworms   . History of hiatal hernia   . Pneumonia   . Dementia   . CHF (congestive heart failure)   . Dysrhythmia     Allergies:  Allergies  Allergen Reactions  . Doxycycline Nausea And Vomiting  . Other Nausea And Vomiting and Other (See Comments)    Anesthesia makes her very sleepy and makes it hard for her to come out of it.   Marland Kitchen Penicillins Hives and Nausea And Vomiting  . Sulfa Antibiotics Nausea And Vomiting    MEDICATIONS: . antiseptic oral rinse  7 mL Mouth Rinse q12n4p  . aspirin  81 mg Per Tube Daily  . chlorhexidine  15 mL Mouth Rinse BID  . [START ON 09/04/2014] enoxaparin (LOVENOX) injection  30 mg Subcutaneous Q24H  . feeding supplement (JEVITY 1.2 CAL)  200 mL Per Tube 5 X Daily  . feeding supplement (PRO-STAT SUGAR FREE 64)  30 mL Per Tube TID WC  . free water  120 mL Per Tube QID  . furosemide  20 mg Oral Daily  . insulin aspart  2-6  Units Subcutaneous 5 times per day  . loperamide  2 mg Per Tube BID  . metoprolol tartrate  6.25 mg Per Tube BID  . polycarbophil  625 mg Per Tube Daily  . potassium chloride  40 mEq Per Tube Daily  . prednisoLONE  4.5 mg Per Tube QAC breakfast  . ranitidine  150 mg Per Tube QHS  . saccharomyces boulardii  250 mg Oral BID    History  Substance Use Topics  . Smoking status: Never Smoker   . Smokeless tobacco: Not on file  . Alcohol Use: No    Family History  Problem Relation Age of Onset  . Stroke Mother     Review of Systems  Constitutional: Negative for fever, chills, diaphoresis, but fatigued from working with PT. She denies shortness of breath, but she is wearing nasal cannula. Doesn't  finish answering ROS due to gong back to sleep  OBJECTIVE: Temp:  [98.3 F (36.8 C)-101 F (38.3 C)] 98.3 F (36.8 C) (12/07 1245) Pulse Rate:  [90-108] 90 (12/07 1245) Resp:  [20-24] 20 (12/07 1245) BP: (103-123)/(55-61) 103/61 mmHg (12/07 1245) SpO2:  [92 %-100 %] 100 % (12/07 1245) Weight:  [141 lb 5 oz (64.1 kg)] 141 lb 5 oz (64.1 kg) (12/07 0459) Physical Exam  Constitutional:  oriented to person, place. Appears her stated age. appears chronically ill No distress.  HENT:  Mouth/Throat:  No oropharyngeal exudate. Dry oral pharynx Cardiovascular: Normal rate, regular rhythm and 3/6 SEM. Exam reveals no gallop and no friction rub.  Pulmonary/Chest: Effort normal and breath sounds normal. No respiratory distress.  has no wheezes. Crackles at bases Abdominal: Soft. Bowel sounds are normal.  exhibits no distension. There is no tenderness.  Lymphadenopathy: no cervical adenopathy.  Neurological: alert and oriented to person, place, and time.  Skin: erythema/cyanotic appearing to finger tips of left hand. Right hand 2nd digit slightly edematous and erythema. Small serous blister flexor surface.  Psychiatric: a normal mood and affect.  behavior is normal when she answers questions LABS: Results for orders placed or performed during the hospital encounter of 08/24/14 (from the past 48 hour(s))  Glucose, capillary     Status: None   Collection Time: 09/01/14  5:47 PM  Result Value Ref Range   Glucose-Capillary 96 70 - 99 mg/dL  Glucose, capillary     Status: None   Collection Time: 09/01/14  9:55 PM  Result Value Ref Range   Glucose-Capillary 81 70 - 99 mg/dL   Comment 1 Notify RN   Glucose, capillary     Status: Abnormal   Collection Time: 09/02/14  6:25 AM  Result Value Ref Range   Glucose-Capillary 105 (H) 70 - 99 mg/dL   Comment 1 Notify RN   Glucose, capillary     Status: Abnormal   Collection Time: 09/02/14 10:07 AM  Result Value Ref Range   Glucose-Capillary 126 (H) 70 - 99  mg/dL  Glucose, capillary     Status: Abnormal   Collection Time: 09/02/14  2:23 PM  Result Value Ref Range   Glucose-Capillary 116 (H) 70 - 99 mg/dL  Glucose, capillary     Status: Abnormal   Collection Time: 09/02/14  5:33 PM  Result Value Ref Range   Glucose-Capillary 105 (H) 70 - 99 mg/dL  Glucose, capillary     Status: None   Collection Time: 09/02/14  9:44 PM  Result Value Ref Range   Glucose-Capillary 95 70 - 99 mg/dL   Comment 1 Notify RN  Glucose, capillary     Status: None   Collection Time: 09/03/14  6:31 AM  Result Value Ref Range   Glucose-Capillary 95 70 - 99 mg/dL  Glucose, capillary     Status: Abnormal   Collection Time: 09/03/14 10:01 AM  Result Value Ref Range   Glucose-Capillary 140 (H) 70 - 99 mg/dL  Glucose, capillary     Status: Abnormal   Collection Time: 09/03/14  2:05 PM  Result Value Ref Range   Glucose-Capillary 142 (H) 70 - 99 mg/dL    MICRO: 11/27 blood cx ngtd 11/27 urine cx ngtd 11/28 throat cx ngtd 11/28 cdiff negative  IMAGING: 12/5 cxr improved pulmonary edema   Assessment/Plan:  80yo F with severe dysphagia s/p PEG, pulmonary fibrosis with ILD, who presented with sepsis thought to be due to recurrent HCAP. She was treated for full course of treatment for HCAP now has intermittent fever in the last 48hrs, and clinically stable  - recommend to check CBC with diff and BMP tomorrow, - if still having fever tomorrow, would repeat blood cx, sputum cx, ua and urine cx - unlikely to have new pneumonia process and agree that she may be suffering for chronic aspiration. For now, would not empirically restart antibiotics   Quaneisha Hanisch B. [facility] [phone]

## 2014-09-03 NOTE — Progress Notes (Signed)
eLink Physician-Brief Progress Note Patient Name: Martha Mcbride DOB: 1925-06-22 MRN: [id number]   Date of Service  09/03/2014  HPI/Events of Note  RN reports fingers turning blue Seen by vascular for gangrene in toes 07/2014 TEE neg endocarditis 06/2014 H/o polymylagia  eICU Interventions  DD incl vasculitis vs HIT Dc lovenox, send HIT panel, start argatroban Solumedrol 40 q 12h, send ANCA     Intervention Category Major Interventions: Other:  Evonne Rinks V. 09/03/2014, 6:54 PM

## 2014-09-03 NOTE — Progress Notes (Signed)
NUTRITION FOLLOW UP  Intervention:  -Continue Jevity 1.2 at goal rate of 200 ml (approximately 6 oz) five times daily with Pro-Stat to TID to provide 1500 kcal (100% est protein needs), 85 gram protein (100% est protein needs), and 807 ml free water. -On d/c, Recommend 60 ml flush before and after each bolus feeding, + 120 ml flush per day to meet estimated fluid needs  -FiberCon fiber supplement once daily -Imodium PRN & Scheduled BID -RD to monitor daily   Nutrition Dx:   Inadequate oral intake related to inability to eat as evidenced by NPO status; ongoing  Goal:   TF to meet >/= 90% of their estimated nutrition needs; progressing  Monitor:   TF tolerance, GI profile, total protein/energy intake, labs, weights  Assessment:   11/29: Pt recently discharged on 11/13.  11/28 Daughters spoke with on-call RD regarding diarrhea and TF. TF was stopped d/t diarrhea. C.Diff pending. Per H&P, PTA patient was receiving 1 can (237 ml) of Jevity 1.2, 5 times daily. This provides 1422 kcal and 66 grams of protein.   11/29 Spoke with Kathe Becton, the pt's daughters regarding TF plan for pt. Pt's daughters request daily monitoring of pt.  Explained to family the plan per MD of trickle feeds of Jevity 1.2 pending c.diff results and chest scan.  Per MD note, pt with possible chronic aspiration despite PEG d/t hiatal hernia. Possible consideration of MBS. Per RN, MD may start trickle feeds at 20 ml/hr, would recommend restarting TF at a lower rate of 10 ml/hr, family concerned for pt's tolerance of continuous TF.  RD to follow-up in am to evaluate patient and tolerance of trickle feeds (if started).  11/30: -Pt negative for C.diff -RD received consult for enteral nutrition initiation, and request for additional fiber additives -Discussed TF with patient and pt's daughter. -Plan to initiate trickle feeds for 24 hour, and to advance as tolerated. Discussed fiber supplements available with  pharmacy; PharmD noted available form in tablet is able to be crushed and administered via PEG.  -RD will order Fibercon once daily and increase to recommended dosage of two tablets daily as needed  12/01: -RN reported pt without loose bowel movement yet during shift -Per discussion with family, pt had two small bowel movements yesterday and overnight -Discussed tube feeding regimen with NP. NP noted concern for reflux/aspiration r/t hiatal hernia. Requested to transition to home bolus regimen -Will trial half dosage (120 ml five times daily) and assess tolerance.  -Protein supplement added to better meet calorie and protein needs -Continue with FiberCon, RN reported she had not yet been given supplement, but planned on providing at [place] today -Pt may benefit from smaller frequent bolus w/Pro-Stat vs five 237 ml daily to assist with loose stools upon d/c. RD to continue to monitor to assess tolerance  12/02: -Pt's daughter reported two smear-like episodes of stool this morning and one pudding-like episode yesterday after bolus feed -Tolerating 120 ml QID, and receiving Pro-Stat BID. Received FiberCon daily since 11/30. Imodium ordered PRN, and given 11/30 -Will progress to home regimen, but modify volume to goal rate of 4.5 cans daily with addition of Pro-Stat BID  12/03: -Pt's daughter declining advancement of tube feed due to loose stools and nausea -Receiving Jevity 1.2 at 120 ml five times daily, and pro-stat BID. Advanced to TID while receiving half doses of tube feeding -Pt receiving Imodium BID, which daughter noted is assisting in forming stool -Consider regimen of 200 ml (approximately 6oz)  five times daily with Pro-Stat TID to provide 1500 kcal, 85 gram protein if pt unable to tolerate 237 ml five times daily -Pt's daughter noted pt may enjoy four feedings vs five at home as she is tired when the 5th bolus is scheduled.  12/04: -Family continues to decline advancement d/t pt  feeling full after 4oz, and with new fever.  -Loose stools improving with BM. Continue with Imodium PRN -Will place instructions to advance tomorrow  12/6: -Pt with some diarrhea today. Imodium is provided PRN. -Pt resting during visit. Daughter Martha Mcbride in room. -Family is fine with current rate of 200 ml 5 times a day. -Family with no questions at this time.  12/7: -Pt with 3 instances of loose stools per family. -Imodium is now scheduled BID as well as PRN.  -Per family, pt was not provided TF as scheduled during the night. -Will continue at current TF rate. -Family with no questions today.  Height: Ht Readings from Last 1 Encounters:  08/24/14 5\' 3"  (1.6 m)    Weight Status:   Wt Readings from Last 1 Encounters:  09/03/14 141 lb 5 oz (64.1 kg)    Re-estimated needs:  Kcal: 1400-1600 Protein: 80-95g Fluid: 1.5L/day  Skin: Stage III Pressure ulcer on buttocks, open necrotic toe wound   Diet Order: Diet NPO time specified   Intake/Output Summary (Last 24 hours) at 09/03/14 1534 Last data filed at 09/03/14 1610  Gross per 24 hour  Intake    710 ml  Output      0 ml  Net    710 ml    Last BM: 12/6   Labs:   Recent Labs Lab 08/30/14 0535 08/31/14 0527 09/01/14 0512  NA 139 139 138  K 3.8 4.1 4.5  CL 105 105 104  CO2 21 22 22   BUN 11 15 16   CREATININE 0.47* 0.49* 0.47*  CALCIUM 7.5* 7.8* 7.9*  MG 1.8 1.9  --   GLUCOSE 91 102* 86    CBG (last 3)   Recent Labs  09/03/14 0631 09/03/14 1001 09/03/14 1405  GLUCAP 95 140* 142*    Scheduled Meds: . antiseptic oral rinse  7 mL Mouth Rinse q12n4p  . aspirin  81 mg Per Tube Daily  . chlorhexidine  15 mL Mouth Rinse BID  . [START ON 09/04/2014] enoxaparin (LOVENOX) injection  30 mg Subcutaneous Q24H  . feeding supplement (JEVITY 1.2 CAL)  200 mL Per Tube 5 X Daily  . feeding supplement (PRO-STAT SUGAR FREE 64)  30 mL Per Tube TID WC  . free water  120 mL Per Tube QID  . furosemide  20 mg Oral Daily   . insulin aspart  2-6 Units Subcutaneous 5 times per day  . loperamide  2 mg Per Tube BID  . metoprolol tartrate  6.25 mg Per Tube BID  . polycarbophil  625 mg Per Tube Daily  . potassium chloride  40 mEq Per Tube Daily  . prednisoLONE  4.5 mg Per Tube QAC breakfast  . ranitidine  150 mg Per Tube QHS  . saccharomyces boulardii  250 mg Oral BID    Continuous Infusions:    Clayton Bibles, MS, RD, LDN Pager: [phone] After Hours Pager: [phone]

## 2014-09-03 NOTE — Progress Notes (Signed)
Physical Therapy Treatment Patient Details Name: Martha Mcbride MRN: [id number] DOB: 1925-08-14 Today's Date: 09/03/2014    History of Present Illness 80 year old with interstitial lung disease, presumed IPF, polymyalgia on chronic prednisone, chronic diastolic heart failure and severe dysphagia status post PEG presents with her third episode of HCAP in the last 6 weeks, with severe sepsis, . Patient recently in hospital.    PT Comments    Pt activity tolerance remains limited due to fatigue and weakness.  Pt was shaky and unsteady with gait, requiring min A for RW management min A for correcting one LOB during gait.  Pt is a high fall risk; recommend SNF  Follow Up Recommendations  SNF     Equipment Recommendations  None recommended by PT    Recommendations for Other Services       Precautions / Restrictions Precautions Precautions: Fall Precaution Comments: NPO,PEG  needs  air cushions in chair. Restrictions Weight Bearing Restrictions: No    Mobility  Bed Mobility Overal bed mobility: Needs Assistance Bed Mobility: Supine to Sit     Supine to sit: Mod assist Sit to supine: HOB elevated   General bed mobility comments: A for LEs and manual forward scoot with bedpad  Transfers Overall transfer level: Needs assistance Equipment used: Rolling walker (2 wheeled) Transfers: Sit to/from Stand Sit to Stand: Min guard         General transfer comment: performed x3, A to BSC; tolerated 3 minutes of standing balance for A with hygiene but became very shaky toward the end of 3 minutes; increased time to rise and stabilize  Ambulation/Gait Ambulation/Gait assistance: Min assist Ambulation Distance (Feet): 30 Feet (10 ft)   Gait Pattern/deviations: Step-to pattern;Decreased stride length;Shuffle;Trunk flexed Gait velocity: decr   General Gait Details: VCs for hand placement and min tactile cues for RW management; pt fatigues easily after 30 feet of gait and required one  sitting rest break, then attempted gait back to room but pt too weak after 10 feet; LOB, shaky, unsteady; HIGH fall risk   Stairs            Wheelchair Mobility    Modified Rankin (Stroke Patients Only)       Balance                                    Cognition Arousal/Alertness: Awake/alert Behavior During Therapy: WFL for tasks assessed/performed Overall Cognitive Status: Within Functional Limits for tasks assessed                      Exercises      General Comments        Pertinent Vitals/Pain Pain Assessment: No/denies pain    Home Living                      Prior Function            PT Goals (current goals can now be found in the care plan section) Progress towards PT goals: Progressing toward goals    Frequency  Min 3X/week    PT Plan Current plan remains appropriate    Co-evaluation             End of Session Equipment Utilized During Treatment: Gait belt Activity Tolerance: Patient limited by fatigue Patient left: in chair;with call bell/phone within reach;with family/visitor present     Time: 1126-1208 PT Time  Calculation (min) (ACUTE ONLY): 42 min  Charges:                       G Codes:      Dakarri Kessinger, SPTA 09/03/2014, 1:38 PM

## 2014-09-04 ENCOUNTER — Other Ambulatory Visit: Payer: Self-pay | Admitting: *Deleted

## 2014-09-04 DIAGNOSIS — I96 Gangrene, not elsewhere classified: Secondary | ICD-10-CM

## 2014-09-04 DIAGNOSIS — R5081 Fever presenting with conditions classified elsewhere: Secondary | ICD-10-CM

## 2014-09-04 DIAGNOSIS — D696 Thrombocytopenia, unspecified: Secondary | ICD-10-CM

## 2014-09-04 DIAGNOSIS — I739 Peripheral vascular disease, unspecified: Secondary | ICD-10-CM

## 2014-09-04 LAB — GLUCOSE, CAPILLARY
GLUCOSE-CAPILLARY: 222 mg/dL — AB (ref 70–99)
GLUCOSE-CAPILLARY: 200 mg/dL — AB (ref 70–99)
Glucose-Capillary: 126 mg/dL — ABNORMAL HIGH (ref 70–99)
Glucose-Capillary: 43 mg/dL — CL (ref 70–99)
Glucose-Capillary: 198 mg/dL — ABNORMAL HIGH (ref 70–99)

## 2014-09-04 LAB — BASIC METABOLIC PANEL
ANION GAP: 9 (ref 5–15)
BUN: 19 mg/dL (ref 6–23)
CALCIUM: 8.1 mg/dL — AB (ref 8.4–10.5)
CO2: 27 mEq/L (ref 19–32)
Chloride: 99 mEq/L (ref 96–112)
Creatinine, Ser: 0.47 mg/dL — ABNORMAL LOW (ref 0.50–1.10)
GFR, EST NON AFRICAN AMERICAN: 86 mL/min — AB (ref >90)
Glucose, Bld: 100 mg/dL — ABNORMAL HIGH (ref 70–99)
POTASSIUM: 4.1 meq/L (ref 3.7–5.3)
Sodium: 135 mEq/L — ABNORMAL LOW (ref 137–147)

## 2014-09-04 LAB — CBC
HCT: 30.8 % — ABNORMAL LOW (ref 36.0–46.0)
Hemoglobin: 10 g/dL — ABNORMAL LOW (ref 12.0–15.0)
MCH: 31.3 pg (ref 26.0–34.0)
MCHC: 32.5 g/dL (ref 30.0–36.0)
MCV: 96.6 fL (ref 78.0–100.0)
Platelets: 139 10*3/uL — ABNORMAL LOW (ref 150–400)
RBC: 3.19 MIL/uL — AB (ref 3.87–5.11)
RDW: 21.9 % — AB (ref 11.5–15.5)
WBC: 6 10*3/uL (ref 4.0–10.5)

## 2014-09-04 LAB — APTT: APTT: 69 s — AB (ref 24–37)

## 2014-09-04 MED ORDER — ACETAMINOPHEN 160 MG/5ML PO SOLN
650.0000 mg | Freq: Four times a day (QID) | ORAL | Status: DC | PRN
  Administered 2014-09-04 – 2014-09-05 (×2): 650 mg
  Filled 2014-09-04 (×2): qty 20.3

## 2014-09-04 NOTE — Progress Notes (Signed)
NUTRITION FOLLOW UP  Intervention:  -Continue Jevity 1.2 at goal rate of 200 ml (approximately 6 oz) five times daily with Pro-Stat to TID to provide 1500 kcal (100% est protein needs), 85 gram protein (100% est protein needs), and 807 ml free water. -On d/c, Recommend 60 ml flush before and after each bolus feeding, + 120 ml flush per day to meet estimated fluid needs  -Discontinue FiberCon fiber supplement once daily -Imodium PRN & Scheduled BID -RD to monitor daily   Nutrition Dx:   Inadequate oral intake related to inability to eat as evidenced by NPO status; ongoing  Goal:   TF to meet >/= 90% of their estimated nutrition needs; progressing  Monitor:   TF tolerance, GI profile, total protein/energy intake, labs, weights  Assessment:   11/29: Pt recently discharged on 11/13.  11/28 Daughters spoke with on-call RD regarding diarrhea and TF. TF was stopped d/t diarrhea. C.Diff pending. Per H&P, PTA patient was receiving 1 can (237 ml) of Jevity 1.2, 5 times daily. This provides 1422 kcal and 66 grams of protein.   11/29 Spoke with Kathe Becton, the pt's daughters regarding TF plan for pt. Pt's daughters request daily monitoring of pt.  Explained to family the plan per MD of trickle feeds of Jevity 1.2 pending c.diff results and chest scan.  Per MD note, pt with possible chronic aspiration despite PEG d/t hiatal hernia. Possible consideration of MBS. Per RN, MD may start trickle feeds at 20 ml/hr, would recommend restarting TF at a lower rate of 10 ml/hr, family concerned for pt's tolerance of continuous TF.  RD to follow-up in am to evaluate patient and tolerance of trickle feeds (if started).  11/30: -Pt negative for C.diff -RD received consult for enteral nutrition initiation, and request for additional fiber additives -Discussed TF with patient and pt's daughter. -Plan to initiate trickle feeds for 24 hour, and to advance as tolerated. Discussed fiber supplements available  with pharmacy; PharmD noted available form in tablet is able to be crushed and administered via PEG.  -RD will order Fibercon once daily and increase to recommended dosage of two tablets daily as needed  12/01: -RN reported pt without loose bowel movement yet during shift -Per discussion with family, pt had two small bowel movements yesterday and overnight -Discussed tube feeding regimen with NP. NP noted concern for reflux/aspiration r/t hiatal hernia. Requested to transition to home bolus regimen -Will trial half dosage (120 ml five times daily) and assess tolerance.  -Protein supplement added to better meet calorie and protein needs -Continue with FiberCon, RN reported she had not yet been given supplement, but planned on providing at [place] today -Pt may benefit from smaller frequent bolus w/Pro-Stat vs five 237 ml daily to assist with loose stools upon d/c. RD to continue to monitor to assess tolerance  12/02: -Pt's daughter reported two smear-like episodes of stool this morning and one pudding-like episode yesterday after bolus feed -Tolerating 120 ml QID, and receiving Pro-Stat BID. Received FiberCon daily since 11/30. Imodium ordered PRN, and given 11/30 -Will progress to home regimen, but modify volume to goal rate of 4.5 cans daily with addition of Pro-Stat BID  12/03: -Pt's daughter declining advancement of tube feed due to loose stools and nausea -Receiving Jevity 1.2 at 120 ml five times daily, and pro-stat BID. Advanced to TID while receiving half doses of tube feeding -Pt receiving Imodium BID, which daughter noted is assisting in forming stool -Consider regimen of 200 ml (approximately  6oz) five times daily with Pro-Stat TID to provide 1500 kcal, 85 gram protein if pt unable to tolerate 237 ml five times daily -Pt's daughter noted pt may enjoy four feedings vs five at home as she is tired when the 5th bolus is scheduled.  12/04: -Family continues to decline advancement d/t pt  feeling full after 4oz, and with new fever.  -Loose stools improving with BM. Continue with Imodium PRN -Will place instructions to advance tomorrow  12/6: -Pt with some diarrhea today. Imodium is provided PRN. -Pt resting during visit. Daughter Mickie Kozikowski Janus in room. -Family is fine with current rate of 200 ml 5 times a day. -Family with no questions at this time.  12/7: -Pt with 3 instances of loose stools per family. -Imodium is now scheduled BID as well as PRN.  -Per family, pt was not provided TF as scheduled during the night. -Will continue at current TF rate. -Family with no questions today.  12/8  -Family reports loose stools again today. -Pt likely to go home tomorrow. Family would like education prior to discharge. RD to follow-up before discharge -Continue TF at current rate -Family concerned that Fiber supplement is contributing to diarrhea. RD to discontinue order to see if stools improve. -Discussed continuing Imodium BID when pt is discharged.  Height: Ht Readings from Last 1 Encounters:  08/24/14 5\' 3"  (1.6 m)    Weight Status:   Wt Readings from Last 1 Encounters:  09/04/14 142 lb 8 oz (64.638 kg)    Re-estimated needs:  Kcal: 1400-1600 Protein: 80-95g Fluid: 1.5L/day  Skin: Stage III Pressure ulcer on buttocks, open necrotic toe wound   Diet Order: Diet NPO time specified  No intake or output data in the 24 hours ending 09/04/14 1617  Last BM: 12/7   Labs:   Recent Labs Lab 08/30/14 0535 08/31/14 0527 09/01/14 0512 09/03/14 1928 09/04/14 0321  NA 139 139 138 137 135*  K 3.8 4.1 4.5 4.2 4.1  CL 105 105 104 100 99  CO2 21 22 22 26 27   BUN 11 15 16 21 19   CREATININE 0.47* 0.49* 0.47* 0.47* 0.47*  CALCIUM 7.5* 7.8* 7.9* 8.2* 8.1*  MG 1.8 1.9  --   --   --   GLUCOSE 91 102* 86 151* 100*    CBG (last 3)   Recent Labs  09/04/14 0632 09/04/14 1108 09/04/14 1414  GLUCAP 126* 43* 198*    Scheduled Meds: . antiseptic oral rinse  7 mL  Mouth Rinse q12n4p  . aspirin  81 mg Per Tube Daily  . chlorhexidine  15 mL Mouth Rinse BID  . feeding supplement (JEVITY 1.2 CAL)  200 mL Per Tube 5 X Daily  . feeding supplement (PRO-STAT SUGAR FREE 64)  30 mL Per Tube TID WC  . free water  120 mL Per Tube QID  . furosemide  20 mg Oral Daily  . insulin aspart  2-6 Units Subcutaneous 5 times per day  . loperamide  2 mg Per Tube BID  . methylPREDNISolone (SOLU-MEDROL) injection  40 mg Intravenous Q12H  . metoprolol tartrate  6.25 mg Per Tube BID  . polycarbophil  625 mg Per Tube Daily  . potassium chloride  40 mEq Per Tube Daily  . ranitidine  150 mg Per Tube QHS  . saccharomyces boulardii  250 mg Oral BID    Continuous Infusions:    Clayton Bibles, MS, RD, LDN Pager: [phone] After Hours Pager: [phone]

## 2014-09-04 NOTE — Progress Notes (Addendum)
- Name: Martha Mcbride MRN: [id number] DOB: [birth year]    ADMISSION DATE:  08/24/2014  REFERRING MD :  EDP  CHIEF COMPLAINT:  Generalized weakness, fever  BRIEF PATIENT DESCRIPTION: 80 year old with interstitial lung disease, presumed IPF, polymyalgia on chronic prednisone, chronic diastolic heart failure and severe dysphagia status post PEG presents with her third episode of HCAP in the last 6 weeks, with severe sepsis, lactate of 6.  She also had a new malar rash on admission.   SIGNIFICANT EVENTS  11/28  Frequent diarrhea, CDiff neg, TF held  12/02  Moved to floor 12/03  Ongoing loose stools, imodium helping 12/04  Diarrhea improved, cxr unchanged.  Temp 100.7 rectal  STUDIES:  07/30/14 CT Chest >> Evidence of emphysema with traction bronchiectasis in multiple areas of interstitial fibrosis. Appearance is consistent with underlying usual interstitial pneumonitis. There are areas of patchy consolidation in both lower lobes as wellas to a lesser extent in the upper lobes. There also moderate pleural effusions. The areas of consolidation are felt to most likely represent superimposed pneumonia. There may be a degree of superimposed congestive heart failure as well given the pleural effusions .5 mm nodular opacity in the posterior segment of the right upper lobe. Areas of atherosclerotic change and cardiac valvular calcification. Areas of relatively mild adenopathy. Cholelithiasis. 11/30 DG UGI with KUB >> well positioned g-tube, no extravasation of contrast media, opacification of moderate sized hiatal hernia with reflux to the level of the thoracic inlet while supine, no aspiration observed, incidental finding of duodenal diverticula    Historical Data: 04/2012 CT abd >>large hiatal hernia . Prominent interstitial lung markings suggest chronic changes  06/2014 TEE mild MS, mod AS, nml LVfn 07/24/14  Admit for HCAP, encephalopathy, & dHF exacerbation.  07/28/14 Admit with fever 103,  confusion,productive cough.  WBC 12, Lactic acid at 3.5. Chest x ray with diffuse bilateral pulmonary interstitial infiltrates & eosinophilia (AEC 1800 on 10/22 which came down to 600 on 10/31). Diarrhea is resolved & felt unlikely to be parasitic by ID - stool O & P neg. 07/29/14  Ova and parasite stool neg. Strongyloides antibody neg, Quantiferon Tb neg  12/7 cyanosis of fingers -argatroban started   SUBJECTIVE:    loose stools  defervesced  No increased sob Transient cyanosis of BL fingers   VITAL SIGNS: Temp:  [97.6 F (36.4 C)-99 F (37.2 C)] 97.6 F (36.4 C) (12/08 0705) Pulse Rate:  [87-100] 89 (12/08 0705) Resp:  [16-20] 16 (12/08 0705) BP: (100-120)/(51-61) 120/55 mmHg (12/08 0705) SpO2:  [95 %-100 %] 99 % (12/08 0705) Weight:  [64.638 kg (142 lb 8 oz)] 64.638 kg (142 lb 8 oz) (12/08 0705)  PHYSICAL EXAMINATION: Gen. Chronically ill, elderly woman in no distress ENT - nose without purulence, no LN or TMG Lungs: bibasilar crackles, decreased depth inspiration, no wheezing   Cardiovascular: rrr, 3/6 blowing systolic murmur Abdomen: soft and non-tender, BS normal. LE with mild edema, no cyanosis of fingers, dry gangrenous rt toes. Neuro:  alert, non focal    Recent Labs Lab 09/01/14 0512 09/03/14 1928 09/04/14 0321  NA 138 137 135*  K 4.5 4.2 4.1  CL 104 100 99  CO2 22 26 27   BUN 16 21 19   CREATININE 0.47* 0.47* 0.47*  GLUCOSE 86 151* 100*    Recent Labs Lab 09/01/14 0512 09/03/14 1920 09/04/14 0321  HGB 10.0* 9.6* 10.0*  HCT 30.8* 30.0* 30.8*  WBC 6.4 8.0 6.0  PLT 131* 126* 139*  ASSESSMENT / PLAN:  Healthcare associated pneumonia - recurrent, likely aspiration.   Suspected chronic aspiration Suspected superimposed cardiogenic pulm edema Underlying interstitial lung disease, presumed IPF  Sore throat - prior to admission    Plan:Minimise antibiotic exposure, high risk for c diff   CHF - without decompensation  Plan: Continue home  Metoprolol  Continue home dose lasix 20 mg QD  Dysphagia - keep nothing by mouth for now.  Last SLP evaluation 11/3 notable for aspiration of nectar thick liquids, aspiration of secretions with barium without awareness.  11/30 DG UGI with aspiration to level of thoracic inlet without overt aspiration but further supports concerns of chronic aspiration.   Hiatal Hernia - likely still aspirating, in spite of her PEG due to hiatal hernia Last SLP notes reflected aspiration of barium without awareness.  -IR would like to wait 6 wks (until mid dec) prior to changing to PEJ  Diarrhea - likely due to combo abx, TF's.  Resolved.   Plan: Scheduled & PRN imodium . Nutrition to added fiber to TF formula Florastor   Protein Calorie Malnutrition GERD  Plan: TF, bolus feedings to mimic home feeding.    Protein (will reduce overall volume), fiber supplement Not  a candidate for surgical repair of hernia See speech d/c recs in notes.  Polymyalgia  New malar rash -   Resolved - ANA 1: 40 pos  ? Vasculitis, doubt HIT Plan:  Home dose 4.5 mg QD pred, will increase to 40 bid solumedrol & confer with [name] Persistent fevers remain a concern but seem to be related to ongoing aspiration Send HITT panel, dc argatroban  -risk higher than benefit  Sacral Decubitus, Stage III - had prior to admission.   Dry Gangrene of R Toes   Plan: WOC following Monitor toes, no acute interventions.  Previously seen by [facility] 07/2014).   Deconditioning  Dementia - mild  Plan: PT Consult Mobilize, out of bed BID, pt has been reluctant due to sacral ulcer. CIR consult/ if not approved plan for  Home with services -daughters prefer home to facility  I spoke to daughters at bedside.Continuing conversation about setting realistic goals Discussed with [name], will plan on 40 mg prednisone on discharge, ANCA neg in the past  The patient is critically ill with multiple organ systems failure and  requires high complexity decision making for assessment and support, frequent evaluation and titration of therapies, application of advanced monitoring technologies and extensive interpretation of multiple databases. Critical Care Time devoted to patient care services described in this note independent of APP time is 35 minutes.     Rigoberto Noel MD

## 2014-09-04 NOTE — Progress Notes (Addendum)
ANTICOAGULATION CONSULT NOTE - Initial Consult  Pharmacy Consult for Argatroban Indication: r/o HIT  Allergies  Allergen Reactions  . Doxycycline Nausea And Vomiting  . Other Nausea And Vomiting and Other (See Comments)    Anesthesia makes her very sleepy and makes it hard for her to come out of it.   Marland Kitchen Penicillins Hives and Nausea And Vomiting  . Sulfa Antibiotics Nausea And Vomiting    Patient Measurements: Height: 5\' 3"  (160 cm) Weight: 141 lb 5 oz (64.1 kg) IBW/kg (Calculated) : 52.4  Vital Signs: Temp: 99 F (37.2 C) (12/08 0410) Temp Source: Oral (12/08 0410) BP: 102/54 mmHg (12/08 0410) Pulse Rate: 87 (12/08 0410)  Labs:  Recent Labs  09/03/14 1920 09/03/14 1928 09/03/14 2200 09/04/14 0321  HGB 9.6*  --   --  10.0*  HCT 30.0*  --   --  30.8*  PLT 126*  --   --  139*  APTT 36  --  63* 69*  LABPROT 14.1  --   --   --   INR 1.08  --   --   --   CREATININE  --  0.47*  --  0.47*    Estimated Creatinine Clearance: 44.7 mL/min (by C-G formula based on Cr of 0.47).   Medical History: Past Medical History  Diagnosis Date  . Arthritis   . Polymyalgia   . Acid reflux   . Immune deficiency disorder     autoimmune disorder - polymyalgia - on prednisone  . Breast cancer     Treated with lumpectomy and radiation  . Giardia   . Pinworms   . History of hiatal hernia   . Pneumonia   . Dementia   . CHF (congestive heart failure)   . Dysrhythmia     Medications:  Scheduled:  . antiseptic oral rinse  7 mL Mouth Rinse q12n4p  . aspirin  81 mg Per Tube Daily  . chlorhexidine  15 mL Mouth Rinse BID  . feeding supplement (JEVITY 1.2 CAL)  200 mL Per Tube 5 X Daily  . feeding supplement (PRO-STAT SUGAR FREE 64)  30 mL Per Tube TID WC  . free water  120 mL Per Tube QID  . furosemide  20 mg Oral Daily  . insulin aspart  2-6 Units Subcutaneous 5 times per day  . loperamide  2 mg Per Tube BID  . methylPREDNISolone (SOLU-MEDROL) injection  40 mg Intravenous Q12H   . metoprolol tartrate  6.25 mg Per Tube BID  . polycarbophil  625 mg Per Tube Daily  . potassium chloride  40 mEq Per Tube Daily  . ranitidine  150 mg Per Tube QHS  . saccharomyces boulardii  250 mg Oral BID   Infusions:  . argatroban 64 mcg/min (09/03/14 2032)    Assessment: 36 yoF admitted on 11/27 from urgent care center for recurrent HCAP.  On 12/7 the RN noted the all fingers of left hand and 3 fingers of right hand turning blue and cold to touch.  Concern for possible vasculitis vs HIT.  Pharmacy is consulted to dose Argatroban.   Inpatient Anticoagulation:  Heparin SQ (11/27 - 12/7, last given at 0627), Lovenox (NOT started, due to begin 12/8)  SCr 0.47 (last on 12/5) and AST/ALT 25/18 ( last on 11/27)  CBC: Hgb 10, Plt 131 (last on 12/5)  Baseline Plt 165 at admission 08/24/14.  HIT panel ordered, pending collection 12/7  1st aPtt 2200= 63 sec  No bleeding/problems per RN 12/8  2nd aPtt 0330 = 69 sec  No bleeding or problems reported by RN  Plt improving:  126>139    Goal of Therapy:  aPTT 50-90 seconds Monitor platelets by anticoagulation protocol: Yes   Plan:   Continue Argatroban IV infusion 1 mcg/kg/min = 64 mcg/min (3.8 ml/hr)  Check APTT 2 hours after starting and q2 hours until two therapeutic levels are obtained.   Daily APTT & CBC while on argatroban.  Recheck aPtt daily   Dorrene German 09/04/2014 5:23 AM

## 2014-09-04 NOTE — Progress Notes (Signed)
CSW continuing to follow.  CSW and RNCM followed up with pt and pt daughters at bedside.  Pt daughters discussed that they were able to meet with the doctor today. Pt daughters stated that they have determined that they wish for pt to return home with home health and family assist. Pt daughters have been pleased with [facility] and would like [facility] to continue in the home.   Pt daughters appreciative of CSW exploring SNF options, but do not feel that the options given can manage pt nursing care needs.   No further social work needs identified at this time.  CSW signing off.   Please re-consult if further social work needs arise.   Alison Murray, MSW, [place] [facility] [phone]

## 2014-09-04 NOTE — Care Management Note (Signed)
CARE MANAGEMENT NOTE 09/04/2014  Patient:  Martha Mcbride, Martha Mcbride   Account Number:  [id number]  Date Initiated:  08/27/2014  Documentation initiated by:  Martha Mcbride,Martha Mcbride  Subjective/Objective Assessment:   80 year old with interstitial lung disease, presumed IPF, polymyalgia on chronic prednisone, chronic diastolic heart failure and severe dysphagia status post PEG presents with her third episode of HCAP in the last 6 weeks, with severe sepsi     Action/Plan:   lives with adult children who are her care takers   Anticipated DC Date:  09/05/2014   Anticipated DC Plan:  [facility]  In-house referral  NA      DC Planning Services  CM consult      PAC Choice  NA   Choice offered to / List presented to:  NA   DME arranged  NA        HH arranged  HH-1 RN  [place]      [place].   Status of service:  In process, will continue to follow Medicare Important Message given?  YES (If response is "NO", the following Medicare IM given date fields will be blank) Date Medicare IM given:  08/31/2014 Medicare IM given by:  Martha Mcbride Date Additional Medicare IM given:  09/04/2014 Additional Medicare IM given by:  Martha Mcbride  Discharge Disposition:    Per UR Regulation:  Reviewed for med. necessity/level of care/duration of stay  If discussed at [place] of Stay Meetings, dates discussed:   08/30/2014  09/04/2014    Comments:  09/04/14 Martha Doctor RN,BSN,NCM [phone] Spoke with both daughters and pt at bedside.  Daughters would like to take pt home as they did not like the SNF that offered her a bed.  CIR did not think that pt was appropriate for their services at this time.  Pt was active with [facility] for RN/PT/OT/Aide/Speech/SW previously and will need MD order for resumption of services at DC.  Daughters are declining hospital bed at this time and will re-evaluate the  situation when they get home.  [facility] rep made aware of need for continued services.  Daughter Martha Mcbride with questions for [facility] rep and she was informed that Martha Mcbride would like to speak with her.  CM will continue to follow for DC needs.  [id number] Martha Hoes, RN, BSN, CCM Chart reviewed. Discharge needs and patient's stay to be reviewed and followed by case manager. Chart note for progression of stay: lungs have a few crackles, she is in good spirits today I think we are dealing with recurrent aspiration Will plan for Upper GI through PEG, have discussed with Dr. Graylon Good with radiology who recommends this to evaluate for esophageal reflux. Will consult PT, start tube feedings after the upper GI

## 2014-09-04 NOTE — Progress Notes (Signed)
ANTICOAGULATION CONSULT NOTE - Initial Consult  Pharmacy Consult for Argatroban Indication: r/o HIT  Allergies  Allergen Reactions  . Doxycycline Nausea And Vomiting  . Other Nausea And Vomiting and Other (See Comments)    Anesthesia makes her very sleepy and makes it hard for her to come out of it.   Marland Kitchen Penicillins Hives and Nausea And Vomiting  . Sulfa Antibiotics Nausea And Vomiting    Patient Measurements: Height: 5\' 3"  (160 cm) Weight: 141 lb 5 oz (64.1 kg) IBW/kg (Calculated) : 52.4  Vital Signs: Temp: 98.6 F (37 C) (12/07 2212) Temp Source: Oral (12/07 2212) BP: 100/51 mmHg (12/07 2212) Pulse Rate: 91 (12/07 2212)  Labs:  Recent Labs  09/01/14 0512 09/03/14 1920 09/03/14 1928 09/03/14 2200  HGB 10.0* 9.6*  --   --   HCT 30.8* 30.0*  --   --   PLT 131* 126*  --   --   APTT  --  36  --  63*  LABPROT  --  14.1  --   --   INR  --  1.08  --   --   CREATININE 0.47*  --  0.47*  --     Estimated Creatinine Clearance: 44.7 mL/min (by C-G formula based on Cr of 0.47).   Medical History: Past Medical History  Diagnosis Date  . Arthritis   . Polymyalgia   . Acid reflux   . Immune deficiency disorder     autoimmune disorder - polymyalgia - on prednisone  . Breast cancer     Treated with lumpectomy and radiation  . Giardia   . Pinworms   . History of hiatal hernia   . Pneumonia   . Dementia   . CHF (congestive heart failure)   . Dysrhythmia     Medications:  Scheduled:  . antiseptic oral rinse  7 mL Mouth Rinse q12n4p  . aspirin  81 mg Per Tube Daily  . chlorhexidine  15 mL Mouth Rinse BID  . feeding supplement (JEVITY 1.2 CAL)  200 mL Per Tube 5 X Daily  . feeding supplement (PRO-STAT SUGAR FREE 64)  30 mL Per Tube TID WC  . free water  120 mL Per Tube QID  . furosemide  20 mg Oral Daily  . insulin aspart  2-6 Units Subcutaneous 5 times per day  . loperamide  2 mg Per Tube BID  . methylPREDNISolone (SOLU-MEDROL) injection  40 mg Intravenous Q12H   . metoprolol tartrate  6.25 mg Per Tube BID  . polycarbophil  625 mg Per Tube Daily  . potassium chloride  40 mEq Per Tube Daily  . ranitidine  150 mg Per Tube QHS  . saccharomyces boulardii  250 mg Oral BID   Infusions:  . argatroban 64 mcg/min (09/03/14 2032)    Assessment: 38 yoF admitted on 11/27 from urgent care center for recurrent HCAP.  On 12/7 the RN noted the all fingers of left hand and 3 fingers of right hand turning blue and cold to touch.  Concern for possible vasculitis vs HIT.  Pharmacy is consulted to dose Argatroban.   Inpatient Anticoagulation:  Heparin SQ (11/27 - 12/7, last given at 0627), Lovenox (NOT started, due to begin 12/8)  SCr 0.47 (last on 12/5) and AST/ALT 25/18 ( last on 11/27)  CBC: Hgb 10, Plt 131 (last on 12/5)  Baseline Plt 165 at admission 08/24/14.  HIT panel ordered, pending collection 12/7  1st aPtt 2200= 63 sec  No bleeding/problems per RN  Goal of Therapy:  aPTT 50-90 seconds Monitor platelets by anticoagulation protocol: Yes   Plan:   Continue Argatroban IV infusion 1 mcg/kg/min = 64 mcg/min (3.8 ml/hr)  Check APTT 2 hours after starting and q2 hours until two therapeutic levels are obtained.   Daily APTT & CBC while on argatroban.  Recheck aPtt @ 0330 today   Dorrene German 09/04/2014 12:17 AM

## 2014-09-05 DIAGNOSIS — T17908D Unspecified foreign body in respiratory tract, part unspecified causing other injury, subsequent encounter: Secondary | ICD-10-CM

## 2014-09-05 LAB — GLUCOSE, CAPILLARY
GLUCOSE-CAPILLARY: 216 mg/dL — AB (ref 70–99)
Glucose-Capillary: 137 mg/dL — ABNORMAL HIGH (ref 70–99)
Glucose-Capillary: 148 mg/dL — ABNORMAL HIGH (ref 70–99)

## 2014-09-05 LAB — ANCA TITERS

## 2014-09-05 LAB — ANCA SCREEN W REFLEX TITER
Atypical p-ANCA Screen: NEGATIVE
C-ANCA SCREEN: POSITIVE — AB
p-ANCA Screen: NEGATIVE

## 2014-09-05 MED ORDER — LOPERAMIDE HCL 1 MG/5ML PO LIQD: 2.0000 mg | Freq: Two times a day (BID) | ORAL | Status: DC

## 2014-09-05 MED ORDER — PREDNISONE 5 MG/5ML PO SOLN: 40.0000 mg | Freq: Every day | ORAL | Status: DC

## 2014-09-05 MED ORDER — RANITIDINE HCL 75 MG/5ML PO SYRP: 150.0000 mg | ORAL_SOLUTION | Freq: Every day | ORAL | Status: DC

## 2014-09-05 MED ORDER — PREDNISONE 5 MG/5ML PO SOLN
40.0000 mg | Freq: Every day | ORAL | Status: DC
  Filled 2014-09-05: qty 40

## 2014-09-05 NOTE — Care Management Note (Signed)
CARE MANAGEMENT NOTE 09/05/2014  Patient:  Martha Mcbride, Martha Mcbride   Account Number:  [id number]  Date Initiated:  08/27/2014  Documentation initiated by:  DAVIS,RHONDA  Subjective/Objective Assessment:   80 year old with interstitial lung disease, presumed IPF, polymyalgia on chronic prednisone, chronic diastolic heart failure and severe dysphagia status post PEG presents with her third episode of HCAP in the last 6 weeks, with severe sepsi     Action/Plan:   lives with adult children who are her care takers   Anticipated DC Date:  09/05/2014   Anticipated DC Plan:  [place]  In-house referral  NA      DC Planning Services  CM consult      PAC Choice  NA   Choice offered to / List presented to:  NA   DME arranged  NA        HH arranged  HH-1 RN  [place]      [place].   Status of service:  In process, will continue to follow Medicare Important Message given?  YES (If response is "NO", the following Medicare IM given date fields will be blank) Date Medicare IM given:  08/31/2014 Medicare IM given by:  Marney Doctor Date Additional Medicare IM given:  09/04/2014 Additional Medicare IM given by:  [facility] Wave Calzada  Discharge Disposition:    Per UR Regulation:  Reviewed for med. necessity/level of care/duration of stay  If discussed at [place] of Stay Meetings, dates discussed:   08/30/2014  09/04/2014    Comments:  09/05/14 Marney Doctor RN,BSN,NCM Orders written for [facility].  [facility] aware.  CSW arranged ambulance transport home for pt.  Checked in with Daughter and she was appreciative of CM assistance.  09/04/14 Marney Doctor RN,BSN,NCM [phone] Spoke with both daughters and pt at bedside.  Daughters would like to take pt home as they did not like the SNF that offered her a bed.  CIR did not think that pt was appropriate for their services at this time.  Pt was  active with [facility] for RN/PT/OT/Aide/Speech/SW previously and will need MD order for resumption of services at DC.  Daughters are declining hospital bed at this time and will re-evaluate the situation when they get home.  [facility] rep made aware of need for continued services.  Daughter [name] with questions for [facility] rep and she was informed that [name] would like to speak with her.  CM will continue to follow for DC needs.  [id number] Rosana Hoes, RN, BSN, CCM Chart reviewed. Discharge needs and patient's stay to be reviewed and followed by case manager. Chart note for progression of stay: lungs have a few crackles, she is in good spirits today I think we are dealing with recurrent aspiration Will plan for Upper GI through PEG, have discussed with Dr. Graylon Good with radiology who recommends this to evaluate for esophageal reflux. Will consult PT, start tube feedings after the upper GI

## 2014-09-05 NOTE — Progress Notes (Signed)
Physical Therapy Treatment Patient Details Name: KASEN SAKO MRN: [id number] DOB: [birth year] Today's Date: 09/05/2014    History of Present Illness 80 year old with interstitial lung disease, presumed IPF, polymyalgia on chronic prednisone, chronic diastolic heart failure and severe dysphagia status post PEG presents with her third episode of HCAP in the last 6 weeks, with severe sepsis, . Patient recently in hospital.    PT Comments    Pt demo increased weakness and fatigue during gait training this tx.  Pt and daughter refused all bed offers from SNF and insist on taking pt home.  Pt continues to be unsafe/unsteady with gait.    Follow Up Recommendations  Home health PT (pt and daughter denied all bed offers from SNF; inform LPT)     Equipment Recommendations  None recommended by PT    Recommendations for Other Services       Precautions / Restrictions Precautions Precautions: Fall Precaution Comments: NPO,PEG  needs  air cushions in chair. Restrictions Weight Bearing Restrictions: No    Mobility  Bed Mobility               General bed mobility comments: pt seen OOB in recliner  Transfers Overall transfer level: Needs assistance Equipment used: Rolling walker (2 wheeled) Transfers: Sit to/from Stand Sit to Stand: Min guard         General transfer comment: performed x2; VCs for hand/foot placement  Ambulation/Gait Ambulation/Gait assistance: Min assist Ambulation Distance (Feet): 15 Feet (x2) Assistive device: Rolling walker (2 wheeled) Gait Pattern/deviations: Step-to pattern;Decreased stride length;Shuffle;Trunk flexed;Narrow base of support Gait velocity: decr   General Gait Details: pt very weak and fatigued quickly this tx compared to last; pt demo significantly increased weight shift/sway and increased knee buckling compared to last tx; requires frequent rest breaks   Stairs            Wheelchair Mobility    Modified Rankin (Stroke  Patients Only)       Balance                                    Cognition Arousal/Alertness: Awake/alert Behavior During Therapy: WFL for tasks assessed/performed Overall Cognitive Status: Within Functional Limits for tasks assessed                      Exercises      General Comments        Pertinent Vitals/Pain Pain Assessment: No/denies pain    Home Living                      Prior Function            PT Goals (current goals can now be found in the care plan section) Progress towards PT goals: Progressing toward goals    Frequency  Min 3X/week    PT Plan Current plan remains appropriate    Co-evaluation             End of Session Equipment Utilized During Treatment: Gait belt Activity Tolerance: Patient limited by fatigue Patient left: in chair;with call bell/phone within reach;with family/visitor present     Time: 7824-2353 PT Time Calculation (min) (ACUTE ONLY): 19 min  Charges:                       G Codes:      Miller,Derrick, Barry 09/05/2014, 12:02  PM   Reviewed above  Rica Koyanagi  PTA [facility]  Acute  Rehab Pager      [place] PT [phone]

## 2014-09-05 NOTE — Discharge Instructions (Signed)
Oxygen 2 liters w/ activity and sleep

## 2014-09-05 NOTE — Consult Note (Signed)
WOC wound consult note Reason for Consult: Pressure ulcer to left buttocks and sacrum, present on admission.  Pressure in conjunction with incontinence associated moisture.  Wound type: Stage II pressure ulcer to left buttocks and Stage III pressure ulcer to sacrum.  Pressure Ulcer POA: Yes Measurement: Left buttock 1 cm x 1 cm x 0.1 cm 100% pink and moist wound bed.  Sacrum 2 cm x 1 cm x 0.3 cm 100% pink and moist wound bed.  Wound bed: Drainage (amount, consistency, odor) minimal serous drainage.  Periwound: Intact Dressing procedure/placement/frequency: Cleanse ulcers to sacrum and left buttocks with NS and pat gently dry.  Apply zinc based ointment brought in by family.  Endit cream.  Apply twice daily and after each incontinent episode.  Turn and reposition every 2 hours.  Will not follow at this time.  Please re-consult if needed.  Domenic Moras RN BSN [place] Pager [phone]

## 2014-09-05 NOTE — Progress Notes (Signed)
NUTRITION FOLLOW UP  Details for home Feedings: Feedings per G-tube: TF: Jevity 1.2, 200 ml (6 oz) 5 times daily (200 ml at 6 am, 10 am, 2 pm, 6 pm, 10 pm) Prostat 30 ml TID Free water: 60 ml before and after each bolus feeding. Patient will need an additional 120 ml per day which will be met with med flushes.  Also recommended Imodium BID: 8 am and 8 pm.  Above to provide: 1500 kcal, 85 gm protein, 1407 ml free water. Estimated needs: 1400-1600 kcal, 80-95 gm protein, 1400-1600 ml free water daily.  Patient is tolerating feeding rate well. Continues loose stools but family reports no diarrhea after urinating today. Patient to D/C home today.  Diet NPO.  Spoke with daughter Kregg Cihlar Janus and educated her on the TF schedule, formula and amounts. TF for home book provided. Details about current feeding regime written in daughter's notebook. Daughter with no further questions.  Clayton Bibles, MS, RD, LDN Pager: [phone] After Hours Pager: [phone]

## 2014-09-05 NOTE — Clinical Social Work Note (Signed)
CSW advised this morning of plans for dc home with [facility]. CSW has completed EMS form for transport to home- awaiting callback from family to confirm address. RN updated and to call EMS when appropriate.    Eduard Clos, MSW, [place]

## 2014-09-06 LAB — HEPARIN INDUCED THROMBOCYTOPENIA PNL
Heparin Induced Plt Ab: NEGATIVE
PATIENT O. D.: 0.173
UFH HIGH DOSE UFH H: 3 %
UFH Low Dose 0.1 IU/mL: 7 % Release
UFH Low Dose 0.5 IU/mL: 8 % Release
UFH SRA RESULT: NEGATIVE

## 2014-09-07 ENCOUNTER — Telehealth: Payer: Self-pay | Admitting: Cardiology

## 2014-09-07 NOTE — Telephone Encounter (Signed)
Spoke with pt dtr, patient was recently discharged from the hosp and she is retaining a lot of fluid in her legs. She denies any SOB but she has been very active, she starts PT next week. Her bp is 108/84. Okay given for patient to take 20 mg of lasix twice daily for the next 3 days and then go back to 20 mg once daily. She will let me know when her follow up appointment is scheduled so we can get a BMP at that time. They will call back with cont problems.

## 2014-09-07 NOTE — Telephone Encounter (Signed)
PLease call,her feet are swollen. She has a question about her Lasix.

## 2014-09-10 ENCOUNTER — Other Ambulatory Visit: Payer: Self-pay | Admitting: *Deleted

## 2014-09-10 ENCOUNTER — Telehealth: Payer: Self-pay | Admitting: Pulmonary Disease

## 2014-09-10 ENCOUNTER — Telehealth: Payer: Self-pay | Admitting: Physician Assistant

## 2014-09-10 ENCOUNTER — Telehealth: Payer: Self-pay | Admitting: Cardiology

## 2014-09-10 MED ORDER — FUROSEMIDE 10 MG/ML PO SOLN: 20.0000 mg | Freq: Every day | ORAL | Status: DC

## 2014-09-10 MED ORDER — PREDNISONE 5 MG/5ML PO SOLN: 40.0000 mg | Freq: Every day | ORAL | Status: DC

## 2014-09-10 NOTE — Telephone Encounter (Signed)
Spoke with Leretha Dykes, advanced home care nurse informing her per Neoma Laming ok to send in prescription for furosemide. Appointment scheduled for this Week for post hospital. BMET will be drawn at that visit.

## 2014-09-10 NOTE — Telephone Encounter (Signed)
Pt called in wanting a new prescription for the pt's furosemide, she needs the dosage increased and some lab orders need to be put in for B-Met. Please call  Thanks

## 2014-09-10 NOTE — Telephone Encounter (Signed)
I spoke with Colletta Maryland from [facility] and she states the pt is needing a refill on prednisone liquid form, this was increased by Marni Griffon, NP at discharge on 09/05/14. Per discharge pt is currently taking 40mg  daily. Refill sent. [place] Bing, CMA

## 2014-09-10 NOTE — Telephone Encounter (Signed)
Left message to return a call to triage staff.

## 2014-09-10 NOTE — Telephone Encounter (Signed)
Wants to know if it is necessary for her mom to come in on Thursday , being that she just got out of the hospital . Please call

## 2014-09-10 NOTE — Telephone Encounter (Signed)
Returned a call to patient's daughter informed her per Hilda Blades that we routinely have patient's to get a post hospital visit especially if they have had problems since leaving the hospital, however if they don't feel that she needs to come the decision is totally up to them. The daughter states that they will try to keep the appointment.

## 2014-09-11 ENCOUNTER — Other Ambulatory Visit: Payer: Self-pay | Admitting: Pulmonary Disease

## 2014-09-12 ENCOUNTER — Encounter: Payer: Self-pay | Admitting: Vascular Surgery

## 2014-09-12 ENCOUNTER — Encounter: Payer: Self-pay | Admitting: Physician Assistant

## 2014-09-12 ENCOUNTER — Telehealth: Payer: Self-pay | Admitting: Cardiology

## 2014-09-12 ENCOUNTER — Ambulatory Visit (INDEPENDENT_AMBULATORY_CARE_PROVIDER_SITE_OTHER): Payer: Medicare Other | Admitting: Physician Assistant

## 2014-09-12 VITALS — BP 110/52 | HR 80 | Ht 60.0 in | Wt 137.6 lb

## 2014-09-12 DIAGNOSIS — I350 Nonrheumatic aortic (valve) stenosis: Secondary | ICD-10-CM

## 2014-09-12 DIAGNOSIS — Z79899 Other long term (current) drug therapy: Secondary | ICD-10-CM

## 2014-09-12 DIAGNOSIS — I5033 Acute on chronic diastolic (congestive) heart failure: Secondary | ICD-10-CM

## 2014-09-12 LAB — BASIC METABOLIC PANEL
BUN: 17 mg/dL (ref 6–23)
CALCIUM: 7.8 mg/dL — AB (ref 8.4–10.5)
CO2: 29 mEq/L (ref 19–32)
Chloride: 94 mEq/L — ABNORMAL LOW (ref 96–112)
Creat: 0.51 mg/dL (ref 0.50–1.10)
GLUCOSE: 203 mg/dL — AB (ref 70–99)
POTASSIUM: 4.1 meq/L (ref 3.5–5.3)
Sodium: 132 mEq/L — ABNORMAL LOW (ref 135–145)

## 2014-09-12 MED ORDER — FUROSEMIDE 10 MG/ML PO SOLN: 40.0000 mg | Freq: Every day | ORAL | Status: DC

## 2014-09-12 NOTE — Assessment & Plan Note (Signed)
Last 3 days she's been on increased Lasix dosing of 40 mg daily and potassium.. She reports increased urinary output.  We'll continue this dosing indefinitely but will check a be met today and in one week. She'll continue with daily weights and low-sodium diet. Follow-up in 2 weeks with extender.

## 2014-09-12 NOTE — Telephone Encounter (Signed)
Martha Mcbride is calling because they are needing a new prescription for her Lasix  sent her [place] w/ the new instrctions on it . Any questions please call    Thanks

## 2014-09-12 NOTE — Assessment & Plan Note (Signed)
Moderate my current TEE

## 2014-09-12 NOTE — Progress Notes (Signed)
Patient ID: Martha Mcbride, female   DOB: [birth year], 80 y.o.   MRN: [id number]    Date:  09/12/2014   ID:  Martha Mcbride, DOB 25-Jun-1925, MRN [id number]  PCP:  Kandice Hams, MD  Primary Cardiologist:  Stanford Breed     History of Present Illness: Martha Mcbride is a 80 y.o. female with a PMH of interstitial lung disease, presumed IPF, polymyalgia on chronic prednisone, chronic diastolic heart failure and severe dysphagia status post PEG who was admitted on 08/24/14 to [facility] after being seen at an urgent care for cough, fever and worsening weakness. Initial ER chest xray was concerning for RUL infiltrate. She was noted to have a lactic acid of 6 and hypotension that responded to volume resuscitation. This is her third HCAP in the past 6 weeks. She was also noted to have a new malar rash. The patient was admitted to the SDU for observation. She was pan cultured and to date are negative. We had planned on sending her home on 12/7 but she was having fever. We consulted infectious disease due to on-going fever. They did not feel that she was actively infected and recommended no further antibiotics. Additional concerns we addressed were on going aspiration for which we consulted IR to consider GJ tube (due to DG UGI series showing: reflux to the level of the thoracic inlet, and concern for further aspiration) but at this point the track from her current G tube has not matured enough so they advised this be re-considered in mid December. At this point the family is leaning towards just keeping the G tube as they are concerned about diarrhea r/t J tube feeding which is already an issue.  She had a TEE on 07/20/2014 which revealed an ejection fraction of 60-65%. There was moderate aortic stenosis. Mitral valve was severely calcified annulus moderately thickened leaflets. No thrombus in the left atrium. No valvular vegetations were noted.  Patient presents for posthospital evaluation. She is not  very mobile and is currently sitting in a wheelchair. She states that she feels "fair".  She does have significant edema in bilateral lower extremities and when she is seated she usually keeps her legs up.. Her right second toe is necrotic and she is being followed by Dr. Oneida Alar.  Because of her PEG tube she is sleeping 30 angle. She denies any shortness of breath or orthopnea, PND.  The patient currently denies nausea, vomiting, fever, chest pain, cough, congestion, abdominal pain, hematochezia, melena.  Wt Readings from Last 3 Encounters:  09/12/14 137 lb 9.6 oz (62.415 kg)  09/05/14 138 lb (62.596 kg)  08/14/14 141 lb (63.957 kg)     Past Medical History  Diagnosis Date  . Arthritis   . Polymyalgia   . Acid reflux   . Immune deficiency disorder     autoimmune disorder - polymyalgia - on prednisone  . Breast cancer     Treated with lumpectomy and radiation  . Giardia   . Pinworms   . History of hiatal hernia   . Pneumonia   . Dementia   . CHF (congestive heart failure)   . Dysrhythmia     Current Outpatient Prescriptions  Medication Sig Dispense Refill  . aspirin 81 MG chewable tablet Place 1 tablet (81 mg total) into feeding tube daily.    . folic acid (FOLVITE) 1 MG tablet Place 1 tablet (1 mg total) into feeding tube daily.    . furosemide (LASIX) 10 MG/ML solution Place 2  mLs (20 mg total) into feeding tube daily. (Patient taking differently: Place 40 mg into feeding tube daily. ) 60 mL 6  . hydroxypropyl methylcellulose / hypromellose (ISOPTO TEARS / GONIOVISC) 2.5 % ophthalmic solution Place 1 drop into both eyes every morning.    . loperamide (IMODIUM) 1 MG/5ML solution Place 10 mLs (2 mg total) into feeding tube 2 (two) times daily. May take 1 additional  dose or decrease as needed 120 mL 1  . metoprolol tartrate (LOPRESSOR) 25 mg/10 mL SUSP Place 2.5 mLs (6.25 mg total) into feeding tube 2 (two) times daily. 30 mL 12  . Nutritional Supplements (FEEDING SUPPLEMENT,  JEVITY 1.2 CAL,) LIQD Place 237 mLs into feeding tube 5 (five) times daily. 237 mL 0  . potassium chloride 20 MEQ/15ML (10%) SOLN Place 15 mLs (20 mEq total) into feeding tube daily. 450 mL 0  . predniSONE 5 MG/5ML solution Place 40 mLs (40 mg total) into feeding tube daily. 500 mL 2  . ranitidine (ZANTAC) 75 MG/5ML syrup Place 10 mLs (150 mg total) into feeding tube at bedtime. 180 mL 1  . saccharomyces boulardii (FLORASTOR) 250 MG capsule Take 1 capsule (250 mg total) by mouth 2 (two) times daily. (Patient taking differently: 250 mg by Feeding Tube route 2 (two) times daily. ) 60 capsule 0  . Water For Irrigation, Sterile (FREE WATER) SOLN Place 120 mLs into feeding tube 4 (four) times daily.     No current facility-administered medications for this visit.    Allergies:    Allergies  Allergen Reactions  . Doxycycline Nausea And Vomiting  . Other Nausea And Vomiting and Other (See Comments)    Anesthesia makes her very sleepy and makes it hard for her to come out of it.   Marland Kitchen Penicillins Hives and Nausea And Vomiting  . Sulfa Antibiotics Nausea And Vomiting    Social History:  The patient  reports that she has never smoked. She does not have any smokeless tobacco history on file. She reports that she does not drink alcohol or use illicit drugs.   Family history:   Family History  Problem Relation Age of Onset  . Stroke Mother     ROS:  Please see the history of present illness.  All other systems reviewed and negative.   PHYSICAL EXAM: VS:  BP 110/52 mmHg  Pulse 80  Ht 5' (1.524 m)  Wt 137 lb 9.6 oz (62.415 kg)  BMI 26.87 kg/m2 Well nourished, well developed, in no acute distress HEENT: Pupils are equal round react to light accommodation extraocular movements are intact.  Neck: no JVDNo cervical lymphadenopathy. Cardiac: Regular rate and rhythm 36 systolic murmur Lungs:  clear to auscultation bilaterally, no wheezing, rhonchi or rales Abd: soft, nontender, positive bowel sounds  all quadrants, no hepatosplenomegaly Ext: 2-3+ lower extremity edema.  2+ radial and dorsalis pedis pulses. Skin: warm and dry.  Second digit on her right foot is necrotic and dry. She has 2+ right dorsalis pedis and posterior tibialis pulses. Neuro:  Grossly normal    ASSESSMENT AND PLAN:  Problem List Items Addressed This Visit    Acute on chronic diastolic CHF (congestive heart failure)    Last 3 days she's been on increased Lasix dosing of 40 mg daily and potassium.. She reports increased urinary output.  We'll continue this dosing indefinitely but will check a be met today and in one week. She'll continue with daily weights and low-sodium diet. Follow-up in 2 weeks with extender.  Aortic stenosis    Moderate my current TEE     Other Visit Diagnoses    Polypharmacy    -  Primary    Relevant Orders       Basic metabolic panel       Basic metabolic panel          Blood pressure and heart rate are well controlled. I would not suggest compression socks particularly on the right foot given her necrotic toe.

## 2014-09-12 NOTE — Patient Instructions (Signed)
Please follow up with an extender in 2 weeks.  Martha Mcbride has ordered for you to have lab work done today.  Your physician recommends that you return for lab work in: 1 week  Please continue your Lasix 40 MG daily.  Your physician recommends that you weigh, daily, at the same time every day, and in the same amount of clothing. Please record your daily weights on the handout provided and bring it to your next appointment.  Low-Sodium Eating Plan Sodium raises blood pressure and causes water to be held in the body. Getting less sodium from food will help lower your blood pressure, reduce any swelling, and protect your heart, liver, and kidneys. We get sodium by adding salt (sodium chloride) to food. Most of our sodium comes from canned, boxed, and frozen foods. Restaurant foods, fast foods, and pizza are also very high in sodium. Even if you take medicine to lower your blood pressure or to reduce fluid in your body, getting less sodium from your food is important. WHAT IS MY PLAN? Most people should limit their sodium intake to 2,300 mg a day. Your health care provider recommends that you limit your sodium intake to a small amount each day.  WHAT DO I NEED TO KNOW ABOUT THIS EATING PLAN? For the low-sodium eating plan, you will follow these general guidelines:  Choose foods with a % Daily Value for sodium of less than 5% (as listed on the food label).   Use salt-free seasonings or herbs instead of table salt or sea salt.   Check with your health care provider or pharmacist before using salt substitutes.   Eat fresh foods.  Eat more vegetables and fruits.  Limit canned vegetables. If you do use them, rinse them well to decrease the sodium.   Limit cheese to 1 oz (28 g) per day.   Eat lower-sodium products, often labeled as "lower sodium" or "no salt added."  Avoid foods that contain monosodium glutamate (MSG). MSG is sometimes added to [place] food and some canned foods.  Check  food labels (Nutrition Facts labels) on foods to learn how much sodium is in one serving.  Eat more home-cooked food and less restaurant, buffet, and fast food.  When eating at a restaurant, ask that your food be prepared with less salt or none, if possible.  HOW DO I READ FOOD LABELS FOR SODIUM INFORMATION? The Nutrition Facts label lists the amount of sodium in one serving of the food. If you eat more than one serving, you must multiply the listed amount of sodium by the number of servings. Food labels may also identify foods as:  Sodium free--Less than 5 mg in a serving.  Very low sodium--35 mg or less in a serving.  Low sodium--140 mg or less in a serving.  Light in sodium--50% less sodium in a serving. For example, if a food that usually has 300 mg of sodium is changed to become light in sodium, it will have 150 mg of sodium.  Reduced sodium--25% less sodium in a serving. For example, if a food that usually has 400 mg of sodium is changed to reduced sodium, it will have 300 mg of sodium. WHAT FOODS CAN I EAT? Grains Low-sodium cereals, including oats, puffed wheat and rice, and shredded wheat cereals. Low-sodium crackers. Unsalted rice and pasta. Lower-sodium bread.  Vegetables Frozen or fresh vegetables. Low-sodium or reduced-sodium canned vegetables. Low-sodium or reduced-sodium tomato sauce and paste. Low-sodium or reduced-sodium tomato and vegetable juices.  Fruits Fresh,  frozen, and canned fruit. Fruit juice.  Meat and Other Protein Products Low-sodium canned tuna and salmon. Fresh or frozen meat, poultry, seafood, and fish. Lamb. Unsalted nuts. Dried beans, peas, and lentils without added salt. Unsalted canned beans. Homemade soups without salt. Eggs.  Dairy Milk. Soy milk. Ricotta cheese. Low-sodium or reduced-sodium cheeses. Yogurt.  Condiments Fresh and dried herbs and spices. Salt-free seasonings. Onion and garlic powders. Low-sodium varieties of mustard and  ketchup. Lemon juice.  Fats and Oils Reduced-sodium salad dressings. Unsalted butter.  Other Unsalted popcorn and pretzels.  The items listed above may not be a complete list of recommended foods or beverages. Contact your dietitian for more options. WHAT FOODS ARE NOT RECOMMENDED? Grains Instant hot cereals. Bread stuffing, pancake, and biscuit mixes. Croutons. Seasoned rice or pasta mixes. Noodle soup cups. Boxed or frozen macaroni and cheese. Self-rising flour. Regular salted crackers. Vegetables Regular canned vegetables. Regular canned tomato sauce and paste. Regular tomato and vegetable juices. Frozen vegetables in sauces. Salted french fries. Olives. Angie Fava. Relishes. Sauerkraut. Salsa. Meat and Other Protein Products Salted, canned, smoked, spiced, or pickled meats, seafood, or fish. Bacon, ham, sausage, hot dogs, corned beef, chipped beef, and packaged luncheon meats. Salt pork. Jerky. Pickled herring. Anchovies, regular canned tuna, and sardines. Salted nuts. Dairy Processed cheese and cheese spreads. Cheese curds. Blue cheese and cottage cheese. Buttermilk.  Condiments Onion and garlic salt, seasoned salt, table salt, and sea salt. Canned and packaged gravies. Worcestershire sauce. Tartar sauce. Barbecue sauce. Teriyaki sauce. Soy sauce, including reduced sodium. Steak sauce. Fish sauce. Oyster sauce. Cocktail sauce. Horseradish. Regular ketchup and mustard. Meat flavorings and tenderizers. Bouillon cubes. Hot sauce. Tabasco sauce. Marinades. Taco seasonings. Relishes. Fats and Oils Regular salad dressings. Salted butter. Margarine. Ghee. Bacon fat.  Other Potato and tortilla chips. Corn chips and puffs. Salted popcorn and pretzels. Canned or dried soups. Pizza. Frozen entrees and pot pies.  The items listed above may not be a complete list of foods and beverages to avoid. Contact your dietitian for more information. Document Released: 03/06/2002 Document Revised:  09/19/2013 Document Reviewed: 07/19/2013 [facility] Patient Information 2015 [place], [place]. This information is not intended to replace advice given to you by your health care provider. Make sure you discuss any questions you have with your health care provider.

## 2014-09-12 NOTE — Telephone Encounter (Signed)
Spoke with Martha Mcbride, script sent to the pharmacy

## 2014-09-13 ENCOUNTER — Ambulatory Visit (INDEPENDENT_AMBULATORY_CARE_PROVIDER_SITE_OTHER)
Admission: RE | Admit: 2014-09-13 | Discharge: 2014-09-13 | Disposition: A | Discharge status: Home / Self Care | Payer: Medicare Other | Source: Ambulatory Visit | Attending: Vascular Surgery | Admitting: Vascular Surgery

## 2014-09-13 ENCOUNTER — Ambulatory Visit (INDEPENDENT_AMBULATORY_CARE_PROVIDER_SITE_OTHER): Payer: Medicare Other | Admitting: Vascular Surgery

## 2014-09-13 ENCOUNTER — Ambulatory Visit (HOSPITAL_COMMUNITY)
Admission: RE | Admit: 2014-09-13 | Discharge: 2014-09-13 | Disposition: A | Discharge status: Home / Self Care | Payer: Medicare Other | Source: Ambulatory Visit | Attending: Vascular Surgery | Admitting: Vascular Surgery

## 2014-09-13 ENCOUNTER — Encounter: Payer: Self-pay | Admitting: Vascular Surgery

## 2014-09-13 VITALS — BP 120/59 | HR 75 | Resp 16 | Ht 60.0 in | Wt 137.6 lb

## 2014-09-13 DIAGNOSIS — I739 Peripheral vascular disease, unspecified: Secondary | ICD-10-CM | POA: Insufficient documentation

## 2014-09-13 DIAGNOSIS — I96 Gangrene, not elsewhere classified: Secondary | ICD-10-CM

## 2014-09-13 NOTE — Progress Notes (Signed)
HISTORY AND PHYSICAL     CC:  2 black toes; tests scheduled Referring Provider:  Gaynelle Arabian, MD  HPI: This is a 80 y.o. female who presented for evaluation of gangrene toes in October.  She was having intermittent fevers at home.  These progressively worsened.  She was admitted with fever and cardiac murmur and was septic possibly from PNA.  Her ABx were adjusted appropriately.  Vascular surgery was consulted for her gangrenous toes, which was not the origin of her sepsis.  She was subsequently discharged from the hospital and is following up with [facility] today to evaluate her arterial flow.  She states that she is not having any pain in her toes.  She has not had any drainage from them.  She does have an area on the left 2nd and 3rd toe that appears bruised from possibly bumping it.  She continues to have swelling in both legs.  Past Medical History  Diagnosis Date  . Arthritis   . Polymyalgia   . Acid reflux   . Immune deficiency disorder     autoimmune disorder - polymyalgia - on prednisone  . Breast cancer     Treated with lumpectomy and radiation  . Giardia   . Pinworms   . History of hiatal hernia   . Pneumonia   . Dementia   . CHF (congestive heart failure)   . Dysrhythmia     Past Surgical History  Procedure Laterality Date  . Breast lumpectomy    . Partial hysterectomy    . Tonsillectomy    . Tee without cardioversion N/A 07/20/2014    Procedure: TRANSESOPHAGEAL ECHOCARDIOGRAM (TEE);  Surgeon: Candee Furbish, MD;  Location: [facility];  Service: Cardiovascular;  Laterality: N/A;  . Abdominal hysterectomy      Allergies  Allergen Reactions  . Doxycycline Nausea And Vomiting  . Other Nausea And Vomiting and Other (See Comments)    Anesthesia makes her very sleepy and makes it hard for her to come out of it.   Marland Kitchen Penicillins Hives and Nausea And Vomiting  . Sulfa Antibiotics Nausea And Vomiting    Current Outpatient Prescriptions  Medication Sig Dispense Refill   . aspirin 81 MG chewable tablet Place 1 tablet (81 mg total) into feeding tube daily.    . folic acid (FOLVITE) 1 MG tablet Place 1 tablet (1 mg total) into feeding tube daily.    . furosemide (LASIX) 10 MG/ML solution Place 4 mLs (40 mg total) into feeding tube daily. 120 mL 6  . hydroxypropyl methylcellulose / hypromellose (ISOPTO TEARS / GONIOVISC) 2.5 % ophthalmic solution Place 1 drop into both eyes every morning.    . loperamide (IMODIUM) 1 MG/5ML solution Place 10 mLs (2 mg total) into feeding tube 2 (two) times daily. May take 1 additional  dose or decrease as needed 120 mL 1  . metoprolol tartrate (LOPRESSOR) 25 mg/10 mL SUSP Place 2.5 mLs (6.25 mg total) into feeding tube 2 (two) times daily. 30 mL 12  . Nutritional Supplements (FEEDING SUPPLEMENT, JEVITY 1.2 CAL,) LIQD Place 237 mLs into feeding tube 5 (five) times daily. 237 mL 0  . potassium chloride 20 MEQ/15ML (10%) SOLN Place 15 mLs (20 mEq total) into feeding tube daily. 450 mL 0  . predniSONE 5 MG/5ML solution Place 40 mLs (40 mg total) into feeding tube daily. 500 mL 2  . ranitidine (ZANTAC) 75 MG/5ML syrup Place 10 mLs (150 mg total) into feeding tube at bedtime. 180 mL 1  .  saccharomyces boulardii (FLORASTOR) 250 MG capsule Take 1 capsule (250 mg total) by mouth 2 (two) times daily. (Patient taking differently: 250 mg by Feeding Tube route 2 (two) times daily. ) 60 capsule 0  . Water For Irrigation, Sterile (FREE WATER) SOLN Place 120 mLs into feeding tube 4 (four) times daily.     No current facility-administered medications for this visit.    Family History  Problem Relation Age of Onset  . Stroke Mother     History   Social History  . Marital Status: Widowed    Spouse Name: N/A    Number of Children: 4  . Years of Education: N/A   Occupational History  . Not on file.   Social History Main Topics  . Smoking status: Never Smoker   . Smokeless tobacco: Never Used  . Alcohol Use: No  . Drug Use: No  . Sexual  Activity: Not on file   Other Topics Concern  . Not on file   Social History Narrative     ROS: [x]  Positive   [ ]  Negative   [ ]  All sytems reviewed and are negative  Cardiovascular: []  chest pain/pressure []  palpitations []  SOB lying flat []  DOE []  pain in legs while walking []  pain in feet when lying flat []  hx of DVT []  hx of phlebitis [x]  swelling in legs [x]  CHF []  varicose veins  Pulmonary: [x]  productive cough []  asthma []  wheezing  Neurologic: []  weakness in []  arms []  legs []  numbness in []  arms []  legs [] difficulty speaking or slurred speech []  temporary loss of vision in one eye []  dizziness  Hematologic: []  bleeding problems []  problems with blood clotting easily  GI []  vomiting blood []  blood in stool  GU: []  burning with urination []  blood in urine  Psychiatric: []  hx of major depression  Integumentary: []  rashes []  ulcers  Constitutional: []  fever []  chills   PHYSICAL EXAMINATION:  Filed Vitals:   09/13/14 1309  BP: 120/59  Pulse: 75  Resp: 16   Body mass index is 26.87 kg/(m^2).  General:  WDWN in NAD Gait: Not observed HENT: WNL, normocephalic Pulmonary: normal non-labored breathing , without Rales, rhonchi,  wheezing Cardiac: regular Skin: without rashes, without ulcers  Vascular Exam/Pulses:  right left  DP 2+ (normal) Absent  PT absent 2+ (normal)   Extremities: with ischemic changes, with Gangrene of right 2nd and 3rd toes, there is slight bruising over the left 2nd and 3rd toes without cellulitis; without open wounds;  Musculoskeletal: no muscle wasting or atrophy  Neurologic: A&O X 3; Appropriate Affect ; SENSATION: normal; MOTOR FUNCTION:  moving all extremities equally. Speech is fluent/normal   Non-Invasive Vascular Imaging:   Patent right SFA without evidence of hemodynamically significant stenosis  ABI's: Right:  1.22 Left:  1.18  Pt meds includes: Statin:  No. Beta Blocker:  Yes.   Aspirin:   Yes.   ACEI:  No. ARB:  No. Other Antiplatelet/Anticoagulant:  No.    ASSESSMENT/PLAN:: 80 y.o. female who was seen in the hospital by Dr. Oneida Alar in October for gangrenous toes   -her ABI's today are normal and she does have palpable right PT and left DP -her toes are dry and gangrenous.  Continue to monitor and they should auto-amputate over time.  She and her daughter are informed that if she should start to have pain or any drainage from these toes, she will f/u up with [place] sooner.  Otherwise, we will see her back  in 6 weeks. -she continues to have BLE and was seen by the cardiologist and her lasix dose was doubled   Leontine Locket, PA-C [facility] [phone]  Clinic MD:  Pt seen and examined in conjunction with Dr. Oneida Alar  History and exam findings as above. Palpable pedal pulses. Normal ABIs. Patient has multiple comorbidities. She has adequate circulation to her right lower extremity in her large vessels. She does have dry gangrenous changes of 2 toes on the right foot. These continue to remain dry with no evidence of infection. She has no pain from these. I believe the best option would be to allow these to auto amputate rather than putting this patient through an operation due to her multiple comorbidities. She will follow-up with [place] in 6 weeks' time to recheck her right foot. She and her daughter were counseled today that if she has any drainage or increasing pain from the toes to return sooner. She will wear a dry protective dressing on her right foot.  Ruta Hinds, MD [facility] [place] Office: [phone] Pager: [phone]

## 2014-09-14 ENCOUNTER — Telehealth: Payer: Self-pay | Admitting: Pulmonary Disease

## 2014-09-14 ENCOUNTER — Ambulatory Visit
Admission: RE | Admit: 2014-09-14 | Discharge: 2014-09-14 | Disposition: A | Discharge status: Home / Self Care | Payer: Medicare Other | Source: Ambulatory Visit | Attending: Internal Medicine | Admitting: Internal Medicine

## 2014-09-14 ENCOUNTER — Other Ambulatory Visit: Payer: Self-pay | Admitting: Internal Medicine

## 2014-09-14 DIAGNOSIS — R509 Fever, unspecified: Secondary | ICD-10-CM

## 2014-09-14 NOTE — Telephone Encounter (Signed)
lmtcb X1 

## 2014-09-14 NOTE — Telephone Encounter (Signed)
Spoke with pt's daughter, states that pt has an appt scheduled for today at 2:45 with per pcp for these symptoms.  Nothing further needed at this time, will call us if anything further is needed.

## 2014-09-20 LAB — BASIC METABOLIC PANEL
BUN: 18 mg/dL (ref 6–23)
CALCIUM: 8 mg/dL — AB (ref 8.4–10.5)
CO2: 25 mEq/L (ref 19–32)
Chloride: 99 mEq/L (ref 96–112)
Creat: 0.55 mg/dL (ref 0.50–1.10)
GLUCOSE: 247 mg/dL — AB (ref 70–99)
POTASSIUM: 4.4 meq/L (ref 3.5–5.3)
Sodium: 133 mEq/L — ABNORMAL LOW (ref 135–145)

## 2014-09-26 ENCOUNTER — Other Ambulatory Visit: Payer: Self-pay | Admitting: Adult Health

## 2014-09-26 ENCOUNTER — Ambulatory Visit (INDEPENDENT_AMBULATORY_CARE_PROVIDER_SITE_OTHER)
Admission: RE | Admit: 2014-09-26 | Discharge: 2014-09-26 | Disposition: A | Discharge status: Home / Self Care | Payer: Medicare Other | Source: Ambulatory Visit | Attending: Adult Health | Admitting: Adult Health

## 2014-09-26 ENCOUNTER — Encounter: Payer: Self-pay | Admitting: Adult Health

## 2014-09-26 ENCOUNTER — Ambulatory Visit (INDEPENDENT_AMBULATORY_CARE_PROVIDER_SITE_OTHER): Payer: Medicare Other | Admitting: Adult Health

## 2014-09-26 VITALS — BP 112/74 | HR 90 | Temp 98.8°F | Ht 60.0 in | Wt 129.0 lb

## 2014-09-26 DIAGNOSIS — I5032 Chronic diastolic (congestive) heart failure: Secondary | ICD-10-CM

## 2014-09-26 DIAGNOSIS — J189 Pneumonia, unspecified organism: Secondary | ICD-10-CM

## 2014-09-26 NOTE — Patient Instructions (Addendum)
Continue on current regimen  Follow with Rheumatology next week as planned  Follow up Dr. Elsworth Soho  In 2 months and As needed

## 2014-09-26 NOTE — Assessment & Plan Note (Signed)
Recent admission for recurrent aspiration PNA  Doing well, very frail ,  Cont w/ OT/PT and speech therapy  CXR is improved  Cont with PEG /tube feeds. (NPO)  follow up Dr. Elsworth Soho  In 2 months and As needed

## 2014-09-26 NOTE — Assessment & Plan Note (Signed)
Compensated on present regimen  Cont Lasix 40mg  daily

## 2014-09-26 NOTE — Progress Notes (Signed)
Subjective:    Patient ID: Martha Mcbride, female    DOB: 02-21-1925, 80 y.o.   MRN: [id number]  HPI  80 y.o. female for follow-up of interstitial lung disease presumed IPF PMH significant for Polymyalgia on prednisone, severely calcified mitral valve with moderate stenosis, chronic diastolic heart failure,Gangrenous toes on the right foot    Significant tests/ events   admission 10-27/15 -health care associated PNA, encephalopathy, and diastolic HF exacerbation.   Admitted 07/28/14 with fever 103, confusion,productive cough. WBC at 12, Lactic acid at 3.5. Chest x ray -diffuse bilateral pulmonary interstitial infiltrates.& eosinophilia (AEC 1800 on 10/22 which came down to 600 on 10/31). Diarrhea is resolved & felt unlikely to be parasitic by ID - stool O & P neg.   07/30/14  CT: Evidence of emphysema with traction bronchiectasis in multiple areas of interstitial fibrosis. This appearance is consistent with underlying usual interstitial pneumonitis. There are areas of patchy consolidation in both lower lobes as wellas to a lesser extent in the upper lobes. There also moderate pleural effusions. The areas of consolidation are felt to most likely represent superimposed pneumonia. There may be a degree of superimposed congestive heart failure as well given the pleural effusions.5 mm nodular opacity in the posterior segment of the right upper lobe. Areas of atherosclerotic change and cardiac valvular calcification. Areas of relatively mild adenopathy. Cholelithiasis.  04/2012 CT abd >>large hiatal hernia . Prominent interstitial lung markings suggest chronic changes  06/2014 TEE mild MS, mod AS, nml LVfn Ova and parasite stool neg.  Strongyloids antibody pending.  Quantiferon Tb neg    swallowing evaluation was done that showed severe oropharyngeal dysphagia now status post PEG placement with nutrition her dysphagia & memory issues seem to be more important at this time  Accompanied by  daughterJoanne ,she is in a wheelchair.has lost significant weight since admission Breathing is much improved, is undergoing home physical therapy Immunization up-to-date She was discharged on 15 mg of prednisone, is running short on Lasix Is able to use oxygen at night  09/26/2014 [facility] follow up  Patient returns for a post hospital follow-up. She was admitted November 27 through December 9 for recurrent aspiration pneumonia, complicated by severe sepsis and decompensated congestive heart failure. She was treated with aggressive IV antibiotics and diuresis. Since discharge, she is feeling Chest x-ray today shows no overt pulmonary edema and chronic interstitial lung disease changes. Has Speech, OT/PT at home  Does have some low grade fevers since discharge  Seen by PCP , dx w/ UTI, finished abx.  Has Peg tube- bolus feeds .  Following with wound center with sacral wounds.  Has loose stools -getting better.  Moving forward -slowly but forward progress.  Very supportive family .  Has follow up with Rheumatology next week . Currently on Prednisone 40mg  daily for polymyalgia .  She denies any chest pain, hemoptysis, orthopnea, PND or increased leg swelling        Review of Systems neg for any significant sore throat, dysphagia, itching, sneezing, nasal congestion or excess/ purulent secretions, fever, chills, sweats, unintended wt loss, pleuritic or exertional cp, hempoptysis, orthopnea pnd or change in chronic leg swelling. Also denies presyncope, palpitations, heartburn, abdominal pain, nausea, vomiting, diarrhea or change in bowel or urinary habits, dysuria,hematuria, rash, arthralgias, visual complaints, headache, numbness weakness or ataxia.     Objective:   Physical Exam  Gen. Pleasant, frail , elderly in wheelchair  ENT - no lesions, no post nasal drip Neck: No JVD,  no thyromegaly, no carotid bruits Lungs: no use of accessory muscles, no dullness to percussion, few  bibasilar crackles  Cardiovascular: Rhythm regular,  2/6 SM , tr  peripheral edema Musculoskeletal: No deformities, no cyanosis or clubbing        Assessment & Plan:

## 2014-09-27 ENCOUNTER — Encounter (HOSPITAL_BASED_OUTPATIENT_CLINIC_OR_DEPARTMENT_OTHER): Payer: Medicare Other | Attending: General Surgery

## 2014-09-27 ENCOUNTER — Ambulatory Visit: Payer: Medicare Other | Admitting: Cardiology

## 2014-09-27 DIAGNOSIS — L89322 Pressure ulcer of left buttock, stage 2: Secondary | ICD-10-CM | POA: Diagnosis present

## 2014-09-27 DIAGNOSIS — L89152 Pressure ulcer of sacral region, stage 2: Secondary | ICD-10-CM | POA: Diagnosis not present

## 2014-09-27 DIAGNOSIS — L97519 Non-pressure chronic ulcer of other part of right foot with unspecified severity: Secondary | ICD-10-CM | POA: Insufficient documentation

## 2014-09-27 DIAGNOSIS — L89892 Pressure ulcer of other site, stage 2: Secondary | ICD-10-CM | POA: Insufficient documentation

## 2014-09-28 NOTE — Progress Notes (Signed)
[facility]  NAME:  Martha, [name]                ACCOUNT NO.:  [id number]  MEDICAL RECORD NO.:  [id number]      DATE OF BIRTH:  1924/11/29  PHYSICIAN:  Ricard Dillon, M.D.      VISIT DATE:                                  OFFICE VISIT   CHIEF COMPLAINT:  Review of pressure ulcers on her buttocks.  Ms. Martha Mcbride is an 81 year old woman who arrives in the clinic today. She has had a difficult time apparently going back to September of this year.  Prior to this, she was independent patient living at home taking care for self.  She according to her daughter and granddaughter is present has had a series of difficult hospitalizations at [facility]. Starting in September with an admission for chest pain.  Subsequently, she was in hospital from October 22 through October 27 with wounds on her right toes which I think were felt to be cholesterol emboli.  Again, I think at this time she presented with chest pain and fever.  There is reference in the wound care notes to ulcerative lesions on her tongue felt secondary to prednisone use and also with decubitus ulcer. Finally, she was admitted to hospital from October 31 through October 13 with sepsis.  At that point, she had been felt to have recurrent aspiration pneumonia.  She had a PEG tube placed.  Wound care consult from the hospital shows a stage II pressure ulcer to her left buttock and a stage III ulcer over her lower sacrum/coccyx area.  According to the family, they have used a variety of things on this including Santyl now recently they have actually been applying an ointment obtained over the Internet.  On top of all of this, the patient has had problems with copious amounts of diarrhea.  Apparently, she has been on PEG tube feeding for a number of months.  I think it has been changed to an elemental formula which has helped but still she has 6-8 liquid BMs per day.  Worse than this, she has developed a  perianal wound.  I cannot easily see this described anywhere in the hospital records although it is clearly not new per the family, they stated that as soon as she started her PEG tube feedings, she developed erythema which was very painful around her perianal area. At some point, this opened and she has been left with very necrotic perianal wounds which are really quite extensive.  PAST MEDICAL HISTORY:  Reviewed includes, 1. Gastroesophageal reflux disease. 2. Rheumatoid arthritis. 3. Osteoporosis. 4. Polymyalgia, currently on high-dose prednisone 40 mg. 5. Basal cell skin CA. 6. Scleritis. 7. Neuropathy. 8. Some form of chronic interstitial lung disease presumably     idiopathic. 9. Recent gangrene on the toes of her right foot.  I think felt to be     secondary to atheromatous emboli by vascular diastolic heart     failure and neuropathy.  CURRENT MEDICATIONS:  Lasix, Lopressor, potassium, prednisone 40 mg daily, Florastor, Imodium 10 mL b.i.d. all these through her PEG tube.  PHYSICAL EXAMINATION:  GENERAL:  A frail but alert, talkative woman. VITAL SIGNS:  Her temperature is 98.9, pulse 88, respirations 16, blood pressure 142/70. RESPIRATORY:  Clear air entry bilaterally. CARDIOVASCULAR:  She has a 3/6 pansystolic murmur over the PMI and sternal border.  Review of her echocardiogram from July 20, 2014, shows moderate aortic stenosis, mild mitral stenosis although this sounded more like an insufficiency murmur I do not see this easily described on this echo.  Wound exam.  The areas previously described in the wound care notes are easily identified on the lower coccyx/sacral area.  These are stage II area on the left buttock moreover the ischial tuberosity.  Most problematically she has an extensive area of the wound over a large area of her perianal area.  The tissue here is very friable and really very painful.  I applied topical lidocaine to her wounds gave her some  time, I did a very brief rectal exam.  I did not feel any necrotic tissue internally.  IMPRESSION/PLAN:  This is a very complicated patient. 1. I agree that the areas over her coccyx and buttock are pressure     areas probably stage III and stage II, respectively.  We will     attempt to work on the family at home with pressure relief.  We     applied Collagen based dressings to both of these areas covered     with DuoDerm.  We will call in these orders to home health.  We     will see if we can get them a mattress overlay in the home.  We     talked extensively about other modes of pressure relief including     turning, standing on an hourly basis, etc.  The most worrisome area I see here is the perianal area which is necrotic looking wound.  This is not an usual area for pressure ulceration.  The family describes this is coming from continuous diarrhea which ultimately caused erythema and destruction of the skin. I am not exactly sure that I could except this in the absence of significant coxistent infection.  In any case, this area is being continually bathed in liquid diarrhea which was even visible in our clinic.  we applied silver alginate to this area gauze.  I think this patient is going to need some form of fecal diversion.  She is not a good candidate for a colostomy given the high dose prednisone, other issues which might impair healing but I think a rectal tube might be an attempt to try and divert some of the stool from this area.  I found myself wondering about other types of wounds in this area including inflammatory bowel disease, perhaps even a perianal malignancy.  She has been to see Dr. Clarene Essex of GI in the past and I think she is going to need to see him again.  I think she needs a sigmoidoscopy and probably other stool studies as well.  The family tells me that she has had multiple stools for C. diff and I see 2 of these, 1 from November 12 and 1 from November  28 as well.  Stool for parasites from October 23 to November 1.  Nevertheless, I am concerned about the copious amounts of diarrhea all over these wounds.  Until next week, we basically ordered the dressings as above.  We will see about getting a rectal tube in to the home to try and protect this perianal area.  I think she is going to need to see GI for at least a sigmoidoscopy.         ______________________________ Ricard Dillon, M.D.    MGR/MEDQ  D:  09/27/2014  T:  09/28/2014  Job:  [id number]

## 2014-09-30 NOTE — Progress Notes (Signed)
Reviewed & agree with plan  

## 2014-10-01 ENCOUNTER — Ambulatory Visit (INDEPENDENT_AMBULATORY_CARE_PROVIDER_SITE_OTHER): Payer: 59 | Admitting: Physician Assistant

## 2014-10-01 ENCOUNTER — Encounter: Payer: Self-pay | Admitting: Physician Assistant

## 2014-10-01 VITALS — BP 116/74 | HR 92 | Ht 60.0 in | Wt 128.4 lb

## 2014-10-01 DIAGNOSIS — I5032 Chronic diastolic (congestive) heart failure: Secondary | ICD-10-CM

## 2014-10-01 NOTE — Assessment & Plan Note (Signed)
continue current Lasix dose of 40 mg daily.  Continue daily weight monitoring low-sodium diet.   Serum creatinine stable.  Blood pressure controlled.  Follow-up with Dr. Stanford Breed in 3 months

## 2014-10-01 NOTE — Progress Notes (Signed)
Patient ID: Martha Mcbride, female   DOB: October 31, 1924, 81 y.o.   MRN: [id number]    Date:  10/01/2014   ID:  Martha Mcbride, DOB 1925/06/30, MRN [id number]  PCP:  Kandice Hams, MD  Primary Cardiologist:  Stanford Breed     History of Present Illness: Martha Mcbride is a 81 y.o. female with a PMH of interstitial lung disease, presumed IPF, polymyalgia on chronic prednisone, chronic diastolic heart failure and severe dysphagia status post PEG who was admitted on 08/24/14 to [facility] after being seen at an urgent care for cough, fever and worsening weakness. Initial ER chest xray was concerning for RUL infiltrate. She was noted to have a lactic acid of 6 and hypotension that responded to volume resuscitation. This is her third HCAP in the past 6 weeks. She was also noted to have a new malar rash. The patient was admitted to the SDU for observation. She was pan cultured and to date are negative. We had planned on sending her home on 12/7 but she was having fever. We consulted infectious disease due to on-going fever. They did not feel that she was actively infected and recommended no further antibiotics. Additional concerns we addressed were on going aspiration for which we consulted IR to consider GJ tube (due to DG UGI series showing: reflux to the level of the thoracic inlet, and concern for further aspiration) but at this point the track from her current G tube has not matured enough so they advised this be re-considered in mid December. At this point the family is leaning towards just keeping the G tube as they are concerned about diarrhea r/t J tube feeding which is already an issue. She had a TEE on 07/20/2014 which revealed an ejection fraction of 60-65%. There was moderate aortic stenosis. Mitral valve was severely calcified annulus moderately thickened leaflets. No thrombus in the left atrium. No valvular vegetations were noted.  I saw the patient about 3 weeks ago and I checked a basic  metabolic panel that day and a week later both were stable. I continued her Lasix at 40 mg daily. She is actually taking it 20 in the morning and 20 around 1400hrs.  her weight has decreased about 9 pounds since I saw her last.  She does sleeps elevated but is not complaining of any PND or dyspnea. She does have considerable lower extremity edema still. She is due to have another swallow evaluation at the end of the month but for the time being, she is being fed through her PEG tube.  The patient currently denies nausea, vomiting, fever, chest pain, shortness of breath, orthopnea, dizziness, PND, cough, congestion, abdominal pain, hematochezia, melena.  Wt Readings from Last 3 Encounters:  10/01/14 128 lb 6.4 oz (58.242 kg)  09/26/14 129 lb (58.514 kg)  09/13/14 137 lb 9.6 oz (62.415 kg)     Past Medical History  Diagnosis Date  . Arthritis   . Polymyalgia   . Acid reflux   . Immune deficiency disorder     autoimmune disorder - polymyalgia - on prednisone  . Breast cancer     Treated with lumpectomy and radiation  . Giardia   . Pinworms   . History of hiatal hernia   . Pneumonia   . Dementia   . CHF (congestive heart failure)   . Dysrhythmia     Current Outpatient Prescriptions  Medication Sig Dispense Refill  . aspirin 81 MG chewable tablet Place 1 tablet (81 mg  total) into feeding tube daily.    . folic acid (FOLVITE) 1 MG tablet Place 1 tablet (1 mg total) into feeding tube daily.    . furosemide (LASIX) 10 MG/ML solution Place 4 mLs (40 mg total) into feeding tube daily. 120 mL 6  . hydroxypropyl methylcellulose / hypromellose (ISOPTO TEARS / GONIOVISC) 2.5 % ophthalmic solution Place 1 drop into both eyes every morning.    . loperamide (IMODIUM) 1 MG/5ML solution Place 10 mLs (2 mg total) into feeding tube 2 (two) times daily. May take 1 additional  dose or decrease as needed 120 mL 1  . metoprolol tartrate (LOPRESSOR) 25 mg/10 mL SUSP Place 2.5 mLs (6.25 mg total) into  feeding tube 2 (two) times daily. 30 mL 12  . Nutritional Supplements (PERATIVE) LIQD 8oz at 6am, 7oz at 10am, 2pm, 6pm and 10pm via G tube    . potassium chloride 20 MEQ/15ML (10%) SOLN Place 15 mLs (20 mEq total) into feeding tube daily. 450 mL 0  . predniSONE 5 MG/5ML solution Place 40 mLs (40 mg total) into feeding tube daily. 500 mL 2  . ranitidine (ZANTAC) 75 MG/5ML syrup Place 10 mLs (150 mg total) into feeding tube at bedtime. 180 mL 1  . saccharomyces boulardii (FLORASTOR) 250 MG capsule Take 1 capsule (250 mg total) by mouth 2 (two) times daily. (Patient taking differently: 250 mg by Feeding Tube route 2 (two) times daily. ) 60 capsule 0  . Water For Irrigation, Sterile (FREE WATER) SOLN Place 120 mLs into feeding tube 4 (four) times daily.     No current facility-administered medications for this visit.    Allergies:    Allergies  Allergen Reactions  . Doxycycline Nausea And Vomiting  . Other Nausea And Vomiting and Other (See Comments)    Anesthesia makes her very sleepy and makes it hard for her to come out of it.   Marland Kitchen Penicillins Hives and Nausea And Vomiting  . Sulfa Antibiotics Nausea And Vomiting    Social History:  The patient  reports that she has never smoked. She has never used smokeless tobacco. She reports that she does not drink alcohol or use illicit drugs.   Family history:   Family History  Problem Relation Age of Onset  . Stroke Mother     ROS:  Please see the history of present illness.  All other systems reviewed and negative.   PHYSICAL EXAM: VS:  BP 116/74 mmHg  Pulse 92  Ht 5' (1.524 m)  Wt 128 lb 6.4 oz (58.242 kg)  BMI 25.08 kg/m2 Well nourished, well developed, in no acute distress HEENT: Pupils are equal round react to light accommodation extraocular movements are intact.  Neck: no JVDNo cervical lymphadenopathy. Cardiac: Regular rate and rhythm without murmurs rubs or gallops. Lungs: Mild rales right lower lobe. No wheezing or  rhonchi. Abd: soft, nontender, positive bowel sounds all quadrants,  Ext: 2+ lower extremity edema.  2+ radial and dorsalis pedis pulses. Skin: warm and dry Neuro:  Grossly normal  ASSESSMENT AND PLAN:          chronic diastolic CHF (congestive heart failure)      continue current Lasix dose of 40 mg daily.  Continue daily weight monitoring low-sodium diet.   Serum creatinine stable.  Blood pressure controlled.  Follow-up with Dr. Stanford Breed in 3 months    Aortic stenosis    Moderate by recent TEE

## 2014-10-01 NOTE — Patient Instructions (Signed)
Your physician recommends that you schedule a follow-up appointment in: 3 Months with Dr Stanford Breed  Weigh daily Call [phone] if weight climbs more than 3 pounds in a day or 5 pounds in a week. No salt to very little salt in your diet.  No more than 2000 mg in a day. Call if increased shortness of breath or increased swelling.

## 2014-10-04 ENCOUNTER — Encounter (HOSPITAL_BASED_OUTPATIENT_CLINIC_OR_DEPARTMENT_OTHER): Payer: Medicare Other | Attending: Internal Medicine

## 2014-10-10 ENCOUNTER — Other Ambulatory Visit (HOSPITAL_COMMUNITY): Payer: Self-pay | Admitting: Internal Medicine

## 2014-10-10 DIAGNOSIS — R1310 Dysphagia, unspecified: Secondary | ICD-10-CM

## 2014-10-14 ENCOUNTER — Telehealth: Payer: Self-pay | Admitting: Physician Assistant

## 2014-10-14 NOTE — Telephone Encounter (Signed)
Martha Mcbride is a 81 y.o. female  The patient's daughter called today over concerns about the patient's blood pressure. I was given verbal permission by the patient to speak with her daughter. Her pressure today was ranging in the 130s over 90s with a heart rate of 111. She gave her an extra dose of metoprolol. She takes metoprolol 6.25 mg twice a day. Her pressures is now 120/90 with a heart rate of 102. Patient feels somewhat lethargic. Otherwise denies chest pain, palpitations, shortness of breath, dizziness, syncope, fever, nausea, vomiting, diarrhea, cough. Her weights have been stable. I advised the patient's daughter to increase her metoprolol to 12.5 mg twice a day. We will try adding her on to the clinic schedule tomorrow to be seen. She knows to go the emergency room if she has significant symptoms tonight or worsening blood pressure issues.   Signed,  Richardson Dopp, PA-C   10/14/2014 1:39 PM

## 2014-10-15 ENCOUNTER — Ambulatory Visit (INDEPENDENT_AMBULATORY_CARE_PROVIDER_SITE_OTHER): Payer: 59 | Admitting: Cardiology

## 2014-10-15 ENCOUNTER — Encounter: Payer: Self-pay | Admitting: Cardiology

## 2014-10-15 ENCOUNTER — Telehealth: Payer: Self-pay | Admitting: Cardiology

## 2014-10-15 VITALS — BP 110/76 | HR 108 | Ht 60.0 in | Wt 115.1 lb

## 2014-10-15 DIAGNOSIS — R300 Dysuria: Secondary | ICD-10-CM

## 2014-10-15 DIAGNOSIS — R627 Adult failure to thrive: Secondary | ICD-10-CM

## 2014-10-15 DIAGNOSIS — I5032 Chronic diastolic (congestive) heart failure: Secondary | ICD-10-CM

## 2014-10-15 DIAGNOSIS — I350 Nonrheumatic aortic (valve) stenosis: Secondary | ICD-10-CM

## 2014-10-15 LAB — URINALYSIS
BILIRUBIN URINE: NEGATIVE
Glucose, UA: >1000 mg/dL — AB
Ketones, ur: NEGATIVE mg/dL
Nitrite: NEGATIVE
PH: 7 (ref 5.0–8.0)
PROTEIN: NEGATIVE mg/dL
Specific Gravity, Urine: 1.021 (ref 1.005–1.030)
UROBILINOGEN UA: 0.2 mg/dL (ref 0.0–1.0)

## 2014-10-15 NOTE — Telephone Encounter (Signed)
Martha Mcbride is returning the nurse's call  Thanks

## 2014-10-15 NOTE — Assessment & Plan Note (Signed)
Long discussion with patient and daughters today. She complains of severe malaise, fatigue and weakness. She denies fevers, chills, productive cough. There does not appear to be new issues from a cardiac standpoint. However the patient does not appear to be well and certainly could have occult infection as a potential cause of her recent decline. We did send the patient for a urinalysis (there was ongoing discussion to consider admission vs trying to care for her at home; the daughters wanted to consider while pt was having UA obtained). She was to return with her daughters from downstairs lab to discuss admission which I felt was most likely indicated to fully evaluate her clinical status. However her daughter apparently has been very unhappy with her care at [facility] and states her mother would not survive another admission at [facility]. I explained that I could not be sure she was clinically stable to be at home. I explained I would be happy to help in any way possible. After obtaining her urinalysis her daughter contacted our office and stated she was going to take the patient home and follow-up with her primary care. She did not return for further assessment. I tried to contact the patient's daughter Doristine Section at [phone]. I left a message to contact us if we could assist in any way. Approximately 1 hour spent today in discussions with patient's daughters, evaluation, reviewing records and trying to contact daughter after she left.

## 2014-10-15 NOTE — Assessment & Plan Note (Signed)
Moderate almost recent echocardiogram. We will plan follow-up studies in the future if she improves. At present she would not be a candidate for valve replacement even if indicated.

## 2014-10-15 NOTE — Progress Notes (Signed)
HPI: FU hypertension and diastolic CHF; PMH of interstitial lung disease, presumed IPF, polymyalgia on chronic prednisone, chronic diastolic heart failure and severe dysphagia status post PEG. Multiple admissions for HAP and fever recently. Nuclear study September 2015 showed an ejection fraction of 54% and no ischemia. Echocardiogram September 2015 showed vigorous LV function, high left ventricular filling pressures, moderate aortic stenosis, moderate mitral stenosis and mild mitral regurgitation. Moderate left atrial enlargement. She had a TEE on 07/20/2014 which revealed an ejection fraction of 60-65%. There was moderate aortic stenosis. Mitral valve was severely calcified annulus moderately thickened leaflets; mild MS. No thrombus in the left atrium. No valvular vegetations were noted. Patient presented today with her daughters. For the past 3 days she has had progressive weakness and fatigue. There is no dyspnea. No chest pain. She denies fevers or chills. She is very weak in the office today. She denies productive cough. No abdominal pain. She apparently had a decubitus ulcer checked at [facility] 3 days ago. She has declined since then.  Current Outpatient Prescriptions  Medication Sig Dispense Refill  . aspirin 81 MG chewable tablet Place 1 tablet (81 mg total) into feeding tube daily.    . folic acid (FOLVITE) 1 MG tablet Place 1 tablet (1 mg total) into feeding tube daily.    . furosemide (LASIX) 10 MG/ML solution Place 4 mLs (40 mg total) into feeding tube daily. 120 mL 6  . hydroxypropyl methylcellulose / hypromellose (ISOPTO TEARS / GONIOVISC) 2.5 % ophthalmic solution Place 1 drop into both eyes every morning.    . loperamide (IMODIUM) 1 MG/5ML solution Place 10 mLs (2 mg total) into feeding tube 2 (two) times daily. May take 1 additional  dose or decrease as needed 120 mL 1  . metoprolol tartrate (LOPRESSOR) 25 mg/10 mL SUSP Place 2.5 mLs (6.25 mg total) into feeding tube 2  (two) times daily. 30 mL 12  . Nutritional Supplements (PERATIVE) LIQD 8oz at 6am, 7oz at 10am, 2pm, 6pm and 10pm via G tube    . potassium chloride 20 MEQ/15ML (10%) SOLN Place 15 mLs (20 mEq total) into feeding tube daily. 450 mL 0  . predniSONE 5 MG/5ML solution Place 40 mLs (40 mg total) into feeding tube daily. 500 mL 2  . ranitidine (ZANTAC) 75 MG/5ML syrup Place 10 mLs (150 mg total) into feeding tube at bedtime. 180 mL 1  . saccharomyces boulardii (FLORASTOR) 250 MG capsule Take 1 capsule (250 mg total) by mouth 2 (two) times daily. (Patient taking differently: 250 mg by Feeding Tube route 2 (two) times daily. ) 60 capsule 0  . Water For Irrigation, Sterile (FREE WATER) SOLN Place 120 mLs into feeding tube 4 (four) times daily.     No current facility-administered medications for this visit.     Past Medical History  Diagnosis Date  . Arthritis   . Polymyalgia   . Acid reflux   . Immune deficiency disorder     autoimmune disorder - polymyalgia - on prednisone  . Breast cancer     Treated with lumpectomy and radiation  . Giardia   . Pinworms   . History of hiatal hernia   . Pneumonia   . Dementia   . CHF (congestive heart failure)   . Dysrhythmia     Past Surgical History  Procedure Laterality Date  . Breast lumpectomy    . Partial hysterectomy    . Tonsillectomy    . Tee without cardioversion N/A 07/20/2014  Procedure: TRANSESOPHAGEAL ECHOCARDIOGRAM (TEE);  Surgeon: Candee Furbish, MD;  Location: [facility] ENDOSCOPY;  Service: Cardiovascular;  Laterality: N/A;  . Abdominal hysterectomy      History   Social History  . Marital Status: Widowed    Spouse Name: N/A    Number of Children: 4  . Years of Education: N/A   Occupational History  . Not on file.   Social History Main Topics  . Smoking status: Never Smoker   . Smokeless tobacco: Never Used  . Alcohol Use: No  . Drug Use: No  . Sexual Activity: Not on file   Other Topics Concern  . Not on file   Social  History Narrative    ROS: fatigue, chronic pedal edema, weakness, peg in place; no fevers or chills, productive cough, hemoptysis, melena, hematochezia, dysuria, hematuria, rash, seizure activity, claudication. Remaining systems are negative.  Physical Exam: Well-developed frai, chronically ill appearing Skin is warm and dry. Decubitus ulcer not examined HEENT is normal.  Neck is supple.  Chest is clear to auscultation with normal expansion.  Cardiovascular exam is regular rate and rhythm. 3/6 systolic murmur Abdominal exam No masses palpated. Extremities show 1+ ankle edema. neuro responds to questions appropriately  ECG sinus tachycardia at a rate of 108. Left axis deviation, left ventricular hypertrophy, anterior infarct.

## 2014-10-15 NOTE — Telephone Encounter (Signed)
Received a call from [place] with [employer].Advised patient needs a U/A.

## 2014-10-15 NOTE — Telephone Encounter (Signed)
Returned call to patient's daughter she stated mother had a U/A this morning at [facility] lab.Stated she wanted to put a rush on results.Spoke to [place] she stated she already ordered U/A stat.Advised stat takes 4 hours.Advised Hilda Blades will call back with results.

## 2014-10-15 NOTE — Assessment & Plan Note (Signed)
Patient's volume status is at baseline. This is per her daughters. She is not complaining of dyspnea. Continue present dose of Lasix.

## 2014-10-16 ENCOUNTER — Inpatient Hospital Stay (HOSPITAL_COMMUNITY)
Admission: EM | Admit: 2014-10-16 | Discharge: 2014-10-26 | DRG: 853 | Disposition: A | Discharge status: Skilled Nursing Facility | Payer: Medicare Other | Attending: Internal Medicine | Admitting: Internal Medicine

## 2014-10-16 ENCOUNTER — Encounter (HOSPITAL_COMMUNITY): Payer: Self-pay | Admitting: Emergency Medicine

## 2014-10-16 ENCOUNTER — Inpatient Hospital Stay (HOSPITAL_COMMUNITY): Payer: Medicare Other

## 2014-10-16 ENCOUNTER — Emergency Department (HOSPITAL_COMMUNITY): Payer: Medicare Other

## 2014-10-16 DIAGNOSIS — M353 Polymyalgia rheumatica: Secondary | ICD-10-CM | POA: Diagnosis present

## 2014-10-16 DIAGNOSIS — Z881 Allergy status to other antibiotic agents status: Secondary | ICD-10-CM

## 2014-10-16 DIAGNOSIS — F0390 Unspecified dementia without behavioral disturbance: Secondary | ICD-10-CM | POA: Diagnosis present

## 2014-10-16 DIAGNOSIS — Z7401 Bed confinement status: Secondary | ICD-10-CM

## 2014-10-16 DIAGNOSIS — E0800 Diabetes mellitus due to underlying condition with hyperosmolarity without nonketotic hyperglycemic-hyperosmolar coma (NKHHC): Secondary | ICD-10-CM

## 2014-10-16 DIAGNOSIS — A419 Sepsis, unspecified organism: Secondary | ICD-10-CM | POA: Diagnosis present

## 2014-10-16 DIAGNOSIS — N179 Acute kidney failure, unspecified: Secondary | ICD-10-CM | POA: Diagnosis present

## 2014-10-16 DIAGNOSIS — R6520 Severe sepsis without septic shock: Secondary | ICD-10-CM | POA: Diagnosis present

## 2014-10-16 DIAGNOSIS — E876 Hypokalemia: Secondary | ICD-10-CM | POA: Diagnosis present

## 2014-10-16 DIAGNOSIS — R509 Fever, unspecified: Secondary | ICD-10-CM | POA: Diagnosis present

## 2014-10-16 DIAGNOSIS — Z79899 Other long term (current) drug therapy: Secondary | ICD-10-CM | POA: Diagnosis not present

## 2014-10-16 DIAGNOSIS — Z7982 Long term (current) use of aspirin: Secondary | ICD-10-CM

## 2014-10-16 DIAGNOSIS — R1310 Dysphagia, unspecified: Secondary | ICD-10-CM | POA: Diagnosis present

## 2014-10-16 DIAGNOSIS — I10 Essential (primary) hypertension: Secondary | ICD-10-CM | POA: Diagnosis present

## 2014-10-16 DIAGNOSIS — E860 Dehydration: Secondary | ICD-10-CM

## 2014-10-16 DIAGNOSIS — R4702 Dysphasia: Secondary | ICD-10-CM | POA: Diagnosis present

## 2014-10-16 DIAGNOSIS — Z7901 Long term (current) use of anticoagulants: Secondary | ICD-10-CM

## 2014-10-16 DIAGNOSIS — K219 Gastro-esophageal reflux disease without esophagitis: Secondary | ICD-10-CM | POA: Diagnosis present

## 2014-10-16 DIAGNOSIS — I080 Rheumatic disorders of both mitral and aortic valves: Secondary | ICD-10-CM | POA: Diagnosis present

## 2014-10-16 DIAGNOSIS — K449 Diaphragmatic hernia without obstruction or gangrene: Secondary | ICD-10-CM | POA: Diagnosis present

## 2014-10-16 DIAGNOSIS — I82403 Acute embolism and thrombosis of unspecified deep veins of lower extremity, bilateral: Secondary | ICD-10-CM | POA: Diagnosis present

## 2014-10-16 DIAGNOSIS — S31000A Unspecified open wound of lower back and pelvis without penetration into retroperitoneum, initial encounter: Secondary | ICD-10-CM

## 2014-10-16 DIAGNOSIS — E875 Hyperkalemia: Secondary | ICD-10-CM | POA: Diagnosis present

## 2014-10-16 DIAGNOSIS — B370 Candidal stomatitis: Secondary | ICD-10-CM | POA: Diagnosis present

## 2014-10-16 DIAGNOSIS — E1152 Type 2 diabetes mellitus with diabetic peripheral angiopathy with gangrene: Secondary | ICD-10-CM | POA: Diagnosis present

## 2014-10-16 DIAGNOSIS — I5032 Chronic diastolic (congestive) heart failure: Secondary | ICD-10-CM | POA: Diagnosis present

## 2014-10-16 DIAGNOSIS — B379 Candidiasis, unspecified: Secondary | ICD-10-CM | POA: Diagnosis not present

## 2014-10-16 DIAGNOSIS — Z7952 Long term (current) use of systemic steroids: Secondary | ICD-10-CM

## 2014-10-16 DIAGNOSIS — Z853 Personal history of malignant neoplasm of breast: Secondary | ICD-10-CM | POA: Diagnosis not present

## 2014-10-16 DIAGNOSIS — L8990 Pressure ulcer of unspecified site, unspecified stage: Secondary | ICD-10-CM

## 2014-10-16 DIAGNOSIS — N390 Urinary tract infection, site not specified: Secondary | ICD-10-CM

## 2014-10-16 DIAGNOSIS — Z931 Gastrostomy status: Secondary | ICD-10-CM | POA: Diagnosis not present

## 2014-10-16 DIAGNOSIS — J690 Pneumonitis due to inhalation of food and vomit: Secondary | ICD-10-CM | POA: Diagnosis present

## 2014-10-16 DIAGNOSIS — L89153 Pressure ulcer of sacral region, stage 3: Secondary | ICD-10-CM | POA: Diagnosis present

## 2014-10-16 DIAGNOSIS — E43 Unspecified severe protein-calorie malnutrition: Secondary | ICD-10-CM | POA: Insufficient documentation

## 2014-10-16 DIAGNOSIS — Z823 Family history of stroke: Secondary | ICD-10-CM | POA: Diagnosis not present

## 2014-10-16 DIAGNOSIS — Y95 Nosocomial condition: Secondary | ICD-10-CM | POA: Diagnosis present

## 2014-10-16 DIAGNOSIS — E11649 Type 2 diabetes mellitus with hypoglycemia without coma: Secondary | ICD-10-CM | POA: Diagnosis present

## 2014-10-16 DIAGNOSIS — D849 Immunodeficiency, unspecified: Secondary | ICD-10-CM | POA: Diagnosis present

## 2014-10-16 DIAGNOSIS — D6959 Other secondary thrombocytopenia: Secondary | ICD-10-CM | POA: Diagnosis present

## 2014-10-16 DIAGNOSIS — L0390 Cellulitis, unspecified: Secondary | ICD-10-CM | POA: Diagnosis present

## 2014-10-16 DIAGNOSIS — Z6821 Body mass index (BMI) 21.0-21.9, adult: Secondary | ICD-10-CM

## 2014-10-16 DIAGNOSIS — Z880 Allergy status to penicillin: Secondary | ICD-10-CM

## 2014-10-16 DIAGNOSIS — M1990 Unspecified osteoarthritis, unspecified site: Secondary | ICD-10-CM | POA: Diagnosis present

## 2014-10-16 DIAGNOSIS — K529 Noninfective gastroenteritis and colitis, unspecified: Secondary | ICD-10-CM | POA: Diagnosis present

## 2014-10-16 DIAGNOSIS — Z794 Long term (current) use of insulin: Secondary | ICD-10-CM | POA: Diagnosis not present

## 2014-10-16 DIAGNOSIS — G9340 Encephalopathy, unspecified: Secondary | ICD-10-CM | POA: Diagnosis present

## 2014-10-16 DIAGNOSIS — E119 Type 2 diabetes mellitus without complications: Secondary | ICD-10-CM

## 2014-10-16 DIAGNOSIS — I82409 Acute embolism and thrombosis of unspecified deep veins of unspecified lower extremity: Secondary | ICD-10-CM | POA: Insufficient documentation

## 2014-10-16 DIAGNOSIS — R739 Hyperglycemia, unspecified: Secondary | ICD-10-CM

## 2014-10-16 DIAGNOSIS — E878 Other disorders of electrolyte and fluid balance, not elsewhere classified: Secondary | ICD-10-CM | POA: Diagnosis present

## 2014-10-16 DIAGNOSIS — E1100 Type 2 diabetes mellitus with hyperosmolarity without nonketotic hyperglycemic-hyperosmolar coma (NKHHC): Secondary | ICD-10-CM | POA: Diagnosis present

## 2014-10-16 DIAGNOSIS — O2230 Deep phlebothrombosis in pregnancy, unspecified trimester: Secondary | ICD-10-CM

## 2014-10-16 DIAGNOSIS — Z792 Long term (current) use of antibiotics: Secondary | ICD-10-CM | POA: Diagnosis not present

## 2014-10-16 LAB — CBC WITH DIFFERENTIAL/PLATELET
Basophils Absolute: 0 10*3/uL (ref 0.0–0.1)
Basophils Relative: 0 % (ref 0–1)
EOS PCT: 0 % (ref 0–5)
Eosinophils Absolute: 0 10*3/uL (ref 0.0–0.7)
HCT: 50.6 % — ABNORMAL HIGH (ref 36.0–46.0)
HEMOGLOBIN: 15.5 g/dL — AB (ref 12.0–15.0)
LYMPHS PCT: 32 % (ref 12–46)
Lymphs Abs: 2.8 10*3/uL (ref 0.7–4.0)
MCH: 32.3 pg (ref 26.0–34.0)
MCHC: 30.6 g/dL (ref 30.0–36.0)
MCV: 105.4 fL — ABNORMAL HIGH (ref 78.0–100.0)
MONOS PCT: 8 % (ref 3–12)
Monocytes Absolute: 0.7 10*3/uL (ref 0.1–1.0)
Neutro Abs: 5.4 10*3/uL (ref 1.7–7.7)
Neutrophils Relative %: 60 % (ref 43–77)
Platelets: 137 10*3/uL — ABNORMAL LOW (ref 150–400)
RBC: 4.8 MIL/uL (ref 3.87–5.11)
RDW: 16.2 % — ABNORMAL HIGH (ref 11.5–15.5)
WBC: 8.9 10*3/uL (ref 4.0–10.5)

## 2014-10-16 LAB — URINALYSIS, ROUTINE W REFLEX MICROSCOPIC
BILIRUBIN URINE: NEGATIVE
KETONES UR: NEGATIVE mg/dL
Leukocytes, UA: NEGATIVE
Nitrite: NEGATIVE
PH: 5 (ref 5.0–8.0)
Protein, ur: NEGATIVE mg/dL
SPECIFIC GRAVITY, URINE: 1.033 — AB (ref 1.005–1.030)
UROBILINOGEN UA: 1 mg/dL (ref 0.0–1.0)

## 2014-10-16 LAB — URINE MICROSCOPIC-ADD ON

## 2014-10-16 LAB — MRSA PCR SCREENING: MRSA BY PCR: NEGATIVE

## 2014-10-16 LAB — BRAIN NATRIURETIC PEPTIDE: B NATRIURETIC PEPTIDE 5: 112.2 pg/mL — AB (ref 0.0–100.0)

## 2014-10-16 LAB — I-STAT CHEM 8, ED
BUN: 49 mg/dL — AB (ref 6–23)
CHLORIDE: 116 meq/L — AB (ref 96–112)
Calcium, Ion: 1.16 mmol/L (ref 1.13–1.30)
Creatinine, Ser: 0.9 mg/dL (ref 0.50–1.10)
HEMATOCRIT: 50 % — AB (ref 36.0–46.0)
Hemoglobin: 17 g/dL — ABNORMAL HIGH (ref 12.0–15.0)
Potassium: 4 mmol/L (ref 3.5–5.1)
Sodium: 154 mmol/L — ABNORMAL HIGH (ref 135–145)
TCO2: 21 mmol/L (ref 0–100)

## 2014-10-16 LAB — TROPONIN I: Troponin I: 0.03 ng/mL (ref <0.031)

## 2014-10-16 LAB — COMPREHENSIVE METABOLIC PANEL
ALK PHOS: 127 U/L — AB (ref 39–117)
ALT: 22 U/L (ref 0–35)
AST: 36 U/L (ref 0–37)
Albumin: 2.9 g/dL — ABNORMAL LOW (ref 3.5–5.2)
Anion gap: 13 (ref 5–15)
BUN: 54 mg/dL — ABNORMAL HIGH (ref 6–23)
CO2: 23 mmol/L (ref 19–32)
Calcium: 8.9 mg/dL (ref 8.4–10.5)
Chloride: 115 mEq/L — ABNORMAL HIGH (ref 96–112)
Creatinine, Ser: 1.17 mg/dL — ABNORMAL HIGH (ref 0.50–1.10)
GFR calc non Af Amer: 41 mL/min — ABNORMAL LOW (ref >90)
GFR, EST AFRICAN AMERICAN: 47 mL/min — AB (ref >90)
Glucose, Bld: 735 mg/dL (ref 70–99)
POTASSIUM: 4.8 mmol/L (ref 3.5–5.1)
Sodium: 151 mmol/L — ABNORMAL HIGH (ref 135–145)
Total Bilirubin: 1.1 mg/dL (ref 0.3–1.2)
Total Protein: 6.1 g/dL (ref 6.0–8.3)

## 2014-10-16 LAB — CBG MONITORING, ED
Glucose-Capillary: 592 mg/dL (ref 70–99)
Glucose-Capillary: 396 mg/dL — ABNORMAL HIGH (ref 70–99)

## 2014-10-16 LAB — HEMOGLOBIN A1C
HEMOGLOBIN A1C: 8.2 % — AB (ref <5.7)
MEAN PLASMA GLUCOSE: 189 mg/dL — AB (ref <117)

## 2014-10-16 LAB — LACTIC ACID, PLASMA: Lactic Acid, Venous: 5.2 mmol/L — ABNORMAL HIGH (ref 0.5–2.2)

## 2014-10-16 LAB — I-STAT CG4 LACTIC ACID, ED
LACTIC ACID, VENOUS: 6.69 mmol/L — AB (ref 0.5–2.2)
Lactic Acid, Venous: 5.35 mmol/L — ABNORMAL HIGH (ref 0.5–2.2)

## 2014-10-16 LAB — KETONES, QUALITATIVE: Acetone, Bld: NEGATIVE

## 2014-10-16 LAB — GLUCOSE, CAPILLARY
GLUCOSE-CAPILLARY: 188 mg/dL — AB (ref 70–99)
GLUCOSE-CAPILLARY: 186 mg/dL — AB (ref 70–99)

## 2014-10-16 MED ORDER — SODIUM CHLORIDE 0.9 % IV SOLN: INTRAVENOUS | Status: DC

## 2014-10-16 MED ORDER — SODIUM CHLORIDE 0.9 % IV SOLN
INTRAVENOUS | Status: DC
  Administered 2014-10-16: 3.4 [IU]/h via INTRAVENOUS
  Filled 2014-10-16: qty 2.5

## 2014-10-16 MED ORDER — LIDOCAINE HCL 2 % EX GEL
1.0000 "application " | Freq: Every day | CUTANEOUS | Status: DC
  Administered 2014-10-17 – 2014-10-26 (×8): 1 via TOPICAL
  Filled 2014-10-16 (×7): qty 5

## 2014-10-16 MED ORDER — HYPROMELLOSE (GONIOSCOPIC) 2.5 % OP SOLN: 1.0000 [drp] | Freq: Every day | OPHTHALMIC | Status: DC | PRN

## 2014-10-16 MED ORDER — DEXTROSE 5 % IV SOLN
2.0000 g | Freq: Once | INTRAVENOUS | Status: AC
  Administered 2014-10-16: 2 g via INTRAVENOUS
  Filled 2014-10-16: qty 2

## 2014-10-16 MED ORDER — VANCOMYCIN HCL IN DEXTROSE 1-5 GM/200ML-% IV SOLN
1000.0000 mg | Freq: Once | INTRAVENOUS | Status: AC
  Administered 2014-10-16: 1000 mg via INTRAVENOUS
  Filled 2014-10-16: qty 200

## 2014-10-16 MED ORDER — DEXTROSE-NACL 5-0.45 % IV SOLN
INTRAVENOUS | Status: DC
  Administered 2014-10-16 – 2014-10-18 (×3): via INTRAVENOUS
  Administered 2014-10-18: 1000 mL via INTRAVENOUS

## 2014-10-16 MED ORDER — ACETAMINOPHEN 325 MG PO TABS
650.0000 mg | ORAL_TABLET | Freq: Once | ORAL | Status: AC
  Administered 2014-10-16: 650 mg via ORAL
  Filled 2014-10-16: qty 2

## 2014-10-16 MED ORDER — SODIUM CHLORIDE 0.9 % IV SOLN
INTRAVENOUS | Status: DC
  Administered 2014-10-16: 7 [IU]/h via INTRAVENOUS
  Filled 2014-10-16: qty 2.5

## 2014-10-16 MED ORDER — ASPIRIN 81 MG PO CHEW: 324.0000 mg | CHEWABLE_TABLET | ORAL | Status: AC

## 2014-10-16 MED ORDER — ASPIRIN 300 MG RE SUPP: 300.0000 mg | RECTAL | Status: AC

## 2014-10-16 MED ORDER — SODIUM CHLORIDE 0.9 % IV SOLN
INTRAVENOUS | Status: DC
  Administered 2014-10-16 – 2014-10-17 (×2): via INTRAVENOUS

## 2014-10-16 MED ORDER — VANCOMYCIN HCL IN DEXTROSE 750-5 MG/150ML-% IV SOLN
750.0000 mg | INTRAVENOUS | Status: DC
Start: 2014-10-17 — End: 2014-10-19
  Administered 2014-10-17 – 2014-10-19 (×3): 750 mg via INTRAVENOUS
  Filled 2014-10-16 (×3): qty 150

## 2014-10-16 MED ORDER — SACCHAROMYCES BOULARDII 250 MG PO CAPS
250.0000 mg | ORAL_CAPSULE | Freq: Two times a day (BID) | ORAL | Status: DC
  Administered 2014-10-16 – 2014-10-26 (×21): 250 mg via ORAL
  Filled 2014-10-16 (×22): qty 1

## 2014-10-16 MED ORDER — LIDOCAINE 5 % EX OINT
TOPICAL_OINTMENT | Freq: Every day | CUTANEOUS | Status: DC | PRN
  Filled 2014-10-16: qty 35.44

## 2014-10-16 MED ORDER — COLLAGENASE 250 UNIT/GM EX OINT
TOPICAL_OINTMENT | Freq: Every day | CUTANEOUS | Status: DC
  Administered 2014-10-17 – 2014-10-18 (×2): via TOPICAL
  Administered 2014-10-19: 1 via TOPICAL
  Administered 2014-10-20 – 2014-10-24 (×4): via TOPICAL
  Filled 2014-10-16 (×3): qty 30

## 2014-10-16 MED ORDER — DEXTROSE 5 % IV SOLN
1.0000 g | Freq: Three times a day (TID) | INTRAVENOUS | Status: DC
  Administered 2014-10-16 – 2014-10-19 (×9): 1 g via INTRAVENOUS
  Filled 2014-10-16 (×10): qty 1

## 2014-10-16 MED ORDER — ASPIRIN 81 MG PO CHEW
81.0000 mg | CHEWABLE_TABLET | Freq: Every day | ORAL | Status: DC
  Administered 2014-10-17 – 2014-10-21 (×5): 81 mg
  Filled 2014-10-16 (×5): qty 1

## 2014-10-16 MED ORDER — HYDROCORTISONE NA SUCCINATE PF 100 MG IJ SOLR
50.0000 mg | Freq: Three times a day (TID) | INTRAMUSCULAR | Status: AC
  Administered 2014-10-16 – 2014-10-17 (×4): 50 mg via INTRAVENOUS
  Filled 2014-10-16 (×4): qty 2

## 2014-10-16 MED ORDER — HEPARIN SODIUM (PORCINE) 5000 UNIT/ML IJ SOLN
5000.0000 [IU] | Freq: Three times a day (TID) | INTRAMUSCULAR | Status: DC
  Administered 2014-10-16 – 2014-10-18 (×6): 5000 [IU] via SUBCUTANEOUS
  Filled 2014-10-16 (×8): qty 1

## 2014-10-16 MED ORDER — SODIUM CHLORIDE 0.9 % IV SOLN
250.0000 mL | INTRAVENOUS | Status: DC | PRN
Start: 2014-10-16 — End: 2014-10-20

## 2014-10-16 MED ORDER — LEVOFLOXACIN IN D5W 500 MG/100ML IV SOLN: 500.0000 mg | INTRAVENOUS | Status: DC

## 2014-10-16 MED ORDER — PREDNISONE 20 MG PO TABS
20.0000 mg | ORAL_TABLET | Freq: Every day | ORAL | Status: DC
  Administered 2014-10-16: 20 mg
  Filled 2014-10-16: qty 1

## 2014-10-16 MED ORDER — ASPIRIN 81 MG PO CHEW: 81.0000 mg | CHEWABLE_TABLET | Freq: Every day | ORAL | Status: DC

## 2014-10-16 MED ORDER — METRONIDAZOLE IN NACL 5-0.79 MG/ML-% IV SOLN
500.0000 mg | Freq: Three times a day (TID) | INTRAVENOUS | Status: DC
  Administered 2014-10-16 – 2014-10-19 (×9): 500 mg via INTRAVENOUS
  Filled 2014-10-16 (×10): qty 100

## 2014-10-16 MED ORDER — SODIUM CHLORIDE 0.9 % IV BOLUS (SEPSIS)
1000.0000 mL | INTRAVENOUS | Status: AC
  Administered 2014-10-16 (×2): 1000 mL via INTRAVENOUS

## 2014-10-16 MED ORDER — HYDROCORTISONE SOD SUCCINATE 100 MG PF FOR IT USE: 50.0000 mg | Freq: Three times a day (TID) | INTRAMUSCULAR | Status: DC

## 2014-10-16 MED ORDER — POLYVINYL ALCOHOL 1.4 % OP SOLN
1.0000 [drp] | OPHTHALMIC | Status: DC | PRN
  Filled 2014-10-16 (×2): qty 15

## 2014-10-16 MED ORDER — FOLIC ACID 1 MG PO TABS
1.0000 mg | ORAL_TABLET | Freq: Every day | ORAL | Status: DC
  Administered 2014-10-16 – 2014-10-26 (×11): 1 mg
  Filled 2014-10-16 (×11): qty 1

## 2014-10-16 MED ORDER — SODIUM CHLORIDE 0.9 % IV BOLUS (SEPSIS)
500.0000 mL | Freq: Once | INTRAVENOUS | Status: AC
  Administered 2014-10-16: 500 mL via INTRAVENOUS
  Administered 2014-10-16: 18:00:00 via INTRAVENOUS

## 2014-10-16 MED ORDER — DEXTROSE-NACL 5-0.45 % IV SOLN: INTRAVENOUS | Status: DC

## 2014-10-16 MED ORDER — METRONIDAZOLE IN NACL 5-0.79 MG/ML-% IV SOLN
500.0000 mg | Freq: Once | INTRAVENOUS | Status: AC
  Administered 2014-10-16: 500 mg via INTRAVENOUS
  Filled 2014-10-16: qty 100

## 2014-10-16 NOTE — Progress Notes (Signed)
UR completed 

## 2014-10-16 NOTE — ED Notes (Addendum)
Pt come via EMS for bed sores and UTI. Pt was at doctors office yesterday dx with UTI family refused admission. Pt walked into office yesterday and was A&O per family. Today family states patient confused "out of it and breathing fast". Pt present tachypneic, and lethargic. Family states ulcers on peri area have been there a while and have been to multiple doctors for them and wound care. Multiple draining wounds around rectum, perineum and vaginal area. Pt has peg tube in place prior to arrival, clean, dry and intact, flushed well.

## 2014-10-16 NOTE — Progress Notes (Signed)
Nurse noticed diffuse mottling over patients entire body. Patient cyanotic in hands and in feet. CCM notified of patients condition. Patient blood pressure soft and tachypnic. Will continue to monitor

## 2014-10-16 NOTE — Progress Notes (Signed)
ANTIBIOTIC CONSULT NOTE - INITIAL  Pharmacy Consult for aztreonam, vancomycin, metronidazole, levofloxacin Indication: sepsis in setting of UTI and cellulitis  Allergies  Allergen Reactions  . Doxycycline Nausea And Vomiting  . Other Nausea And Vomiting and Other (See Comments)    Anesthesia makes her very sleepy and makes it hard for her to come out of it.   Marland Kitchen Penicillins Hives and Nausea And Vomiting  . Sulfa Antibiotics Nausea And Vomiting    Patient Measurements: Weight: 115 lb (52.164 kg)   Vital Signs: Temp: 101.2 F (38.4 C) (01/19 0725) Temp Source: Rectal (01/19 0725) BP: 165/100 mmHg (01/19 0725) Pulse Rate: 133 (01/19 0725) Intake/Output from previous day:   Intake/Output from this shift:    Labs:  Recent Labs  10/16/14 0756  WBC 8.9  HGB 15.5*  17.0*  PLT 137*  CREATININE 0.90   Estimated Creatinine Clearance: 31.6 mL/min (by C-G formula based on Cr of 0.9). No results for input(s): VANCOTROUGH, VANCOPEAK, VANCORANDOM, GENTTROUGH, GENTPEAK, GENTRANDOM, TOBRATROUGH, TOBRAPEAK, TOBRARND, AMIKACINPEAK, AMIKACINTROU, AMIKACIN in the last 72 hours.    Medical History: Past Medical History  Diagnosis Date  . Arthritis   . Polymyalgia   . Acid reflux   . Immune deficiency disorder     autoimmune disorder - polymyalgia - on prednisone  . Breast cancer     Treated with lumpectomy and radiation  . Giardia   . Pinworms   . History of hiatal hernia   . Pneumonia   . Dementia   . CHF (congestive heart failure)   . Dysrhythmia    Microbiology: 1/19 blood x2: collected 1/19 urine: ordered 1/19 wound/buttocks: ordered  Anti-infectives: 1/19 >>aztreonam >> 1/19 >>vancomycin >>   1/19 >>metronidazole >> 1/19 >>levofloxacin >>   Assessment: 81 y/o F brought to ED with fever, fatigue, AMS. UA consistent with infection, multiple deep decubitus ulcers present on buttocks, and lactate 6.69 - code sepsis called.  Aztreonam, vanco, metronidazole,  levofloxacin ordered with pharmacy dosing assistance requested.  Patient had started on Cipro yesterday PM as outpatient for UTI.   EDP ordered the initial doses of aztreonam, vancomycin, and metronidazole.  Goal of Therapy:  Appropriate antibiotic dosing for indication and renal function; eradication of infection. Vancomycin trough 15-20   Plan:  1. Aztreonam 2g IV x 1 as ordered by EDP, then 1g IV q8h 2. Vancomycin 1g IV x 1 as ordered by EDP, then 750 mg IV q24h 3. Metronidazole 500 mg IV x1 as ordered by EDP, then 500 mg IV q8h 4. Levofloxacin 500 mg IV q24h 5. Follow culture results, renal function, clinical course. 6. Check vancomycin trough at steady-state or sooner if clinically indicated unless early de-escalation occurs.  Clayburn Pert, PharmD, BCPS Pager: [phone] 10/16/2014  8:41 AM

## 2014-10-16 NOTE — Consult Note (Signed)
Reason for Consult:Buttock ans sacral wounds Referring Physician: Noe Gens, NP CCM  Martha Mcbride is an 81 y.o. female.  HPI: 81 y/o F with a past medical history of breast cancer (remote), dementia, chronic diastolic CHF (baseline Lasix 20 mg BID), aortic stenosis and severely calcified mitral valve with moderate stenosis, gangrenous toes on the right foot (followed by Dr. Oneida Alar), polymyalgia rheumatica on prednisone (20 mg QD), interstitial lung disease / presumed IPF, large hiatal hernia, severe dysphasia status post PEG tube placement, multiple admissions for aspiration pneumonia, chronic diarrhea, who is chair/bed bound at baseline (followed at the wound clinic at [facility]). She is cared for at home by her daughters [name] and [name].  On 1/19 the patient presented to the [facility] emergency room via EMS with progressive confusion, lethargy and tachypnea. Initial BP 118/65, rate 133, rectal temperature of 101.2, RR 31 and SPO2 94%. Initial lab work notable for hgb 15.5, Na 151, CL 1:15, BUN 54, sr cr on 0.17, albumin 2.9, glucose 735 and lactic acid of 6.69. Repeat urinalysis on 1/19 with glucose greater than 1000, negative nitrite, negative leukocytes, 3-6 WBC, and few bacteria. CXR was negative for acute infiltrate. She was started on an insulin drip in the emergency room and treated with IV abx for potential wound infection/UTI. Of note, her caretakers have apparently had something similar to a viral gastroenteritis. She is on tube feedings and has chronic diarrhea and non healing buttocks and sacral ulcers. Left upper buttock with full thickness wound measuring 2X2X.2cm depth, 50% yellow, 50% red. Small amt yellow drainage, no odor. Left outer buttock with full thickness wound measuring 4X2X.2cm depth, 50% yellow, 50% red. Small amt yellow drainage, no odor. stage 3 pressure ulcer; 7X3X1cm. 80% yellow, interspersed with 20% red. Swab inserted is very painful and resulted in  large amt thick tan pus draining. No odor. Wound is located directly next to rectum in gluteal cleft/sacrum area and extends to the vaginal area.. There is also a wound culture pending from the ER, according to the family. We are ask to see the pt and evaluate decubitus.   Past Medical History  Diagnosis Date  . Arthritis   . Polymyalgia   . Acid reflux   . Immune deficiency disorder     autoimmune disorder - polymyalgia - on prednisone  . Breast cancer     Treated with lumpectomy and radiation  . Giardia   . Pinworms   . History of hiatal hernia   . Pneumonia   . Dementia   . CHF (congestive heart failure)   . Dysrhythmia     Past Surgical History  Procedure Laterality Date  . Breast lumpectomy    . Partial hysterectomy    . Tonsillectomy    . Tee without cardioversion N/A 07/20/2014    Procedure: TRANSESOPHAGEAL ECHOCARDIOGRAM (TEE);  Surgeon: Candee Furbish, MD;  Location: [facility] ENDOSCOPY;  Service: Cardiovascular;  Laterality: N/A;  . Abdominal hysterectomy      Family History  Problem Relation Age of Onset  . Stroke Mother     Social History:  reports that she has never smoked. She has never used smokeless tobacco. She reports that she does not drink alcohol or use illicit drugs.  Allergies:  Allergies  Allergen Reactions  . Doxycycline Nausea And Vomiting  . Other Nausea And Vomiting and Other (See Comments)    Anesthesia makes her very sleepy and makes it hard for her to come out of it.   Marland Kitchen Penicillins  Hives and Nausea And Vomiting  . Sulfa Antibiotics Nausea And Vomiting    Medications:  Prior to Admission:  Prescriptions prior to admission  Medication Sig Dispense Refill Last Dose  . aspirin 81 MG chewable tablet Place 1 tablet (81 mg total) into feeding tube daily.   10/15/2014 at Unknown time  . ciprofloxacin (CIPRO) 500 MG tablet Take 500 mg by mouth 2 (two) times daily. For 10 days   10/16/2014 at Unknown time  . folic acid (FOLVITE) 1 MG tablet Place 1  tablet (1 mg total) into feeding tube daily.   10/15/2014 at Unknown time  . furosemide (LASIX) 10 MG/ML solution Place 4 mLs (40 mg total) into feeding tube daily. (Patient taking differently: Place 20 mg into feeding tube 2 (two) times daily. ) 120 mL 6 10/15/2014 at Unknown time  . hydroxypropyl methylcellulose / hypromellose (ISOPTO TEARS / GONIOVISC) 2.5 % ophthalmic solution Place 1 drop into both eyes daily as needed for dry eyes.    unknown  . lidocaine (XYLOCAINE) 2 % jelly Apply 1 application topically as needed (for sores).   10/16/2014 at Unknown time  . loperamide (IMODIUM) 1 MG/5ML solution Place 10 mLs (2 mg total) into feeding tube 2 (two) times daily. May take 1 additional  dose or decrease as needed 120 mL 1 10/15/2014 at Unknown time  . metoprolol tartrate (LOPRESSOR) 25 mg/10 mL SUSP Place 2.5 mLs (6.25 mg total) into feeding tube 2 (two) times daily. 30 mL 12 10/15/2014 at 2000  . Nutritional Supplements (PERATIVE) LIQD 8oz at 6am, 7oz at 10am, 2pm, 6pm and 10pm via G tube   10/16/2014 at 0620  . potassium chloride 20 MEQ/15ML (10%) SOLN Place 15 mLs (20 mEq total) into feeding tube daily. 450 mL 0 10/15/2014 at Unknown time  . predniSONE 5 MG/5ML solution Place 40 mLs (40 mg total) into feeding tube daily. (Patient taking differently: Place 20 mg into feeding tube daily. ) 500 mL 2 10/15/2014 at Unknown time  . ranitidine (ZANTAC) 75 MG/5ML syrup Place 10 mLs (150 mg total) into feeding tube at bedtime. 180 mL 1 10/15/2014 at Unknown time  . saccharomyces boulardii (FLORASTOR) 250 MG capsule Take 1 capsule (250 mg total) by mouth 2 (two) times daily. (Patient taking differently: 250 mg by Feeding Tube route 2 (two) times daily. ) 60 capsule 0 10/15/2014 at Unknown time  . Water For Irrigation, Sterile (FREE WATER) SOLN Place 120 mLs into feeding tube 4 (four) times daily.   10/16/2014 at Unknown time   Scheduled: . aspirin  324 mg Oral NOW   Or  . aspirin  300 mg Rectal NOW  . [START ON  10/17/2014] aspirin  81 mg Per Tube Daily  . aztreonam  1 g Intravenous Q8H  . collagenase   Topical Daily  . folic acid  1 mg Per Tube Daily  . heparin  5,000 Units Subcutaneous 3 times per day  . [START ON 10/17/2014] lidocaine  1 application Topical Daily  . metronidazole  500 mg Intravenous Q8H  . predniSONE  20 mg Per Tube Daily  . saccharomyces boulardii  250 mg Oral BID  . [START ON 10/17/2014] vancomycin  750 mg Intravenous Q24H   Continuous: . sodium chloride Stopped (10/16/14 1450)  . dextrose 5 % and 0.45% NaCl 75 mL/hr at 10/16/14 1450  . insulin (NOVOLIN-R) infusion 1.8 Units/hr (10/16/14 1620)   IWL:NLGXQJ chloride, polyvinyl alcohol Anti-infectives    Start     Dose/Rate Route Frequency  Ordered Stop   10/17/14 1000  vancomycin (VANCOCIN) IVPB 750 mg/150 ml premix     750 mg150 mL/hr over 60 Minutes Intravenous Every 24 hours 10/16/14 0844     10/16/14 1600  aztreonam (AZACTAM) 1 g in dextrose 5 % 50 mL IVPB     1 g100 mL/hr over 30 Minutes Intravenous Every 8 hours 10/16/14 0844     10/16/14 1400  metroNIDAZOLE (FLAGYL) IVPB 500 mg     500 mg100 mL/hr over 60 Minutes Intravenous Every 8 hours 10/16/14 0844     10/16/14 1200  levofloxacin (LEVAQUIN) IVPB 500 mg  Status:  Discontinued     500 mg100 mL/hr over 60 Minutes Intravenous Every 24 hours 10/16/14 0828 10/16/14 1014   10/16/14 0800  aztreonam (AZACTAM) 2 g in dextrose 5 % 50 mL IVPB     2 g100 mL/hr over 30 Minutes Intravenous  Once 10/16/14 0747 10/16/14 0855   10/16/14 0800  metroNIDAZOLE (FLAGYL) IVPB 500 mg     500 mg100 mL/hr over 60 Minutes Intravenous  Once 10/16/14 0747 10/16/14 0925   10/16/14 0800  vancomycin (VANCOCIN) IVPB 1000 mg/200 mL premix     1,000 mg200 mL/hr over 60 Minutes Intravenous  Once 10/16/14 0747 10/16/14 1029      Results for orders placed or performed during the hospital encounter of 10/16/14 (from the past 48 hour(s))  I-Stat CG4 Lactic Acid, ED     Status: Abnormal   Collection  Time: 10/16/14  7:48 AM  Result Value Ref Range   Lactic Acid, Venous 6.69 (H) 0.5 - 2.2 mmol/L  CBC WITH DIFFERENTIAL     Status: Abnormal   Collection Time: 10/16/14  7:56 AM  Result Value Ref Range   WBC 8.9 4.0 - 10.5 K/uL   RBC 4.80 3.87 - 5.11 MIL/uL   Hemoglobin 15.5 (H) 12.0 - 15.0 g/dL   HCT 50.6 (H) 36.0 - 46.0 %   MCV 105.4 (H) 78.0 - 100.0 fL   MCH 32.3 26.0 - 34.0 pg   MCHC 30.6 30.0 - 36.0 g/dL   RDW 16.2 (H) 11.5 - 15.5 %   Platelets 137 (L) 150 - 400 K/uL   Neutrophils Relative % 60 43 - 77 %   Neutro Abs 5.4 1.7 - 7.7 K/uL   Lymphocytes Relative 32 12 - 46 %   Lymphs Abs 2.8 0.7 - 4.0 K/uL   Monocytes Relative 8 3 - 12 %   Monocytes Absolute 0.7 0.1 - 1.0 K/uL   Eosinophils Relative 0 0 - 5 %   Eosinophils Absolute 0.0 0.0 - 0.7 K/uL   Basophils Relative 0 0 - 1 %   Basophils Absolute 0.0 0.0 - 0.1 K/uL  Comprehensive metabolic panel     Status: Abnormal   Collection Time: 10/16/14  7:56 AM  Result Value Ref Range   Sodium 151 (H) 135 - 145 mmol/L    Comment: Please note change in reference range.   Potassium 4.8 3.5 - 5.1 mmol/L    Comment: Please note change in reference range.   Chloride 115 (H) 96 - 112 mEq/L   CO2 23 19 - 32 mmol/L   Glucose, Bld 735 (HH) 70 - 99 mg/dL    Comment: REPEATED TO VERIFY CRITICAL RESULT CALLED TO, READ BACK BY AND VERIFIED WITH: BOLTON,G. RN AT 0932 10/16/14 MULLINS,T    BUN 54 (H) 6 - 23 mg/dL   Creatinine, Ser 1.17 (H) 0.50 - 1.10 mg/dL   Calcium 8.9 8.4 -  10.5 mg/dL   Total Protein 6.1 6.0 - 8.3 g/dL   Albumin 2.9 (L) 3.5 - 5.2 g/dL   AST 36 0 - 37 U/L   ALT 22 0 - 35 U/L   Alkaline Phosphatase 127 (H) 39 - 117 U/L   Total Bilirubin 1.1 0.3 - 1.2 mg/dL   GFR calc non Af Amer 41 (L) >90 mL/min   GFR calc Af Amer 47 (L) >90 mL/min    Comment: (NOTE) The eGFR has been calculated using the CKD EPI equation. This calculation has not been validated in all clinical situations. eGFR's persistently <90 mL/min signify  possible Chronic Kidney Disease.    Anion gap 13 5 - 15  Brain natriuretic peptide - IF patient is dyspneic     Status: Abnormal   Collection Time: 10/16/14  7:56 AM  Result Value Ref Range   B Natriuretic Peptide 112.2 (H) 0.0 - 100.0 pg/mL    Comment: Please note change in reference range.  Troponin I     Status: None   Collection Time: 10/16/14  7:56 AM  Result Value Ref Range   Troponin I 0.03 <0.031 ng/mL    Comment:        NO INDICATION OF MYOCARDIAL INJURY. Please note change in reference range.   I-Stat Chem 8, ED     Status: Abnormal   Collection Time: 10/16/14  7:56 AM  Result Value Ref Range   Sodium 154 (H) 135 - 145 mmol/L   Potassium 4.0 3.5 - 5.1 mmol/L   Chloride 116 (H) 96 - 112 mEq/L   BUN 49 (H) 6 - 23 mg/dL   Creatinine, Ser 0.90 0.50 - 1.10 mg/dL   Glucose, Bld >700 (HH) 70 - 99 mg/dL   Calcium, Ion 1.16 1.13 - 1.30 mmol/L   TCO2 21 0 - 100 mmol/L   Hemoglobin 17.0 (H) 12.0 - 15.0 g/dL   HCT 50.0 (H) 36.0 - 46.0 %   Comment NOTIFIED PHYSICIAN   CBG monitoring, ED     Status: Abnormal   Collection Time: 10/16/14  8:06 AM  Result Value Ref Range   Glucose-Capillary >600 (HH) 70 - 99 mg/dL  Urinalysis, Routine w reflex microscopic     Status: Abnormal   Collection Time: 10/16/14  8:27 AM  Result Value Ref Range   Color, Urine YELLOW YELLOW   APPearance CLOUDY (A) CLEAR   Specific Gravity, Urine 1.033 (H) 1.005 - 1.030   pH 5.0 5.0 - 8.0   Glucose, UA >1000 (A) NEGATIVE mg/dL   Hgb urine dipstick TRACE (A) NEGATIVE   Bilirubin Urine NEGATIVE NEGATIVE   Ketones, ur NEGATIVE NEGATIVE mg/dL   Protein, ur NEGATIVE NEGATIVE mg/dL   Urobilinogen, UA 1.0 0.0 - 1.0 mg/dL   Nitrite NEGATIVE NEGATIVE   Leukocytes, UA NEGATIVE NEGATIVE  Urine microscopic-add on     Status: Abnormal   Collection Time: 10/16/14  8:27 AM  Result Value Ref Range   Squamous Epithelial / LPF RARE RARE   WBC, UA 3-6 <3 WBC/hpf   RBC / HPF 3-6 <3 RBC/hpf   Bacteria, UA FEW (A)  RARE   Casts HYALINE CASTS (A) NEGATIVE  CBG monitoring, ED     Status: Abnormal   Collection Time: 10/16/14 10:11 AM  Result Value Ref Range   Glucose-Capillary 592 (HH) 70 - 99 mg/dL  Lactic acid, plasma     Status: Abnormal   Collection Time: 10/16/14 10:31 AM  Result Value Ref Range   Lactic Acid,  Venous 5.2 (H) 0.5 - 2.2 mmol/L  Ketones, qualitative     Status: None   Collection Time: 10/16/14 10:31 AM  Result Value Ref Range   Acetone, Bld NEGATIVE NEGATIVE  I-Stat CG4 Lactic Acid, ED     Status: Abnormal   Collection Time: 10/16/14 10:45 AM  Result Value Ref Range   Lactic Acid, Venous 5.35 (H) 0.5 - 2.2 mmol/L  CBG monitoring, ED     Status: Abnormal   Collection Time: 10/16/14 11:26 AM  Result Value Ref Range   Glucose-Capillary 396 (H) 70 - 99 mg/dL    Dg Chest Port 1 View  10/16/2014   CLINICAL DATA:  Shortness of breath and lethargy  EXAM: PORTABLE CHEST - 1 VIEW  COMPARISON:  September 26, 2014  FINDINGS: There is no edema or consolidation. Heart size and pulmonary vascularity are normal. No adenopathy. There is calcification in the mitral annulus. There is degenerative change in the lower thoracic and upper lumbar region with upper lumbar levoscoliosis.  IMPRESSION: No edema or consolidation.   Electronically Signed   By: Lowella Grip M.D.   On: 10/16/2014 08:16    Review of Systems  Unable to perform ROS: acuity of condition   Blood pressure 92/56, pulse 90, temperature 98.4 F (36.9 C), temperature source Rectal, resp. rate 22, height $RemoveBe'5\' 2"'[id number]$  (1.575 m), weight 52.164 kg (115 lb), SpO2 100 %. Physical Exam  Constitutional: No distress.  Thin frail eldery female who has pain every time she is moved.  HENT:  Head: Normocephalic and atraumatic.  Nose: Nose normal.  Eyes: Conjunctivae and EOM are normal. Right eye exhibits no discharge. Left eye exhibits no discharge. No scleral icterus.  Neck: Normal range of motion. Neck supple. No JVD present. No tracheal  deviation present. No thyromegaly present.  Cardiovascular: Regular rhythm.   Murmur heard. She is loosing toes on both feet.  Respiratory: Effort normal and breath sounds normal. No respiratory distress. She has no wheezes. She has no rales. She exhibits no tenderness.  GI: Soft. Bowel sounds are normal. She exhibits no distension and no mass. There is no tenderness. There is no rebound and no guarding.  PEG looks good.  Genitourinary:  eft upper buttock with full thickness wound measuring 2X2X.2cm depth, 50% yellow, 50% red. Small amt yellow drainage, no odor. Left outer buttock with full thickness wound measuring 4X2X.2cm depth, 50% yellow, 50% red. Small amt yellow drainage, no odor. stage 3 pressure ulcer; 7X3X1cm. 80% yellow, interspersed with 20% red. This is from wound Care, when i saw her everything is clean and I see nothing to surgically debride.    Musculoskeletal: She exhibits no edema or tenderness.  Lymphadenopathy:    She has no cervical adenopathy.  Neurological: She is alert.  She has pain with any movement  Skin: She is not diaphoretic.  Thin skin and pictures below.   Sacral ulcer    Rectum to vagina.    Assessment/Plan: 1.  Sepsis 2.  Non healing decubitus, sacrum and rectum to vagina 3.  Chronic incontinence on tube feedings 4.  Polymyalgia on chronic steroids 5.  Chronic immune deficiency disorder 6.  Dementia 7.  Deconditioning and malnutrition/currently bed bound 8.  Aortic and mitral stenosis 9.  Hx of CHF 10.  Hiatal hernia  Plan:  Currently I do not see anything we can do surgically.  Her immobility and chronic incontinence make this very difficult to handle.  It is clean and I would continue dressing changes.  A Ct is ordered and we will review this.  Anay Rathe 10/16/2014, 4:32 PM

## 2014-10-16 NOTE — ED Notes (Signed)
CGB over 600 reported to Dr Waverly Ferrari and RN Karen Kays

## 2014-10-16 NOTE — Progress Notes (Signed)
eLink Physician-Brief Progress Note Patient Name: Martha Mcbride DOB: 09-22-1925 MRN: [id number]   Date of Service  10/16/2014  HPI/Events of Note  RN reports diffuse mottling. BP 100/62  eICU Interventions  Occult shock - NS bolus ordered     Intervention Category Major Interventions: Shock - evaluation and management  Merton Border 10/16/2014, 5:01 PM

## 2014-10-16 NOTE — H&P (Signed)
PULMONARY / CRITICAL CARE MEDICINE   Name: Martha Mcbride MRN: [id number] DOB: 22-Apr-1925    ADMISSION DATE:  10/16/2014  REFERRING MD :  Dr. Betsey Holiday / EDP   CHIEF COMPLAINT:  Sepsis   INITIAL PRESENTATION: 81 y/o F admitted with sepsis (urinary + wounds) & hyperglycemia.    STUDIES:    SIGNIFICANT EVENTS: 1/19  Admit with concerns for sepsis (urinary & wounds as source), hyperglycemia   HISTORY OF PRESENT ILLNESS:  81 y/o F with a past medical history of breast cancer (remote), dementia, chronic diastolic CHF (baseline Lasix 20 mg BID), aortic stenosis and severely calcified mitral valve with moderate stenosis, gangrenous toes on the right foot (followed by Dr. Oneida Mcbride), polymyalgia rheumatica on prednisone (20 mg QD), interstitial lung disease / presumed IPF, large hiatal hernia, severe dysphasia status post PEG tube placement, multiple admissions for aspiration pneumonia, chronic diarrhea, who is chair/bed bound at baseline (followed at the wound clinic at [facility]).  She is cared for at home by her daughters Martha Mcbride and Martha Mcbride.    Chart review notes that they call the cardiology office on 1/17 with concerns for tachycardia and lethargy. The daughters medicated the patient with an additional dose of Lopressor. They returned to the cardiology office for an acute visit on 1/18 with a three-day history of progressive weakness and fatigue. Notes reflect that a urinalysis was obtained and the family was encouraged to send the patient to the hospital for evaluation. She was treated with Cipro for UTI. However, they obtained a urinalysis and then proceeded to leave the office.  They called the office to let them know that they were taking her home. Attempts were made to contact her via phone & messages were left at that time.    On 1/19 the patient presented to the [facility] emergency room via EMS with progressive confusion, lethargy and tachypnea. Initial BP 118/65, rate 133, rectal temperature  of 101.2, RR 31 and SPO2 94%.  Initial lab work notable for hgb 15.5, Na 151, CL 1:15, BUN 54, sr cr on 0.17, albumin 2.9, glucose 735 and lactic acid of 6.69.  Repeat urinalysis on 1/19 with glucose greater than 1000, negative nitrite, negative leukocytes, 3-6 WBC, and few bacteria.  CXR was negative for acute infiltrate.  She was started on an insulin drip in the emergency room and treated with IV abx for potential wound infection/UTI.  Of note, her caretakers have apparently had something similar to a viral gastroenteritis. PCCM consulted for ICU admission.       PAST MEDICAL HISTORY :   has a past medical history of Arthritis; Polymyalgia; Acid reflux; Immune deficiency disorder; Breast cancer; Giardia; Pinworms; History of hiatal hernia; Pneumonia; Dementia; CHF (congestive heart failure); and Dysrhythmia.  has past surgical history that includes Breast lumpectomy; Partial hysterectomy; Tonsillectomy; TEE without cardioversion (N/A, 07/20/2014); and Abdominal hysterectomy.   HOME MEDICATIONS:  Prior to Admission medications   Medication Sig Start Date End Date Taking? Authorizing Provider  aspirin 81 MG chewable tablet Place 1 tablet (81 mg total) into feeding tube daily. 08/10/14  Yes Martha Cousins, MD  ciprofloxacin (CIPRO) 500 MG tablet Take 500 mg by mouth 2 (two) times daily. For 10 days 10/15/14 10/25/14 Yes Historical Provider, MD  folic acid (FOLVITE) 1 MG tablet Place 1 tablet (1 mg total) into feeding tube daily. 08/10/14  Yes Martha Cousins, MD  furosemide (LASIX) 10 MG/ML solution Place 4 mLs (40 mg total) into feeding tube daily. Patient  taking differently: Place 20 mg into feeding tube 2 (two) times daily.  09/12/14  Yes Martha Canales, PA-C  hydroxypropyl methylcellulose / hypromellose (ISOPTO TEARS / GONIOVISC) 2.5 % ophthalmic solution Place 1 drop into both eyes daily as needed for dry eyes.    Yes Historical Provider, MD  lidocaine (XYLOCAINE) 2 % jelly Apply 1  application topically as needed (for sores).   Yes Historical Provider, MD  loperamide (IMODIUM) 1 MG/5ML solution Place 10 mLs (2 mg total) into feeding tube 2 (two) times daily. May take 1 additional  dose or decrease as needed 09/05/14  Yes Martha Colace, NP  metoprolol tartrate (LOPRESSOR) 25 mg/10 mL SUSP Place 2.5 mLs (6.25 mg total) into feeding tube 2 (two) times daily. 08/22/14  Yes Lelon Perla, MD  Nutritional Supplements (PERATIVE) LIQD 8oz at 6am, Ladona Mow at 10am, 2pm, 6pm and 10pm via G tube   Yes Historical Provider, MD  potassium chloride 20 MEQ/15ML (10%) SOLN Place 15 mLs (20 mEq total) into feeding tube daily. 08/09/14  Yes Martha Cousins, MD  predniSONE 5 MG/5ML solution Place 40 mLs (40 mg total) into feeding tube daily. Patient taking differently: Place 20 mg into feeding tube daily.  09/10/14  Yes Martha Noel, MD  ranitidine (ZANTAC) 75 MG/5ML syrup Place 10 mLs (150 mg total) into feeding tube at bedtime. 09/05/14  Yes Martha Colace, NP  saccharomyces boulardii (FLORASTOR) 250 MG capsule Take 1 capsule (250 mg total) by mouth 2 (two) times daily. Patient taking differently: 250 mg by Feeding Tube route 2 (two) times daily.  08/10/14  Yes Martha Cousins, MD  Water For Irrigation, Sterile (FREE WATER) SOLN Place 120 mLs into feeding tube 4 (four) times daily. 08/09/14  Yes Martha Cousins, MD   Allergies  Allergen Reactions  . Doxycycline Nausea And Vomiting  . Other Nausea And Vomiting and Other (See Comments)    Anesthesia makes her very sleepy and makes it hard for her to come out of it.   Marland Kitchen Penicillins Hives and Nausea And Vomiting  . Sulfa Antibiotics Nausea And Vomiting    FAMILY HISTORY:  has no family status information on file.    SOCIAL HISTORY:  reports that she has never smoked. She has never used smokeless tobacco. She reports that she does not drink alcohol or use illicit drugs.  REVIEW OF SYSTEMS:  Unable to complete as patient is  altered.  Information obtained from prior medical documentation, staff and family at bedside.    SUBJECTIVE:   VITAL SIGNS: Temp:  [101.2 F (38.4 C)] 101.2 F (38.4 C) (01/19 0725) Pulse Rate:  [94-133] 94 (01/19 0921) Resp:  [28-31] 28 (01/19 0921) BP: (110-165)/(65-100) 118/65 mmHg (01/19 0921) SpO2:  [94 %-99 %] 99 % (01/19 0921) Weight:  [115 lb (52.164 kg)-115 lb 1.6 oz (52.209 kg)] 115 lb (52.164 kg) (01/19 0725)   HEMODYNAMICS:     VENTILATOR SETTINGS:     INTAKE / OUTPUT: No intake or output data in the 24 hours ending 10/16/14 0934  PHYSICAL EXAMINATION: General:  Frail elderly female, appears uncomfortable  Neuro:  Arouses to name, smiles/nods but not oriented, MAE, generalized weakness  HEENT:  Mm pink/dry, no jvd, dentures in place  Cardiovascular:  s1s2 rrr, 3/6 SEM Lungs:  Tachypnea (low 30's), lungs bilaterally with fine basilar crackles  Abdomen:  PEG c/d/i, NTND Musculoskeletal:  No acute deformities  Skin:  Warm/dry, good cap refills, 1+ pitting edema in BLE, sacral/rectal  wound erythematous, ~ stage 3 with rectal excoriation,  deep fissure like open wound approx 3 inches in length in intergluteal cleft   LABS:  CBC  Recent Labs Lab 10/16/14 0756  WBC 8.9  HGB 15.5*  17.0*  HCT 50.6*  50.0*  PLT 137*   Coag's No results for input(s): APTT, INR in the last 168 hours.   BMET  Recent Labs Lab 10/16/14 0756  NA 151*  154*  K 4.8  4.0  CL 115*  116*  CO2 23  BUN 54*  49*  CREATININE 1.17*  0.90  GLUCOSE 735*  >700*   Electrolytes  Recent Labs Lab 10/16/14 0756  CALCIUM 8.9   Sepsis Markers  Recent Labs Lab 10/16/14 0748  LATICACIDVEN 6.69*   ABG No results for input(s): PHART, PCO2ART, PO2ART in the last 168 hours.   Liver Enzymes  Recent Labs Lab 10/16/14 0756  AST 36  ALT 22  ALKPHOS 127*  BILITOT 1.1  ALBUMIN 2.9*   Cardiac Enzymes  Recent Labs Lab 10/16/14 0756  TROPONINI 0.03   Glucose  Recent  Labs Lab 10/16/14 0806  GLUCAP >600*    Imaging No results found.   ASSESSMENT / PLAN:  PULMONARY OETT A: ILD - presumed IPF Aspiration Risk  P:   Pulmonary hygiene Upright positioning, HOB @ 30 degrees  CARDIOVASCULAR CVL A:  Sepsis - concern for urinary / wound source  Chronic dCHF Hx HTN Known Murmur - AS - moderate on TEE 07/20/14 & calcified MV P:  Hold baseline lasix (20 mg BID) 1/19, consider restart in am 1/20 Hold lopressor (6.25 BID) SDU monitoring  See ID  Continue baseline prednisone, if develops hypotension, will need stress steroids   RENAL A:   At Risk AKI - in setting of sepsis  P:   Monitor BMP Replace electrolytes as indicated   GASTROINTESTINAL A:   Severe Dysphagia - s/p PEG, NPO at baseline (since late fall 2015).  In setting of large hiatal hernia Diarrhea - rule out infectious etiology  P:   NPO  Hold PEG feeding 1/19 with diarrhea  Hold home imodium Continue florastor   HEMATOLOGIC A:   Thrombocytopenia - likely in setting of sepsis  P:  Trend CBC Heparin for DVT prophylaxis, monitor platelets closely   INFECTIOUS A:   Sacral Decubitus - advanced wound since prior admissions ? UTI  P:   BCx2 1/19 >>  UC  1/19 >>  C-Diff 1/19 >>  Norovirus 1/19 >>   Aztreonam, start date 1/19, day 1/x Flagyl, start date 1/19, day 1/x Vanco, start date 1/19, day 1/x    ENDOCRINE A:   Hyperglycemia  - no Hx of DM, ? If home formula of TF concentration of glucose, prednisone + sepsis / stress response P:   ICU hyperglycemia Protocol  Insulin gtt Cautions NS administration with hx of dCHF   NEUROLOGIC A:   Acute Encephalopathy  Arthritis  Polymyalgia Rheumatica  Dementia  P:   Continue prednisone at 20mg  QD Monitor mental status, supportive care    FAMILY  - Updates: Granddaughters updated at bedside, family wishes for full code at this time.     Noe Gens, NP-C [facility] Pgr: [phone] or  [phone]   10/16/2014, 9:34 AM  Attending:  I have seen and examined the patient with nurse practitioner/resident and agree with the note above.   Ms. Bahner is well known to our service ON my exam she is miserable in pain,  quite volume deplete LUngs clear, sacral and perineal wounds are deep  I think the source of sepsis is the wound, +/- UTI, +/- c.diff Not sure why she has new onset diabetes Family notes a viral gastroenteritis is going through family including her caregivers Will check for C.diff, norovirus  I explained to her granddaughter we will treat as sepsis from urinary and wound source, treat elevated blood glucose with insulin.  We also want to control her pain, though she tends to be very sensitive to narcotics.    She is very frail and would not do well with life support if needed.  We will need to re-intiate this conversation again this hospitalization with both daughters present.  CC time by me 45 minutes  Roselie Awkward, MD [place] [facility] Pager: [phone] Cell: [phone] If no response, call [phone]

## 2014-10-16 NOTE — ED Notes (Addendum)
Critical result called from Tammy in the lab. Glucose 735, Dr. Waverly Ferrari notified.

## 2014-10-16 NOTE — ED Provider Notes (Signed)
CSN: [id number]     Arrival date & time 10/16/14  0719 History   First MD Initiated Contact with Patient 10/16/14 0725     Chief Complaint  Patient presents with  . Fever     (Consider location/radiation/quality/duration/timing/severity/associated sxs/prior Treatment) HPI Comments: Patient brought to the emergency department for evaluation of mental status changes and increased fatigue. Patient has been experiencing fatigue and malaise for several days. She was seen by her cardiologist yesterday and at that time a urinary tract infection was diagnosed, started Cipro last night. This morning, however, patient is lethargic, tachycardic and minimally responsive. Level V Caveat due to mental status change.   Patient is a 81 y.o. female presenting with fever.  Fever   Past Medical History  Diagnosis Date  . Arthritis   . Polymyalgia   . Acid reflux   . Immune deficiency disorder     autoimmune disorder - polymyalgia - on prednisone  . Breast cancer     Treated with lumpectomy and radiation  . Giardia   . Pinworms   . History of hiatal hernia   . Pneumonia   . Dementia   . CHF (congestive heart failure)   . Dysrhythmia    Past Surgical History  Procedure Laterality Date  . Breast lumpectomy    . Partial hysterectomy    . Tonsillectomy    . Tee without cardioversion N/A 07/20/2014    Procedure: TRANSESOPHAGEAL ECHOCARDIOGRAM (TEE);  Surgeon: Candee Furbish, MD;  Location: [facility] ENDOSCOPY;  Service: Cardiovascular;  Laterality: N/A;  . Abdominal hysterectomy     Family History  Problem Relation Age of Onset  . Stroke Mother    History  Substance Use Topics  . Smoking status: Never Smoker   . Smokeless tobacco: Never Used  . Alcohol Use: No   OB History    No data available     Review of Systems  Unable to perform ROS: Mental status change  Constitutional: Positive for fever.      Allergies  Doxycycline; Other; Penicillins; and Sulfa antibiotics  Home Medications     Prior to Admission medications   Medication Sig Start Date End Date Taking? Authorizing Provider  aspirin 81 MG chewable tablet Place 1 tablet (81 mg total) into feeding tube daily. 08/10/14  Yes Charlynne Cousins, MD  ciprofloxacin (CIPRO) 500 MG tablet Take 500 mg by mouth 2 (two) times daily. For 10 days 10/15/14 10/25/14 Yes Historical Provider, MD  folic acid (FOLVITE) 1 MG tablet Place 1 tablet (1 mg total) into feeding tube daily. 08/10/14  Yes Charlynne Cousins, MD  furosemide (LASIX) 10 MG/ML solution Place 4 mLs (40 mg total) into feeding tube daily. Patient taking differently: Place 20 mg into feeding tube 2 (two) times daily.  09/12/14  Yes Brett Canales, PA-C  hydroxypropyl methylcellulose / hypromellose (ISOPTO TEARS / GONIOVISC) 2.5 % ophthalmic solution Place 1 drop into both eyes daily as needed for dry eyes.    Yes Historical Provider, MD  lidocaine (XYLOCAINE) 2 % jelly Apply 1 application topically as needed (for sores).   Yes Historical Provider, MD  loperamide (IMODIUM) 1 MG/5ML solution Place 10 mLs (2 mg total) into feeding tube 2 (two) times daily. May take 1 additional  dose or decrease as needed 09/05/14  Yes Erick Colace, NP  metoprolol tartrate (LOPRESSOR) 25 mg/10 mL SUSP Place 2.5 mLs (6.25 mg total) into feeding tube 2 (two) times daily. 08/22/14  Yes Lelon Perla, MD  Nutritional  Supplements (PERATIVE) LIQD 8oz at 6am, 7oz at 10am, 2pm, 6pm and 10pm via G tube   Yes Historical Provider, MD  potassium chloride 20 MEQ/15ML (10%) SOLN Place 15 mLs (20 mEq total) into feeding tube daily. 08/09/14  Yes Charlynne Cousins, MD  predniSONE 5 MG/5ML solution Place 40 mLs (40 mg total) into feeding tube daily. Patient taking differently: Place 20 mg into feeding tube daily.  09/10/14  Yes Rigoberto Noel, MD  ranitidine (ZANTAC) 75 MG/5ML syrup Place 10 mLs (150 mg total) into feeding tube at bedtime. 09/05/14  Yes Erick Colace, NP  saccharomyces boulardii  (FLORASTOR) 250 MG capsule Take 1 capsule (250 mg total) by mouth 2 (two) times daily. Patient taking differently: 250 mg by Feeding Tube route 2 (two) times daily.  08/10/14  Yes Charlynne Cousins, MD  Water For Irrigation, Sterile (FREE WATER) SOLN Place 120 mLs into feeding tube 4 (four) times daily. 08/09/14  Yes Charlynne Cousins, MD   BP 118/65 mmHg  Pulse 133  Temp(Src) 101.2 F (38.4 C) (Rectal)  Resp 31  Wt 115 lb (52.164 kg)  SpO2 94% Physical Exam  Constitutional: She appears well-developed and well-nourished. She appears lethargic. She appears distressed.  HENT:  Head: Normocephalic and atraumatic.  Right Ear: Hearing normal.  Left Ear: Hearing normal.  Nose: Nose normal.  Mouth/Throat: Oropharynx is clear and moist. Mucous membranes are dry.  Eyes: Conjunctivae and EOM are normal. Pupils are equal, round, and reactive to light.  Neck: Normal range of motion. Neck supple.  Cardiovascular: Regular rhythm, S1 normal and S2 normal.  Tachycardia present.  Exam reveals no gallop and no friction rub.   No murmur heard. Pulmonary/Chest: Effort normal and breath sounds normal. Tachypnea noted. No respiratory distress. She exhibits no tenderness.  Abdominal: Soft. Normal appearance and bowel sounds are normal. There is no hepatosplenomegaly. There is no tenderness. There is no rebound, no guarding, no tenderness at McBurney's point and negative Murphy's sign. No hernia.  Musculoskeletal: Normal range of motion.  Neurological: She has normal strength. She appears lethargic. No cranial nerve deficit or sensory deficit. Coordination normal.  No verbal response to questions, but weakly follows commands and answers yes and no questions  Skin: Skin is warm and dry. Lesion (decubiti) and rash (mottled lower extremities) noted. No cyanosis.     Psychiatric: She has a normal mood and affect. Her speech is normal and behavior is normal. Thought content normal.  Nursing note and vitals  reviewed.   ED Course  Procedures (including critical care time) Labs Review Labs Reviewed  CBC WITH DIFFERENTIAL - Abnormal; Notable for the following:    Hemoglobin 15.5 (*)    HCT 50.6 (*)    MCV 105.4 (*)    RDW 16.2 (*)    Platelets 137 (*)    All other components within normal limits  COMPREHENSIVE METABOLIC PANEL - Abnormal; Notable for the following:    Sodium 151 (*)    Chloride 115 (*)    Glucose, Bld 735 (*)    BUN 54 (*)    Creatinine, Ser 1.17 (*)    Albumin 2.9 (*)    Alkaline Phosphatase 127 (*)    GFR calc non Af Amer 41 (*)    GFR calc Af Amer 47 (*)    All other components within normal limits  URINALYSIS, ROUTINE W REFLEX MICROSCOPIC - Abnormal; Notable for the following:    APPearance CLOUDY (*)    Specific Gravity, Urine  1.033 (*)    Glucose, UA >1000 (*)    Hgb urine dipstick TRACE (*)    All other components within normal limits  BRAIN NATRIURETIC PEPTIDE - Abnormal; Notable for the following:    B Natriuretic Peptide 112.2 (*)    All other components within normal limits  URINE MICROSCOPIC-ADD ON - Abnormal; Notable for the following:    Bacteria, UA FEW (*)    Casts HYALINE CASTS (*)    All other components within normal limits  I-STAT CG4 LACTIC ACID, ED - Abnormal; Notable for the following:    Lactic Acid, Venous 6.69 (*)    All other components within normal limits  I-STAT CHEM 8, ED - Abnormal; Notable for the following:    Sodium 154 (*)    Chloride 116 (*)    BUN 49 (*)    Glucose, Bld >700 (*)    Hemoglobin 17.0 (*)    HCT 50.0 (*)    All other components within normal limits  CBG MONITORING, ED - Abnormal; Notable for the following:    Glucose-Capillary >600 (*)    All other components within normal limits  CULTURE, BLOOD (ROUTINE X 2)  CULTURE, BLOOD (ROUTINE X 2)  URINE CULTURE  WOUND CULTURE  TROPONIN I  I-STAT CG4 LACTIC ACID, ED    Imaging Review Dg Chest Port 1 View  10/16/2014   CLINICAL DATA:  Shortness of breath  and lethargy  EXAM: PORTABLE CHEST - 1 VIEW  COMPARISON:  September 26, 2014  FINDINGS: There is no edema or consolidation. Heart size and pulmonary vascularity are normal. No adenopathy. There is calcification in the mitral annulus. There is degenerative change in the lower thoracic and upper lumbar region with upper lumbar levoscoliosis.  IMPRESSION: No edema or consolidation.   Electronically Signed   By: Lowella Grip M.D.   On: 10/16/2014 08:16     EKG Interpretation   Date/Time:  Tuesday October 16 2014 07:56:53 EST Ventricular Rate:  122 PR Interval:  128 QRS Duration: 88 QT Interval:  325 QTC Calculation: 463 R Axis:   -40 Text Interpretation:  Sinus tachycardia Probable left atrial enlargement  Left anterior fascicular block LVH with secondary repolarization  abnormality Anterior infarct, old ST elevation consider inferior injury or  acute infarct Confirmed by Jolan Upchurch  MD, [place] [phone]) on 10/16/2014  8:20:19 AM      MDM   Final diagnoses:  Sepsis, due to unspecified organism  Diabetic hyperosmolar non-ketotic state  Dehydration  UTI (lower urinary tract infection)  Decubitus skin ulcer   Patient presents to the emergency department with progressively worsening mental status. Patient seen yesterday by cardiology and admission was recommended. Family chose to take patient home at that time, has worsened since. Patient returns today now febrile. She is lethargic with minimal interactions. Temperature 101.2 with tachycardia and tachypnea. She does follow commands, however, and can answer yes and no questions. She does not require intubation at this time. Brief discussion with the family reveals that they want her to be a full code.  Workup reveals new onset diabetes with blood sugar above 700. No clear sign of DKA at this time. Patient significantly dehydrated. Aggressive hydration initiated per sepsis protocol will treat hyperglycemia as well. Initiate glucose  stabilizer.  Based on her febrile presentation, patient was initiated on sepsis protocol treatment. She was covered for skin and urine. She has a known urinary tract infection diagnosed yesterday. She has significant decubiti that are draining as well.  EKG performed at arrival reveals LVH with secondary repolarization. There are new ST elevations in inferior leads seen compared to most recent EKG available from last month. This was discussed briefly with Dr. Stanford Breed, her cardiologist. He felt that these ST changes were consistent with the LVH and did not require any immediate intervention.  Case discussed with Dr. Halford Chessman, critical care. Patient will require hospitalization.  CRITICAL CARE Performed by: Orpah Greek   Total critical care time: 45min  Critical care time was exclusive of separately billable procedures and treating other patients.  Critical care was necessary to treat or prevent imminent or life-threatening deterioration.  Critical care was time spent personally by me on the following activities: development of treatment plan with patient and/or surrogate as well as nursing, discussions with consultants, evaluation of patient's response to treatment, examination of patient, obtaining history from patient or surrogate, ordering and performing treatments and interventions, ordering and review of laboratory studies, ordering and review of radiographic studies, pulse oximetry and re-evaluation of patient's condition.     Orpah Greek, MD 10/16/14 [phone]

## 2014-10-16 NOTE — Progress Notes (Signed)
Called by [facility] RN to discuss wound findings.  Noted earlier brown / yellow crusting at sacral wound site.  With wound exploration with swab it yielded frank pus.     Plan: CT Pelvis to assess for tunneling / abscess at sacral site CCS consult as the family would "want everything done".  Although, do not feel she would be a surgical candidate given her overall state of health.   Appreciate WOC input   Noe Gens, NP-C [place] [facility] Pgr: [phone] or [phone]

## 2014-10-16 NOTE — ED Notes (Signed)
CBG 592 reported to [employer]

## 2014-10-16 NOTE — ED Notes (Signed)
Dr Waverly Ferrari notified of patient's lab value.

## 2014-10-16 NOTE — Progress Notes (Signed)
eLink Physician-Brief Progress Note Patient Name: Martha Mcbride DOB: [birth year] MRN: [id number]   Date of Service  10/16/2014  HPI/Events of Note  Occult shock. Chronic steroid therapy  eICU Interventions  DC prednisone Hydrocortisone - stress dose     Intervention Category Major Interventions: Shock - evaluation and management  Merton Border 10/16/2014, 5:04 PM

## 2014-10-16 NOTE — Progress Notes (Signed)
Utilization review completed.  

## 2014-10-16 NOTE — Consult Note (Addendum)
WOC wound consult note Reason for Consult:Consult requested for buttock and sacrum wounds.  Pt is familiar to [facility] team from previous admissions; refer to progress notes on 12/9 and 11/30.  Wounds have declined since previous admission.  Family at bedside to assess areas and discuss plan of care.  They state she has been followed by home health and was using Santyl ointment and Lidocaine topically to decrease discomfort with dressing changes, and was seen by the outpatient wound clinic at [facility] before Christmas. Family member states they recommended consultation from a Psychiatric nurse, but this had not occurred prior to admission.  Wound type:Sacrum area with stage 3 pressure ulcer; 7X3X1cm.  80% yellow, interspersed with 20% red.  Swab inserted is very painful and resulted in large amt thick tan pus draining. No odor.  Wound is located directly next to rectum in gluteal cleft/sacrum area and extends to the vaginal area.  Pt has been incontinent of urine and stool prior to admission and currently has a foley to contain urine and is on contact precautions for stool until culture is returned.  There is also a wound culture pending from the ER, according to the family. Pressure Ulcer POA: Yes Left upper buttock with full thickness wound measuring 2X2X.2cm depth, 50% yellow, 50% red.  Small amt yellow drainage, no odor. Left outer buttock with full thickness wound measuring 4X2X.2cm depth, 50% yellow, 50% red.  Small amt yellow drainage, no odor. Two toes on the right foot (2nd and 3rd digits) are necrotic at the most distal points. No erythema, odor, or drainage. Family states she has been followed as an outpatient by Dr Oneida Alar and he has ordered the areas be painted with betadine. Topical treatment will not be effective to promote healing to the toes. Dressing procedure/placement/frequency: Santyl ointment to chemically debride nonviable tissue, as was the plan of care prior to admission. Lidocaine  topically to decrease discomfort with dressing changes. If aggressive plan of care is desired, recommend CT scan to assess for presence of abscess and surgical consult for possible I&D if indicated.  Family states they are interested in aggressive treatment at this time.  Sport low-air-loss bed to reduce pressure and minimize discomfort.  Discussed plan of care with family at bedside and also the Critical care team. Please re-consult if further assistance is needed.  Thank-you,  Julien Girt MSN, [place], [place], [place], [place]

## 2014-10-17 DIAGNOSIS — E119 Type 2 diabetes mellitus without complications: Secondary | ICD-10-CM

## 2014-10-17 DIAGNOSIS — R739 Hyperglycemia, unspecified: Secondary | ICD-10-CM

## 2014-10-17 DIAGNOSIS — S31000D Unspecified open wound of lower back and pelvis without penetration into retroperitoneum, subsequent encounter: Secondary | ICD-10-CM

## 2014-10-17 LAB — CBC
HCT: 37.5 % (ref 36.0–46.0)
HEMOGLOBIN: 11.6 g/dL — AB (ref 12.0–15.0)
MCH: 31.4 pg (ref 26.0–34.0)
MCHC: 30.9 g/dL (ref 30.0–36.0)
MCV: 101.6 fL — AB (ref 78.0–100.0)
PLATELETS: 96 10*3/uL — AB (ref 150–400)
RBC: 3.69 MIL/uL — ABNORMAL LOW (ref 3.87–5.11)
RDW: 16.3 % — ABNORMAL HIGH (ref 11.5–15.5)
WBC: 8.4 10*3/uL (ref 4.0–10.5)

## 2014-10-17 LAB — BASIC METABOLIC PANEL
Anion gap: 9 (ref 5–15)
BUN: 34 mg/dL — AB (ref 6–23)
CALCIUM: 7.7 mg/dL — AB (ref 8.4–10.5)
CO2: 23 mmol/L (ref 19–32)
Chloride: 124 mEq/L — ABNORMAL HIGH (ref 96–112)
Creatinine, Ser: 0.62 mg/dL (ref 0.50–1.10)
GFR calc Af Amer: >90 mL/min (ref >90)
GFR, EST NON AFRICAN AMERICAN: 79 mL/min — AB (ref >90)
GLUCOSE: 165 mg/dL — AB (ref 70–99)
Potassium: 3.9 mmol/L (ref 3.5–5.1)
Sodium: 156 mmol/L — ABNORMAL HIGH (ref 135–145)

## 2014-10-17 LAB — GLUCOSE, CAPILLARY
GLUCOSE-CAPILLARY: 141 mg/dL — AB (ref 70–99)
GLUCOSE-CAPILLARY: 152 mg/dL — AB (ref 70–99)
GLUCOSE-CAPILLARY: 243 mg/dL — AB (ref 70–99)
GLUCOSE-CAPILLARY: 213 mg/dL — AB (ref 70–99)
GLUCOSE-CAPILLARY: 142 mg/dL — AB (ref 70–99)
Glucose-Capillary: 112 mg/dL — ABNORMAL HIGH (ref 70–99)
Glucose-Capillary: 114 mg/dL — ABNORMAL HIGH (ref 70–99)
Glucose-Capillary: 119 mg/dL — ABNORMAL HIGH (ref 70–99)
Glucose-Capillary: 119 mg/dL — ABNORMAL HIGH (ref 70–99)
Glucose-Capillary: 151 mg/dL — ABNORMAL HIGH (ref 70–99)
Glucose-Capillary: 166 mg/dL — ABNORMAL HIGH (ref 70–99)
Glucose-Capillary: 201 mg/dL — ABNORMAL HIGH (ref 70–99)
Glucose-Capillary: 201 mg/dL — ABNORMAL HIGH (ref 70–99)
Glucose-Capillary: 221 mg/dL — ABNORMAL HIGH (ref 70–99)
Glucose-Capillary: 222 mg/dL — ABNORMAL HIGH (ref 70–99)
Glucose-Capillary: 283 mg/dL — ABNORMAL HIGH (ref 70–99)
Glucose-Capillary: 99 mg/dL (ref 70–99)

## 2014-10-17 LAB — MAGNESIUM: MAGNESIUM: 2.3 mg/dL (ref 1.5–2.5)

## 2014-10-17 LAB — URINE CULTURE
COLONY COUNT: NO GROWTH
Culture: NO GROWTH

## 2014-10-17 LAB — PHOSPHORUS: Phosphorus: 3 mg/dL (ref 2.3–4.6)

## 2014-10-17 MED ORDER — PREDNISONE 20 MG PO TABS
20.0000 mg | ORAL_TABLET | Freq: Every day | ORAL | Status: DC
  Administered 2014-10-18 – 2014-10-22 (×5): 20 mg
  Filled 2014-10-17 (×6): qty 1

## 2014-10-17 MED ORDER — INSULIN ASPART 100 UNIT/ML ~~LOC~~ SOLN
2.0000 [IU] | SUBCUTANEOUS | Status: DC
  Administered 2014-10-17 (×2): 6 [IU] via SUBCUTANEOUS
  Administered 2014-10-17: 2 [IU] via SUBCUTANEOUS
  Administered 2014-10-17: 6 [IU] via SUBCUTANEOUS
  Administered 2014-10-18: 4 [IU] via SUBCUTANEOUS
  Administered 2014-10-18 (×2): 6 [IU] via SUBCUTANEOUS
  Administered 2014-10-18: 2 [IU] via SUBCUTANEOUS

## 2014-10-17 MED ORDER — VITAL HIGH PROTEIN PO LIQD
1000.0000 mL | ORAL | Status: DC
  Filled 2014-10-17: qty 1000

## 2014-10-17 MED ORDER — FREE WATER
200.0000 mL | Freq: Four times a day (QID) | Status: DC
  Administered 2014-10-17 – 2014-10-26 (×33): 200 mL

## 2014-10-17 MED ORDER — METOPROLOL TARTRATE 25 MG/10 ML ORAL SUSPENSION
6.2500 mg | Freq: Two times a day (BID) | ORAL | Status: DC
  Administered 2014-10-17 – 2014-10-26 (×16): 6.25 mg
  Filled 2014-10-17 (×20): qty 2.5

## 2014-10-17 MED ORDER — PERATIVE PO LIQD
120.0000 mL | Freq: Every day | ORAL | Status: DC
  Administered 2014-10-17 – 2014-10-18 (×7): 120 mL via ORAL
  Administered 2014-10-19: 14:00:00 via ORAL
  Administered 2014-10-19 – 2014-10-21 (×11): 120 mL via ORAL

## 2014-10-17 MED ORDER — DEXTROSE 10 % IV SOLN: INTRAVENOUS | Status: DC | PRN

## 2014-10-17 MED ORDER — INSULIN GLARGINE 100 UNIT/ML ~~LOC~~ SOLN
5.0000 [IU] | SUBCUTANEOUS | Status: DC
Start: 2014-10-17 — End: 2014-10-18
  Administered 2014-10-17 – 2014-10-18 (×2): 5 [IU] via SUBCUTANEOUS
  Filled 2014-10-17 (×2): qty 0.05

## 2014-10-17 NOTE — Progress Notes (Signed)
PAtient on IV insuling gtt glucostablizer 2 x recent cbg < 125  Plan Transition to phase 3 std ssi  Dr. Brand Males, M.D., [facility].C.P Pulmonary and Critical Care Medicine Staff Physician [facility] Pager: [phone], If no answer or between  15:00h - 7:00h: call [phone]  10/17/2014 2:44 AM

## 2014-10-17 NOTE — Progress Notes (Signed)
INITIAL NUTRITION ASSESSMENT  DOCUMENTATION CODES Per approved criteria  -Severe malnutrition in the context of chronic illness  Pt meets criteria for severe MALNUTRITION in the context of chronic illness as evidenced by 19% weight loss x 3 months, energy intake <75% for >/=1 month, severe muscle and fat depletion and fluid accumulation.  INTERVENTION: -Initiate Perative via PEG, 120 ml (1/2 can) five times daily (6 am, 10 am, 2 pm, 6 pm, 10 pm) -Will advance TF as tolerated   -Tube feeding regimen provides 770 kcal (48% of needs), 40 grams of protein, and 468 ml of H2O. -RD to follow-up daily   NUTRITION DIAGNOSIS: Inadequate oral intake related to inability to eat as evidenced by NPO status.   Goal: Pt to meet >/= 90% of their estimated nutrition needs   Monitor:  TF regimen & tolerance, weight, labs, I/O's  Reason for Assessment: Consult for TF initiation and management  Admitting Dx: sepsis (urinary + wounds) & hyperglycemia  ASSESSMENT: 81 y.o female who on 1/19  presented to the [facility] emergency room via EMS with progressive confusion, lethargy and tachypnea.  Pt familiar to this RD from previous admissions (07/30/14-08/10/14, 08/25/14-09/05/14).  Pt was admitted with CBGs in the 700s, HgbA1c of 8.2 and pt has been experiencing some diarrhea which stopped when TF was held.  Per granddaughter, pt on home TF regimen, Perative formula, bolus feeds of 8 oz at 6am, then 7 oz at 10am, 2 pm, 6 pm, and 10 pm. This provides. 1388 kcal, 72g protein and 843 ml of H2O. Family would prefer to continue with the same formula as they saw less diarrhea and side effects with this formula vs. Jevity 1.2.  Spoke with pharmacy about ordering this product, per policy it should be provided within 72 hours. Family has opted to provide supply of Perative formula from home. RD to order TF initiation and TF to start when family supplies formula.  Nutrition Focused Physical Exam:  Subcutaneous  Fat:  Orbital Region: mild depletion Upper Arm Region: severe depletion Thoracic and Lumbar Region: NA  Muscle:  Temple Region: severe depletion Clavicle Bone Region: moderate depletion Clavicle and Acromion Bone Region: moderate depletion Scapular Bone Region: NA Dorsal Hand: severe depletion Patellar Region: NA Anterior Thigh Region: NA Posterior Calf Region: NA  Edema: +2 RLE, +2 LLE  edema  Labs reviewed: Elevated Na, BUN Mg/Phos/K WNL Glucose 165  Height: Ht Readings from Last 1 Encounters:  10/16/14 5\' 2"  (1.575 m)    Weight: Wt Readings from Last 1 Encounters:  10/16/14 115 lb (52.164 kg)    Ideal Body Weight: 110 lb  % Ideal Body Weight: 105%  Wt Readings from Last 10 Encounters:  10/16/14 115 lb (52.164 kg)  10/15/14 115 lb 1.6 oz (52.209 kg)  10/01/14 128 lb 6.4 oz (58.242 kg)  09/26/14 129 lb (58.514 kg)  09/13/14 137 lb 9.6 oz (62.415 kg)  09/12/14 137 lb 9.6 oz (62.415 kg)  09/05/14 138 lb (62.596 kg)  08/14/14 141 lb (63.957 kg)  08/10/14 131 lb (59.421 kg)  07/24/14 142 lb 3.2 oz (64.501 kg)    Usual Body Weight: 140 lb  % Usual Body Weight: 82%  BMI:  Body mass index is 21.03 kg/(m^2).  Estimated Nutritional Needs: Kcal: 1600-1800 Protein: 85-95g Fluid: 1.6L/day  Skin: gangrenous toes, stage 3 sacral pressure ulcer, +2 RLE, +2 LLE    Diet Order: Diet NPO time specified  EDUCATION NEEDS: -Education needs addressed   Intake/Output Summary (Last 24 hours) at  10/17/14 1111 Last data filed at 10/17/14 1031  Gross per 24 hour  Intake 2695.4 ml  Output   1120 ml  Net 1575.4 ml    Last BM: 1/19  Labs:   Recent Labs Lab 10/16/14 0756 10/17/14 0409  NA 151*  154* 156*  K 4.8  4.0 3.9  CL 115*  116* 124*  CO2 23 23  BUN 54*  49* 34*  CREATININE 1.17*  0.90 0.62  CALCIUM 8.9 7.7*  MG  --  2.3  PHOS  --  3.0  GLUCOSE 735*  >700* 165*    CBG (last 3)   Recent Labs  10/17/14 0022 10/17/14 0131 10/17/14 0236   GLUCAP 119* 114* 112*    Scheduled Meds: . aspirin  81 mg Per Tube Daily  . aztreonam  1 g Intravenous Q8H  . collagenase   Topical Daily  . folic acid  1 mg Per Tube Daily  . free water  200 mL Per Tube Q6H  . heparin  5,000 Units Subcutaneous 3 times per day  . hydrocortisone sod succinate (SOLU-CORTEF) inj  50 mg Intravenous Q8H  . insulin aspart  2-6 Units Subcutaneous 6 times per day  . insulin glargine  5 Units Subcutaneous Q24H  . lidocaine  1 application Topical Daily  . metoprolol tartrate  6.25 mg Per Tube BID  . metronidazole  500 mg Intravenous Q8H  . [START ON 10/18/2014] predniSONE  20 mg Per Tube Daily  . saccharomyces boulardii  250 mg Oral BID  . vancomycin  750 mg Intravenous Q24H    Continuous Infusions: . dextrose    . dextrose 5 % and 0.45% NaCl Stopped (10/17/14 0730)    Past Medical History  Diagnosis Date  . Arthritis   . Polymyalgia   . Acid reflux   . Immune deficiency disorder     autoimmune disorder - polymyalgia - on prednisone  . Breast cancer     Treated with lumpectomy and radiation  . Giardia   . Pinworms   . History of hiatal hernia   . Pneumonia   . Dementia   . CHF (congestive heart failure)   . Dysrhythmia     Past Surgical History  Procedure Laterality Date  . Breast lumpectomy    . Partial hysterectomy    . Tonsillectomy    . Tee without cardioversion N/A 07/20/2014    Procedure: TRANSESOPHAGEAL ECHOCARDIOGRAM (TEE);  Surgeon: Candee Furbish, MD;  Location: [facility] ENDOSCOPY;  Service: Cardiovascular;  Laterality: N/A;  . Abdominal hysterectomy      Clayton Bibles, MS, RD, LDN Pager: [phone] After Hours Pager: [phone]

## 2014-10-17 NOTE — Progress Notes (Signed)
Subjective: Family is in the room, and I discussed lack of findings on the CT scan.  She had a difficult night, and is resting,she did not wake when I came in.  VSS.    Objective: Vital signs in last 24 hours: Temp:  [97.3 F (36.3 C)-98.5 F (36.9 C)] 97.3 F (36.3 C) (01/20 0620) Pulse Rate:  [84-95] 95 (01/20 0700) Resp:  [19-31] 20 (01/20 0700) BP: (92-141)/(48-76) 134/69 mmHg (01/20 0700) SpO2:  [94 %-100 %] 94 % (01/20 0700) Last BM Date: 10/16/14 Afebrile, VSS Na 156 CL 124, Glucose 165 WBC is normal,  H/H is down, Platelets are down. CT scan abd/pelvis yesterday:   Decubitus ulcers, the deepest in the gluteal cleft. No abscess, undermining, or osteomyelitis identified Intake/Output from previous day: 01/19 0701 - 01/20 0700 In: 2249.6 [I.V.:1349.6; IV Piggyback:900] Out: 1120 [Urine:1120] Intake/Output this shift:    General appearance: sleeping, and I did not wake her.  Lab Results:   Recent Labs  10/16/14 0756 10/17/14 0409  WBC 8.9 8.4  HGB 15.5*  17.0* 11.6*  HCT 50.6*  50.0* 37.5  PLT 137* 96*    BMET  Recent Labs  10/16/14 0756 10/17/14 0409  NA 151*  154* 156*  K 4.8  4.0 3.9  CL 115*  116* 124*  CO2 23 23  GLUCOSE 735*  >700* 165*  BUN 54*  49* 34*  CREATININE 1.17*  0.90 0.62  CALCIUM 8.9 7.7*   PT/INR No results for input(s): LABPROT, INR in the last 72 hours.   Recent Labs Lab 10/16/14 0756  AST 36  ALT 22  ALKPHOS 127*  BILITOT 1.1  PROT 6.1  ALBUMIN 2.9*     Lipase  No results found for: LIPASE   Studies/Results: Ct Abdomen Pelvis Wo Contrast  10/16/2014   CLINICAL DATA:  Decubitus ulcer  EXAM: CT ABDOMEN AND PELVIS WITHOUT CONTRAST  TECHNIQUE: Multidetector CT imaging of the abdomen and pelvis was performed following the standard protocol without IV contrast.  COMPARISON:  05/21/2012  FINDINGS: BODY WALL:  There is ulceration of the mid gluteal cleft with granulation tissue or other soft tissue extends to  contact the sacrum, appearance similar to previous. There are no sacral changes of osteomyelitis. Fissured appearance of the lower gluteal cleft, extending towards the anus. There is skin thickening and subcutaneous reticulation in the lower left gluteal region which likely also represents pressure changes. No evidence of tract.  Lumpectomy in the lower right breast. Surgical size stable from prior.  LOWER CHEST: There is a moderate sliding-type hiatal hernia. Detection of esophageal or gastric thickening not possible due to under distention and lack of IV contrast.  Bulky mitral annular calcification noted. Chronic subpleural reticulation in the lower lungs.  ABDOMEN/PELVIS:  Liver: Small cysts again noted in the liver.  Biliary: Cholelithiasis. Gallbladder is full but there is no inflammatory change.  Pancreas: Unremarkable.  Spleen: Unremarkable.  Adrenals: Unremarkable.  Kidneys and ureters: 2 small high-density areas within the lower right kidney likely reflects small hemorrhagic cyst. Small low-density foci in the more cranial right renal cortex is likely also cysts.  Bladder: Decompressed by Foley catheter.  Reproductive: Hysterectomy. Decreased size of a cyst in the right hemipelvis, currently 4.8 cm previously 6.4 cm. As noted previously this could be an ovarian or peritoneal cyst.  Bowel: Sliding hiatal hernia. There is percutaneous gastrostomy tube which is in good position. No pericecal inflammation.  Retroperitoneum: No mass or adenopathy.  Peritoneum: No ascites or pneumoperitoneum.  Vascular: No acute abnormality.  OSSEOUS: Healed insufficiency fractures of the right obturator ring. Diffuse and advanced lumbar degenerative disc disease with levoscoliosis. No acute fracture.  IMPRESSION: 1. Decubitus ulcers, the deepest in the gluteal cleft. No abscess, undermining, or osteomyelitis identified. 2. Chronic findings are stable from 2013 and noted above.   Electronically Signed   By: Jorje Guild M.D.    On: 10/16/2014 22:03   Dg Chest Port 1 View  10/16/2014   CLINICAL DATA:  Shortness of breath and lethargy  EXAM: PORTABLE CHEST - 1 VIEW  COMPARISON:  September 26, 2014  FINDINGS: There is no edema or consolidation. Heart size and pulmonary vascularity are normal. No adenopathy. There is calcification in the mitral annulus. There is degenerative change in the lower thoracic and upper lumbar region with upper lumbar levoscoliosis.  IMPRESSION: No edema or consolidation.   Electronically Signed   By: Lowella Grip M.D.   On: 10/16/2014 08:16    Medications: . aspirin  324 mg Oral NOW   Or  . aspirin  300 mg Rectal NOW  . aspirin  81 mg Per Tube Daily  . aztreonam  1 g Intravenous Q8H  . collagenase   Topical Daily  . folic acid  1 mg Per Tube Daily  . heparin  5,000 Units Subcutaneous 3 times per day  . hydrocortisone sod succinate (SOLU-CORTEF) inj  50 mg Intravenous Q8H  . insulin aspart  2-6 Units Subcutaneous 6 times per day  . insulin glargine  5 Units Subcutaneous Q24H  . lidocaine  1 application Topical Daily  . metronidazole  500 mg Intravenous Q8H  . saccharomyces boulardii  250 mg Oral BID  . vancomycin  750 mg Intravenous Q24H    Assessment/Plan 1. Sepsis 2. Non healing decubitus, sacrum and rectum to vagina 3. Chronic incontinence on tube feedings 4. Polymyalgia on chronic steroids 5. Chronic immune deficiency disorder 6. Dementia 7. Deconditioning and malnutrition/currently bed bound 8. Aortic and mitral stenosis 9. Hx of CHF 10. Hiatal hernia   Plan:  Unfortunately we do not have any surgical solution to her ulcers.  She does not have osteomyelitis on CT.  We would recommend local wound care as she is currently receiving.  One daughter ask about Hyperbaric treatment, and I reported we do not have experience with this mode of treatment.  We will see again as needed, please call if we can help.   LOS: 1 day    Karlene Southard 10/17/2014

## 2014-10-17 NOTE — Progress Notes (Signed)
[facility]  Patient Status: Active (receiving services up to time of hospitalization)  [facility] is providing the following services: RN, PT, OT and ST   (This is a [facility] patient)  If patient discharges after hours, please call [phone].   Kristen Hayworth 10/17/2014, 10:05 AM

## 2014-10-17 NOTE — Progress Notes (Signed)
PULMONARY / CRITICAL CARE MEDICINE   Name: Martha Mcbride MRN: [id number] DOB: 05-01-1925    ADMISSION DATE:  10/16/2014  REFERRING MD :  Dr. Betsey Holiday / EDP   CHIEF COMPLAINT:  Sepsis   INITIAL PRESENTATION: 81 y/o F admitted with sepsis (urinary + wounds) & hyperglycemia.    STUDIES:    SIGNIFICANT EVENTS: 1/19  Admit with concerns for sepsis (urinary & wounds as source), hyperglycemia 1/20  Off insulin gtt, significant clinical improvement in mental status   SUBJECTIVE:  RN reports pt off insulin gtt.  Improved mental status.  Stress dose steroids initiated overnight.  Family asking about hyperbaric chamber for patients wounds and restarting of feeding.  No diarrhea since admission (TF held)   VITAL SIGNS: Temp:  [97.3 F (36.3 C)-98.5 F (36.9 C)] 97.4 F (36.3 C) (01/20 0800) Pulse Rate:  [84-95] 94 (01/20 0800) Resp:  [19-31] 22 (01/20 0800) BP: (92-141)/(48-76) 140/67 mmHg (01/20 0800) SpO2:  [94 %-100 %] 97 % (01/20 0800)    INTAKE / OUTPUT:  Intake/Output Summary (Last 24 hours) at 10/17/14 0929 Last data filed at 10/17/14 0800  Gross per 24 hour  Intake 2495.4 ml  Output   1120 ml  Net 1375.4 ml    PHYSICAL EXAMINATION: General:  Frail elderly female, awake/pleasant  Neuro:  Awake/alert, oriented to self, place & events, MAE, generalized weakness  HEENT:  Mm pink/dry, no jvd, dentures in place  Cardiovascular:  s1s2 rrr, 3/6 SEM Lungs:  Even/non-labored,  lungs bilaterally with fine basilar crackles Abdomen:  PEG c/d/i, NTND Musculoskeletal:  No acute deformities  Skin:  Warm/dry, good cap refills, 1+ pitting edema in BLE, sacral/rectal wound erythematous, ~ stage 3 with rectal excoriation with yellow/brown crusting & drainage,  deep fissure like open wound approx 3 inches in length in intergluteal cleft   LABS:  CBC  Recent Labs Lab 10/16/14 0756 10/17/14 0409  WBC 8.9 8.4  HGB 15.5*  17.0* 11.6*  HCT 50.6*  50.0* 37.5  PLT 137* 96*    BMET  Recent Labs Lab 10/16/14 0756 10/17/14 0409  NA 151*  154* 156*  K 4.8  4.0 3.9  CL 115*  116* 124*  CO2 23 23  BUN 54*  49* 34*  CREATININE 1.17*  0.90 0.62  GLUCOSE 735*  >700* 165*   Electrolytes  Recent Labs Lab 10/16/14 0756 10/17/14 0409  CALCIUM 8.9 7.7*  MG  --  2.3  PHOS  --  3.0   Sepsis Markers  Recent Labs Lab 10/16/14 0748 10/16/14 1031 10/16/14 1045  LATICACIDVEN 6.69* 5.2* 5.35*   Liver Enzymes  Recent Labs Lab 10/16/14 0756  AST 36  ALT 22  ALKPHOS 127*  BILITOT 1.1  ALBUMIN 2.9*   Cardiac Enzymes  Recent Labs Lab 10/16/14 0756  TROPONINI 0.03   Glucose  Recent Labs Lab 10/16/14 2103 10/16/14 2213 10/16/14 2320 10/17/14 0022 10/17/14 0131 10/17/14 0236  GLUCAP 243* 213* 166* 119* 114* 112*    Imaging Ct Abdomen Pelvis Wo Contrast  10/16/2014   CLINICAL DATA:  Decubitus ulcer  EXAM: CT ABDOMEN AND PELVIS WITHOUT CONTRAST  TECHNIQUE: Multidetector CT imaging of the abdomen and pelvis was performed following the standard protocol without IV contrast.  COMPARISON:  05/21/2012  FINDINGS: BODY WALL:  There is ulceration of the mid gluteal cleft with granulation tissue or other soft tissue extends to contact the sacrum, appearance similar to previous. There are no sacral changes of osteomyelitis. Fissured appearance of the lower gluteal  cleft, extending towards the anus. There is skin thickening and subcutaneous reticulation in the lower left gluteal region which likely also represents pressure changes. No evidence of tract.  Lumpectomy in the lower right breast. Surgical size stable from prior.  LOWER CHEST: There is a moderate sliding-type hiatal hernia. Detection of esophageal or gastric thickening not possible due to under distention and lack of IV contrast.  Bulky mitral annular calcification noted. Chronic subpleural reticulation in the lower lungs.  ABDOMEN/PELVIS:  Liver: Small cysts again noted in the liver.  Biliary:  Cholelithiasis. Gallbladder is full but there is no inflammatory change.  Pancreas: Unremarkable.  Spleen: Unremarkable.  Adrenals: Unremarkable.  Kidneys and ureters: 2 small high-density areas within the lower right kidney likely reflects small hemorrhagic cyst. Small low-density foci in the more cranial right renal cortex is likely also cysts.  Bladder: Decompressed by Foley catheter.  Reproductive: Hysterectomy. Decreased size of a cyst in the right hemipelvis, currently 4.8 cm previously 6.4 cm. As noted previously this could be an ovarian or peritoneal cyst.  Bowel: Sliding hiatal hernia. There is percutaneous gastrostomy tube which is in good position. No pericecal inflammation.  Retroperitoneum: No mass or adenopathy.  Peritoneum: No ascites or pneumoperitoneum.  Vascular: No acute abnormality.  OSSEOUS: Healed insufficiency fractures of the right obturator ring. Diffuse and advanced lumbar degenerative disc disease with levoscoliosis. No acute fracture.  IMPRESSION: 1. Decubitus ulcers, the deepest in the gluteal cleft. No abscess, undermining, or osteomyelitis identified. 2. Chronic findings are stable from 2013 and noted above.   Electronically Signed   By: Jorje Guild M.D.   On: 10/16/2014 22:03   Dg Chest Port 1 View  10/16/2014   CLINICAL DATA:  Shortness of breath and lethargy  EXAM: PORTABLE CHEST - 1 VIEW  COMPARISON:  September 26, 2014  FINDINGS: There is no edema or consolidation. Heart size and pulmonary vascularity are normal. No adenopathy. There is calcification in the mitral annulus. There is degenerative change in the lower thoracic and upper lumbar region with upper lumbar levoscoliosis.  IMPRESSION: No edema or consolidation.   Electronically Signed   By: Lowella Grip M.D.   On: 10/16/2014 08:16     ASSESSMENT / PLAN:  PULMONARY OETT A: ILD - presumed IPF.  CXR clear on admission  Aspiration Risk  P:   Pulmonary hygiene Upright positioning, HOB @ 30  degrees  CARDIOVASCULAR CVL A:  Sepsis - concern for urinary / wound source  Chronic dCHF Hx HTN Known Murmur - AS - moderate on TEE 07/20/14 & calcified MV P:  Hold baseline lasix (20 mg BID) 1/19, consider restart in am 1/21 Resume lopressor (6.25 BID) SDU monitoring  See ID  Stress steroids until 1/20 pm, then resume prednisone at maintenence dosing am 1/21  RENAL A:   At Risk AKI - in setting of sepsis  Hypernatremia  Hyperchloremia  P:   Monitor BMP Replace electrolytes as indicated   GASTROINTESTINAL A:   Severe Dysphagia - s/p PEG, NPO at baseline (since late fall 2015).  In setting of large hiatal hernia Diarrhea - rule out infectious etiology  P:   NPO  Resume PEG tube feeding Hold home imodium Continue florastor  See ID  HEMATOLOGIC A:   Thrombocytopenia - likely in setting of sepsis  P:  Trend CBC Heparin for DVT prophylaxis, monitor platelets closely   INFECTIOUS A:   Sacral Decubitus - advanced wound since prior admissions ? UTI  P:   BCx2 1/19 >>  UC  1/19 >>  C-Diff 1/19 >>  Norovirus 1/19 >>   Aztreonam, start date 1/19, day 2/x Flagyl, start date 1/19, day 2/x Vanco, start date 1/19, day 2/x  WOC consult Consider narrowing abx CCS consult appreciated, continue wound care per [facility] & follow up with outpatient wound clinic.  No need for I&D.  CT negative for abscess.   ENDOCRINE A:   Hyperglycemia  - no Hx of DM, ? If home formula of TF concentration of glucose, prednisone + sepsis / stress response.  Hgb A1c 8.2 P:   SSI  Lantus 5 units QD, monitor glucose closely  Cautious administration of fluids with hx of dCHF   NEUROLOGIC A:   Acute Encephalopathy - resolved.  Arthritis  Polymyalgia Rheumatica  Dementia  P:   Steroids as above Monitor mental status, supportive care    FAMILY  - Updates: Granddaughter updated at bedside.      Noe Gens, NP-C [facility] Pgr: [phone] or  [phone]   10/17/2014, 9:29 AM  Attending:  I have seen and examined the patient with nurse practitioner/resident and agree with the note above.   Ms Savino is more awake and alert today, her blood sugar has improved and her A1C was noted to be 8.2 so apparently she has developed diabetes in the last few months which is a new diagnosis for her.  Surgery consult appreciated, CT results reviewed. Family interested in hyperbaric therapy for wounds.  I explained to her granddaughter today that this can help with wounds but she needs to receive hydration today and she needs to be tolerating nutrition and narrow abx before we would consider that.  Further, this would need to be offered as an outpatient center.  Will discuss with the wound team.  Maintain in SDU, floor tomorrow  Roselie Awkward, MD [facility] Pager: [phone] Cell: [phone] If no response, call [phone]

## 2014-10-18 DIAGNOSIS — L8990 Pressure ulcer of unspecified site, unspecified stage: Secondary | ICD-10-CM | POA: Insufficient documentation

## 2014-10-18 DIAGNOSIS — E43 Unspecified severe protein-calorie malnutrition: Secondary | ICD-10-CM

## 2014-10-18 DIAGNOSIS — E1100 Type 2 diabetes mellitus with hyperosmolarity without nonketotic hyperglycemic-hyperosmolar coma (NKHHC): Secondary | ICD-10-CM

## 2014-10-18 LAB — CLOSTRIDIUM DIFFICILE BY PCR: Toxigenic C. Difficile by PCR: NEGATIVE

## 2014-10-18 LAB — GLUCOSE, CAPILLARY
GLUCOSE-CAPILLARY: 139 mg/dL — AB (ref 70–99)
GLUCOSE-CAPILLARY: 287 mg/dL — AB (ref 70–99)
Glucose-Capillary: 173 mg/dL — ABNORMAL HIGH (ref 70–99)
Glucose-Capillary: 203 mg/dL — ABNORMAL HIGH (ref 70–99)
Glucose-Capillary: 204 mg/dL — ABNORMAL HIGH (ref 70–99)
Glucose-Capillary: 264 mg/dL — ABNORMAL HIGH (ref 70–99)

## 2014-10-18 LAB — CBC
HCT: 38.1 % (ref 36.0–46.0)
Hemoglobin: 11.6 g/dL — ABNORMAL LOW (ref 12.0–15.0)
MCH: 31.4 pg (ref 26.0–34.0)
MCHC: 30.4 g/dL (ref 30.0–36.0)
MCV: 103 fL — ABNORMAL HIGH (ref 78.0–100.0)
PLATELETS: 83 10*3/uL — AB (ref 150–400)
RBC: 3.7 MIL/uL — ABNORMAL LOW (ref 3.87–5.11)
RDW: 16.5 % — ABNORMAL HIGH (ref 11.5–15.5)
WBC: 7.7 10*3/uL (ref 4.0–10.5)

## 2014-10-18 LAB — BASIC METABOLIC PANEL
Anion gap: 6 (ref 5–15)
BUN: 28 mg/dL — AB (ref 6–23)
CHLORIDE: 119 meq/L — AB (ref 96–112)
CO2: 23 mmol/L (ref 19–32)
Calcium: 7.5 mg/dL — ABNORMAL LOW (ref 8.4–10.5)
Creatinine, Ser: 0.62 mg/dL (ref 0.50–1.10)
GFR calc non Af Amer: 79 mL/min — ABNORMAL LOW (ref >90)
Glucose, Bld: 140 mg/dL — ABNORMAL HIGH (ref 70–99)
POTASSIUM: 3 mmol/L — AB (ref 3.5–5.1)
SODIUM: 148 mmol/L — AB (ref 135–145)

## 2014-10-18 LAB — WOUND CULTURE

## 2014-10-18 MED ORDER — ENOXAPARIN SODIUM 30 MG/0.3ML ~~LOC~~ SOLN
30.0000 mg | SUBCUTANEOUS | Status: DC
  Administered 2014-10-18 – 2014-10-20 (×3): 30 mg via SUBCUTANEOUS
  Filled 2014-10-18 (×3): qty 0.3

## 2014-10-18 MED ORDER — GLYBURIDE 2.5 MG PO TABS
2.5000 mg | ORAL_TABLET | Freq: Every day | ORAL | Status: DC
  Administered 2014-10-18 – 2014-10-20 (×3): 2.5 mg via ORAL
  Filled 2014-10-18 (×4): qty 1

## 2014-10-18 MED ORDER — FUROSEMIDE 8 MG/ML PO SOLN
20.0000 mg | Freq: Two times a day (BID) | ORAL | Status: DC
  Administered 2014-10-18 – 2014-10-22 (×9): 20 mg
  Filled 2014-10-18 (×12): qty 5

## 2014-10-18 MED ORDER — POTASSIUM CHLORIDE 20 MEQ/15ML (10%) PO SOLN
20.0000 meq | Freq: Every day | ORAL | Status: DC
  Administered 2014-10-19 – 2014-10-26 (×8): 20 meq
  Filled 2014-10-18 (×8): qty 15

## 2014-10-18 MED ORDER — LIVING WELL WITH DIABETES BOOK
Freq: Once | Status: AC
  Administered 2014-10-18: 09:00:00
  Filled 2014-10-18: qty 1

## 2014-10-18 MED ORDER — FUROSEMIDE 10 MG/ML PO SOLN
20.0000 mg | Freq: Two times a day (BID) | ORAL | Status: DC
  Administered 2014-10-18: 20 mg
  Filled 2014-10-18 (×3): qty 2

## 2014-10-18 MED ORDER — INSULIN GLARGINE 100 UNIT/ML ~~LOC~~ SOLN
5.0000 [IU] | Freq: Every day | SUBCUTANEOUS | Status: DC
  Administered 2014-10-18 – 2014-10-19 (×2): 5 [IU] via SUBCUTANEOUS
  Filled 2014-10-18 (×2): qty 0.05

## 2014-10-18 MED ORDER — RANITIDINE HCL 150 MG/10ML PO SYRP
150.0000 mg | ORAL_SOLUTION | Freq: Every day | ORAL | Status: DC
  Administered 2014-10-18 – 2014-10-25 (×8): 150 mg
  Filled 2014-10-18 (×9): qty 10

## 2014-10-18 MED ORDER — POTASSIUM CHLORIDE 20 MEQ/15ML (10%) PO SOLN: 20.0000 meq | Freq: Every day | ORAL | Status: DC

## 2014-10-18 MED ORDER — POTASSIUM CHLORIDE 20 MEQ/15ML (10%) PO SOLN
40.0000 meq | Freq: Two times a day (BID) | ORAL | Status: AC
  Administered 2014-10-18 (×2): 40 meq
  Filled 2014-10-18 (×2): qty 30

## 2014-10-18 MED ORDER — INSULIN ASPART 100 UNIT/ML ~~LOC~~ SOLN
0.0000 [IU] | SUBCUTANEOUS | Status: DC
  Administered 2014-10-18 (×2): 11 [IU] via SUBCUTANEOUS
  Administered 2014-10-19: 3 [IU] via SUBCUTANEOUS
  Administered 2014-10-19: 4 [IU] via SUBCUTANEOUS

## 2014-10-18 NOTE — Consult Note (Addendum)
Wound care follow-up: Discussed possible hyperbaric therapy via phone with Noe Gens, NP.  This is beyond [facility] team scope of practice and is performed at the outpatient wound care center at [facility]: it is only appropriate for certain categories of wounds to promote healing. Discussed plan of care with the PA; please refer to the outpatient wound care center for further questions regarding this treatment. Please re-consult if further assistance is needed.  Thank-you,  Julien Girt MSN, [place], [place], [place], [place]

## 2014-10-18 NOTE — Progress Notes (Signed)
While turning patient, noted small incontinent stool. A sample was unable to be obtained due to the necessary placement of the moist to dry dressing. The patients rectal and perineal areas were cleaned, the decubitus ulcers rinsed with normal saline and the area patted dry.  Lidocaine and Santyl were applied to the wound bed and a new moist to dry dressing was applied. Will attempt to obtain stool sample with next stool. Jacolyn Reedy, RN

## 2014-10-18 NOTE — Progress Notes (Signed)
NUTRITION FOLLOW-UP  INTERVENTION: -Continue Perative via PEG, 120 ml (1/2 can) five times daily (6 am, 10 am, 2 pm, 6 pm, 10 pm) -Will advance TF as tolerated   -Tube feeding regimen provides 770 kcal (48% of needs), 40 grams of protein, and 468 ml of H2O. -RD to follow-up daily   NUTRITION DIAGNOSIS: Inadequate oral intake related to inability to eat as evidenced by NPO status, ongoing.  Goal: Pt to meet >/= 90% of their estimated nutrition needs, unmet   Monitor:  TF regimen & tolerance, weight, labs, I/O's  ASSESSMENT: 81 y.o female who on 1/19  presented to the [facility] emergency room via EMS with progressive confusion, lethargy and tachypnea.  1/20: -Pt familiar to this RD from previous admissions (07/30/14-08/10/14, 08/25/14-09/05/14). -Pt was admitted with CBGs in the 700s, HgbA1c of 8.2 and pt has been experiencing some diarrhea which stopped when TF was held. -Per granddaughter, pt on home TF regimen, Perative formula, bolus feeds of 8 oz at 6am, then 7 oz at 10am, 2 pm, 6 pm, and 10 pm. This provides. 1388 kcal, 72g protein and 843 ml of H2O. Family would prefer to continue with the same formula as they saw less diarrhea and side effects with this formula vs. Jevity 1.2. -Spoke with pharmacy about ordering this product, per policy it should be provided within 72 hours. Family has opted to provide supply of Perative formula from home. RD to order TF initiation and TF to start when family supplies formula.  1/21 -Pt is tolerating 1/2 doses of Perative TF 5 times daily. -Will monitor tolerance a little longer and advance to home regimen. -Pt with some diarrhea since TF started, c diff pending. -Spoke with granddaughter at bedside, reports no issues currently. Has no questions at this time.  Labs reviewed: Elevated Na, BUN Mg/Phos WNL Low K CBGs 173 -203 mg/dL  Height: Ht Readings from Last 1 Encounters:  10/16/14 5\' 2"  (1.575 m)    Weight: Wt Readings from Last  1 Encounters:  10/18/14 115 lb 8.3 oz (52.4 kg)    BMI:  Body mass index is 21.12 kg/(m^2).  Estimated Nutritional Needs: Kcal: 1600-1800 Protein: 85-95g Fluid: 1.6L/day  Skin: gangrenous toes, stage 3 sacral pressure ulcer, +2 RLE, +2 LLE    Diet Order: Diet NPO time specified  EDUCATION NEEDS: -Education needs addressed   Intake/Output Summary (Last 24 hours) at 10/18/14 1240 Last data filed at 10/18/14 1046  Gross per 24 hour  Intake 1691.25 ml  Output    956 ml  Net 735.25 ml    Last BM: 1/19  Labs:   Recent Labs Lab 10/16/14 0756 10/17/14 0409 10/18/14 0620  NA 151*  154* 156* 148*  K 4.8  4.0 3.9 3.0*  CL 115*  116* 124* 119*  CO2 23 23 23   BUN 54*  49* 34* 28*  CREATININE 1.17*  0.90 0.62 0.62  CALCIUM 8.9 7.7* 7.5*  MG  --  2.3  --   PHOS  --  3.0  --   GLUCOSE 735*  >700* 165* 140*    CBG (last 3)   Recent Labs  10/18/14 0002 10/18/14 0903 10/18/14 1154  GLUCAP 173* 204* 203*    Scheduled Meds: . aspirin  81 mg Per Tube Daily  . aztreonam  1 g Intravenous Q8H  . collagenase   Topical Daily  . enoxaparin (LOVENOX) injection  30 mg Subcutaneous Q24H  . folic acid  1 mg Per Tube Daily  .  free water  200 mL Per Tube Q6H  . furosemide  20 mg Per Tube BID  . glyBURIDE  2.5 mg Oral Q breakfast  . insulin aspart  2-6 Units Subcutaneous 6 times per day  . insulin glargine  5 Units Subcutaneous Q24H  . lidocaine  1 application Topical Daily  . metoprolol tartrate  6.25 mg Per Tube BID  . metronidazole  500 mg Intravenous Q8H  . PERATIVE  120 mL Oral 5 X Daily  . [START ON 10/19/2014] potassium chloride  20 mEq Per Tube Daily  . potassium chloride  40 mEq Per Tube BID  . predniSONE  20 mg Per Tube Daily  . ranitidine  150 mg Per Tube QHS  . saccharomyces boulardii  250 mg Oral BID  . vancomycin  750 mg Intravenous Q24H    Continuous Infusions: . dextrose    . dextrose 5 % and 0.45% NaCl 75 mL/hr at 10/18/14 0300    Clayton Bibles, MS, RD, LDN Pager: [phone] After Hours Pager: [phone]

## 2014-10-18 NOTE — Progress Notes (Signed)
Inpatient Diabetes Program Recommendations  AACE/ADA: New Consensus Statement on Inpatient Glycemic Control (2013)  Target Ranges:  Prepandial:   less than 140 mg/dL      Peak postprandial:   less than 180 mg/dL (1-2 hours)      Critically ill patients:  140 - 180 mg/dL     Results for Martha Mcbride, Martha Mcbride (MRN [id number]) as of 10/18/2014 11:31  Ref. Range 10/17/2014 00:22 10/17/2014 01:31 10/17/2014 02:36 10/17/2014 03:54 10/17/2014 08:50 10/17/2014 12:22 10/17/2014 16:15 10/17/2014 19:59  Glucose-Capillary Latest Range: 70-99 mg/dL 119 (H) 114 (H) 112 (H) 142 (H) 201 (H) 222 (H) 99 221 (H)    Results for Martha Mcbride, Martha Mcbride (MRN [id number]) as of 10/18/2014 11:31  Ref. Range 10/18/2014 00:02 10/18/2014 09:03  Glucose-Capillary Latest Range: 70-99 mg/dL 173 (H) 204 (H)    81 y/o F admitted with sepsis (urinary + wounds) & hyperglycemia. New Dx DM.   Current Orders: Lantus 5 units Q24 hours      Novolog 2-4-6 Q4 hours per ICU Glycemic Control Protocol      Glyburide 2.5 mg daily ( to start today)   **Note patient to transfer to med-surg bed today.  **Novolog SSI to change to Resistant scale Q4 hours per Glycemic Control order set upon transfer to med-surg unit.  **Not sure if patient will need insulin at home.  Patient does get tube feed boluses at home which could lead to hyperglycemia.  Let's see how patient's glucose levels respond to tube feed boluses here in hospital to help better determine if she will need insulin at home.   MD- May want to consider changing Novolog SSI coverage to coincide with patient's Perative Tube Feed boluses (6am, 10am, 2pm, 6pm, 10pm)    Spoke with pt's granddaughter about new diagnosis.  Discussed A1C results with granddaughter and explained what an A1C is, basic pathophysiology of DM Type 2, basic home care, basic diabetes diet nutrition principles, importance of checking CBGs and maintaining good CBG control to prevent long-term and short-term complications.   Reviewed signs and symptoms of hyperglycemia and hypoglycemia and how to treat hypoglycemia at home (4oz of juice or soda through PEG tube).  Also reviewed blood sugar goals at home.  Encouraged granddaughter to check patient's CBGs 1-2 times per day if patient goes home on only oral DM medications (if once per day, vary the time everyday, if 2 times per day always 1st thing in the AM and vary the 2nd check of the day).  Encouraged granddaughter to keep a written record of CBGs and take that record to all MD appointments.  Patient's granddaughter told me that she and herself are very well educated in regards to DM care at home b/c her step-father was diagnosed with DM several years ago and now her step-father takes insulin.  Per granddaughter they will be comfortable giving insulin to patient if needed.  RNs to provide ongoing basic DM education at bedside with this patient and patient's family.  Have ordered educational booklet.     Will follow Wyn Quaker RN, MSN, CDE Diabetes Coordinator Inpatient Diabetes Program Team Pager: [phone] (8a-10p)

## 2014-10-18 NOTE — Plan of Care (Signed)
Problem: Phase II Progression Outcomes Goal: Obtain order to discontinue catheter if appropriate Outcome: Not Progressing Pt has stage 3 skin ulcers in perianal area.

## 2014-10-18 NOTE — Progress Notes (Signed)
PULMONARY / CRITICAL CARE MEDICINE   Name: Martha Mcbride MRN: [id number] DOB: 01-04-1925    ADMISSION DATE:  10/16/2014  REFERRING MD :  Dr. Betsey Holiday / EDP   CHIEF COMPLAINT:  Sepsis   INITIAL PRESENTATION: 81 y/o F admitted with sepsis (urinary + wounds) & hyperglycemia.  New Dx DM.  STUDIES:    SIGNIFICANT EVENTS: 1/19  Admit with concerns for sepsis (urinary & wounds as source), hyperglycemia 1/20  Off insulin gtt, significant clinical improvement in mental status 1/21  Tx to med floor   SUBJECTIVE:  RN reports diarrhea with TF restart.  Concerns for perpetual wound contamination.  No acute events.   VITAL SIGNS: Temp:  [97.7 F (36.5 C)-98.7 F (37.1 C)] 98.4 F (36.9 C) (01/21 0355) Pulse Rate:  [73-92] 77 (01/21 0800) Resp:  [16-29] 23 (01/21 0800) BP: (103-153)/(56-82) 129/67 mmHg (01/21 0800) SpO2:  [94 %-99 %] 97 % (01/21 0800) Weight:  [115 lb 8.3 oz (52.4 kg)] 115 lb 8.3 oz (52.4 kg) (01/21 0430)    INTAKE / OUTPUT:  Intake/Output Summary (Last 24 hours) at 10/18/14 0258 Last data filed at 10/18/14 0300  Gross per 24 hour  Intake 916.25 ml  Output    880 ml  Net  36.25 ml    PHYSICAL EXAMINATION: General:  Frail elderly female, awake/pleasant  Neuro:  Awake/alert, oriented to self, place & events, MAE, generalized weakness  HEENT:  Mm pink/dry, no jvd, dentures in place  Cardiovascular:  s1s2 rrr, 3/6 SEM Lungs:  Even/non-labored,  lungs bilaterally with fine basilar crackles Abdomen:  PEG c/d/i, NTND Musculoskeletal:  No acute deformities  Skin:  Warm/dry, good cap refills, 1+ pitting edema in BLE, sacral/rectal wound erythematous, ~ stage 3 with rectal excoriation with yellow/brown crusting & drainage,  deep fissure like open wound approx 3 inches in length in intergluteal cleft.  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/3rd toes with chronic dry gangrene (unchanged from prior admits)  LABS:  CBC  Recent Labs Lab 10/16/14 0756 10/17/14 0409 10/18/14 0620  WBC 8.9 8.4 7.7   HGB 15.5*  17.0* 11.6* 11.6*  HCT 50.6*  50.0* 37.5 38.1  PLT 137* 96* 83*   BMET  Recent Labs Lab 10/16/14 0756 10/17/14 0409 10/18/14 0620  NA 151*  154* 156* 148*  K 4.8  4.0 3.9 3.0*  CL 115*  116* 124* 119*  CO2 23 23 23   BUN 54*  49* 34* 28*  CREATININE 1.17*  0.90 0.62 0.62  GLUCOSE 735*  >700* 165* 140*   Electrolytes  Recent Labs Lab 10/16/14 0756 10/17/14 0409 10/18/14 0620  CALCIUM 8.9 7.7* 7.5*  MG  --  2.3  --   PHOS  --  3.0  --    Sepsis Markers  Recent Labs Lab 10/16/14 0748 10/16/14 1031 10/16/14 1045  LATICACIDVEN 6.69* 5.2* 5.35*   Liver Enzymes  Recent Labs Lab 10/16/14 0756  AST 36  ALT 22  ALKPHOS 127*  BILITOT 1.1  ALBUMIN 2.9*   Cardiac Enzymes  Recent Labs Lab 10/16/14 0756  TROPONINI 0.03   Glucose  Recent Labs Lab 10/17/14 0354 10/17/14 0850 10/17/14 1222 10/17/14 1615 10/17/14 1959 10/18/14 0002  GLUCAP 142* 201* 222* 99 221* 173*    Imaging No results found.   ASSESSMENT / PLAN:  PULMONARY OETT A: ILD - presumed IPF.  CXR clear on admission  Aspiration Risk  P:   Pulmonary hygiene Upright positioning, HOB @ 30 degrees  CARDIOVASCULAR CVL A:  Sepsis - concern for  urinary / wound source  Chronic dCHF Hx HTN Known Murmur - AS - moderate on TEE 07/20/14 & calcified MV P:  Resume baseline lasix 20 mg BID Lopressor 6.25 BID Tx to med floor See ID  Stress steroids until 1/20 pm, then resume prednisone at maintenence dosing am 1/21  RENAL A:   At Risk AKI - in setting of sepsis  Hypokalemia  Hypernatremia  Hyperchloremia  P:   Monitor BMP Replace electrolytes as indicated  Consider d/c foley in am 1/22.  Concern for wound contamination.  Free water 200 ml Q6  GASTROINTESTINAL A:   Severe Dysphagia - s/p PEG, NPO at baseline (since late fall 2015).  In setting of large hiatal hernia.  They have been doing evaluations with SLP for swallowing at home despite NPO status / rec's  with concern for aspiration.  Diarrhea - rule out infectious etiology  P:   NPO  Continue home PEG tube feeding Hold home imodium until stool studies returned  Continue florastor  See ID  HEMATOLOGIC A:   Thrombocytopenia - likely in setting of sepsis  P:  Trend CBC Heparin for DVT prophylaxis, monitor platelets closely (decreased from 137 to 83 1/21)  INFECTIOUS A:   Sacral Decubitus - wound has advanced since prior admissions ? UTI  P:   BCx2 1/19 >>  UC  1/19 >> neg C-Diff 1/19 >>  Norovirus 1/19 >>   Aztreonam, start date 1/19, day 2/x Flagyl, start date 1/19, day 2/x Vanco, start date 1/19, day 2/x  WOC consult Consider narrowing abx CCS consult appreciated, continue wound care per [facility] & follow up with outpatient wound clinic.  No need for I&D.  CT negative for abscess.  Consider hyperbaric Rx as an outpatient   ENDOCRINE A:   Diabetes Mellitus - new dx of DM this admit, Hgb A1c 8.2 P:   SSI  Lantus 5 units QD, monitor glucose closely  Transition to Q4 SSI, d/c ICU hyperglycemia protocol  Cautious administration of fluids with hx of dCHF  Given chronic diarrhea, not sure metformin would be a good choice here with SE of diarrhea.   Consider glyburide in am 1/22  NEUROLOGIC A:   Acute Encephalopathy - resolved.  Arthritis  Polymyalgia Rheumatica  Dementia  P:   Steroids as above Monitor mental status, supportive care    FAMILY  - Updates: Granddaughter updated at bedside 1/21.  Tx to floor & to [facility] as of 1/22 0700.     Noe Gens, NP-C [facility] Pgr: [phone] or [phone]   10/18/2014, 9:23 AM

## 2014-10-18 NOTE — Plan of Care (Signed)
Problem: Phase I Progression Outcomes Goal: Other Phase I Outcomes/Goals Outcome: Not Progressing Pt in contact precautions to r/o norovirus; grandaughter  Educated on but refuses to wear protective precautions. Lionel December

## 2014-10-19 DIAGNOSIS — E860 Dehydration: Secondary | ICD-10-CM

## 2014-10-19 DIAGNOSIS — R509 Fever, unspecified: Secondary | ICD-10-CM

## 2014-10-19 LAB — COMPREHENSIVE METABOLIC PANEL
ALBUMIN: 2.3 g/dL — AB (ref 3.5–5.2)
ALK PHOS: 117 U/L (ref 39–117)
ALT: 17 U/L (ref 0–35)
ANION GAP: 5 (ref 5–15)
AST: 22 U/L (ref 0–37)
BILIRUBIN TOTAL: 0.5 mg/dL (ref 0.3–1.2)
BUN: 18 mg/dL (ref 6–23)
CALCIUM: 7.2 mg/dL — AB (ref 8.4–10.5)
CO2: 23 mmol/L (ref 19–32)
Chloride: 115 mmol/L — ABNORMAL HIGH (ref 96–112)
Creatinine, Ser: 0.61 mg/dL (ref 0.50–1.10)
GFR calc non Af Amer: 79 mL/min — ABNORMAL LOW (ref >90)
GLUCOSE: 159 mg/dL — AB (ref 70–99)
POTASSIUM: 3.4 mmol/L — AB (ref 3.5–5.1)
Sodium: 143 mmol/L (ref 135–145)
TOTAL PROTEIN: 5.1 g/dL — AB (ref 6.0–8.3)

## 2014-10-19 LAB — GLUCOSE, CAPILLARY
GLUCOSE-CAPILLARY: 143 mg/dL — AB (ref 70–99)
GLUCOSE-CAPILLARY: 92 mg/dL (ref 70–99)
Glucose-Capillary: 134 mg/dL — ABNORMAL HIGH (ref 70–99)
Glucose-Capillary: 146 mg/dL — ABNORMAL HIGH (ref 70–99)
Glucose-Capillary: 171 mg/dL — ABNORMAL HIGH (ref 70–99)
Glucose-Capillary: 283 mg/dL — ABNORMAL HIGH (ref 70–99)
Glucose-Capillary: 338 mg/dL — ABNORMAL HIGH (ref 70–99)
Glucose-Capillary: 55 mg/dL — ABNORMAL LOW (ref 70–99)

## 2014-10-19 LAB — CBC
HEMATOCRIT: 38.7 % (ref 36.0–46.0)
Hemoglobin: 12 g/dL (ref 12.0–15.0)
MCH: 31.9 pg (ref 26.0–34.0)
MCHC: 31 g/dL (ref 30.0–36.0)
MCV: 102.9 fL — ABNORMAL HIGH (ref 78.0–100.0)
Platelets: 72 10*3/uL — ABNORMAL LOW (ref 150–400)
RBC: 3.76 MIL/uL — ABNORMAL LOW (ref 3.87–5.11)
RDW: 16.6 % — ABNORMAL HIGH (ref 11.5–15.5)
WBC: 7.2 10*3/uL (ref 4.0–10.5)

## 2014-10-19 LAB — MAGNESIUM: Magnesium: 2 mg/dL (ref 1.5–2.5)

## 2014-10-19 MED ORDER — CIPROFLOXACIN HCL 500 MG PO TABS
500.0000 mg | ORAL_TABLET | Freq: Two times a day (BID) | ORAL | Status: DC
  Administered 2014-10-19: 500 mg via ORAL
  Filled 2014-10-19 (×2): qty 1

## 2014-10-19 MED ORDER — CLINDAMYCIN PALMITATE HCL 75 MG/5ML PO SOLR
300.0000 mg | Freq: Three times a day (TID) | ORAL | Status: DC
  Administered 2014-10-19 – 2014-10-23 (×11): 300 mg
  Filled 2014-10-19 (×16): qty 20

## 2014-10-19 MED ORDER — KCL IN DEXTROSE-NACL 40-5-0.9 MEQ/L-%-% IV SOLN
INTRAVENOUS | Status: DC
  Administered 2014-10-19 – 2014-10-20 (×2): via INTRAVENOUS
  Filled 2014-10-19 (×3): qty 1000

## 2014-10-19 MED ORDER — INSULIN ASPART 100 UNIT/ML ~~LOC~~ SOLN
0.0000 [IU] | Freq: Every day | SUBCUTANEOUS | Status: DC
  Administered 2014-10-19: 15 [IU] via SUBCUTANEOUS
  Administered 2014-10-19: 11 [IU] via SUBCUTANEOUS
  Administered 2014-10-20: 7 [IU] via SUBCUTANEOUS
  Administered 2014-10-20: 4 [IU] via SUBCUTANEOUS
  Administered 2014-10-20: 3 [IU] via SUBCUTANEOUS
  Administered 2014-10-20: 11 [IU] via SUBCUTANEOUS
  Administered 2014-10-21: 4 [IU] via SUBCUTANEOUS
  Administered 2014-10-21: 7 [IU] via SUBCUTANEOUS
  Administered 2014-10-21: 4 [IU] via SUBCUTANEOUS
  Administered 2014-10-21: 11 [IU] via SUBCUTANEOUS
  Administered 2014-10-22: 4 [IU] via SUBCUTANEOUS
  Administered 2014-10-22: 7 [IU] via SUBCUTANEOUS
  Administered 2014-10-22: 15 [IU] via SUBCUTANEOUS

## 2014-10-19 MED ORDER — AMOXICILLIN-POT CLAVULANATE 875-125 MG PO TABS: 1.0000 | ORAL_TABLET | Freq: Two times a day (BID) | ORAL | Status: DC

## 2014-10-19 MED ORDER — CIPROFLOXACIN HCL 500 MG PO TABS
500.0000 mg | ORAL_TABLET | Freq: Two times a day (BID) | ORAL | Status: DC
  Administered 2014-10-20 – 2014-10-22 (×6): 500 mg
  Filled 2014-10-19 (×9): qty 1

## 2014-10-19 MED ORDER — CLINDAMYCIN HCL 300 MG PO CAPS
300.0000 mg | ORAL_CAPSULE | Freq: Three times a day (TID) | ORAL | Status: DC
  Administered 2014-10-19: 300 mg via ORAL
  Filled 2014-10-19 (×3): qty 1

## 2014-10-19 NOTE — Progress Notes (Signed)
ANTIBIOTIC CONSULT NOTE - INITIAL  Pharmacy Consult for ciprofloxacin Indication: UTI   Allergies  Allergen Reactions  . Doxycycline Nausea And Vomiting  . Other Nausea And Vomiting and Other (See Comments)    Anesthesia makes her very sleepy and makes it hard for her to come out of it.   Marland Kitchen Penicillins Hives and Nausea And Vomiting  . Sulfa Antibiotics Nausea And Vomiting    Patient Measurements: Height: 5\' 2"  (157.5 cm) Weight: 116 lb 3.2 oz (52.708 kg) IBW/kg (Calculated) : 50.1  Assessment: 81 y/o F brought to ED with fever, fatigue, AMS. UA consistent with infection, multiple deep decubitus ulcers present on buttocks, and lactate 6.69 - code sepsis called.  Aztreonam, vanco, metronidazole, levofloxacin ordered with pharmacy dosing assistance requested.  Patient had started on Cipro PTA as outpatient for UTI.  Pharmacy now consulted to to change antibiotics to ciprofloxacin for UTI. Clindamycin added for cellulitis coverage  Antiinfectives  1/19 >>aztreonam >> 1/22 1/19 >>vancomycin >> 1/22 1/19 >>metronidazole >> 1/22 1/19 >>levofloxacin >> 1/19 1/22 >> ciprofloxacin >> 1/22 > clindamycin >>  Labs / vitals Tmax: 101.2 > now afebrile WBCs: WNL Renal: SCr now stable at 0.62 (baseline appears ~0.5), CrCl 39 CG Lactic acid: 6.69 > 5.2 > 5.35  Microbiology 1/19 blood x2: ngtd 1/19 urine: NGF 1/19 wound (vagina): few candida sp- final 1/19 MRSA PCR: negative 1/19 Norovirus: IP 1/21 Cdiff: negative  Goal of Therapy:  Appropriate antibiotic dosing for indication and renal function; eradication of infection.   Plan:  - ciprofloxacin 500mg  PO BID to start at 1600 - clindamycin 300mg  PO TID to start at 1600 - follow-up clinical course, culture results, renal function - follow-up antibiotic de-escalation and length of therapy  Thank you for the consult.  Currie Paris, PharmD, BCPS Pager: [phone] Pharmacy: [phone] 10/19/2014 1:42 PM

## 2014-10-19 NOTE — Progress Notes (Addendum)
[facility] PROGRESS NOTE  Martha Mcbride ZOX:[id number] DOB: 1925-01-26 DOA: 10/16/2014 PCP: Kandice Hams, MD  Assessment/Plan: Active Problems:   Sepsis   Severe sepsis   New onset type 2 diabetes mellitus   UTI (lower urinary tract infection)   Sacral wound   Hyperglycemia   Protein-calorie malnutrition, severe   Diabetic hyperosmolar non-ketotic state   Decubitus skin ulcer    Sacral decubitus ulcers/UTI BCx2 1/19 >> no growth so far UC 1/19 >> neg C-Diff 1/19 >>  Norovirus 1/19 >>  possible hyperbaric therapy at the wound care center. change antibiotics to ciprofloxacin for UTI. Clindamycin added for cellulitis coverage and discontinue vancomycin aztreonam CT negative for abscess.  Consider hyperbaric Rx as an outpatient   Left leg swelling Geoffry Paradise R/o DVT, venous doppler   IVF/interstitial lung disease/aspiration risk Chest x-ray negative upon admission Continue Aspiration precautions, pulmonary hygiene  Sepsis, secondary to sacral decubitus ulcer/UTI Now improved, discontinued broad-spectrum antibiotics and switched to ciprofloxacin Stress dose steroids until 1/20 Continue maintenance dose steroids  Hypertension,AS - moderate on TEE 07/20/14 & calcified MV Continue Lopressor, low-dose Lasix  Hypernatremia, hypokalemia, acute kidney injury Secondary to dehydration Continue free water through feeding tube Replete potassium, check magnesium level DC Foley   Hyperglycemia,DM 2  Continue D5 normal saline Continue Lantus and SSI with tube feeding boluses, timing adjusted   Severe Dysphagia - s/p PEG, NPO at baseline (since late fall 2015). In setting of large hiatal hernia. They have been doing evaluations with SLP for swallowing at home despite NPO status / rec's with concern for aspiration.  Continue home PEG tube feeding Hold Imodium, C. difficile negative Continue florastor   Thrombocytopenia likely secondary to sepsis Follow  CBC Heparin for DVT prophylaxis    Code Status: full Family Communication: family updated about patient's clinical progress Disposition Plan:  As above    Brief narrative: 81 y/o F with a past medical history of breast cancer (remote), dementia, chronic diastolic CHF (baseline Lasix 20 mg BID), aortic stenosis and severely calcified mitral valve with moderate stenosis, gangrenous toes on the right foot (followed by Dr. Oneida Alar), polymyalgia rheumatica on prednisone (20 mg QD), interstitial lung disease / presumed IPF, large hiatal hernia, severe dysphasia status post PEG tube placement, multiple admissions for aspiration pneumonia, chronic diarrhea, who is chair/bed bound at baseline (followed at the wound clinic at [facility]). She is cared for at home by her daughters [name] and [name].   Chart review notes that they call the cardiology office on 1/17 with concerns for tachycardia and lethargy. The daughters medicated the patient with an additional dose of Lopressor. They returned to the cardiology office for an acute visit on 1/18 with a three-day history of progressive weakness and fatigue. Notes reflect that a urinalysis was obtained and the family was encouraged to send the patient to the hospital for evaluation. She was treated with Cipro for UTI. However, they obtained a urinalysis and then proceeded to leave the office. They called the office to let them know that they were taking her home. Attempts were made to contact her via phone & messages were left at that time.   On 1/19 the patient presented to the [facility] emergency room via EMS with progressive confusion, lethargy and tachypnea. Initial BP 118/65, rate 133, rectal temperature of 101.2, RR 31 and SPO2 94%. Initial lab work notable for hgb 15.5, Na 151, CL 1:15, BUN 54, sr cr on 0.17, albumin 2.9, glucose 735 and lactic acid of 6.69.  Repeat urinalysis on 1/19 with glucose greater than 1000, negative nitrite, negative  leukocytes, 3-6 WBC, and few bacteria. CXR was negative for acute infiltrate. She was started on an insulin drip in the emergency room and treated with IV abx for potential wound infection/UTI. Of note, her caretakers have apparently had something similar to a viral gastroenteritis. PCCM consulted for ICU admission.  Consultants:  Critical care  Procedures:  None   Antiinfectives  1/19 >>aztreonam >> 1/22 1/19 >>vancomycin >> 1/22 1/19 >>metronidazole >> 1/22 1/19 >>levofloxacin >> 1/19 1/22 >> ciprofloxacin >> 1/22 > clindamycin >>  HPI/Subjective: One episode of hypoglycemia at 4 AM this morning. Otherwise no complaints denies any nausea vomiting abdominal pain, low-grade diarrhea   Objective: Filed Vitals:   10/18/14 1200 10/18/14 1600 10/18/14 2113 10/19/14 0510  BP: 110/53 124/64 116/66 134/75  Pulse: 93 76 85 92  Temp: 98.1 F (36.7 C)  98.4 F (36.9 C) 98 F (36.7 C)  TempSrc: Oral  Oral Oral  Resp: 22  20 20   Height:      Weight:    52.708 kg (116 lb 3.2 oz)  SpO2: 100% 98% 100% 95%    Intake/Output Summary (Last 24 hours) at 10/19/14 1130 Last data filed at 10/19/14 0630  Gross per 24 hour  Intake   1015 ml  Output   2020 ml  Net  -1005 ml    Exam: General: Frail elderly female, awake/pleasant  Neuro: Awake/alert, oriented to self, place & events, MAE, generalized weakness  HEENT: Mm pink/dry, no jvd, dentures in place  Cardiovascular: s1s2 rrr, 3/6 SEM Lungs: Even/non-labored, lungs bilaterally with fine basilar crackles Abdomen: PEG c/d/i, NTND Musculoskeletal: No acute deformities  Skin: Warm/dry, good cap refills, 1+ pitting edema in BLE, sacral/rectal wound erythematous, ~ stage 3 with rectal excoriation with yellow/brown crusting & drainage, deep fissure like open wound approx 3 inches in length in intergluteal cleft. R foot 2nd/3rd toes with chronic dry gangrene (unchanged from prior admits)       Data Reviewed: Basic  Metabolic Panel:  Recent Labs Lab 10/16/14 0756 10/17/14 0409 10/18/14 0620  NA 151*  154* 156* 148*  K 4.8  4.0 3.9 3.0*  CL 115*  116* 124* 119*  CO2 23 23 23   GLUCOSE 735*  >700* 165* 140*  BUN 54*  49* 34* 28*  CREATININE 1.17*  0.90 0.62 0.62  CALCIUM 8.9 7.7* 7.5*  MG  --  2.3  --   PHOS  --  3.0  --     Liver Function Tests:  Recent Labs Lab 10/16/14 0756  AST 36  ALT 22  ALKPHOS 127*  BILITOT 1.1  PROT 6.1  ALBUMIN 2.9*   No results for input(s): LIPASE, AMYLASE in the last 168 hours. No results for input(s): AMMONIA in the last 168 hours.  CBC:  Recent Labs Lab 10/16/14 0756 10/17/14 0409 10/18/14 0620 10/19/14 1105  WBC 8.9 8.4 7.7 7.2  NEUTROABS 5.4  --   --   --   HGB 15.5*  17.0* 11.6* 11.6* 12.0  HCT 50.6*  50.0* 37.5 38.1 38.7  MCV 105.4* 101.6* 103.0* 102.9*  PLT 137* 96* 83* 72*    Cardiac Enzymes:  Recent Labs Lab 10/16/14 0756  TROPONINI 0.03   BNP (last 3 results)  Recent Labs  07/19/14 2039 07/28/14 1225 07/29/14 0330  PROBNP 1467.0* 576.5* 753.4*     CBG:  Recent Labs Lab 10/18/14 1957 10/19/14 0005 10/19/14 0454 10/19/14 0524 10/19/14 0750  GLUCAP 287* 171* 55* 92 146*    Recent Results (from the past 240 hour(s))  Blood Culture (routine x 2)     Status: None (Preliminary result)   Collection Time: 10/16/14  7:39 AM  Result Value Ref Range Status   Specimen Description BLOOD RIGHT HAND  Final   Special Requests BOTTLES DRAWN AEROBIC AND ANAEROBIC 3ML  Final   Culture   Final           BLOOD CULTURE RECEIVED NO GROWTH TO DATE CULTURE WILL BE HELD FOR 5 DAYS BEFORE ISSUING A FINAL NEGATIVE REPORT Performed at [facility]    Report Status PENDING  Incomplete  Blood Culture (routine x 2)     Status: None (Preliminary result)   Collection Time: 10/16/14  7:55 AM  Result Value Ref Range Status   Specimen Description BLOOD RIGHT ANTECUBITAL  Final   Special Requests BOTTLES DRAWN AEROBIC  AND ANAEROBIC 5ML  Final   Culture   Final           BLOOD CULTURE RECEIVED NO GROWTH TO DATE CULTURE WILL BE HELD FOR 5 DAYS BEFORE ISSUING A FINAL NEGATIVE REPORT Performed at [facility]    Report Status PENDING  Incomplete  Wound culture     Status: None   Collection Time: 10/16/14  8:25 AM  Result Value Ref Range Status   Specimen Description WOUND VAGINA  Final   Special Requests NONE  Final   Gram Stain   Final    FEW WBC PRESENT,BOTH PMN AND MONONUCLEAR NO SQUAMOUS EPITHELIAL CELLS SEEN RARE GRAM POSITIVE COCCI IN PAIRS IN CLUSTERS RARE GRAM NEGATIVE RODS Performed at [facility]    Culture   Final    FEW YEAST CONSISTENT WITH CANDIDA SPECIES Performed at [facility]    Report Status 10/18/2014 FINAL  Final  Urine culture     Status: None   Collection Time: 10/16/14  8:27 AM  Result Value Ref Range Status   Specimen Description URINE, CATHETERIZED  Final   Special Requests NONE  Final   Colony Count NO GROWTH Performed at [facility]   Final   Culture NO GROWTH Performed at [facility]   Final   Report Status 10/17/2014 FINAL  Final  MRSA PCR Screening     Status: None   Collection Time: 10/16/14  1:30 PM  Result Value Ref Range Status   MRSA by PCR NEGATIVE NEGATIVE Final    Comment:        The GeneXpert MRSA Assay (FDA approved for NASAL specimens only), is one component of a comprehensive MRSA colonization surveillance program. It is not intended to diagnose MRSA infection nor to guide or monitor treatment for MRSA infections.   Clostridium Difficile by PCR     Status: None   Collection Time: 10/18/14  9:50 AM  Result Value Ref Range Status   C difficile by pcr NEGATIVE NEGATIVE Final    Comment: Performed at [facility]     Studies: Ct Abdomen Pelvis Wo Contrast  10/16/2014   CLINICAL DATA:  Decubitus ulcer  EXAM: CT ABDOMEN AND PELVIS WITHOUT CONTRAST  TECHNIQUE: Multidetector CT imaging of  the abdomen and pelvis was performed following the standard protocol without IV contrast.  COMPARISON:  05/21/2012  FINDINGS: BODY WALL:  There is ulceration of the mid gluteal cleft with granulation tissue or other soft tissue extends to contact the sacrum, appearance similar to previous. There are no sacral  changes of osteomyelitis. Fissured appearance of the lower gluteal cleft, extending towards the anus. There is skin thickening and subcutaneous reticulation in the lower left gluteal region which likely also represents pressure changes. No evidence of tract.  Lumpectomy in the lower right breast. Surgical size stable from prior.  LOWER CHEST: There is a moderate sliding-type hiatal hernia. Detection of esophageal or gastric thickening not possible due to under distention and lack of IV contrast.  Bulky mitral annular calcification noted. Chronic subpleural reticulation in the lower lungs.  ABDOMEN/PELVIS:  Liver: Small cysts again noted in the liver.  Biliary: Cholelithiasis. Gallbladder is full but there is no inflammatory change.  Pancreas: Unremarkable.  Spleen: Unremarkable.  Adrenals: Unremarkable.  Kidneys and ureters: 2 small high-density areas within the lower right kidney likely reflects small hemorrhagic cyst. Small low-density foci in the more cranial right renal cortex is likely also cysts.  Bladder: Decompressed by Foley catheter.  Reproductive: Hysterectomy. Decreased size of a cyst in the right hemipelvis, currently 4.8 cm previously 6.4 cm. As noted previously this could be an ovarian or peritoneal cyst.  Bowel: Sliding hiatal hernia. There is percutaneous gastrostomy tube which is in good position. No pericecal inflammation.  Retroperitoneum: No mass or adenopathy.  Peritoneum: No ascites or pneumoperitoneum.  Vascular: No acute abnormality.  OSSEOUS: Healed insufficiency fractures of the right obturator ring. Diffuse and advanced lumbar degenerative disc disease with levoscoliosis. No acute  fracture.  IMPRESSION: 1. Decubitus ulcers, the deepest in the gluteal cleft. No abscess, undermining, or osteomyelitis identified. 2. Chronic findings are stable from 2013 and noted above.   Electronically Signed   By: Jorje Guild M.D.   On: 10/16/2014 22:03   Dg Chest 1 View  09/26/2014   CLINICAL DATA:  Pneumonia.  EXAM: CHEST - 1 VIEW  COMPARISON:  09/14/2014.  02/10/2014.  FINDINGS: Mediastinum and hilar structures normal. Cardiomegaly with normal pulmonary vascularity. No focal pulmonary infiltrate. Chronic interstitial prominence. No pleural effusion or pneumothorax. Degenerative changes thoracic spine. Sliding hiatal hernia.  IMPRESSION: 1. Cardiomegaly.  No overt pulmonary edema. 2. Chronic interstitial lung disease. No focal pulmonary infiltrate.   Electronically Signed   By: Marcello Moores  Register   On: 09/26/2014 11:36   Dg Chest Port 1 View  10/16/2014   CLINICAL DATA:  Shortness of breath and lethargy  EXAM: PORTABLE CHEST - 1 VIEW  COMPARISON:  September 26, 2014  FINDINGS: There is no edema or consolidation. Heart size and pulmonary vascularity are normal. No adenopathy. There is calcification in the mitral annulus. There is degenerative change in the lower thoracic and upper lumbar region with upper lumbar levoscoliosis.  IMPRESSION: No edema or consolidation.   Electronically Signed   By: Lowella Grip M.D.   On: 10/16/2014 08:16    Scheduled Meds: . aspirin  81 mg Per Tube Daily  . aztreonam  1 g Intravenous Q8H  . collagenase   Topical Daily  . enoxaparin (LOVENOX) injection  30 mg Subcutaneous Q24H  . folic acid  1 mg Per Tube Daily  . free water  200 mL Per Tube Q6H  . furosemide  20 mg Per Tube BID  . glyBURIDE  2.5 mg Oral Q breakfast  . insulin aspart  0-20 Units Subcutaneous 5 X Daily  . insulin glargine  5 Units Subcutaneous QHS  . lidocaine  1 application Topical Daily  . metoprolol tartrate  6.25 mg Per Tube BID  . metronidazole  500 mg Intravenous Q8H  .  PERATIVE  120 mL Oral 5 X Daily  . potassium chloride  20 mEq Per Tube Daily  . predniSONE  20 mg Per Tube Daily  . ranitidine  150 mg Per Tube QHS  . saccharomyces boulardii  250 mg Oral BID  . vancomycin  750 mg Intravenous Q24H   Continuous Infusions: . dextrose 5 % and 0.45% NaCl 1,000 mL (10/18/14 1531)    Active Problems:   Sepsis   Severe sepsis   New onset type 2 diabetes mellitus   UTI (lower urinary tract infection)   Sacral wound   Hyperglycemia   Protein-calorie malnutrition, severe   Diabetic hyperosmolar non-ketotic state   Decubitus skin ulcer    Time spent: 40 minutes   [facility] Pager [phone]. If 7PM-7AM, please contact night-coverage at [email or web address], password [facility] 10/19/2014, 11:30 AM  LOS: 3 days

## 2014-10-19 NOTE — Progress Notes (Signed)
NUTRITION FOLLOW-UP  INTERVENTION: -Increase Perative via PEG, 120 ml (1/2 can) five times daily (6 am, 10 am, 2 pm, 6 pm, 10 pm) to home regimen of  Perative formula, bolus feeds of 8 oz at 6am, then 7 oz at 10am, 2 pm, 6 pm, and 10 pm. This provides. 1388 kcal, 72g protein and 843 ml of H2O.  -Monitor Blood Glucose -RD to follow-up daily   NUTRITION DIAGNOSIS: Inadequate oral intake related to inability to eat as evidenced by NPO status, ongoing.  Goal: Pt to meet >/= 90% of their estimated nutrition needs, unmet   Monitor:  TF regimen & tolerance, weight, labs, I/O's  ASSESSMENT: 81 y.o female who on 1/19  presented to the [facility] emergency room via EMS with progressive confusion, lethargy and tachypnea.  1/20: -Pt familiar to this RD from previous admissions (07/30/14-08/10/14, 08/25/14-09/05/14). -Pt was admitted with CBGs in the 700s, HgbA1c of 8.2 and pt has been experiencing some diarrhea which stopped when TF was held. -Per granddaughter, pt on home TF regimen, Perative formula, bolus feeds of 8 oz at 6am, then 7 oz at 10am, 2 pm, 6 pm, and 10 pm. This provides. 1388 kcal, 72g protein and 843 ml of H2O. Family would prefer to continue with the same formula as they saw less diarrhea and side effects with this formula vs. Jevity 1.2. -Spoke with pharmacy about ordering this product, per policy it should be provided within 72 hours. Family has opted to provide supply of Perative formula from home. RD to order TF initiation and TF to start when family supplies formula.  1/21 -Pt is tolerating 1/2 doses of Perative TF 5 times daily. -Will monitor tolerance a little longer and advance to home regimen. -Pt with some diarrhea since TF started, c diff pending. -Spoke with granddaughter at bedside, reports no issues currently. Has no questions at this time.  1/22: - Pt tolerating 1/2 doses of TF at this time. Per RN, pt without diarrhea, but stools are soft. Pt's family member had  questions about pt's blood sugar being high. Concerned whether TF formula should be changed. RD recommended to stay on the current formula through the weekend and monitor blood glucose. Pt with history of severe diarrhea with formula change.   Labs: CBGs 92-146 Na WNL K low Mg and Phos WNL  Height: Ht Readings from Last 1 Encounters:  10/16/14 5\' 2"  (1.575 m)    Weight: Wt Readings from Last 1 Encounters:  10/19/14 116 lb 3.2 oz (52.708 kg)    BMI:  Body mass index is 21.25 kg/(m^2).  Estimated Nutritional Needs: Kcal: 1600-1800 Protein: 85-95g Fluid: 1.6L/day  Skin: gangrenous toes, stage 3 sacral pressure ulcer, +2 RLE, +2 LLE    Diet Order: Diet NPO time specified  EDUCATION NEEDS: -Education needs addressed   Intake/Output Summary (Last 24 hours) at 10/19/14 1226 Last data filed at 10/19/14 0630  Gross per 24 hour  Intake    930 ml  Output   2020 ml  Net  -1090 ml    Last BM: 1/22  Labs:   Recent Labs Lab 10/17/14 0409 10/18/14 0620 10/19/14 1100 10/19/14 1105  NA 156* 148*  --  143  K 3.9 3.0*  --  3.4*  CL 124* 119*  --  115*  CO2 23 23  --  23  BUN 34* 28*  --  18  CREATININE 0.62 0.62  --  0.61  CALCIUM 7.7* 7.5*  --  7.2*  MG 2.3  --  2.0  --   PHOS 3.0  --   --   --   GLUCOSE 165* 140*  --  159*    CBG (last 3)   Recent Labs  10/19/14 0524 10/19/14 0750 10/19/14 1205  GLUCAP 92 146* 134*    Scheduled Meds: . amoxicillin-clavulanate  1 tablet Oral Q12H  . aspirin  81 mg Per Tube Daily  . collagenase   Topical Daily  . enoxaparin (LOVENOX) injection  30 mg Subcutaneous Q24H  . folic acid  1 mg Per Tube Daily  . free water  200 mL Per Tube Q6H  . furosemide  20 mg Per Tube BID  . glyBURIDE  2.5 mg Oral Q breakfast  . insulin aspart  0-20 Units Subcutaneous 5 X Daily  . insulin glargine  5 Units Subcutaneous QHS  . lidocaine  1 application Topical Daily  . metoprolol tartrate  6.25 mg Per Tube BID  . PERATIVE  120 mL Oral 5  X Daily  . potassium chloride  20 mEq Per Tube Daily  . predniSONE  20 mg Per Tube Daily  . ranitidine  150 mg Per Tube QHS  . saccharomyces boulardii  250 mg Oral BID    Continuous Infusions: . dextrose 5 % and 0.9 % NaCl with KCl 40 mEq/L      Laurette Schimke [place], [place], [place]

## 2014-10-19 NOTE — Progress Notes (Signed)
Pt alert and oriented at 4:50 am no complains of feeling bad. Cbg checked was 55. Gave 120 cc of her scheduled morning feed and cbg rechecked per protocol in 20 mins was 92. Will continue to monitor closely. Pt resting well at this time

## 2014-10-19 NOTE — Progress Notes (Signed)
Inpatient Diabetes Program Recommendations  AACE/ADA: New Consensus Statement on Inpatient Glycemic Control (2013)  Target Ranges:  Prepandial:   less than 140 mg/dL      Peak postprandial:   less than 180 mg/dL (1-2 hours)      Critically ill patients:  140 - 180 mg/dL     Results for Martha Mcbride, Martha Mcbride (MRN [id number]) as of 10/19/2014 10:20  Ref. Range 10/19/2014 00:05 10/19/2014 04:54 10/19/2014 05:24 10/19/2014 07:50  Glucose-Capillary Latest Range: 70-99 mg/dL 171 (H) 55 (L) 92 146 (H)      81 y/o F admitted with sepsis (urinary + wounds) & hyperglycemia. New Dx DM.   Current Orders: Lantus 5 units Q24 hours  Novolog Resistant SSI Q4 hours  Glyburide 2.5 mg daily ( to started yesterday)   **Patient transfered to med-surg bed yesterday  **DM Coordinator spoke with patient's granddaughter yesterday about new diagnosis of DM (see note from 01/21)  **Not sure if patient will need insulin at home. Patient does get tube feed boluses at home which could lead to hyperglycemia. Let's see how patient's glucose levels respond to tube feed boluses here in hospital to help better determine if she will need insulin at home.   MD- May want to consider changing Novolog SSI coverage to coincide with patient's Perative Tube Feed boluses (6am, 10am, 2pm, 6pm, 10pm)  Please also consider decreasing Novolog SSI to Moderate scale (Hypoglycemia at 4am today)   Will follow Wyn Quaker RN, MSN, CDE Diabetes Coordinator Inpatient Diabetes Program Team Pager: [phone] (8a-10p)

## 2014-10-20 LAB — COMPREHENSIVE METABOLIC PANEL
ALBUMIN: 2.2 g/dL — AB (ref 3.5–5.2)
ALK PHOS: 101 U/L (ref 39–117)
ALK PHOS: 110 U/L (ref 39–117)
ALT: 10 U/L (ref 0–35)
ALT: 18 U/L (ref 0–35)
AST: 60 U/L — ABNORMAL HIGH (ref 0–37)
AST: 24 U/L (ref 0–37)
Albumin: 2 g/dL — ABNORMAL LOW (ref 3.5–5.2)
Anion gap: 5 (ref 5–15)
Anion gap: 6 (ref 5–15)
BILIRUBIN TOTAL: 0.4 mg/dL (ref 0.3–1.2)
BUN: 14 mg/dL (ref 6–23)
BUN: 15 mg/dL (ref 6–23)
CHLORIDE: 117 mmol/L — AB (ref 96–112)
CO2: 20 mmol/L (ref 19–32)
CO2: 21 mmol/L (ref 19–32)
CREATININE: 0.52 mg/dL (ref 0.50–1.10)
CREATININE: 0.54 mg/dL (ref 0.50–1.10)
Calcium: 7 mg/dL — ABNORMAL LOW (ref 8.4–10.5)
Calcium: 7.1 mg/dL — ABNORMAL LOW (ref 8.4–10.5)
Chloride: 115 mmol/L — ABNORMAL HIGH (ref 96–112)
GFR calc Af Amer: >90 mL/min (ref >90)
GFR calc Af Amer: >90 mL/min (ref >90)
GFR, EST NON AFRICAN AMERICAN: 83 mL/min — AB (ref >90)
GFR, EST NON AFRICAN AMERICAN: 82 mL/min — AB (ref >90)
GLUCOSE: 179 mg/dL — AB (ref 70–99)
Glucose, Bld: 76 mg/dL (ref 70–99)
Potassium: 5.8 mmol/L — ABNORMAL HIGH (ref 3.5–5.1)
Potassium: 3.8 mmol/L (ref 3.5–5.1)
Sodium: 142 mmol/L (ref 135–145)
Sodium: 142 mmol/L (ref 135–145)
TOTAL PROTEIN: 4.1 g/dL — AB (ref 6.0–8.3)
Total Bilirubin: 2.4 mg/dL — ABNORMAL HIGH (ref 0.3–1.2)
Total Protein: 4.7 g/dL — ABNORMAL LOW (ref 6.0–8.3)

## 2014-10-20 LAB — GLUCOSE, CAPILLARY
GLUCOSE-CAPILLARY: 129 mg/dL — AB (ref 70–99)
GLUCOSE-CAPILLARY: 135 mg/dL — AB (ref 70–99)
GLUCOSE-CAPILLARY: 214 mg/dL — AB (ref 70–99)
GLUCOSE-CAPILLARY: 193 mg/dL — AB (ref 70–99)
Glucose-Capillary: 63 mg/dL — ABNORMAL LOW (ref 70–99)
Glucose-Capillary: 283 mg/dL — ABNORMAL HIGH (ref 70–99)

## 2014-10-20 MED ORDER — ENOXAPARIN SODIUM 30 MG/0.3ML ~~LOC~~ SOLN
30.0000 mg | SUBCUTANEOUS | Status: AC
  Administered 2014-10-20: 30 mg via SUBCUTANEOUS
  Filled 2014-10-20: qty 0.3

## 2014-10-20 MED ORDER — ENOXAPARIN SODIUM 60 MG/0.6ML ~~LOC~~ SOLN
50.0000 mg | Freq: Two times a day (BID) | SUBCUTANEOUS | Status: DC
  Administered 2014-10-21 – 2014-10-25 (×8): 50 mg via SUBCUTANEOUS
  Filled 2014-10-20 (×12): qty 0.6

## 2014-10-20 MED ORDER — HYDROMORPHONE HCL 1 MG/ML IJ SOLN
0.5000 mg | Freq: Four times a day (QID) | INTRAMUSCULAR | Status: DC | PRN
  Administered 2014-10-20: 0.5 mg via INTRAVENOUS
  Filled 2014-10-20 (×2): qty 1

## 2014-10-20 MED ORDER — POTASSIUM CHLORIDE IN NACL 20-0.9 MEQ/L-% IV SOLN
INTRAVENOUS | Status: DC
  Administered 2014-10-20: 1000 mL via INTRAVENOUS
  Administered 2014-10-21: 07:00:00 via INTRAVENOUS
  Filled 2014-10-20 (×3): qty 1000

## 2014-10-20 MED ORDER — NYSTATIN 100000 UNIT/ML MT SUSP
5.0000 mL | Freq: Four times a day (QID) | OROMUCOSAL | Status: DC
  Administered 2014-10-20 – 2014-10-26 (×24): 500000 [IU] via ORAL
  Filled 2014-10-20 (×27): qty 5

## 2014-10-20 NOTE — Progress Notes (Signed)
Repeat CMET consistent with previous days.

## 2014-10-20 NOTE — Progress Notes (Signed)
Reordered Cmet due to significant change in lab values. Phlebotomy did have difficult time drawing lab this am.

## 2014-10-20 NOTE — Progress Notes (Signed)
Spoke to Vascular Tech about Dopplers. Tech at [facility] and "hopeful" to get over here to do doppler today.

## 2014-10-20 NOTE — Progress Notes (Signed)
CBG 129 now.

## 2014-10-20 NOTE — Progress Notes (Signed)
Patient c/o severe pain to her buttocks,crying,can't get comfortable despite of turning and repositioning her every 2 hours. Dr. Allyson Sabal notified and ordered Dilaudid 0.5 mg every 6 hours PRN for severe pain. Will administer meds and will continue to monitor the patient.- Sandie Ano RN

## 2014-10-20 NOTE — Progress Notes (Signed)
Dr. Allyson Sabal notified about results of doppler performed by vascular Lab, new order received for pharmacy consult for lovenox. Sandie Ano RN

## 2014-10-20 NOTE — Plan of Care (Signed)
Problem: Phase III Progression Outcomes Goal: Voiding independently Outcome: Not Applicable Date Met:  74/09/92 Patient has a foley catheter.

## 2014-10-20 NOTE — Progress Notes (Signed)
Patient's daughter [name] asked me to her her  Again,attemted to call herx2,but her mail box is full,can't leave a message. I will endorsed this to night nurse Anderson Malta.Sandie Ano RN

## 2014-10-20 NOTE — Progress Notes (Addendum)
ANTICOAGULATION CONSULT NOTE - Initial Consult  Pharmacy Consult for Lovenox Indication: DVT  Allergies  Allergen Reactions  . Doxycycline Nausea And Vomiting  . Other Nausea And Vomiting and Other (See Comments)    Anesthesia makes her very sleepy and makes it hard for her to come out of it.   Marland Kitchen Penicillins Hives and Nausea And Vomiting  . Sulfa Antibiotics Nausea And Vomiting    Patient Measurements: Height: 5\' 2"  (157.5 cm) Weight: 116 lb 1.6 oz (52.663 kg) IBW/kg (Calculated) : 50.1 Heparin Dosing Weight: n/a  Vital Signs: Temp: 98.1 F (36.7 C) (01/23 1335) Temp Source: Oral (01/23 1335) BP: 108/51 mmHg (01/23 1705) Pulse Rate: 88 (01/23 1705)  Labs:  Recent Labs  10/18/14 0620 10/19/14 1105 10/20/14 0425 10/20/14 0653  HGB 11.6* 12.0  --   --   HCT 38.1 38.7  --   --   PLT 83* 72*  --   --   CREATININE 0.62 0.61 0.52 0.54    Estimated Creatinine Clearance: 39.2 mL/min (by C-G formula based on Cr of 0.54).   Medical History: Past Medical History  Diagnosis Date  . Arthritis   . Polymyalgia   . Acid reflux   . Immune deficiency disorder     autoimmune disorder - polymyalgia - on prednisone  . Breast cancer     Treated with lumpectomy and radiation  . Giardia   . Pinworms   . History of hiatal hernia   . Pneumonia   . Dementia   . CHF (congestive heart failure)   . Dysrhythmia     Medications:  Scheduled:  . aspirin  81 mg Per Tube Daily  . ciprofloxacin  500 mg Per Tube BID  . clindamycin  300 mg Per Tube 3 times per day  . collagenase   Topical Daily  . enoxaparin (LOVENOX) injection  30 mg Subcutaneous STAT  . [START ON 10/21/2014] enoxaparin (LOVENOX) injection  50 mg Subcutaneous Q12H  . folic acid  1 mg Per Tube Daily  . free water  200 mL Per Tube Q6H  . furosemide  20 mg Per Tube BID  . insulin aspart  0-20 Units Subcutaneous 5 X Daily  . lidocaine  1 application Topical Daily  . metoprolol tartrate  6.25 mg Per Tube BID  .  nystatin  5 mL Oral QID  . PERATIVE  120 mL Oral 5 X Daily  . potassium chloride  20 mEq Per Tube Daily  . predniSONE  20 mg Per Tube Daily  . ranitidine  150 mg Per Tube QHS  . saccharomyces boulardii  250 mg Oral BID    Assessment: 89yoF admitted to 1/19 for sepsis from wound infection vs UTI.  Now found to  have new bilateral DVT.  Noted with thrombocytopenia, possibly from sepsis vs ranitidine.  Suspected HIT during last admission, but HIT panel returned negative.  PMH  CHF, ILD, hiatal hernia s/p PEG tube placement, dementia.  Received prophylactic dose Lovenox (30 mg) at 1400 today.  Baseline CBC wnl except for plt (see above); Plt 137 on admission No bleeding issues noted Moderate renal impairment; low SCr (chair/bed bound at baseline); CrCl 39 CG using SCr 0.8   Goal of Therapy:  Anti-Xa level 0.6-1 units/ml 4hrs after LMWH dose given Monitor platelets by anticoagulation protocol: Yes    Plan:   Stop prophylactic Lovenox  Give Lovenox 30 mg SQ x 1 now.  Patient received 30 mg at 1400 today, and based on ~7  hr half life with patient's current renal function, I expect this dose to provide anti-Xa activity equivalent to a therapeutic 50 mg Lovenox dose  Begin Lovenox 50 mg SQ q12 starting at 0600 tomorrow 1/24  CBC ordered for tomorrow.  HIT unlikely, but would consider switching to an alternate agent if platelets begin to decline more rapidly  CBC, SCr at least q72 hrs while on Lovenox  F/u renal function, resolution of DVT symptoms, and for signs of bleeding   Reuel Boom, PharmD Pager: [phone] 10/20/2014, 6:55 PM

## 2014-10-20 NOTE — Progress Notes (Signed)
CARE MANAGEMENT NOTE 10/20/2014  Patient:  Martha Mcbride, Martha Mcbride   Account Number:  [id number]  Date Initiated:  10/20/2014  Documentation initiated by:  Dessa Phi  Subjective/Objective Assessment:   81 y/o f admitted w/Sepsis.     Action/Plan:   Anticipated DC Date:  10/23/2014   Anticipated DC Plan:  [place]  CM consult      Choice offered to / List presented to:  C-1 Patient           Status of service:  In process, will continue to follow Medicare Important Message given?   (If response is "NO", the following Medicare IM given date fields will be blank) Date Medicare IM given:   Medicare IM given by:   Date Additional Medicare IM given:   Additional Medicare IM given by:    Discharge Disposition:    Per UR Regulation:    If discussed at Long Length of Stay Meetings, dates discussed:    Comments:  10/20/14 Dessa Phi RN BSN NCM [phone] [place] has referral, & following.

## 2014-10-20 NOTE — Progress Notes (Signed)
PT Cancellation Note  Patient Details Name: Martha Mcbride MRN: [id number] DOB: August 16, 1925   Cancelled Treatment:    Reason Eval/Treat Not Completed: Medical issues which prohibited therapy (await r/o DVT's)check on patient 10/21/14   Marcelino Freestone PT [phone]  10/20/2014, 1:48 PM

## 2014-10-20 NOTE — Progress Notes (Signed)
Daughter [name] updated with patient's condition, was appreciative of the information given to her.Sandie Ano RN

## 2014-10-20 NOTE — Progress Notes (Signed)
CBG 63 this am. Gave 6 am tube feeding early.

## 2014-10-20 NOTE — Progress Notes (Signed)
VASCULAR LAB PRELIMINARY  PRELIMINARY  PRELIMINARY  PRELIMINARY  Bilateral lower extremity venous duplex  completed.    Preliminary report:  Right:  DVT noted in the FV and popliteal vein.  No evidence of superficial thrombosis.  No Baker's cyst.  Left: DVT noted in the CFV, FV, popliteal vein.  No evidence of superficial thrombosis.  No Baker's cyst.    ABI deferred secondary to DVT and Leontine Locket, PA's office visit note from 09-11-14.   Amila Callies, RVT 10/20/2014, 5:58 PM

## 2014-10-20 NOTE — Progress Notes (Signed)
Patient denies pain to buttocks at this time,verbally responsive. Speech is clear. Daughter at bedside.- Sandie Ano RN

## 2014-10-20 NOTE — Progress Notes (Addendum)
[facility] PROGRESS NOTE  Martha Mcbride AST:[id number] DOB: March 21, 1925 DOA: 10/16/2014 PCP: Martha Hams, MD  Assessment/Plan: Active Problems:   Sepsis   Severe sepsis   New onset type 2 diabetes mellitus   UTI (lower urinary tract infection)   Sacral wound   Hyperglycemia   Protein-calorie malnutrition, severe   Diabetic hyperosmolar non-ketotic state   Decubitus skin ulcer    Sacral decubitus ulcers/UTI BCx2 1/19 >> no growth so far UC 1/19 >> neg C-Diff 1/19 >> negative Norovirus 1/19 >> pending possible hyperbaric therapy at the wound care center once the patient is discharged. change antibiotics to ciprofloxacin for UTI. Clindamycin added for cellulitis coverage and discontinue vancomycin aztreonam CT negative for abscess.  Consider hyperbaric Rx as an outpatient    Left leg swelling Martha Mcbride R/o DVT, venous doppler pending We will also check an ABI given history of gangrenous toes on the right foot  IVF/interstitial lung disease/aspiration risk Chest x-ray negative upon admission Continue Aspiration precautions, pulmonary hygiene Patient npo and receiving tube feedings   Sepsis, secondary to sacral decubitus ulcer/UTI Now improved, discontinued broad-spectrum antibiotics and switched to ciprofloxacin, clindamycin Stress dose steroids until 1/20 Continue maintenance dose steroids , this is a continuation of her home medication   Hypertension,AS - moderate on TEE 07/20/14 & calcified MV Continue Lopressor, continue low-dose Lasix   Hypernatremia, hyperkalemia , hypokalemia, acute kidney injury All of this is improved and within normal limits today Continue free water through feeding tube Continue Foley catheter given perineal ulcers   Hyperglycemia,DM 2  Discontinue D5 Discontinue Lantus as this is invariably caused 4 AM hypoglycemia Continue SSI with tube feeding boluses, timing adjusted   Severe Dysphagia - s/p PEG, NPO at baseline  (since late fall 2015). In setting of large hiatal hernia. They have been doing evaluations with SLP for swallowing at home despite NPO status / rec's with concern for aspiration.  Continue home PEG tube feeding Hold Imodium, C. difficile negative Continue florastor    Thrombocytopenia likely secondary to sepsis Follow CBC in a.m. Heparin for DVT prophylaxis    Code Status: full Family Communication: family updated about patient's clinical progress Disposition Plan: up With therapy once venous Doppler and ABI are completed    Brief narrative: 81 y/o F with a past medical history of breast cancer (remote), dementia, chronic diastolic CHF (baseline Lasix 20 mg BID), aortic stenosis and severely calcified mitral valve with moderate stenosis, gangrenous toes on the right foot (followed by Martha Mcbride), polymyalgia rheumatica on prednisone (20 mg QD), interstitial lung disease / presumed IPF, large hiatal hernia, severe dysphasia status post PEG tube placement, multiple admissions for aspiration pneumonia, chronic diarrhea, who is chair/bed bound at baseline (followed at the wound clinic at [facility]). She is cared for at home by her daughters Martha Mcbride and Martha Mcbride.   Chart review notes that they call the cardiology office on 1/17 with concerns for tachycardia and lethargy. The daughters medicated the patient with an additional dose of Lopressor. They returned to the cardiology office for an acute visit on 1/18 with a three-day history of progressive weakness and fatigue. Notes reflect that a urinalysis was obtained and the family was encouraged to send the patient to the hospital for evaluation. She was treated with Cipro for UTI. However, they obtained a urinalysis and then proceeded to leave the office. They called the office to let them know that they were taking her home. Attempts were made to contact her via phone & messages were  left at that time.   On 1/19 the patient presented to the  [facility] emergency room via EMS with progressive confusion, lethargy and tachypnea. Initial BP 118/65, rate 133, rectal temperature of 101.2, RR 31 and SPO2 94%. Initial lab work notable for hgb 15.5, Na 151, CL 1:15, BUN 54, sr cr on 0.17, albumin 2.9, glucose 735 and lactic acid of 6.69. Repeat urinalysis on 1/19 with glucose greater than 1000, negative nitrite, negative leukocytes, 3-6 WBC, and few bacteria. CXR was negative for acute infiltrate. She was started on an insulin drip in the emergency room and treated with IV abx for potential wound infection/UTI. Of note, her caretakers have apparently had something similar to a viral gastroenteritis. PCCM consulted for ICU admission.  Consultants:  Critical care  Procedures:  None   Antiinfectives  1/19 >>aztreonam >> 1/22 1/19 >>vancomycin >> 1/22 1/19 >>metronidazole >> 1/22 1/19 >>levofloxacin >> 1/19 1/22 >> ciprofloxacin >> 1/22 > clindamycin >>  HPI/Subjective: One episode of hypoglycemia at 4 AM this morning with a CBG of 63. Otherwise no complaints denies any nausea vomiting abdominal pain,   Objective: Filed Vitals:   10/19/14 2050 10/20/14 0415 10/20/14 0658 10/20/14 1013  BP:  104/50 114/69 114/55  Pulse: 78 84 89 87  Temp:  98.2 F (36.8 C) 98.3 F (36.8 C)   TempSrc:  Oral    Resp:  18 20   Height:      Weight:  52.663 kg (116 lb 1.6 oz)    SpO2:  97% 100%     Intake/Output Summary (Last 24 hours) at 10/20/14 1050 Last data filed at 10/20/14 0555  Gross per 24 hour  Intake 1749.25 ml  Output   1300 ml  Net 449.25 ml    Exam: General: Frail elderly female, awake/pleasant  Neuro: Awake/alert, oriented to self, place & events, MAE, generalized weakness  HEENT: Mm pink/dry, no jvd, dentures in place  Cardiovascular: s1s2 rrr, 3/6 SEM Lungs: Even/non-labored, lungs bilaterally with fine basilar crackles Abdomen: PEG c/d/i, NTND Musculoskeletal: No acute deformities  Skin: Warm/dry,  good cap refills, 1+ pitting edema in BLE, sacral/rectal wound erythematous, ~ stage 3 with rectal excoriation with yellow/brown crusting & drainage, deep fissure like open wound approx 3 inches in length in intergluteal cleft. R foot 2nd/3rd toes with chronic dry gangrene (unchanged from prior admits)       Data Reviewed: Basic Metabolic Panel:  Recent Labs Lab 10/17/14 0409 10/18/14 0620 10/19/14 1100 10/19/14 1105 10/20/14 0425 10/20/14 0653  NA 156* 148*  --  143 142 142  K 3.9 3.0*  --  3.4* 5.8* 3.8  CL 124* 119*  --  115* 117* 115*  CO2 23 23  --  23 20 21   GLUCOSE 165* 140*  --  159* 76 179*  BUN 34* 28*  --  18 14 15   CREATININE 0.62 0.62  --  0.61 0.52 0.54  CALCIUM 7.7* 7.5*  --  7.2* 7.0* 7.1*  MG 2.3  --  2.0  --   --   --   PHOS 3.0  --   --   --   --   --     Liver Function Tests:  Recent Labs Lab 10/16/14 0756 10/19/14 1105 10/20/14 0425 10/20/14 0653  AST 36 22 60* 24  ALT 22 17 10 18   ALKPHOS 127* 117 101 110  BILITOT 1.1 0.5 2.4* 0.4  PROT 6.1 5.1* 4.1* 4.7*  ALBUMIN 2.9* 2.3* 2.0* 2.2*  No results for input(s): LIPASE, AMYLASE in the last 168 hours. No results for input(s): AMMONIA in the last 168 hours.  CBC:  Recent Labs Lab 10/16/14 0756 10/17/14 0409 10/18/14 0620 10/19/14 1105  WBC 8.9 8.4 7.7 7.2  NEUTROABS 5.4  --   --   --   HGB 15.5*  17.0* 11.6* 11.6* 12.0  HCT 50.6*  50.0* 37.5 38.1 38.7  MCV 105.4* 101.6* 103.0* 102.9*  PLT 137* 96* 83* 72*    Cardiac Enzymes:  Recent Labs Lab 10/16/14 0756  TROPONINI 0.03   BNP (last 3 results)  Recent Labs  07/19/14 2039 07/28/14 1225 07/29/14 0330  PROBNP 1467.0* 576.5* 753.4*     CBG:  Recent Labs Lab 10/19/14 2007 10/19/14 2340 10/20/14 0405 10/20/14 0521 10/20/14 0745  GLUCAP 338* 143* 63* 129* 135*    Recent Results (from the past 240 hour(s))  Blood Culture (routine x 2)     Status: None (Preliminary result)   Collection Time: 10/16/14  7:39 AM   Result Value Ref Range Status   Specimen Description BLOOD RIGHT HAND  Final   Special Requests BOTTLES DRAWN AEROBIC AND ANAEROBIC 3ML  Final   Culture   Final           BLOOD CULTURE RECEIVED NO GROWTH TO DATE CULTURE WILL BE HELD FOR 5 DAYS BEFORE ISSUING A FINAL NEGATIVE REPORT Performed at [facility]    Report Status PENDING  Incomplete  Blood Culture (routine x 2)     Status: None (Preliminary result)   Collection Time: 10/16/14  7:55 AM  Result Value Ref Range Status   Specimen Description BLOOD RIGHT ANTECUBITAL  Final   Special Requests BOTTLES DRAWN AEROBIC AND ANAEROBIC 5ML  Final   Culture   Final           BLOOD CULTURE RECEIVED NO GROWTH TO DATE CULTURE WILL BE HELD FOR 5 DAYS BEFORE ISSUING A FINAL NEGATIVE REPORT Performed at [facility]    Report Status PENDING  Incomplete  Wound culture     Status: None   Collection Time: 10/16/14  8:25 AM  Result Value Ref Range Status   Specimen Description WOUND VAGINA  Final   Special Requests NONE  Final   Gram Stain   Final    FEW WBC PRESENT,BOTH PMN AND MONONUCLEAR NO SQUAMOUS EPITHELIAL CELLS SEEN RARE GRAM POSITIVE COCCI IN PAIRS IN CLUSTERS RARE GRAM NEGATIVE RODS Performed at [facility]    Culture   Final    FEW YEAST CONSISTENT WITH CANDIDA SPECIES Performed at [facility]    Report Status 10/18/2014 FINAL  Final  Urine culture     Status: None   Collection Time: 10/16/14  8:27 AM  Result Value Ref Range Status   Specimen Description URINE, CATHETERIZED  Final   Special Requests NONE  Final   Colony Count NO GROWTH Performed at [facility]   Final   Culture NO GROWTH Performed at [facility]   Final   Report Status 10/17/2014 FINAL  Final  MRSA PCR Screening     Status: None   Collection Time: 10/16/14  1:30 PM  Result Value Ref Range Status   MRSA by PCR NEGATIVE NEGATIVE Final    Comment:        The GeneXpert MRSA Assay (FDA approved for  NASAL specimens only), is one component of a comprehensive MRSA colonization surveillance program. It is not intended to diagnose MRSA infection  nor to guide or monitor treatment for MRSA infections.   Clostridium Difficile by PCR     Status: None   Collection Time: 10/18/14  9:50 AM  Result Value Ref Range Status   C difficile by pcr NEGATIVE NEGATIVE Final    Comment: Performed at [facility]     Studies: Ct Abdomen Pelvis Wo Contrast  10/16/2014   CLINICAL DATA:  Decubitus ulcer  EXAM: CT ABDOMEN AND PELVIS WITHOUT CONTRAST  TECHNIQUE: Multidetector CT imaging of the abdomen and pelvis was performed following the standard protocol without IV contrast.  COMPARISON:  05/21/2012  FINDINGS: BODY WALL:  There is ulceration of the mid gluteal cleft with granulation tissue or other soft tissue extends to contact the sacrum, appearance similar to previous. There are no sacral changes of osteomyelitis. Fissured appearance of the lower gluteal cleft, extending towards the anus. There is skin thickening and subcutaneous reticulation in the lower left gluteal region which likely also represents pressure changes. No evidence of tract.  Lumpectomy in the lower right breast. Surgical size stable from prior.  LOWER CHEST: There is a moderate sliding-type hiatal hernia. Detection of esophageal or gastric thickening not possible due to under distention and lack of IV contrast.  Bulky mitral annular calcification noted. Chronic subpleural reticulation in the lower lungs.  ABDOMEN/PELVIS:  Liver: Small cysts again noted in the liver.  Biliary: Cholelithiasis. Gallbladder is full but there is no inflammatory change.  Pancreas: Unremarkable.  Spleen: Unremarkable.  Adrenals: Unremarkable.  Kidneys and ureters: 2 small high-density areas within the lower right kidney likely reflects small hemorrhagic cyst. Small low-density foci in the more cranial right renal cortex is likely also cysts.  Bladder:  Decompressed by Foley catheter.  Reproductive: Hysterectomy. Decreased size of a cyst in the right hemipelvis, currently 4.8 cm previously 6.4 cm. As noted previously this could be an ovarian or peritoneal cyst.  Bowel: Sliding hiatal hernia. There is percutaneous gastrostomy tube which is in good position. No pericecal inflammation.  Retroperitoneum: No mass or adenopathy.  Peritoneum: No ascites or pneumoperitoneum.  Vascular: No acute abnormality.  OSSEOUS: Healed insufficiency fractures of the right obturator ring. Diffuse and advanced lumbar degenerative disc disease with levoscoliosis. No acute fracture.  IMPRESSION: 1. Decubitus ulcers, the deepest in the gluteal cleft. No abscess, undermining, or osteomyelitis identified. 2. Chronic findings are stable from 2013 and noted above.   Electronically Signed   By: Jorje Guild M.D.   On: 10/16/2014 22:03   Dg Chest 1 View  09/26/2014   CLINICAL DATA:  Pneumonia.  EXAM: CHEST - 1 VIEW  COMPARISON:  09/14/2014.  02/10/2014.  FINDINGS: Mediastinum and hilar structures normal. Cardiomegaly with normal pulmonary vascularity. No focal pulmonary infiltrate. Chronic interstitial prominence. No pleural effusion or pneumothorax. Degenerative changes thoracic spine. Sliding hiatal hernia.  IMPRESSION: 1. Cardiomegaly.  No overt pulmonary edema. 2. Chronic interstitial lung disease. No focal pulmonary infiltrate.   Electronically Signed   By: Marcello Moores  Register   On: 09/26/2014 11:36   Dg Chest Port 1 View  10/16/2014   CLINICAL DATA:  Shortness of breath and lethargy  EXAM: PORTABLE CHEST - 1 VIEW  COMPARISON:  September 26, 2014  FINDINGS: There is no edema or consolidation. Heart size and pulmonary vascularity are normal. No adenopathy. There is calcification in the mitral annulus. There is degenerative change in the lower thoracic and upper lumbar region with upper lumbar levoscoliosis.  IMPRESSION: No edema or consolidation.   Electronically Signed   By:  Lowella Grip M.D.   On: 10/16/2014 08:16    Scheduled Meds: . aspirin  81 mg Per Tube Daily  . ciprofloxacin  500 mg Per Tube BID  . clindamycin  300 mg Per Tube 3 times per day  . collagenase   Topical Daily  . enoxaparin (LOVENOX) injection  30 mg Subcutaneous Q24H  . folic acid  1 mg Per Tube Daily  . free water  200 mL Per Tube Q6H  . furosemide  20 mg Per Tube BID  . glyBURIDE  2.5 mg Oral Q breakfast  . insulin aspart  0-20 Units Subcutaneous 5 X Daily  . insulin glargine  5 Units Subcutaneous QHS  . lidocaine  1 application Topical Daily  . metoprolol tartrate  6.25 mg Per Tube BID  . PERATIVE  120 mL Oral 5 X Daily  . potassium chloride  20 mEq Per Tube Daily  . predniSONE  20 mg Per Tube Daily  . ranitidine  150 mg Per Tube QHS  . saccharomyces boulardii  250 mg Oral BID   Continuous Infusions: . dextrose 5 % and 0.9 % NaCl with KCl 40 mEq/L 75 mL/hr at 10/20/14 0138    Active Problems:   Sepsis   Severe sepsis   New onset type 2 diabetes mellitus   UTI (lower urinary tract infection)   Sacral wound   Hyperglycemia   Protein-calorie malnutrition, severe   Diabetic hyperosmolar non-ketotic state   Decubitus skin ulcer    Time spent: 40 minutes   [facility] Pager [phone]. If 7PM-7AM, please contact night-coverage at [email or web address], password [facility] 10/20/2014, 10:50 AM  LOS: 4 days

## 2014-10-21 LAB — CBC
HEMATOCRIT: 37.9 % (ref 36.0–46.0)
Hemoglobin: 12 g/dL (ref 12.0–15.0)
MCH: 31.5 pg (ref 26.0–34.0)
MCHC: 31.7 g/dL (ref 30.0–36.0)
MCV: 99.5 fL (ref 78.0–100.0)
Platelets: 99 10*3/uL — ABNORMAL LOW (ref 150–400)
RBC: 3.81 MIL/uL — ABNORMAL LOW (ref 3.87–5.11)
RDW: 16.6 % — ABNORMAL HIGH (ref 11.5–15.5)
WBC: 6.6 10*3/uL (ref 4.0–10.5)

## 2014-10-21 LAB — VITAMIN B12: Vitamin B-12: 743 pg/mL (ref 211–911)

## 2014-10-21 LAB — GLUCOSE, CAPILLARY
Glucose-Capillary: 138 mg/dL — ABNORMAL HIGH (ref 70–99)
Glucose-Capillary: 151 mg/dL — ABNORMAL HIGH (ref 70–99)
Glucose-Capillary: 203 mg/dL — ABNORMAL HIGH (ref 70–99)
Glucose-Capillary: 294 mg/dL — ABNORMAL HIGH (ref 70–99)
Glucose-Capillary: 73 mg/dL (ref 70–99)

## 2014-10-21 LAB — PROTIME-INR
INR: 1.04 (ref 0.00–1.49)
PROTHROMBIN TIME: 13.7 s (ref 11.6–15.2)

## 2014-10-21 MED ORDER — ACETAMINOPHEN 325 MG PO TABS
650.0000 mg | ORAL_TABLET | Freq: Four times a day (QID) | ORAL | Status: DC | PRN
  Administered 2014-10-21 (×2): 650 mg via ORAL
  Filled 2014-10-21 (×2): qty 2

## 2014-10-21 MED ORDER — WARFARIN SODIUM 2.5 MG PO TABS
2.5000 mg | ORAL_TABLET | Freq: Once | ORAL | Status: AC
  Administered 2014-10-21: 2.5 mg via ORAL
  Filled 2014-10-21: qty 1

## 2014-10-21 MED ORDER — PRO-STAT SUGAR FREE PO LIQD
30.0000 mL | Freq: Two times a day (BID) | ORAL | Status: DC
  Administered 2014-10-21 – 2014-10-26 (×8): 30 mL
  Filled 2014-10-21 (×13): qty 30

## 2014-10-21 MED ORDER — PERATIVE PO LIQD
180.0000 mL | Freq: Every day | ORAL | Status: DC
  Administered 2014-10-21 – 2014-10-22 (×5): 180 mL via ORAL

## 2014-10-21 MED ORDER — COUMADIN BOOK
Freq: Once | Status: AC
  Administered 2014-10-21: 18:00:00
  Filled 2014-10-21: qty 1

## 2014-10-21 MED ORDER — VITAMINS A & D EX OINT
TOPICAL_OINTMENT | CUTANEOUS | Status: AC
  Administered 2014-10-21: 16:00:00
  Filled 2014-10-21: qty 5

## 2014-10-21 MED ORDER — WARFARIN - PHARMACIST DOSING INPATIENT: Freq: Every day | Status: DC

## 2014-10-21 MED ORDER — PRO-STAT SUGAR FREE PO LIQD
30.0000 mL | Freq: Every morning | ORAL | Status: DC
  Administered 2014-10-21: 30 mL
  Filled 2014-10-21: qty 30

## 2014-10-21 MED ORDER — WARFARIN VIDEO
Freq: Once | Status: AC
  Administered 2014-10-21: 1

## 2014-10-21 MED ORDER — PERATIVE PO LIQD
240.0000 mL | Freq: Every day | ORAL | Status: DC
  Administered 2014-10-21: 180 mL via ORAL

## 2014-10-21 MED ORDER — PERATIVE PO LIQD: 180.0000 mL | Freq: Every day | ORAL | Status: DC

## 2014-10-21 MED ORDER — ONDANSETRON HCL 4 MG/5ML PO SOLN
4.0000 mg | Freq: Once | ORAL | Status: DC
  Filled 2014-10-21: qty 5

## 2014-10-21 NOTE — Progress Notes (Addendum)
NUTRITION FOLLOW-UP/TF consult  INTERVENTION: -Increased Perative via PEG, 120 ml (1/2 can) five times daily (6 am, 10 am, 2 pm, 6 pm, 10 pm) to home regimen of  Perative formula, bolus feeds of 8 oz at 6am, then 7 oz at 10am, 2 pm, 6 pm, and 10 pm. -30 ml Prostat daily This provides 1488 kcal, 87g protein and 843 ml of H2O.  -Monitor Blood Glucose -RD to follow-up daily   ADDENDUM: Per RN, pt's family would like TF to advance to 180 ml (6oz) 5 times daily. RD changed order. Pt's family informed RN that pt was receiving ProMod BID at home, RD changed order of Prostat to BID to mirror routine at home.  Current TF regimen provides 1355 kcal (85% of needs), 89g of protein (100% of needs) and 701 ml of H2O.  NUTRITION DIAGNOSIS: Inadequate oral intake related to inability to eat as evidenced by NPO status, ongoing.  Goal: Pt to meet >/= 90% of their estimated nutrition needs, unmet   Monitor:  TF regimen & tolerance, weight, labs, I/O's  ASSESSMENT: 81 y.o female who on 1/19  presented to the [facility] emergency room via EMS with progressive confusion, lethargy and tachypnea.  1/20: -Pt familiar to this RD from previous admissions (07/30/14-08/10/14, 08/25/14-09/05/14). -Pt was admitted with CBGs in the 700s, HgbA1c of 8.2 and pt has been experiencing some diarrhea which stopped when TF was held. -Per granddaughter, pt on home TF regimen, Perative formula, bolus feeds of 8 oz at 6am, then 7 oz at 10am, 2 pm, 6 pm, and 10 pm. This provides. 1388 kcal, 72g protein and 843 ml of H2O. Family would prefer to continue with the same formula as they saw less diarrhea and side effects with this formula vs. Jevity 1.2. -Spoke with pharmacy about ordering this product, per policy it should be provided within 72 hours. Family has opted to provide supply of Perative formula from home. RD to order TF initiation and TF to start when family supplies formula.  1/21 -Pt is tolerating 1/2 doses of  Perative TF 5 times daily. -Will monitor tolerance a little longer and advance to home regimen. -Pt with some diarrhea since TF started, c diff pending. -Spoke with granddaughter at bedside, reports no issues currently. Has no questions at this time.  1/22: - Pt tolerating 1/2 doses of TF at this time. Per RN, pt without diarrhea, but stools are soft. Pt's family member had questions about pt's blood sugar being high. Concerned whether TF formula should be changed. RD recommended to stay on the current formula through the weekend and monitor blood glucose. Pt with history of severe diarrhea with formula change.   1/24 -Pt tolerating TF at this time. Pt's TF will be advanced to 5 cans of Perative daily. -Spoke with pt's daughter [name]. Per daughter, pt was receiving ProMod supplement in addition to [employer]. RD to order 30 ml Prostat daily to better meet protein requirements. -Daughter was upset during visit, had questions as to why pharmacy did not provide TF formula, why TF was advanced so slowly, and why pt's albumin is low. RD answered all questions. Explained to pt's family that recommendations were made in RD note 1/22 but order was not changed. Apologized and assured daughter that we would be following daily and would advance TF today.  -When asked if the family wanted pharmacy to order additional cans of Perative formula, family declined. -CBGs: 73-193 mg/dL  Labs: Mg/Phos/K WNL  Height: Ht Readings from Last  1 Encounters:  10/16/14 5\' 2"  (1.575 m)    Weight: Wt Readings from Last 1 Encounters:  10/21/14 116 lb 3.2 oz (52.708 kg)    BMI:  Body mass index is 21.25 kg/(m^2).  Estimated Nutritional Needs: Kcal: 1600-1800 Protein: 85-95g Fluid: 1.6L/day  Skin: gangrenous toes, stage 3 sacral pressure ulcer, +2 RLE, +2 LLE    Diet Order: Diet NPO time specified  EDUCATION NEEDS: -Education needs addressed   Intake/Output Summary (Last 24 hours) at 10/21/14 1150 Last data  filed at 10/21/14 0200  Gross per 24 hour  Intake 576.67 ml  Output   1600 ml  Net -1023.33 ml    Last BM: 1/24  Labs:   Recent Labs Lab 10/17/14 0409  10/19/14 1100 10/19/14 1105 10/20/14 0425 10/20/14 0653  NA 156*  < >  --  143 142 142  K 3.9  < >  --  3.4* 5.8* 3.8  CL 124*  < >  --  115* 117* 115*  CO2 23  < >  --  23 20 21   BUN 34*  < >  --  18 14 15   CREATININE 0.62  < >  --  0.61 0.52 0.54  CALCIUM 7.7*  < >  --  7.2* 7.0* 7.1*  MG 2.3  --  2.0  --   --   --   PHOS 3.0  --   --   --   --   --   GLUCOSE 165*  < >  --  159* 76 179*  < > = values in this interval not displayed.  CBG (last 3)   Recent Labs  10/20/14 1956 10/20/14 2353 10/21/14 0442  GLUCAP 193* 138* 73    Scheduled Meds: . aspirin  81 mg Per Tube Daily  . ciprofloxacin  500 mg Per Tube BID  . clindamycin  300 mg Per Tube 3 times per day  . collagenase   Topical Daily  . enoxaparin (LOVENOX) injection  50 mg Subcutaneous Q12H  . folic acid  1 mg Per Tube Daily  . free water  200 mL Per Tube Q6H  . furosemide  20 mg Per Tube BID  . insulin aspart  0-20 Units Subcutaneous 5 X Daily  . lidocaine  1 application Topical Daily  . metoprolol tartrate  6.25 mg Per Tube BID  . nystatin  5 mL Oral QID  . PERATIVE  120 mL Oral 5 X Daily  . potassium chloride  20 mEq Per Tube Daily  . predniSONE  20 mg Per Tube Daily  . ranitidine  150 mg Per Tube QHS  . saccharomyces boulardii  250 mg Oral BID    Continuous Infusions:    Clayton Bibles, MS, RD, LDN Pager: [phone] After Hours Pager: [phone]

## 2014-10-21 NOTE — Progress Notes (Signed)
ANTICOAGULATION CONSULT NOTE - Follow-up Consult  Pharmacy Consult for Lovenox / Warfarin bridge Indication: DVT  Allergies  Allergen Reactions  . Doxycycline Nausea And Vomiting  . Other Nausea And Vomiting and Other (See Comments)    Anesthesia makes her very sleepy and makes it hard for her to come out of it.   Marland Kitchen Penicillins Hives and Nausea And Vomiting  . Sulfa Antibiotics Nausea And Vomiting    Patient Measurements: Height: 5\' 2"  (157.5 cm) Weight: 116 lb 3.2 oz (52.708 kg) IBW/kg (Calculated) : 50.1 Heparin Dosing Weight: n/a  Vital Signs: Temp: 97.7 F (36.5 C) (01/24 0444) Temp Source: Oral (01/24 0444) BP: 117/69 mmHg (01/24 1011) Pulse Rate: 83 (01/24 1011)  Labs:  Recent Labs  10/19/14 1105 10/20/14 0425 10/20/14 0653 10/21/14 0426  HGB 12.0  --   --  12.0  HCT 38.7  --   --  37.9  PLT 72*  --   --  99*  CREATININE 0.61 0.52 0.54  --     Estimated Creatinine Clearance: 39.2 mL/min (by C-G formula based on Cr of 0.54).   Medical History: Past Medical History  Diagnosis Date  . Arthritis   . Polymyalgia   . Acid reflux   . Immune deficiency disorder     autoimmune disorder - polymyalgia - on prednisone  . Breast cancer     Treated with lumpectomy and radiation  . Giardia   . Pinworms   . History of hiatal hernia   . Pneumonia   . Dementia   . CHF (congestive heart failure)   . Dysrhythmia     Medications:  Scheduled:  . aspirin  81 mg Per Tube Daily  . ciprofloxacin  500 mg Per Tube BID  . clindamycin  300 mg Per Tube 3 times per day  . collagenase   Topical Daily  . enoxaparin (LOVENOX) injection  50 mg Subcutaneous Q12H  . folic acid  1 mg Per Tube Daily  . free water  200 mL Per Tube Q6H  . furosemide  20 mg Per Tube BID  . insulin aspart  0-20 Units Subcutaneous 5 X Daily  . lidocaine  1 application Topical Daily  . metoprolol tartrate  6.25 mg Per Tube BID  . nystatin  5 mL Oral QID  . PERATIVE  120 mL Oral 5 X Daily  .  potassium chloride  20 mEq Per Tube Daily  . predniSONE  20 mg Per Tube Daily  . ranitidine  150 mg Per Tube QHS  . saccharomyces boulardii  250 mg Oral BID    Assessment: 89yoF admitted to 1/19 for sepsis from wound infection vs UTI.  Now found to have new bilateral DVT.  Noted with thrombocytopenia, possibly from sepsis vs ranitidine.  Suspected HIT during last admission, but HIT panel returned negative.  PMH  CHF, ILD, hiatal hernia s/p PEG tube placement, dementia.  Changed from prophylactic lovenox to treatment dose lovenox on 1/24.   Pharmacy consulted to start warfarin bridge on 1/24.    1/24:  No PT/INR this admission.  Note 09/13/14 -INR 1.08 CBC: Thrombocytopenia 2/2 sepsis, plts improved today, hgb ok No bleeding issues noted Moderate renal impairment; low SCr (chair/bed bound at baseline); CrCl 39 CG using SCr 0.8 Note also on ASA 81 and ciprofloxacin (which may cause elevations in INR) Low albumin, elderly, low body weight, NPO therefore will start conservative dosing of warfarin   Goal of Therapy:  Anti-Xa level 0.6-1 units/ml 4hrs after  LMWH dose given Monitor platelets by anticoagulation protocol: Yes  INR 2 - 3   Plan:   Continue Lovenox 50 mg SQ q12h  Warfarin 2.5mg  po x 1 tonight  CBC, SCr at least q72 hrs while on Lovenox  Daily PT/INR  F/u renal function, resolution of DVT symptoms, and for signs of bleeding  Warfarin video/book, pharmacy will provide education   Ralene Bathe, PharmD, BCPS 10/21/2014, 11:56 AM  Pager: [phone]

## 2014-10-21 NOTE — Progress Notes (Signed)
This Probation officer asked Dr. Allyson Sabal if we need to get another peripheral line sinec patient's IV sites are outdated and she ordered to discontinue IV's. Sandie Ano RN

## 2014-10-21 NOTE — Progress Notes (Addendum)
[facility] PROGRESS NOTE  Martha Mcbride IOE:[id number] DOB: 07-22-1925 DOA: 10/16/2014 PCP: Kandice Hams, MD  Assessment/Plan: Active Problems:   Sepsis   Severe sepsis   New onset type 2 diabetes mellitus   UTI (lower urinary tract infection)   Sacral wound   Hyperglycemia   Protein-calorie malnutrition, severe   Diabetic hyperosmolar non-ketotic state   Decubitus skin ulcer    Sacral decubitus ulcers/UTI BCx2 1/19 >> no growth so far UC 1/19 >> neg C-Diff 1/19 >> negative Norovirus 1/19 >> pending possible hyperbaric therapy at the wound care center once the patient is discharged. Continue ciprofloxacin for UTI. Clindamycin added for cellulitis , continue until 1/29, a total of 10 days since admission Continue  florastor  vancomycin aztreonam-discontinued 1/22 CT negative for abscess.  Consider hyperbaric Rx as an outpatient    Bilateral DVT,Right: DVT noted in the FV and popliteal vein. Left: DVT noted in the CFV, FV, popliteal vein We will also check an ABI given history of gangrenous toes on the right foot Started on Lovenox weight-based, will start coumadin, family agreeable   interstitial lung diseasepiration risk Chest x-ray negative upon admission Continue Aspiration precautions, pulmonary hygiene continue tube feedings  Thrush Started on nystatin swish and spit    Sepsis, secondary to sacral decubitus ulcer/UTI Now improved, discontinued broad-spectrum antibiotics and switched to ciprofloxacin, clindamycin , start date 1/29 Stress dose steroids until 1/20 Continue maintenance dose steroids , this is a continuation of her home medication   Hypertension,AS - moderate on TEE 07/20/14 & calcified MV Continue Lopressor, continue low-dose Lasix   Hypernatremia, hyperkalemia , hypokalemia, acute kidney injury All improving Continue free water through feeding tube Continue Foley catheter given perineal ulcers Follow  BMP   Hyperglycemia,DM 2  Discontinue D5 We have discontinued Lantus , glipizide as this is invariably caused 4 AM hypoglycemia Continue SSI with tube feeding boluses, timing adjusted   Severe Dysphagia - s/p PEG, NPO at baseline (since late fall 2015). In setting of large hiatal hernia. They have been doing evaluations with SLP for swallowing at home despite NPO status / rec's with concern for aspiration.  Continue home PEG tube feeding, nutrition reconsulted toadvance feeding Hold Imodium, C. difficile negative Continue florastor    Thrombocytopenia likely secondary to sepsis Follow CBC daily as the patient is on Lovenox     Code Status: full Family Communication: family updated about patient's clinical progress, spent 30 mins discussing with daughter [name] in the room Disposition Plan: PT/OT evaluation pending    Brief narrative: 81 y/o F with a past medical history of breast cancer (remote), dementia, chronic diastolic CHF (baseline Lasix 20 mg BID), aortic stenosis and severely calcified mitral valve with moderate stenosis, gangrenous toes on the right foot (followed by Dr. Oneida Alar), polymyalgia rheumatica on prednisone (20 mg QD), interstitial lung disease / presumed IPF, large hiatal hernia, severe dysphasia status post PEG tube placement, multiple admissions for aspiration pneumonia, chronic diarrhea, who is chair/bed bound at baseline (followed at the wound clinic at [facility]). She is cared for at home by her daughters [name] and [name].   Chart review notes that they call the cardiology office on 1/17 with concerns for tachycardia and lethargy. The daughters medicated the patient with an additional dose of Lopressor. They returned to the cardiology office for an acute visit on 1/18 with a three-day history of progressive weakness and fatigue. Notes reflect that a urinalysis was obtained and the family was encouraged to send the patient to  the hospital for evaluation. She  was treated with Cipro for UTI. However, they obtained a urinalysis and then proceeded to leave the office. They called the office to let them know that they were taking her home. Attempts were made to contact her via phone & messages were left at that time.   On 1/19 the patient presented to the [facility] emergency room via EMS with progressive confusion, lethargy and tachypnea. Initial BP 118/65, rate 133, rectal temperature of 101.2, RR 31 and SPO2 94%. Initial lab work notable for hgb 15.5, Na 151, CL 1:15, BUN 54, sr cr on 0.17, albumin 2.9, glucose 735 and lactic acid of 6.69. Repeat urinalysis on 1/19 with glucose greater than 1000, negative nitrite, negative leukocytes, 3-6 WBC, and few bacteria. CXR was negative for acute infiltrate. She was started on an insulin drip in the emergency room and treated with IV abx for potential wound infection/UTI. Of note, her caretakers have apparently had something similar to a viral gastroenteritis. PCCM consulted for ICU admission.  Consultants:  Critical care  Procedures:  None   Antiinfectives  1/19 >>aztreonam >> 1/22 1/19 >>vancomycin >> 1/22 1/19 >>metronidazole >> 1/22 1/19 >>levofloxacin >> 1/19 1/22 >> ciprofloxacin >> 1/22 > clindamycin >>  HPI/Subjective: Patient complained of pain yesterday in the buttock area that is been improved with IV Dilaudid no nausea vomiting, left leg swelling is improving  Objective: Filed Vitals:   10/20/14 1705 10/20/14 2157 10/21/14 0444 10/21/14 1011  BP: 108/51 112/52 109/55 117/69  Pulse: 88 77 71 83  Temp:  98 F (36.7 C) 97.7 F (36.5 C)   TempSrc:  Oral Oral   Resp:  20 20   Height:      Weight:   52.708 kg (116 lb 3.2 oz)   SpO2: 98% 100% 100%     Intake/Output Summary (Last 24 hours) at 10/21/14 1012 Last data filed at 10/21/14 0200  Gross per 24 hour  Intake 576.67 ml  Output   2100 ml  Net -1523.33 ml    Exam: General: Frail elderly female, awake/pleasant   Neuro: Awake/alert, oriented to self, place & events, MAE, generalized weakness  HEENT: Mm pink/dry, no jvd, dentures in place  Cardiovascular: s1s2 rrr, 3/6 SEM Lungs: Even/non-labored, lungs bilaterally with fine basilar crackles Abdomen: PEG c/d/i, NTND Musculoskeletal: Left lower extremity erythema improved Skin: Warm/dry, good cap refills, 1+ pitting edema in BLE, sacral/rectal wound erythematous, ~ stage 3 with rectal excoriation with yellow/brown crusting & drainage, deep fissure like open wound approx 3 inches in length in intergluteal cleft. R foot 2nd/3rd toes with chronic dry gangrene (unchanged from prior admits)       Data Reviewed: Basic Metabolic Panel:  Recent Labs Lab 10/17/14 0409 10/18/14 0620 10/19/14 1100 10/19/14 1105 10/20/14 0425 10/20/14 0653  NA 156* 148*  --  143 142 142  K 3.9 3.0*  --  3.4* 5.8* 3.8  CL 124* 119*  --  115* 117* 115*  CO2 23 23  --  23 20 21   GLUCOSE 165* 140*  --  159* 76 179*  BUN 34* 28*  --  18 14 15   CREATININE 0.62 0.62  --  0.61 0.52 0.54  CALCIUM 7.7* 7.5*  --  7.2* 7.0* 7.1*  MG 2.3  --  2.0  --   --   --   PHOS 3.0  --   --   --   --   --     Liver Function Tests:  Recent Labs Lab 10/16/14 0756 10/19/14 1105 10/20/14 0425 10/20/14 0653  AST 36 22 60* 24  ALT 22 17 10 18   ALKPHOS 127* 117 101 110  BILITOT 1.1 0.5 2.4* 0.4  PROT 6.1 5.1* 4.1* 4.7*  ALBUMIN 2.9* 2.3* 2.0* 2.2*   No results for input(s): LIPASE, AMYLASE in the last 168 hours. No results for input(s): AMMONIA in the last 168 hours.  CBC:  Recent Labs Lab 10/16/14 0756 10/17/14 0409 10/18/14 0620 10/19/14 1105 10/21/14 0426  WBC 8.9 8.4 7.7 7.2 6.6  NEUTROABS 5.4  --   --   --   --   HGB 15.5*  17.0* 11.6* 11.6* 12.0 12.0  HCT 50.6*  50.0* 37.5 38.1 38.7 37.9  MCV 105.4* 101.6* 103.0* 102.9* 99.5  PLT 137* 96* 83* 72* 99*    Cardiac Enzymes:  Recent Labs Lab 10/16/14 0756  TROPONINI 0.03   BNP (last 3  results)  Recent Labs  07/19/14 2039 07/28/14 1225 07/29/14 0330  PROBNP 1467.0* 576.5* 753.4*     CBG:  Recent Labs Lab 10/20/14 1159 10/20/14 1555 10/20/14 1956 10/20/14 2353 10/21/14 0442  GLUCAP 214* 283* 193* 138* 73    Recent Results (from the past 240 hour(s))  Blood Culture (routine x 2)     Status: None (Preliminary result)   Collection Time: 10/16/14  7:39 AM  Result Value Ref Range Status   Specimen Description BLOOD RIGHT HAND  Final   Special Requests BOTTLES DRAWN AEROBIC AND ANAEROBIC 3ML  Final   Culture   Final           BLOOD CULTURE RECEIVED NO GROWTH TO DATE CULTURE WILL BE HELD FOR 5 DAYS BEFORE ISSUING A FINAL NEGATIVE REPORT Performed at [facility]    Report Status PENDING  Incomplete  Blood Culture (routine x 2)     Status: None (Preliminary result)   Collection Time: 10/16/14  7:55 AM  Result Value Ref Range Status   Specimen Description BLOOD RIGHT ANTECUBITAL  Final   Special Requests BOTTLES DRAWN AEROBIC AND ANAEROBIC 5ML  Final   Culture   Final           BLOOD CULTURE RECEIVED NO GROWTH TO DATE CULTURE WILL BE HELD FOR 5 DAYS BEFORE ISSUING A FINAL NEGATIVE REPORT Performed at [facility]    Report Status PENDING  Incomplete  Wound culture     Status: None   Collection Time: 10/16/14  8:25 AM  Result Value Ref Range Status   Specimen Description WOUND VAGINA  Final   Special Requests NONE  Final   Gram Stain   Final    FEW WBC PRESENT,BOTH PMN AND MONONUCLEAR NO SQUAMOUS EPITHELIAL CELLS SEEN RARE GRAM POSITIVE COCCI IN PAIRS IN CLUSTERS RARE GRAM NEGATIVE RODS Performed at [facility]    Culture   Final    FEW YEAST CONSISTENT WITH CANDIDA SPECIES Performed at [facility]    Report Status 10/18/2014 FINAL  Final  Urine culture     Status: None   Collection Time: 10/16/14  8:27 AM  Result Value Ref Range Status   Specimen Description URINE, CATHETERIZED  Final   Special Requests NONE   Final   Colony Count NO GROWTH Performed at [facility]   Final   Culture NO GROWTH Performed at [facility]   Final   Report Status 10/17/2014 FINAL  Final  MRSA PCR Screening     Status: None  Collection Time: 10/16/14  1:30 PM  Result Value Ref Range Status   MRSA by PCR NEGATIVE NEGATIVE Final    Comment:        The GeneXpert MRSA Assay (FDA approved for NASAL specimens only), is one component of a comprehensive MRSA colonization surveillance program. It is not intended to diagnose MRSA infection nor to guide or monitor treatment for MRSA infections.   Clostridium Difficile by PCR     Status: None   Collection Time: 10/18/14  9:50 AM  Result Value Ref Range Status   C difficile by pcr NEGATIVE NEGATIVE Final    Comment: Performed at [facility]     Studies: Ct Abdomen Pelvis Wo Contrast  10/16/2014   CLINICAL DATA:  Decubitus ulcer  EXAM: CT ABDOMEN AND PELVIS WITHOUT CONTRAST  TECHNIQUE: Multidetector CT imaging of the abdomen and pelvis was performed following the standard protocol without IV contrast.  COMPARISON:  05/21/2012  FINDINGS: BODY WALL:  There is ulceration of the mid gluteal cleft with granulation tissue or other soft tissue extends to contact the sacrum, appearance similar to previous. There are no sacral changes of osteomyelitis. Fissured appearance of the lower gluteal cleft, extending towards the anus. There is skin thickening and subcutaneous reticulation in the lower left gluteal region which likely also represents pressure changes. No evidence of tract.  Lumpectomy in the lower right breast. Surgical size stable from prior.  LOWER CHEST: There is a moderate sliding-type hiatal hernia. Detection of esophageal or gastric thickening not possible due to under distention and lack of IV contrast.  Bulky mitral annular calcification noted. Chronic subpleural reticulation in the lower lungs.  ABDOMEN/PELVIS:  Liver: Small cysts again noted  in the liver.  Biliary: Cholelithiasis. Gallbladder is full but there is no inflammatory change.  Pancreas: Unremarkable.  Spleen: Unremarkable.  Adrenals: Unremarkable.  Kidneys and ureters: 2 small high-density areas within the lower right kidney likely reflects small hemorrhagic cyst. Small low-density foci in the more cranial right renal cortex is likely also cysts.  Bladder: Decompressed by Foley catheter.  Reproductive: Hysterectomy. Decreased size of a cyst in the right hemipelvis, currently 4.8 cm previously 6.4 cm. As noted previously this could be an ovarian or peritoneal cyst.  Bowel: Sliding hiatal hernia. There is percutaneous gastrostomy tube which is in good position. No pericecal inflammation.  Retroperitoneum: No mass or adenopathy.  Peritoneum: No ascites or pneumoperitoneum.  Vascular: No acute abnormality.  OSSEOUS: Healed insufficiency fractures of the right obturator ring. Diffuse and advanced lumbar degenerative disc disease with levoscoliosis. No acute fracture.  IMPRESSION: 1. Decubitus ulcers, the deepest in the gluteal cleft. No abscess, undermining, or osteomyelitis identified. 2. Chronic findings are stable from 2013 and noted above.   Electronically Signed   By: Jorje Guild M.D.   On: 10/16/2014 22:03   Dg Chest 1 View  09/26/2014   CLINICAL DATA:  Pneumonia.  EXAM: CHEST - 1 VIEW  COMPARISON:  09/14/2014.  02/10/2014.  FINDINGS: Mediastinum and hilar structures normal. Cardiomegaly with normal pulmonary vascularity. No focal pulmonary infiltrate. Chronic interstitial prominence. No pleural effusion or pneumothorax. Degenerative changes thoracic spine. Sliding hiatal hernia.  IMPRESSION: 1. Cardiomegaly.  No overt pulmonary edema. 2. Chronic interstitial lung disease. No focal pulmonary infiltrate.   Electronically Signed   By: Marcello Moores  Register   On: 09/26/2014 11:36   Dg Chest Port 1 View  10/16/2014   CLINICAL DATA:  Shortness of breath and lethargy  EXAM: PORTABLE CHEST -  1  VIEW  COMPARISON:  September 26, 2014  FINDINGS: There is no edema or consolidation. Heart size and pulmonary vascularity are normal. No adenopathy. There is calcification in the mitral annulus. There is degenerative change in the lower thoracic and upper lumbar region with upper lumbar levoscoliosis.  IMPRESSION: No edema or consolidation.   Electronically Signed   By: Lowella Grip M.D.   On: 10/16/2014 08:16    Scheduled Meds: . aspirin  81 mg Per Tube Daily  . ciprofloxacin  500 mg Per Tube BID  . clindamycin  300 mg Per Tube 3 times per day  . collagenase   Topical Daily  . enoxaparin (LOVENOX) injection  50 mg Subcutaneous Q12H  . folic acid  1 mg Per Tube Daily  . free water  200 mL Per Tube Q6H  . furosemide  20 mg Per Tube BID  . insulin aspart  0-20 Units Subcutaneous 5 X Daily  . lidocaine  1 application Topical Daily  . metoprolol tartrate  6.25 mg Per Tube BID  . nystatin  5 mL Oral QID  . PERATIVE  120 mL Oral 5 X Daily  . potassium chloride  20 mEq Per Tube Daily  . predniSONE  20 mg Per Tube Daily  . ranitidine  150 mg Per Tube QHS  . saccharomyces boulardii  250 mg Oral BID   Continuous Infusions: . 0.9 % NaCl with KCl 20 mEq / L 50 mL/hr at 10/21/14 0700    Active Problems:   Sepsis   Severe sepsis   New onset type 2 diabetes mellitus   UTI (lower urinary tract infection)   Sacral wound   Hyperglycemia   Protein-calorie malnutrition, severe   Diabetic hyperosmolar non-ketotic state   Decubitus skin ulcer    Time spent: 40 minutes   [facility] Pager [phone]. If 7PM-7AM, please contact night-coverage at [email or web address], password [facility] 10/21/2014, 10:12 AM  LOS: 5 days

## 2014-10-21 NOTE — Evaluation (Signed)
Physical Therapy Evaluation Patient Details Name: Martha Mcbride MRN: [id number] DOB: [birth year] Today's Date: 10/21/2014   History of Present Illness  81 year old with interstitial lung disease, presumed IPF, polymyalgia on chronic prednisone, chronic diastolic heart failure and severe dysphagia status post PEG presents admitted 1/19 with confusion,hyperglycemia , recent dx of UTI has chronic sacral decubitus.  Clinical Impression  Patient and family well known  From previous admissions, Patient's mobility limited by pain. Per family, patient had been ambulating at home. Patient will benefit from PT to address problems listed in noted.     Follow Up Recommendations Home health PT;Supervision for mobility/OOB (vs. SNF, family has been taking care of pt. 24/7)    Equipment Recommendations  None recommended by PT    Recommendations for Other Services       Precautions / Restrictions Precautions Precautions: Fall Precaution Comments: painful buttocks      Mobility  Bed Mobility Overal bed mobility: Needs Assistance Bed Mobility: Supine to Sit;Sit to Supine;Rolling Rolling: Mod assist   Supine to sit: +2 for physical assistance;+2 for safety/equipment;HOB elevated;Total assist Sit to supine: +2 for safety/equipment;+2 for physical assistance;Total assist   General bed mobility comments: pt in  pain and unable to tolerate   Transfers                    Ambulation/Gait                Stairs            Wheelchair Mobility    Modified Rankin (Stroke Patients Only)       Balance                                             Pertinent Vitals/Pain Pain Assessment: Faces Faces Pain Scale: Hurts worst Pain Location: buttock sore Pain Descriptors / Indicators: Crying;Discomfort;Burning Pain Intervention(s): Monitored during session;Limited activity within patient's tolerance;Repositioned    Home Living Family/patient expects to be  discharged to:: Private residence   Available Help at Discharge: Family;Home health Type of Home: House       Home Layout: One level Home Equipment: Environmental consultant - 2 wheels;Cane - single point;Wheelchair - manual;Bedside commode      Prior Function Level of Independence: Independent with assistive device(s)         Comments: per daughter, ambul;ated with RW PTA, HHPT     Hand Dominance        Extremity/Trunk Assessment   Upper Extremity Assessment: Defer to OT evaluation           Lower Extremity Assessment: LLE deficits/detail;RLE deficits/detail RLE Deficits / Details: pt guarding and does not flex hip or knees, performs ankle pumps LLE Deficits / Details: same as R     Communication   Communication: HOH  Cognition Arousal/Alertness: Awake/alert Behavior During Therapy: Anxious Overall Cognitive Status: History of cognitive impairments - at baseline                      General Comments      Exercises General Exercises - Upper Extremity Shoulder Flexion: AAROM;Both;10 reps;Supine Elbow Flexion: AROM;Both;10 reps;Supine General Exercises - Lower Extremity Ankle Circles/Pumps: AROM;Both;10 reps      Assessment/Plan    PT Assessment Patient needs continued PT services  PT Diagnosis Difficulty walking;Generalized weakness;Acute pain   PT Problem List Decreased strength;Decreased range  of motion;Decreased activity tolerance;Decreased mobility;Decreased balance;Pain  PT Treatment Interventions DME instruction;Gait training;Functional mobility training;Therapeutic activities;Therapeutic exercise;Patient/family education   PT Goals (Current goals can be found in the Care Plan section) Acute Rehab PT Goals Patient Stated Goal: per daughter, to get up. PT Goal Formulation: With family Time For Goal Achievement: 11/04/14 Potential to Achieve Goals: Fair    Frequency Min 3X/week   Barriers to discharge        Co-evaluation               End  of Session   Activity Tolerance: Patient limited by pain Patient left: in bed;with call bell/phone within reach Nurse Communication: Mobility status         Time: 0910-0940 PT Time Calculation (min) (ACUTE ONLY): 30 min   Charges:   PT Evaluation $Initial PT Evaluation Tier I: 1 Procedure PT Treatments $Therapeutic Activity: 23-37 mins   PT G Codes:        Claretha Cooper 10/21/2014, 11:19 AM Tresa Endo PT [phone]

## 2014-10-21 NOTE — Clinical Social Work Psychosocial (Addendum)
Clinical Social Work Department BRIEF PSYCHOSOCIAL ASSESSMENT 10/21/2014  Patient:  Martha Mcbride, Martha Mcbride     Account Number:  [id number]     [place] date:  10/16/2014  Clinical Social Worker:  Dede Query, CLINICAL SOCIAL WORKER  Date/Time:  10/21/2014 12:40 PM  Referred by:  Physician  Date Referred:  10/20/2014 Referred for  SNF Placement   Other Referral:   Interview type:  Family Other interview type:   Daughter Martha Mcbride    PSYCHOSOCIAL DATA Living Status:  ALONE Admitted from facility:   Level of care:   Primary support name:  Martha Mcbride Primary support relationship to patient:  CHILD, ADULT Degree of support available:   High  Martha Mcbride,  daughter is working part time and takes turns with her sister and neice to make sure pt has someone with her 24 hrs    CURRENT CONCERNS  Other Concerns:    SOCIAL WORK ASSESSMENT / PLAN CSW discussed possible SNF placement with daughter and explained the protocol for sending pt information to facilities in case rehab is required.  CSW explored pt's history and care at home with daughter   Assessment/plan status:  Psychosocial Support/Ongoing Assessment of Needs Other assessment/ plan:   Information/referral to community resources:   CSW will fax out information to [facility]    PATIENT'S/FAMILY'S RESPONSE TO PLAN OF CARE: Pt was surrounded by family in her room and stated that it was ok to speak with her daughter. Pt's daughter stated that pt has been living on her own in her own home but that she has someone with her 24/7.  Pt's daughters and grandchild plus advanced care all go into pt's home so pt is never alone. Pt has 27 great grandchildren and pt's daughter is concerned that if pt goes to a SNF she will not have will to live.  Pt's daughter stated that "pt dreads SNF".  Pt's daughter stated she wants to take pt home at discharge but will speak to her sister about the SNF bed and is only agreeable to faxing out information to [place].     Dede Query,  LCSW [facility] Clinical Social Worker - Weekend Coverage cell #: [phone]

## 2014-10-21 NOTE — Clinical Social Work Placement (Addendum)
Clinical Social Work Department CLINICAL SOCIAL WORK PLACEMENT NOTE 10/21/2014  Patient:  KALYN, HOFSTRA  Account Number:  [id number] [place] date:  10/16/2014  Clinical Social Worker:  Dede Query, CLINICAL SOCIAL WORKER  Date/time:  10/21/2014 12:49 PM  Clinical Social Work is seeking post-discharge placement for this patient at the following level of care:   SKILLED NURSING   (*CSW will update this form in Epic as items are completed)   10/21/2014  Patient/family provided with [place] Department of Clinical Social Work's list of facilities offering this level of care within the geographic area requested by the patient (or if unable, by the patient's family).  10/21/2014  Patient/family informed of their freedom to choose among providers that offer the needed level of care, that participate in Medicare, Medicaid or managed care program needed by the patient, have an available bed and are willing to accept the patient.  10/21/2014  Patient/family informed of [facility]' ownership interest in [facility], as well as of the fact that they are under no obligation to receive care at this facility.  PASARR submitted to EDS on existing pasarr number PASARR number received on   FL2 transmitted to all facilities in geographic area requested by pt/family on  10/21/2014 FL2 transmitted to all facilities within larger geographic area on   Patient informed that his/her managed care company has contracts with or will negotiate with  certain facilities, including the following:     Patient/family informed of bed offers received:  10/22/2014 Patient chooses bed at [facility] and [facility] recommends and patient chooses bed at    Patient to be transferred to  on  [facility] on 10/26/2014 Patient to be transferred to facility by ambulance Corey Harold) Patient and family notified of transfer on 10/26/2014 Name of family member notified:  Pt daughter, [name]  notified at bedside  The following physician request were entered in Epic:   Additional Comments:  Dede Query, [facility] Clinical Social Worker - Weekend Coverage cell #: [place], MSW, [place] Work [phone]

## 2014-10-21 NOTE — Progress Notes (Signed)
Patient denies nausea,tolerating the TF,no residuals noted . Will continue to monitor the patient.- Sandie Ano RN

## 2014-10-22 LAB — CULTURE, BLOOD (ROUTINE X 2)
Culture: NO GROWTH
Culture: NO GROWTH

## 2014-10-22 LAB — CBC
HCT: 34 % — ABNORMAL LOW (ref 36.0–46.0)
HEMOGLOBIN: 10.9 g/dL — AB (ref 12.0–15.0)
MCH: 32.2 pg (ref 26.0–34.0)
MCHC: 32.1 g/dL (ref 30.0–36.0)
MCV: 100.3 fL — ABNORMAL HIGH (ref 78.0–100.0)
Platelets: 104 10*3/uL — ABNORMAL LOW (ref 150–400)
RBC: 3.39 MIL/uL — ABNORMAL LOW (ref 3.87–5.11)
RDW: 16.7 % — AB (ref 11.5–15.5)
WBC: 5.1 10*3/uL (ref 4.0–10.5)

## 2014-10-22 LAB — COMPREHENSIVE METABOLIC PANEL
ALK PHOS: 101 U/L (ref 39–117)
ALT: 15 U/L (ref 0–35)
ANION GAP: 5 (ref 5–15)
AST: 21 U/L (ref 0–37)
Albumin: 2.1 g/dL — ABNORMAL LOW (ref 3.5–5.2)
BILIRUBIN TOTAL: 0.4 mg/dL (ref 0.3–1.2)
BUN: 17 mg/dL (ref 6–23)
CALCIUM: 7.3 mg/dL — AB (ref 8.4–10.5)
CO2: 22 mmol/L (ref 19–32)
Chloride: 112 mmol/L (ref 96–112)
Creatinine, Ser: 0.41 mg/dL — ABNORMAL LOW (ref 0.50–1.10)
GFR calc Af Amer: >90 mL/min (ref >90)
GFR calc non Af Amer: >90 mL/min (ref >90)
GLUCOSE: 81 mg/dL (ref 70–99)
Potassium: 3.6 mmol/L (ref 3.5–5.1)
SODIUM: 139 mmol/L (ref 135–145)
Total Protein: 4.6 g/dL — ABNORMAL LOW (ref 6.0–8.3)

## 2014-10-22 LAB — GLUCOSE, CAPILLARY
GLUCOSE-CAPILLARY: 197 mg/dL — AB (ref 70–99)
Glucose-Capillary: 164 mg/dL — ABNORMAL HIGH (ref 70–99)
Glucose-Capillary: 92 mg/dL (ref 70–99)
Glucose-Capillary: 170 mg/dL — ABNORMAL HIGH (ref 70–99)
Glucose-Capillary: 168 mg/dL — ABNORMAL HIGH (ref 70–99)
Glucose-Capillary: 240 mg/dL — ABNORMAL HIGH (ref 70–99)
Glucose-Capillary: 312 mg/dL — ABNORMAL HIGH (ref 70–99)
Glucose-Capillary: 172 mg/dL — ABNORMAL HIGH (ref 70–99)

## 2014-10-22 LAB — PROTIME-INR
INR: 0.98 (ref 0.00–1.49)
Prothrombin Time: 13.1 seconds (ref 11.6–15.2)

## 2014-10-22 MED ORDER — COLLAGENASE 250 UNIT/GM EX OINT: TOPICAL_OINTMENT | Freq: Every day | CUTANEOUS | Status: DC

## 2014-10-22 MED ORDER — CLINDAMYCIN PALMITATE HCL 75 MG/5ML PO SOLR
300.0000 mg | Freq: Three times a day (TID) | ORAL | Status: DC
Start: 2014-10-22 — End: 2014-10-26

## 2014-10-22 MED ORDER — PRO-STAT SUGAR FREE PO LIQD: 30.0000 mL | Freq: Two times a day (BID) | ORAL | Status: DC

## 2014-10-22 MED ORDER — TRAMADOL HCL 50 MG PO TABS
50.0000 mg | ORAL_TABLET | Freq: Four times a day (QID) | ORAL | Status: AC | PRN
  Administered 2014-10-22 – 2014-10-23 (×2): 50 mg via ORAL
  Filled 2014-10-22 (×2): qty 1

## 2014-10-22 MED ORDER — FREE WATER: 200.0000 mL | Freq: Four times a day (QID) | Status: DC

## 2014-10-22 MED ORDER — WARFARIN SODIUM 5 MG PO TABS
5.0000 mg | ORAL_TABLET | Freq: Once | ORAL | Status: AC
  Administered 2014-10-22: 5 mg via ORAL
  Filled 2014-10-22: qty 1

## 2014-10-22 MED ORDER — PREDNISONE 20 MG PO TABS: 20.0000 mg | ORAL_TABLET | Freq: Every day | ORAL | Status: DC

## 2014-10-22 MED ORDER — NYSTATIN 100000 UNIT/ML MT SUSP: 5.0000 mL | Freq: Four times a day (QID) | OROMUCOSAL | Status: DC

## 2014-10-22 MED ORDER — DIPHENOXYLATE-ATROPINE 2.5-0.025 MG/5ML PO LIQD
5.0000 mL | Freq: Once | ORAL | Status: AC
Start: 2014-10-22 — End: 2014-10-22
  Administered 2014-10-22: 5 mL
  Filled 2014-10-22: qty 5

## 2014-10-22 MED ORDER — INSULIN ASPART 100 UNIT/ML ~~LOC~~ SOLN: SUBCUTANEOUS | Status: DC

## 2014-10-22 MED ORDER — ACETAMINOPHEN 160 MG/5ML PO SOLN: 650.0000 mg | Freq: Four times a day (QID) | ORAL | Status: DC | PRN

## 2014-10-22 MED ORDER — PERATIVE PO LIQD
240.0000 mL | Freq: Every day | ORAL | Status: DC
  Administered 2014-10-22: 240 mL via ORAL

## 2014-10-22 MED ORDER — INSULIN ASPART 100 UNIT/ML ~~LOC~~ SOLN
0.0000 [IU] | SUBCUTANEOUS | Status: DC
  Administered 2014-10-22: 4 [IU] via SUBCUTANEOUS

## 2014-10-22 MED ORDER — INSULIN ASPART 100 UNIT/ML ~~LOC~~ SOLN: 0.0000 [IU] | SUBCUTANEOUS | Status: DC

## 2014-10-22 MED ORDER — ENOXAPARIN SODIUM 60 MG/0.6ML ~~LOC~~ SOLN: 50.0000 mg | Freq: Two times a day (BID) | SUBCUTANEOUS | Status: DC

## 2014-10-22 MED ORDER — WARFARIN SODIUM 5 MG PO TABS: 5.0000 mg | ORAL_TABLET | Freq: Once | ORAL | Status: DC

## 2014-10-22 MED ORDER — CIPROFLOXACIN HCL 500 MG PO TABS: 500.0000 mg | ORAL_TABLET | Freq: Two times a day (BID) | ORAL | Status: DC

## 2014-10-22 MED ORDER — ONDANSETRON HCL 4 MG/5ML PO SOLN: 4.0000 mg | Freq: Once | ORAL | Status: DC

## 2014-10-22 MED ORDER — ACETAMINOPHEN 160 MG/5ML PO SOLN
650.0000 mg | Freq: Four times a day (QID) | ORAL | Status: DC | PRN
  Administered 2014-10-22 – 2014-10-25 (×4): 650 mg via ORAL
  Filled 2014-10-22 (×4): qty 20.3

## 2014-10-22 MED ORDER — FUROSEMIDE 8 MG/ML PO SOLN: 20.0000 mg | Freq: Two times a day (BID) | ORAL | Status: DC

## 2014-10-22 MED ORDER — SODIUM CHLORIDE 0.9 % IV SOLN
INTRAVENOUS | Status: DC
  Administered 2014-10-22: via INTRAVENOUS

## 2014-10-22 MED ORDER — PERATIVE PO LIQD: 180.0000 mL | Freq: Every day | ORAL | Status: DC

## 2014-10-22 NOTE — Discharge Instructions (Signed)
Information on my medicine - Coumadin   (Warfarin)  This medication education was reviewed with me or my healthcare representative as part of my discharge preparation.  The pharmacist that spoke with me during my hospital stay was:  Kara Mead, [facility]  Why was Coumadin prescribed for you? Coumadin was prescribed for you because you have a blood clot or a medical condition that can cause an increased risk of forming blood clots. Blood clots can cause serious health problems by blocking the flow of blood to the heart, lung, or brain. Coumadin can prevent harmful blood clots from forming. As a reminder your indication for Coumadin is:   Deep Vein Thrombosis Treatment  What test will check on my response to Coumadin? While on Coumadin (warfarin) you will need to have an INR test regularly to ensure that your dose is keeping you in the desired range. The INR (international normalized ratio) number is calculated from the result of the laboratory test called prothrombin time (PT).  If an INR APPOINTMENT HAS NOT ALREADY BEEN MADE FOR YOU please schedule an appointment to have this lab work done by your health care provider within 7 days. Your INR goal is usually a number between:  2 to 3 or your provider may give you a more narrow range like 2-2.5.  Ask your health care provider during an office visit what your goal INR is.  What  do you need to  know  About  COUMADIN? Take Coumadin (warfarin) exactly as prescribed by your healthcare provider about the same time each day.  DO NOT stop taking without talking to the doctor who prescribed the medication.  Stopping without other blood clot prevention medication to take the place of Coumadin may increase your risk of developing a new clot or stroke.  Get refills before you run out.  What do you do if you miss a dose? If you miss a dose, take it as soon as you remember on the same day then continue your regularly scheduled regimen the next day.  Do not  take two doses of Coumadin at the same time.  Important Safety Information A possible side effect of Coumadin (Warfarin) is an increased risk of bleeding. You should call your healthcare provider right away if you experience any of the following: ? Bleeding from an injury or your nose that does not stop. ? Unusual colored urine (red or dark brown) or unusual colored stools (red or black). ? Unusual bruising for unknown reasons. ? A serious fall or if you hit your head (even if there is no bleeding).  Some foods or medicines interact with Coumadin (warfarin) and might alter your response to warfarin. To help avoid this: ? Eat a balanced diet, maintaining a consistent amount of Vitamin K. ? Notify your provider about major diet changes you plan to make. ? Avoid alcohol or limit your intake to 1 drink for women and 2 drinks for men per day. (1 drink is 5 oz. wine, 12 oz. beer, or 1.5 oz. liquor.)  Make sure that ANY health care provider who prescribes medication for you knows that you are taking Coumadin (warfarin).  Also make sure the healthcare provider who is monitoring your Coumadin knows when you have started a new medication including herbals and non-prescription products.  Coumadin (Warfarin)  Major Drug Interactions  Increased Warfarin Effect Decreased Warfarin Effect  Alcohol (large quantities) Antibiotics (esp. Septra/Bactrim, Flagyl, Cipro) Amiodarone (Cordarone) Aspirin (ASA) Cimetidine (Tagamet) Megestrol (Megace) NSAIDs (ibuprofen, naproxen, etc.)  Piroxicam (Feldene) °Propafenone (Rythmol SR) °Propranolol (Inderal) °Isoniazid (INH) °Posaconazole (Noxafil) Barbiturates (Phenobarbital) °Carbamazepine (Tegretol) °Chlordiazepoxide (Librium) °Cholestyramine (Questran) °Griseofulvin °Oral Contraceptives °Rifampin °Sucralfate (Carafate) °Vitamin K  ° °Coumadin® (Warfarin) Major Herbal Interactions  °Increased Warfarin Effect Decreased Warfarin Effect  °Garlic °Ginseng °Ginkgo biloba  Coenzyme Q10 °Green tea °St. John’s wort   ° °Coumadin® (Warfarin) FOOD Interactions  °Eat a consistent number of servings per week of foods HIGH in Vitamin K °(1 serving = ½ cup)  °Collards (cooked, or boiled & drained) °Kale (cooked, or boiled & drained) °Mustard greens (cooked, or boiled & drained) °Parsley *serving size only = ¼ cup °Spinach (cooked, or boiled & drained) °Swiss chard (cooked, or boiled & drained) °Turnip greens (cooked, or boiled & drained)  °Eat a consistent number of servings per week of foods MEDIUM-HIGH in Vitamin K °(1 serving = 1 cup)  °Asparagus (cooked, or boiled & drained) °Broccoli (cooked, boiled & drained, or raw & chopped) °Brussel sprouts (cooked, or boiled & drained) *serving size only = ½ cup °Lettuce, raw (green leaf, endive, romaine) °Spinach, raw °Turnip greens, raw & chopped  ° °These websites have more information on Coumadin (warfarin):  www.coumadin.com; °www.ahrq.gov/consumer/coumadin.htm; ° ° °

## 2014-10-22 NOTE — Progress Notes (Signed)
CSW continuing to follow.   CSW followed up with pt daughters regarding disposition plannning.   Pt daughters are in agreement to SNF. Pt daughter first preference is [facility] which is unable to offer a bed until norovirus results available. CSW expanded SNF search and CSW provided pt family with SNF bed offers.   CSW contacted lab regarding norovirus test could take 1-3 days to return, but MD anticipating discharge tomorrow.   CSW notified pt daughters that discharge cannot be delayed awaiting lab result for [facility] and that they will need to choose alternative placement if they wish for pt to go to SNF.   CSW to continue to follow.  Alison Murray, MSW, [place] [facility] [phone]

## 2014-10-22 NOTE — Discharge Summary (Addendum)
Physician Discharge Summary  NAFISA OLDS MRN: [id number] DOB/AGE: 04/05/1925 81 y.o.  PCP: Kandice Hams, MD   Admit date: 10/16/2014 Discharge date: 10/22/2014  Discharge Diagnoses:      Sepsis   Severe sepsis   New onset type 2 diabetes mellitus   UTI (lower urinary tract infection)   Sacral wound   Hyperglycemia   Protein-calorie malnutrition, severe   Diabetic hyperosmolar non-ketotic state   Decubitus skin ulcer  Patient clinically has no signs or symptoms of norovirus, namely gastroenteritis , fever, diarrhea nausea vomiting Do not recommend delaying discharge based on her lab tests which has no clinical relevance to her current presentation Discharge delayed because of delayed lab work which has no clinical relevance today    Nutrition recommendations  -Increased Perative via PEG, 120 ml (1/2 can) five times daily (6 am, 10 am, 2 pm, 6 pm, 10 pm) to home regimen of Perative formula, bolus feeds of 8 oz at 6am, then 7 oz at 10am, 2 pm, 6 pm, and 10 pm. -30 ml Prostat daily This provides 1488 kcal, 87g protein and 843 ml of H2O.  -Monitor Blood Glucose -RD to follow-up daily   advance to [place] / 5 times daily.  Receiving ProMod BID at home, therefore continue Prostat to BID to mirror routine at home.  Follow-up recommendations  follow-up with PCP in 3-5 days Follow-up with rheumatology in 3-5 days CBC, BMP weekly    Medication List    STOP taking these medications        furosemide 10 MG/ML solution  Commonly known as:  LASIX  Replaced by:  furosemide 8 MG/ML solution     loperamide 1 MG/5ML solution  Commonly known as:  IMODIUM     predniSONE 5 MG/5ML solution  Replaced by:  predniSONE 20 MG tablet      TAKE these medications        acetaminophen 160 MG/5ML solution  Commonly known as:  TYLENOL  Take 20.3 mLs (650 mg total) by mouth every 6 (six) hours as needed for mild pain.     aspirin 81 MG chewable tablet  Place 1 tablet (81 mg  total) into feeding tube daily.     ciprofloxacin 500 MG tablet  Commonly known as:  CIPRO  Place 1 tablet (500 mg total) into feeding tube 2 (two) times daily.     clindamycin 75 MG/5ML solution  Commonly known as:  CLEOCIN  Place 20 mLs (300 mg total) into feeding tube every 8 (eight) hours.     collagenase ointment  Commonly known as:  SANTYL  Apply topically daily.     enoxaparin 60 MG/0.6ML injection  Commonly known as:  LOVENOX  Inject 0.5 mLs (50 mg total) into the skin every 12 (twelve) hours.     feeding supplement (PRO-STAT SUGAR FREE 64) Liqd  Place 30 mLs into feeding tube 2 (two) times daily with a meal.     folic acid 1 MG tablet  Commonly known as:  FOLVITE  Place 1 tablet (1 mg total) into feeding tube daily.     free water Soln  Place 120 mLs into feeding tube 4 (four) times daily.     free water Soln  Place 200 mLs into feeding tube every 6 (six) hours.     furosemide 8 MG/ML solution  Commonly known as:  LASIX  Place 2.5 mLs (20 mg total) into feeding tube 2 (two) times daily.     hydroxypropyl methylcellulose /  hypromellose 2.5 % ophthalmic solution  Commonly known as:  ISOPTO TEARS / GONIOVISC  Place 1 drop into both eyes daily as needed for dry eyes.     lidocaine 2 % jelly  Commonly known as:  XYLOCAINE  Apply 1 application topically as needed (for sores).     metoprolol tartrate 25 mg/10 mL Susp  Commonly known as:  LOPRESSOR  Place 2.5 mLs (6.25 mg total) into feeding tube 2 (two) times daily.     nystatin 100000 UNIT/ML suspension  Commonly known as:  MYCOSTATIN  Take 5 mLs (500,000 Units total) by mouth 4 (four) times daily.     ondansetron 4 MG/5ML solution  Commonly known as:  ZOFRAN  Take 5 mLs (4 mg total) by mouth once.     PERATIVE Liqd  8oz at [employer], 7oz at 10am, 2pm, 6pm and 10pm via G tube     PERATIVE Liqd  Take 180 mLs by mouth 5 (five) times daily.     potassium chloride 20 MEQ/15ML (10%) Soln  Place 15 mLs (20 mEq  total) into feeding tube daily.     predniSONE 20 MG tablet  Commonly known as:  DELTASONE  Place 1 tablet (20 mg total) into feeding tube daily.     ranitidine 75 MG/5ML syrup  Commonly known as:  ZANTAC  Place 10 mLs (150 mg total) into feeding tube at bedtime.     saccharomyces boulardii 250 MG capsule  Commonly known as:  FLORASTOR  Take 1 capsule (250 mg total) by mouth 2 (two) times daily.     warfarin 5 MG tablet  Commonly known as:  COUMADIN  Take 1 tablet (5 mg total) by mouth once.        Discharge Condition:    Disposition: 01-Home or Self Care   Consults:  Critical care Wound care     Significant Diagnostic Studies: Ct Abdomen Pelvis Wo Contrast  10/16/2014   CLINICAL DATA:  Decubitus ulcer  EXAM: CT ABDOMEN AND PELVIS WITHOUT CONTRAST  TECHNIQUE: Multidetector CT imaging of the abdomen and pelvis was performed following the standard protocol without IV contrast.  COMPARISON:  05/21/2012  FINDINGS: BODY WALL:  There is ulceration of the mid gluteal cleft with granulation tissue or other soft tissue extends to contact the sacrum, appearance similar to previous. There are no sacral changes of osteomyelitis. Fissured appearance of the lower gluteal cleft, extending towards the anus. There is skin thickening and subcutaneous reticulation in the lower left gluteal region which likely also represents pressure changes. No evidence of tract.  Lumpectomy in the lower right breast. Surgical size stable from prior.  LOWER CHEST: There is a moderate sliding-type hiatal hernia. Detection of esophageal or gastric thickening not possible due to under distention and lack of IV contrast.  Bulky mitral annular calcification noted. Chronic subpleural reticulation in the lower lungs.  ABDOMEN/PELVIS:  Liver: Small cysts again noted in the liver.  Biliary: Cholelithiasis. Gallbladder is full but there is no inflammatory change.  Pancreas: Unremarkable.  Spleen: Unremarkable.  Adrenals:  Unremarkable.  Kidneys and ureters: 2 small high-density areas within the lower right kidney likely reflects small hemorrhagic cyst. Small low-density foci in the more cranial right renal cortex is likely also cysts.  Bladder: Decompressed by Foley catheter.  Reproductive: Hysterectomy. Decreased size of a cyst in the right hemipelvis, currently 4.8 cm previously 6.4 cm. As noted previously this could be an ovarian or peritoneal cyst.  Bowel: Sliding hiatal hernia. There is percutaneous gastrostomy  tube which is in good position. No pericecal inflammation.  Retroperitoneum: No mass or adenopathy.  Peritoneum: No ascites or pneumoperitoneum.  Vascular: No acute abnormality.  OSSEOUS: Healed insufficiency fractures of the right obturator ring. Diffuse and advanced lumbar degenerative disc disease with levoscoliosis. No acute fracture.  IMPRESSION: 1. Decubitus ulcers, the deepest in the gluteal cleft. No abscess, undermining, or osteomyelitis identified. 2. Chronic findings are stable from 2013 and noted above.   Electronically Signed   By: Jorje Guild M.D.   On: 10/16/2014 22:03   Dg Chest 1 View  09/26/2014   CLINICAL DATA:  Pneumonia.  EXAM: CHEST - 1 VIEW  COMPARISON:  09/14/2014.  02/10/2014.  FINDINGS: Mediastinum and hilar structures normal. Cardiomegaly with normal pulmonary vascularity. No focal pulmonary infiltrate. Chronic interstitial prominence. No pleural effusion or pneumothorax. Degenerative changes thoracic spine. Sliding hiatal hernia.  IMPRESSION: 1. Cardiomegaly.  No overt pulmonary edema. 2. Chronic interstitial lung disease. No focal pulmonary infiltrate.   Electronically Signed   By: Marcello Moores  Register   On: 09/26/2014 11:36   Dg Chest Port 1 View  10/16/2014   CLINICAL DATA:  Shortness of breath and lethargy  EXAM: PORTABLE CHEST - 1 VIEW  COMPARISON:  September 26, 2014  FINDINGS: There is no edema or consolidation. Heart size and pulmonary vascularity are normal. No adenopathy. There  is calcification in the mitral annulus. There is degenerative change in the lower thoracic and upper lumbar region with upper lumbar levoscoliosis.  IMPRESSION: No edema or consolidation.   Electronically Signed   By: Lowella Grip M.D.   On: 10/16/2014 08:16      Microbiology: Recent Results (from the past 240 hour(s))  Blood Culture (routine x 2)     Status: None   Collection Time: 10/16/14  7:39 AM  Result Value Ref Range Status   Specimen Description BLOOD RIGHT HAND  Final   Special Requests BOTTLES DRAWN AEROBIC AND ANAEROBIC 3ML  Final   Culture   Final    NO GROWTH 5 DAYS Performed at [facility]    Report Status 10/22/2014 FINAL  Final  Blood Culture (routine x 2)     Status: None   Collection Time: 10/16/14  7:55 AM  Result Value Ref Range Status   Specimen Description BLOOD RIGHT ANTECUBITAL  Final   Special Requests BOTTLES DRAWN AEROBIC AND ANAEROBIC 5ML  Final   Culture   Final    NO GROWTH 5 DAYS Performed at [facility]    Report Status 10/22/2014 FINAL  Final  Wound culture     Status: None   Collection Time: 10/16/14  8:25 AM  Result Value Ref Range Status   Specimen Description WOUND VAGINA  Final   Special Requests NONE  Final   Gram Stain   Final    FEW WBC PRESENT,BOTH PMN AND MONONUCLEAR NO SQUAMOUS EPITHELIAL CELLS SEEN RARE GRAM POSITIVE COCCI IN PAIRS IN CLUSTERS RARE GRAM NEGATIVE RODS Performed at [facility]    Culture   Final    FEW YEAST CONSISTENT WITH CANDIDA SPECIES Performed at [facility]    Report Status 10/18/2014 FINAL  Final  Urine culture     Status: None   Collection Time: 10/16/14  8:27 AM  Result Value Ref Range Status   Specimen Description URINE, CATHETERIZED  Final   Special Requests NONE  Final   Colony Count NO GROWTH Performed at [facility]   Final   Culture NO GROWTH  Performed at [facility]   Final   Report Status 10/17/2014 FINAL  Final  MRSA PCR  Screening     Status: None   Collection Time: 10/16/14  1:30 PM  Result Value Ref Range Status   MRSA by PCR NEGATIVE NEGATIVE Final    Comment:        The GeneXpert MRSA Assay (FDA approved for NASAL specimens only), is one component of a comprehensive MRSA colonization surveillance program. It is not intended to diagnose MRSA infection nor to guide or monitor treatment for MRSA infections.   Clostridium Difficile by PCR     Status: None   Collection Time: 10/18/14  9:50 AM  Result Value Ref Range Status   C difficile by pcr NEGATIVE NEGATIVE Final    Comment: Performed at [place]: Results for orders placed or performed during the hospital encounter of 10/16/14 (from the past 48 hour(s))  Glucose, capillary     Status: Abnormal   Collection Time: 10/20/14 11:59 AM  Result Value Ref Range   Glucose-Capillary 214 (H) 70 - 99 mg/dL  Glucose, capillary     Status: Abnormal   Collection Time: 10/20/14  3:55 PM  Result Value Ref Range   Glucose-Capillary 283 (H) 70 - 99 mg/dL   Comment 1 Documented in Chart    Comment 2 Notify RN   Glucose, capillary     Status: Abnormal   Collection Time: 10/20/14  7:56 PM  Result Value Ref Range   Glucose-Capillary 193 (H) 70 - 99 mg/dL   Comment 1 Notify RN   Glucose, capillary     Status: Abnormal   Collection Time: 10/20/14 11:53 PM  Result Value Ref Range   Glucose-Capillary 138 (H) 70 - 99 mg/dL   Comment 1 Notify RN   CBC     Status: Abnormal   Collection Time: 10/21/14  4:26 AM  Result Value Ref Range   WBC 6.6 4.0 - 10.5 K/uL   RBC 3.81 (L) 3.87 - 5.11 MIL/uL   Hemoglobin 12.0 12.0 - 15.0 g/dL   HCT 37.9 36.0 - 46.0 %   MCV 99.5 78.0 - 100.0 fL   MCH 31.5 26.0 - 34.0 pg   MCHC 31.7 30.0 - 36.0 g/dL   RDW 16.6 (H) 11.5 - 15.5 %   Platelets 99 (L) 150 - 400 K/uL    Comment: DELTA CHECK NOTED SPECIMEN CHECKED FOR CLOTS PLATELET COUNT CONFIRMED BY SMEAR   Glucose, capillary     Status: None    Collection Time: 10/21/14  4:42 AM  Result Value Ref Range   Glucose-Capillary 73 70 - 99 mg/dL   Comment 1 Notify RN   Vitamin B12     Status: None   Collection Time: 10/21/14  7:45 AM  Result Value Ref Range   Vitamin B-12 743 211 - 911 pg/mL    Comment: Performed at [facility]  Glucose, capillary     Status: Abnormal   Collection Time: 10/21/14  8:05 AM  Result Value Ref Range   Glucose-Capillary 151 (H) 70 - 99 mg/dL   Comment 1 Documented in Chart   Protime-INR     Status: None   Collection Time: 10/21/14 12:21 PM  Result Value Ref Range   Prothrombin Time 13.7 11.6 - 15.2 seconds   INR 1.04 0.00 - 1.49  Glucose, capillary     Status: Abnormal   Collection Time: 10/21/14  3:56 PM  Result Value  Ref Range   Glucose-Capillary 294 (H) 70 - 99 mg/dL  Glucose, capillary     Status: Abnormal   Collection Time: 10/21/14  8:20 PM  Result Value Ref Range   Glucose-Capillary 203 (H) 70 - 99 mg/dL   Comment 1 Notify RN   Glucose, capillary     Status: Abnormal   Collection Time: 10/22/14 12:02 AM  Result Value Ref Range   Glucose-Capillary 164 (H) 70 - 99 mg/dL   Comment 1 Notify RN   CBC     Status: Abnormal   Collection Time: 10/22/14  4:37 AM  Result Value Ref Range   WBC 5.1 4.0 - 10.5 K/uL   RBC 3.39 (L) 3.87 - 5.11 MIL/uL   Hemoglobin 10.9 (L) 12.0 - 15.0 g/dL   HCT 34.0 (L) 36.0 - 46.0 %   MCV 100.3 (H) 78.0 - 100.0 fL   MCH 32.2 26.0 - 34.0 pg   MCHC 32.1 30.0 - 36.0 g/dL   RDW 16.7 (H) 11.5 - 15.5 %   Platelets 104 (L) 150 - 400 K/uL    Comment: CONSISTENT WITH PREVIOUS RESULT  Comprehensive metabolic panel     Status: Abnormal   Collection Time: 10/22/14  4:37 AM  Result Value Ref Range   Sodium 139 135 - 145 mmol/L   Potassium 3.6 3.5 - 5.1 mmol/L   Chloride 112 96 - 112 mmol/L   CO2 22 19 - 32 mmol/L   Glucose, Bld 81 70 - 99 mg/dL   BUN 17 6 - 23 mg/dL   Creatinine, Ser 0.41 (L) 0.50 - 1.10 mg/dL   Calcium 7.3 (L) 8.4 - 10.5 mg/dL   Total  Protein 4.6 (L) 6.0 - 8.3 g/dL   Albumin 2.1 (L) 3.5 - 5.2 g/dL   AST 21 0 - 37 U/L   ALT 15 0 - 35 U/L   Alkaline Phosphatase 101 39 - 117 U/L   Total Bilirubin 0.4 0.3 - 1.2 mg/dL   GFR calc non Af Amer >90 >90 mL/min   GFR calc Af Amer >90 >90 mL/min    Comment: (NOTE) The eGFR has been calculated using the CKD EPI equation. This calculation has not been validated in all clinical situations. eGFR's persistently <90 mL/min signify possible Chronic Kidney Disease.    Anion gap 5 5 - 15  Protime-INR     Status: None   Collection Time: 10/22/14  4:37 AM  Result Value Ref Range   Prothrombin Time 13.1 11.6 - 15.2 seconds   INR 0.98 0.00 - 1.49  Glucose, capillary     Status: None   Collection Time: 10/22/14  4:45 AM  Result Value Ref Range   Glucose-Capillary 92 70 - 99 mg/dL  Glucose, capillary     Status: Abnormal   Collection Time: 10/22/14  7:50 AM  Result Value Ref Range   Glucose-Capillary 197 (H) 70 - 99 mg/dL   Comment 1 Documented in Chart    Comment 2 Notify RN   Glucose, capillary     Status: Abnormal   Collection Time: 10/22/14 10:19 AM  Result Value Ref Range   Glucose-Capillary 170 (H) 70 - 99 mg/dL   Comment 1 Documented in Chart    Comment 2 Notify RN      Brief narrative: 81 y/o F with a past medical history of breast cancer (remote), dementia, chronic diastolic CHF (baseline Lasix 20 mg BID), aortic stenosis and severely calcified mitral valve with moderate stenosis, gangrenous toes on the right  foot (followed by Dr. Darrick Penna), polymyalgia rheumatica on prednisone (20 mg QD), interstitial lung disease / presumed IPF, large hiatal hernia, severe dysphasia status post PEG tube placement, multiple admissions for aspiration pneumonia, chronic diarrhea, who is chair/bed bound at baseline (followed at the wound clinic at [facility]). She is cared for at home by her daughters Alvino Chapel and Dondra Spry.   Chart review notes that they call the cardiology office on 1/17 with  concerns for tachycardia and lethargy. The daughters medicated the patient with an additional dose of Lopressor. They returned to the cardiology office for an acute visit on 1/18 with a three-day history of progressive weakness and fatigue. Notes reflect that a urinalysis was obtained and the family was encouraged to send the patient to the hospital for evaluation. She was treated with Cipro for UTI. However, they obtained a urinalysis and then proceeded to leave the office. They called the office to let them know that they were taking her home. Attempts were made to contact her via phone & messages were left at that time.   On 1/19 the patient presented to the [facility] emergency room via EMS with progressive confusion, lethargy and tachypnea. Initial BP 118/65, rate 133, rectal temperature of 101.2, RR 31 and SPO2 94%. Initial lab work notable for hgb 15.5, Na 151, CL 1:15, BUN 54, sr cr on 0.17, albumin 2.9, glucose 735 and lactic acid of 6.69. Repeat urinalysis on 1/19 with glucose greater than 1000, negative nitrite, negative leukocytes, 3-6 WBC, and few bacteria. CXR was negative for acute infiltrate. She was started on an insulin drip in the emergency room and treated with IV abx for potential wound infection/UTI. Of note, her caretakers have apparently had something similar to a viral gastroenteritis. PCCM consulted for ICU admission.  Consultants:  Critical care  Procedures:  None   Antiinfectives  1/19 >>aztreonam >> 1/22 1/19 >>vancomycin >> 1/22 1/19 >>metronidazole >> 1/22 1/19 >>levofloxacin >> 1/19 1/22 >> ciprofloxacin >> 2/1  1/22 > clindamycin >> 2/1  HOSPITAL COURSE:   Sacral decubitus ulcers/UTI BCx2 1/19 >> no growth so far UC 1/19 >> neg C-Diff 1/19 >> negative Norovirus 1/19 >> pending possible hyperbaric therapy at the wound care center once the patient is discharged. Patient also needs to discuss slowly tapering her steroid regimen with her  rheumatologist as this is interfering with wound healing Continue ciprofloxacin for UTI. Clindamycin added for cellulitis , until 2/1 Continue florastor  vancomycin aztreonam-discontinued 1/22 CT negative for abscess.  Consider hyperbaric Rx as an outpatient , patient has seen a Engineer, [facility] in [place] and should also follow-up with them   Bilateral DVT,Right: DVT noted in the FV and popliteal vein. Left: DVT noted in the CFV, FV, popliteal vein Lovenox therapeutic dose started 1/23 and Coumadin started 1/24, Require 5 day overlap of Lovenox/Warfarin to include 48 hr of the overlap with therapeutic INR Recommend daily INR monitoring for the first week Then weekly 4 weeks and then monthly   interstitial lung disease/aspiration risk Chest x-ray negative upon admission Continue Aspiration precautions, pulmonary hygiene continue tube feedings Continue taking aspiration precautions and checking post void residue Siri Cole Started on nystatin swish and spit    Sepsis, secondary to sacral decubitus ulcer/UTI Now improved, discontinued broad-spectrum antibiotics and switched to ciprofloxacin, clindamycin , start date 1/29 Stress dose steroids until 1/20 Continue maintenance dose steroids , and slowly taper this as advised by rheumatology   Hypertension,AS - moderate on TEE 07/20/14 & calcified MV Continue  Lopressor, continue low-dose Lasix   Hypernatremia, hyperkalemia , hypokalemia, acute kidney injury All improving Continue free water through feeding tube Continue Foley catheter given perineal ulcers BMP weekly   Hyperglycemia,DM 2   discontinued Lantus , glipizide as this is invariably caused 4 AM hypoglycemia Continue SSI with tube feeding boluses, and follow sliding scale     Severe Dysphagia - s/p PEG, NPO at baseline (since late fall 2015). In setting of large hiatal hernia. They have been doing evaluations with SLP for swallowing at home despite NPO  status / rec's with concern for aspiration.  Continue home PEG tube feeding, nutrition reconsulted toadvance feeding Hold Imodium, C. difficile negative Continue florastor    Thrombocytopenia likely secondary to sepsis Follow CBC weekly Thrombocytopenia is improving    Code Status: full   Discharge Exam:    Blood pressure 106/50, pulse 85, temperature 97.9 F (36.6 C), temperature source Oral, resp. rate 20, height $RemoveBe'5\' 2"'eesGwyAjJ$  (1.575 m), weight 53.4 kg (117 lb 11.6 oz), SpO2 98 %.  General: Frail elderly female, awake, answering questions appropriately Neuro: Awake/alert, oriented to self, place & events, MAE, generalized weakness  HEENT: Mm pink/dry, no jvd, dentures in place  Cardiovascular: s1s2 rrr, 3/6 SEM Lungs: Even/non-labored, lungs bilaterally with fine basilar crackles Abdomen: PEG c/d/i, NTND Musculoskeletal: Left lower extremity shows decreased swelling Skin: Warm/dry, good cap refills, 1+ pitting edema in BLE, sacral/rectal wound erythematous, ~ stage 3 with rectal excoriation with yellow/brown crusting & drainage, deep fissure like open wound approx 3 inches in length in intergluteal cleft. R foot 2nd/3rd toes with chronic dry gangrene (unchanged from prior admits)         Follow-up Information    Follow up with Kandice Hams, MD. Schedule an appointment as soon as possible for a visit in 3 days.   Specialty:  Internal Medicine   Contact information:   [address] [place] [address] [phone]       Follow up with Hennie Duos, MD. Schedule an appointment as soon as possible for a visit in 3 days.   Specialty:  Rheumatology   Contact information:   [place], [place]  [place] [address] [phone]       Signed: Reyne Dumas 10/22/2014, 11:27 AM

## 2014-10-22 NOTE — Progress Notes (Signed)
NUTRITION FOLLOW-UP  INTERVENTION: -Increased Perative via PEG, 120 ml (1/2 can) five times daily (6 am, 10 am, 2 pm, 6 pm, 10 pm) to home regimen of  Perative formula, bolus feeds of 8 oz at 6am, then 7 oz at 10am, 2 pm, 6 pm, and 10 pm. -30 ml Prostat BID This provides 1740 kcal, 109g protein and  935 ml of H2O.  -Monitor Blood Glucose -RD to follow-up daily   NUTRITION DIAGNOSIS: Inadequate oral intake related to inability to eat as evidenced by NPO status, ongoing.  Goal: Pt to meet >/= 90% of their estimated nutrition needs, unmet   Monitor:  TF regimen & tolerance, weight, labs, I/O's  ASSESSMENT: 81 y.o female who on 1/19  presented to the [facility] emergency room via EMS with progressive confusion, lethargy and tachypnea.  1/20: -Pt familiar to this RD from previous admissions (07/30/14-08/10/14, 08/25/14-09/05/14). -Pt was admitted with CBGs in the 700s, HgbA1c of 8.2 and pt has been experiencing some diarrhea which stopped when TF was held. -Per granddaughter, pt on home TF regimen, Perative formula, bolus feeds of 8 oz at 6am, then 7 oz at 10am, 2 pm, 6 pm, and 10 pm. This provides. 1388 kcal, 72g protein and 843 ml of H2O. Family would prefer to continue with the same formula as they saw less diarrhea and side effects with this formula vs. Jevity 1.2. -Spoke with pharmacy about ordering this product, per policy it should be provided within 72 hours. Family has opted to provide supply of Perative formula from home. RD to order TF initiation and TF to start when family supplies formula.  1/21 -Pt is tolerating 1/2 doses of Perative TF 5 times daily. -Will monitor tolerance a little longer and advance to home regimen. -Pt with some diarrhea since TF started, c diff pending. -Spoke with granddaughter at bedside, reports no issues currently. Has no questions at this time.  1/22: - Pt tolerating 1/2 doses of TF at this time. Per RN, pt without diarrhea, but stools are soft.  Pt's family member had questions about pt's blood sugar being high. Concerned whether TF formula should be changed. RD recommended to stay on the current formula through the weekend and monitor blood glucose. Pt with history of severe diarrhea with formula change.   1/24 -Pt tolerating TF at this time. Pt's TF will be advanced to 5 cans of Perative daily. -Spoke with pt's daughter [name]. Per daughter, pt was receiving ProMod supplement in addition to [employer]. RD to order 30 ml Prostat daily to better meet protein requirements. -Daughter was upset during visit, had questions as to why pharmacy did not provide TF formula, why TF was advanced so slowly, and why pt's albumin is low. RD answered all questions. Explained to pt's family that recommendations were made in RD note 1/22 but order was not changed. Apologized and assured daughter that we would be following daily and would advance TF today.  -When asked if the family wanted pharmacy to order additional cans of Perative formula, family declined.  1/25 -Spoke with pt's family. RD to advance Perative to 8 oz 5x/day to meet 100% of estimated needs. RN notified of change and to advance starting with 1800 feeding. -Pt is to be discharged tomorrow per SW note -CBGs:92-197 mg/dL  Labs: Mg/Phos/K WNL  Height: Ht Readings from Last 1 Encounters:  10/16/14 5\' 2"  (1.575 m)    Weight: Wt Readings from Last 1 Encounters:  10/22/14 117 lb 11.6 oz (53.4 kg)  BMI:  Body mass index is 21.53 kg/(m^2).  Estimated Nutritional Needs: Kcal: 1600-1800 Protein: 85-95g Fluid: 1.6L/day  Skin: gangrenous toes, stage 3 sacral pressure ulcer, +2 RLE, +2 LLE    Diet Order: Diet NPO time specified Diet - low sodium heart healthy  EDUCATION NEEDS: -Education needs addressed   Intake/Output Summary (Last 24 hours) at 10/22/14 1654 Last data filed at 10/22/14 1458  Gross per 24 hour  Intake      0 ml  Output   1750 ml  Net  -1750 ml    Last BM:  1/24  Labs:   Recent Labs Lab 10/17/14 0409  10/19/14 1100  10/20/14 0425 10/20/14 0653 10/22/14 0437  NA 156*  < >  --   < > 142 142 139  K 3.9  < >  --   < > 5.8* 3.8 3.6  CL 124*  < >  --   < > 117* 115* 112  CO2 23  < >  --   < > 20 21 22   BUN 34*  < >  --   < > 14 15 17   CREATININE 0.62  < >  --   < > 0.52 0.54 0.41*  CALCIUM 7.7*  < >  --   < > 7.0* 7.1* 7.3*  MG 2.3  --  2.0  --   --   --   --   PHOS 3.0  --   --   --   --   --   --   GLUCOSE 165*  < >  --   < > 76 179* 81  < > = values in this interval not displayed.  CBG (last 3)   Recent Labs  10/22/14 0445 10/22/14 0750 10/22/14 1019  GLUCAP 92 197* 170*    Scheduled Meds: . ciprofloxacin  500 mg Per Tube BID  . clindamycin  300 mg Per Tube 3 times per day  . collagenase   Topical Daily  . enoxaparin (LOVENOX) injection  50 mg Subcutaneous Q12H  . feeding supplement (PRO-STAT SUGAR FREE 64)  30 mL Per Tube BID WC  . folic acid  1 mg Per Tube Daily  . free water  200 mL Per Tube Q6H  . furosemide  20 mg Per Tube BID  . insulin aspart  0-20 Units Subcutaneous 5 X Daily  . lidocaine  1 application Topical Daily  . metoprolol tartrate  6.25 mg Per Tube BID  . nystatin  5 mL Oral QID  . ondansetron  4 mg Oral Once  . PERATIVE  240 mL Oral 5 X Daily  . potassium chloride  20 mEq Per Tube Daily  . predniSONE  20 mg Per Tube Daily  . ranitidine  150 mg Per Tube QHS  . saccharomyces boulardii  250 mg Oral BID  . Warfarin - Pharmacist Dosing Inpatient   Does not apply q1800    Continuous Infusions:    Clayton Bibles, MS, RD, LDN Pager: [phone] After Hours Pager: [phone]

## 2014-10-22 NOTE — Progress Notes (Signed)
OT Cancellation Note  Patient Details Name: Martha Mcbride MRN: [id number] DOB: 1925/05/06   Cancelled Treatment:    Reason Eval/Treat Not Completed: Other (comment) Pt with nursing.  Pt having dressings changed. Will recheck on pt later in day or next day. Noted plan for SNF .   Betsy Pries 10/22/2014, 12:21 PM

## 2014-10-22 NOTE — Progress Notes (Signed)
ANTICOAGULATION CONSULT NOTE - Follow-up Consult  Pharmacy Consult for Lovenox / Warfarin  Indication: DVT  Allergies  Allergen Reactions  . Doxycycline Nausea And Vomiting  . Other Nausea And Vomiting and Other (See Comments)    Anesthesia makes her very sleepy and makes it hard for her to come out of it.   Marland Kitchen Penicillins Hives and Nausea And Vomiting  . Sulfa Antibiotics Nausea And Vomiting   Patient Measurements: Height: 5\' 2"  (157.5 cm) Weight: 117 lb 11.6 oz (53.4 kg) IBW/kg (Calculated) : 50.1  Vital Signs: Temp: 97.9 F (36.6 C) (01/25 0443) Temp Source: Oral (01/25 0443) BP: 106/50 mmHg (01/25 0443) Pulse Rate: 85 (01/25 0443)  Labs:  Recent Labs  10/19/14 1105 10/20/14 0425 10/20/14 9937 10/21/14 0426 10/21/14 1221 10/22/14 0437  HGB 12.0  --   --  12.0  --  10.9*  HCT 38.7  --   --  37.9  --  34.0*  PLT 72*  --   --  99*  --  104*  LABPROT  --   --   --   --  13.7 13.1  INR  --   --   --   --  1.04 0.98  CREATININE 0.61 0.52 0.54  --   --  0.41*   Estimated Creatinine Clearance: 39.2 mL/min (by C-G formula based on Cr of 0.41).  Medical History: Past Medical History  Diagnosis Date  . Arthritis   . Polymyalgia   . Acid reflux   . Immune deficiency disorder     autoimmune disorder - polymyalgia - on prednisone  . Breast cancer     Treated with lumpectomy and radiation  . Giardia   . Pinworms   . History of hiatal hernia   . Pneumonia   . Dementia   . CHF (congestive heart failure)   . Dysrhythmia    Medications:  Scheduled:  . ciprofloxacin  500 mg Per Tube BID  . clindamycin  300 mg Per Tube 3 times per day  . collagenase   Topical Daily  . enoxaparin (LOVENOX) injection  50 mg Subcutaneous Q12H  . feeding supplement (PRO-STAT SUGAR FREE 64)  30 mL Per Tube BID WC  . folic acid  1 mg Per Tube Daily  . free water  200 mL Per Tube Q6H  . furosemide  20 mg Per Tube BID  . insulin aspart  0-20 Units Subcutaneous 5 X Daily  . lidocaine  1  application Topical Daily  . metoprolol tartrate  6.25 mg Per Tube BID  . nystatin  5 mL Oral QID  . ondansetron  4 mg Oral Once  . PERATIVE  180 mL Oral 5 X Daily  . potassium chloride  20 mEq Per Tube Daily  . predniSONE  20 mg Per Tube Daily  . ranitidine  150 mg Per Tube QHS  . saccharomyces boulardii  250 mg Oral BID  . Warfarin - Pharmacist Dosing Inpatient   Does not apply q1800    Assessment: 89yoF admitted to 1/19 for sepsis from wound infection vs UTI.  Now found to have new bilateral DVT.  Noted with thrombocytopenia, possibly from sepsis vs ranitidine.  Suspected HIT during last admission, but HIT panel returned negative.  PMH: CHF, ILD, hiatal hernia s/p PEG tube placement, dementia.  Changed from prophylactic lovenox to treatment dose Lovenox on 1/23 pm, Pharmacy consulted to start warfarin on 1/24.   Require 5 day overlap of Lovenox/Warfarin to include 48 hr of  the overlap with therapeutic INR.  1/24: Day 2/5 overlap Baseline INR 1.04, INR 0.98 today after 2.5mg  Warfarin CBC: Thrombocytopenia 2/2 sepsis, plts continue to improve No bleeding issues noted Moderate renal impairment; low SCr (chair/bed bound at baseline) Note also on ASA 81, and Ciprofloxacin (which may cause elevations in INR)  Goal of Therapy:  Anti-Xa level 0.6-1 units/ml 4hrs after LMWH dose given Monitor platelets by anticoagulation protocol: Yes  INR 2 - 3   Plan:   Continue Lovenox 50 mg SQ q12h  Could change Lovenox dosing to 75mg  SQ q24 hr if desired  Warfarin 5mg  po x 1 today, give earlier than usual 1800 dosing  CBC, SCr at least q72 hrs while on Lovenox  Daily PT/INR  F/u renal function, resolution of DVT symptoms, and for signs of bleeding  Warfarin video/book, pharmacy will provide education   Minda Ditto PharmD Pager [phone] 10/22/2014, 10:21 AM

## 2014-10-22 NOTE — Care Management Note (Signed)
CARE MANAGEMENT NOTE 10/22/2014  Patient:  Martha Mcbride, Martha Mcbride   Account Number:  [id number]  Date Initiated:  10/20/2014  Documentation initiated by:  Dessa Phi  Subjective/Objective Assessment:   81 y/o f admitted w/Sepsis.     Action/Plan:   Anticipated DC Date:  10/23/2014   Anticipated DC Plan:  SKILLED NURSING FACILITY  In-house referral  Clinical Social Worker      DC Planning Services  CM consult      Choice offered to / List presented to:  C-1 Patient           Status of service:  In process, will continue to follow Medicare Important Message given?  YES (If response is "NO", the following Medicare IM given date fields will be blank) Date Medicare IM given:  10/22/2014 Medicare IM given by:  Marney Doctor Date Additional Medicare IM given:   Additional Medicare IM given by:    Discharge Disposition:    Per UR Regulation:    If discussed at Long Length of Stay Meetings, dates discussed:    Comments:  10/22/14 Marney Doctor RN,BSN,NCM Pt and daughters deciding about pt going to SNF.  Spoke with daughter about wound care center plans.  Daughter states that they have an appointment set up at the wound center at [facility] on 11/02/14.  [facility] rep alerted that pt most likely going to SNF.  CM will continue to follow.  10/20/14 Dessa Phi RN BSN NCM [phone] has referral, & following.

## 2014-10-23 ENCOUNTER — Ambulatory Visit (HOSPITAL_COMMUNITY)
Admission: RE | Admit: 2014-10-23 | Discharge: 2014-10-23 | Disposition: A | Discharge status: Home / Self Care | Payer: 59 | Source: Ambulatory Visit | Attending: Internal Medicine | Admitting: Internal Medicine

## 2014-10-23 ENCOUNTER — Other Ambulatory Visit (HOSPITAL_COMMUNITY): Payer: 59

## 2014-10-23 ENCOUNTER — Inpatient Hospital Stay (HOSPITAL_COMMUNITY): Payer: Medicare Other

## 2014-10-23 LAB — URINE MICROSCOPIC-ADD ON

## 2014-10-23 LAB — COMPREHENSIVE METABOLIC PANEL
ALT: 20 U/L (ref 0–35)
ANION GAP: 8 (ref 5–15)
AST: 25 U/L (ref 0–37)
Albumin: 2.1 g/dL — ABNORMAL LOW (ref 3.5–5.2)
Alkaline Phosphatase: 110 U/L (ref 39–117)
BUN: 19 mg/dL (ref 6–23)
CALCIUM: 7.6 mg/dL — AB (ref 8.4–10.5)
CO2: 24 mmol/L (ref 19–32)
CREATININE: 0.48 mg/dL — AB (ref 0.50–1.10)
Chloride: 110 mmol/L (ref 96–112)
GFR calc Af Amer: >90 mL/min (ref >90)
GFR calc non Af Amer: 86 mL/min — ABNORMAL LOW (ref >90)
GLUCOSE: 113 mg/dL — AB (ref 70–99)
Potassium: 3.2 mmol/L — ABNORMAL LOW (ref 3.5–5.1)
SODIUM: 142 mmol/L (ref 135–145)
Total Bilirubin: 0.4 mg/dL (ref 0.3–1.2)
Total Protein: 4.9 g/dL — ABNORMAL LOW (ref 6.0–8.3)

## 2014-10-23 LAB — URINALYSIS, ROUTINE W REFLEX MICROSCOPIC
BILIRUBIN URINE: NEGATIVE
Glucose, UA: NEGATIVE mg/dL
Ketones, ur: NEGATIVE mg/dL
NITRITE: NEGATIVE
PH: 6 (ref 5.0–8.0)
PROTEIN: NEGATIVE mg/dL
SPECIFIC GRAVITY, URINE: 1.024 (ref 1.005–1.030)
UROBILINOGEN UA: 0.2 mg/dL (ref 0.0–1.0)

## 2014-10-23 LAB — CBC WITH DIFFERENTIAL/PLATELET
BASOS ABS: 0 10*3/uL (ref 0.0–0.1)
BASOS PCT: 0 % (ref 0–1)
Eosinophils Absolute: 0 10*3/uL (ref 0.0–0.7)
Eosinophils Relative: 0 % (ref 0–5)
HCT: 36.1 % (ref 36.0–46.0)
HEMOGLOBIN: 11.3 g/dL — AB (ref 12.0–15.0)
LYMPHS ABS: 1.7 10*3/uL (ref 0.7–4.0)
LYMPHS PCT: 37 % (ref 12–46)
MCH: 31.4 pg (ref 26.0–34.0)
MCHC: 31.3 g/dL (ref 30.0–36.0)
MCV: 100.3 fL — ABNORMAL HIGH (ref 78.0–100.0)
MONOS PCT: 8 % (ref 3–12)
Monocytes Absolute: 0.4 10*3/uL (ref 0.1–1.0)
NEUTROS ABS: 2.4 10*3/uL (ref 1.7–7.7)
Neutrophils Relative %: 55 % (ref 43–77)
Platelets: 134 10*3/uL — ABNORMAL LOW (ref 150–400)
RBC: 3.6 MIL/uL — AB (ref 3.87–5.11)
RDW: 16.7 % — AB (ref 11.5–15.5)
WBC: 4.5 10*3/uL (ref 4.0–10.5)

## 2014-10-23 LAB — GLUCOSE, CAPILLARY
GLUCOSE-CAPILLARY: 101 mg/dL — AB (ref 70–99)
Glucose-Capillary: 61 mg/dL — ABNORMAL LOW (ref 70–99)
Glucose-Capillary: 104 mg/dL — ABNORMAL HIGH (ref 70–99)
Glucose-Capillary: 110 mg/dL — ABNORMAL HIGH (ref 70–99)
Glucose-Capillary: 257 mg/dL — ABNORMAL HIGH (ref 70–99)
Glucose-Capillary: 266 mg/dL — ABNORMAL HIGH (ref 70–99)
Glucose-Capillary: 124 mg/dL — ABNORMAL HIGH (ref 70–99)

## 2014-10-23 LAB — MAGNESIUM
Magnesium: 2 mg/dL (ref 1.5–2.5)
Magnesium: 2 mg/dL (ref 1.5–2.5)

## 2014-10-23 LAB — PROTIME-INR
INR: 1.1 (ref 0.00–1.49)
PROTHROMBIN TIME: 14.3 s (ref 11.6–15.2)

## 2014-10-23 LAB — CLOSTRIDIUM DIFFICILE BY PCR: Toxigenic C. Difficile by PCR: NEGATIVE

## 2014-10-23 MED ORDER — VANCOMYCIN HCL 500 MG IV SOLR
500.0000 mg | Freq: Two times a day (BID) | INTRAVENOUS | Status: DC
  Administered 2014-10-24 – 2014-10-26 (×5): 500 mg via INTRAVENOUS
  Filled 2014-10-23 (×6): qty 500

## 2014-10-23 MED ORDER — DEXTROSE 50 % IV SOLN
INTRAVENOUS | Status: AC
  Filled 2014-10-23: qty 50

## 2014-10-23 MED ORDER — INSULIN ASPART 100 UNIT/ML ~~LOC~~ SOLN
0.0000 [IU] | Freq: Three times a day (TID) | SUBCUTANEOUS | Status: DC
  Administered 2014-10-23 (×2): 8 [IU] via SUBCUTANEOUS

## 2014-10-23 MED ORDER — PERATIVE PO LIQD
180.0000 mL | Freq: Every day | ORAL | Status: DC
  Administered 2014-10-23 (×3): 180 mL via ORAL
  Administered 2014-10-24: 180 mL/m2/min via ORAL
  Administered 2014-10-24 – 2014-10-26 (×9): 180 mL via ORAL
  Filled 2014-10-23 (×15): qty 180

## 2014-10-23 MED ORDER — PREDNISONE 5 MG PO TABS
15.0000 mg | ORAL_TABLET | Freq: Every day | ORAL | Status: DC
  Administered 2014-10-23 – 2014-10-26 (×4): 15 mg
  Filled 2014-10-23 (×5): qty 1

## 2014-10-23 MED ORDER — DEXTROSE 50 % IV SOLN
25.0000 mL | Freq: Once | INTRAVENOUS | Status: AC
  Administered 2014-10-23: 25 mL via INTRAVENOUS

## 2014-10-23 MED ORDER — WARFARIN SODIUM 5 MG PO TABS
5.0000 mg | ORAL_TABLET | Freq: Once | ORAL | Status: DC
  Filled 2014-10-23: qty 1

## 2014-10-23 MED ORDER — POTASSIUM CHLORIDE 20 MEQ/15ML (10%) PO SOLN
20.0000 meq | Freq: Once | ORAL | Status: AC
  Administered 2014-10-23: 20 meq
  Filled 2014-10-23: qty 15

## 2014-10-23 MED ORDER — DEXTROSE 5 % IV SOLN
1.0000 g | Freq: Three times a day (TID) | INTRAVENOUS | Status: DC
  Administered 2014-10-23 – 2014-10-26 (×8): 1 g via INTRAVENOUS
  Filled 2014-10-23 (×11): qty 1

## 2014-10-23 MED ORDER — VANCOMYCIN HCL IN DEXTROSE 1-5 GM/200ML-% IV SOLN
1000.0000 mg | Freq: Once | INTRAVENOUS | Status: AC
  Administered 2014-10-23: 1000 mg via INTRAVENOUS
  Filled 2014-10-23: qty 200

## 2014-10-23 MED ORDER — POTASSIUM CHLORIDE IN NACL 40-0.9 MEQ/L-% IV SOLN
INTRAVENOUS | Status: DC
  Administered 2014-10-23 – 2014-10-25 (×3): 75 mL/h via INTRAVENOUS
  Administered 2014-10-25: 50 mL/h via INTRAVENOUS
  Filled 2014-10-23 (×7): qty 1000

## 2014-10-23 NOTE — Progress Notes (Signed)
[name] from about 10 pm -12am had 5+ runny stools.  Patient was cleaned and dressings changed numerous times.  MD was notified.  Orders received to hold tube feedings till am and another Cdiff specimen was sent.  Patient was given a one time dose of lomotil and stools did decrease some. An IV was placed and IVF started.   Also, around 0210am, patient had a CBG of 61.  Patient was given 25 ml of 50%dextrose via IV.  Around 0230, patients blood sugar had gone up to 101.  At about the same time patient was shivering and BLE's were blue, mottled (change from previous assessment).  MD was notified and order was received to check a rectal temperature.  Patient's rectal temp was 101.1.  She was given 650 mg of tylenol via tube.  Patient appears to be more comfortable right now. Temperature is now at 99.4 orally and mottling is decreasing slightly.  Vitals other than temperature were baseline.  Will continue to monitor patient.Martha Mcbride

## 2014-10-23 NOTE — Progress Notes (Signed)
CSW continuing to follow.  Per MD, pt had fever and diarrhea overnight and not stable for discharge today.  CSW and RNCM met with pt daughter, [name] in hallway to discuss. CSW updated pt daughter that [facility] does not have any availability at this time and [facility] was able to offer a bed. CSW discussed that bed offers are dependent on availability when pt medically ready for discharge and CSW can continue to check on availability when we know an anticipated discharge date. Pt daughter expressed understanding.  CSW to continue to follow to assist with pt disposition to SNF. SNF in which pt discharges to will be dependent on family decision and availability at SNF.   Alison Murray, MSW, [place] [employer] [phone]

## 2014-10-23 NOTE — Progress Notes (Signed)
OT Cancellation Note  Patient Details Name: Martha Mcbride MRN: [id number] DOB: 08-03-1925   Cancelled Treatment:    Reason Eval/Treat Not Completed: Other (comment).  Pt does not feel well today.  Will check another day.  Christeena Krogh 10/23/2014, 2:08 PM  Lesle Chris, OTR/L [phone] 10/23/2014

## 2014-10-23 NOTE — Progress Notes (Signed)
Patient's daughter asked about lab results: specifically the norovirus PCR.   I called our lab department. They clarified that the sample was ordered on 10/16/14 and had indeed been collected. They gave me the phone number for the lab they had sent the sample out to [facility]) in order to be processed.   When I spoke to a representative at [facility] they clarified that the PCR had been referred to another different lab and that lab had processed the PCR on 10/20/14. It was explained that this kind of PCR could not be processed at the [facility] lab and/or the [facility] [facility] [employer]. The representative also stated it would take 5-7 days on average for it to be processed.   With this new information, I explained to the patient's daughter why the PCR had not been processed on 10/16/14, that it had been sent to a referring lab, and we were waiting for the processed results.

## 2014-10-23 NOTE — Progress Notes (Signed)
ANTICOAGULATION CONSULT NOTE - Follow-up Consult  Pharmacy Consult for Lovenox / Warfarin  Indication: DVT  Allergies  Allergen Reactions  . Doxycycline Nausea And Vomiting  . Other Nausea And Vomiting and Other (See Comments)    Anesthesia makes her very sleepy and makes it hard for her to come out of it.   Marland Kitchen Penicillins Hives and Nausea And Vomiting  . Sulfa Antibiotics Nausea And Vomiting   Patient Measurements: Height: 5\' 2"  (157.5 cm) Weight: 117 lb 11.6 oz (53.4 kg) IBW/kg (Calculated) : 50.1  Vital Signs: Temp: 98.9 F (37.2 C) (01/26 0627) Temp Source: Axillary (01/26 0627) BP: 120/60 mmHg (01/26 0627) Pulse Rate: 99 (01/26 0627)  Labs:  Recent Labs  10/21/14 0426 10/21/14 1221 10/22/14 0437 10/23/14 0015 10/23/14 0450  HGB 12.0  --  10.9* 11.3*  --   HCT 37.9  --  34.0* 36.1  --   PLT 99*  --  104* 134*  --   LABPROT  --  13.7 13.1  --  14.3  INR  --  1.04 0.98  --  1.10  CREATININE  --   --  0.41* 0.48*  --    Estimated Creatinine Clearance: 39.2 mL/min (by C-G formula based on Cr of 0.48).  Medications:  Scheduled:  . collagenase   Topical Daily  . enoxaparin (LOVENOX) injection  50 mg Subcutaneous Q12H  . feeding supplement (PRO-STAT SUGAR FREE 64)  30 mL Per Tube BID WC  . folic acid  1 mg Per Tube Daily  . free water  200 mL Per Tube Q6H  . insulin aspart  0-20 Units Subcutaneous 5 times per day  . lidocaine  1 application Topical Daily  . metoprolol tartrate  6.25 mg Per Tube BID  . nystatin  5 mL Oral QID  . ondansetron  4 mg Oral Once  . PERATIVE  240 mL Oral 5 X Daily  . potassium chloride  20 mEq Per Tube Daily  . predniSONE  15 mg Per Tube Q breakfast  . ranitidine  150 mg Per Tube QHS  . saccharomyces boulardii  250 mg Oral BID  . Warfarin - Pharmacist Dosing Inpatient   Does not apply q1800   Assessment: 89yoF admitted 1/19 for sepsis from wound infection vs UTI.  Now found to have new bilateral DVT.  Noted with thrombocytopenia,  possibly from sepsis vs ranitidine.  Suspected HIT during last admission, but HIT panel returned negative.  PMH: CHF, ILD, hiatal hernia s/p PEG tube placement, dementia.  Changed from prophylactic lovenox to treatment dose Lovenox on 1/23 pm, Pharmacy consulted to start warfarin on 1/24.   *Require 5 day overlap of Lovenox/Warfarin to include 48 hr of the overlap with therapeutic INR*  1/25: Day 3/5 overlap Baseline INR 1.04, INR 1.10 today after 2.5mg , 5mg Warfarin CBC: Thrombocytopenia from sepsis, plts continue to improve No bleeding issues noted Moderate renal impairment; low SCr (chair/bed bound at baseline) Note also on ASA 81, and was on Ciprofloxacin (discontinued 1/26) may cause elevations in INR. Diarrhea resolving, continues on Perative tube feed 5x/day  Goal of Therapy:  Anti-Xa level 0.6-1 units/ml 4hrs after LMWH dose given Monitor platelets by anticoagulation protocol: Yes  INR 2 - 3   Plan:   Continue Lovenox 50 mg SQ q12h  Could change Lovenox dosing to 75mg  SQ q24 hr if desired  Warfarin 5mg  po x 1 today  CBC, SCr at least q72 hrs while on Lovenox  Daily PT/INR  F/u renal function,  resolution of DVT symptoms, and for signs of bleeding  Warfarin video/book, pharmacy will provide education   Minda Ditto PharmD Pager [phone] 10/23/2014, 10:01 AM

## 2014-10-23 NOTE — Progress Notes (Signed)
Speech Language Pathology  Patient Details Name: Martha Mcbride MRN: [id number] DOB: [birth year] Today's Date: 10/23/2014 Time:  -      Pt scheduled for outpatient MBS today, however upon chart review noted that she is an inpatient at [facility]. If applicable and desired, please order MBS.    Orbie Pyo [place].Ed [employer] [phone]

## 2014-10-23 NOTE — Progress Notes (Addendum)
[facility] PROGRESS NOTE  Martha Mcbride DPO:[id number] DOB: [birth year] DOA: 10/16/2014 PCP: Kandice Hams, MD  Assessment/Plan: Active Problems:   Sepsis   Severe sepsis   New onset type 2 diabetes mellitus   UTI (lower urinary tract infection)   Sacral wound   Hyperglycemia   Protein-calorie malnutrition, severe   Diabetic hyperosmolar non-ketotic state   Decubitus skin ulcer     Patient tx to [facility] 10/19/14  Brief narrative: 81 y/o F with a past medical history of breast cancer (remote), dementia, chronic diastolic CHF (baseline Lasix 20 mg BID), aortic stenosis and severely calcified mitral valve with moderate stenosis, gangrenous toes on the right foot (followed by Dr. Oneida Alar), polymyalgia rheumatica on prednisone (20 mg QD), interstitial lung disease / presumed IPF, large hiatal hernia, severe dysphasia status post PEG tube placement, multiple admissions for aspiration pneumonia, chronic diarrhea, who is chair/bed bound at baseline (followed at the wound clinic at [facility]). She is cared for at home by her daughters [name] and [name].   Chart review notes that they call the cardiology office on 1/17 with concerns for tachycardia and lethargy. The daughters medicated the patient with an additional dose of Lopressor. They returned to the cardiology office for an acute visit on 1/18 with a three-day history of progressive weakness and fatigue. Notes reflect that a urinalysis was obtained and the family was encouraged to send the patient to the hospital for evaluation. She was treated with Cipro for UTI. However, they obtained a urinalysis and then proceeded to leave the office. They called the office to let them know that they were taking her home. Attempts were made to contact her via phone & messages were left at that time.   On 1/19 the patient presented to the [facility] emergency room via EMS with progressive confusion, lethargy and tachypnea. Initial BP 118/65, rate  133, rectal temperature of 101.2, RR 31 and SPO2 94%. Initial lab work notable for hgb 15.5, Na 151, CL 1:15, BUN 54, sr cr on 0.17, albumin 2.9, glucose 735 and lactic acid of 6.69. Repeat urinalysis on 1/19 with glucose greater than 1000, negative nitrite, negative leukocytes, 3-6 WBC, and few bacteria. CXR was negative for acute infiltrate. She was started on an insulin drip in the emergency room and treated with IV abx for potential wound infection/UTI. Of note, her caretakers have apparently had something similar to a viral gastroenteritis. PCCM consulted for ICU admission.  Consultants:  Critical care  Procedures:  None   Antiinfectives  1/19 >>aztreonam >> 1/22 1/19 >>vancomycin >> 1/22 1/19 >>metronidazole >> 1/22 1/19 >>levofloxacin >> 1/19 1/22 >> ciprofloxacin >> 2/1  1/22 > clindamycin >> 2/1   HOSPITAL COURSE:   Sacral decubitus ulcers/UTI/possible HCAP (1/26) I have repeatedly told the family that I do have a high concern for C. difficile, fortunately C. difficile has been negative so far . Discussed with daughter [name], communicated the results of the chest x-ray. Concern for aspiration pneumonia. She would like [place] to restart appropriate antibiotics for this with the understanding that she is already having diarrhea from her bolus feedings and this could be further aggravated by the antibiotics. I also told her that the patient will not be receiving any lomotil or Imodium if her diarrhea worsens. Daughter agreeable and would like me to restart vancomycin and aztreonam, stop date 1/29. Patient would have received a total of 10 days of antibiotic since admission. It would be too dangerous to continue the antibiotics beyond this timeframe.  BCx2 1/19 >> no growth so far UC 1/19 >> neg C-Diff 1/19 >> negative Norovirus 1/19 >> pending possible hyperbaric therapy at the wound care center once the patient is discharged. Patient also needs to discuss slowly tapering her  steroid regimen with her rheumatologist as this is interfering with wound healing Repeat UA, chest x-ray today because of fever last night Continue florastor  CT negative for abscess. 1/19 Consider hyperbaric Rx as an outpatient , patient has seen a plastic surgeon in [place] and should also follow-up with them    Bilateral DVT,Right: DVT noted in the FV and popliteal vein. Left: DVT noted in the CFV, FV, popliteal vein Lovenox therapeutic dose started 1/23 and Coumadin started 1/24, Require 5 day overlap of Lovenox/Warfarin to include 48 hr of the overlap with therapeutic INR Recommend daily INR monitoring for the first week Then weekly 4 weeks and then monthly   interstitial lung disease/aspiration risk Repeat chest x-ray today to rule out pneumonia Continue Aspiration precautions, pulmonary hygiene continue tube feedings Continue taking aspiration precautions and checking post tube feeding residual Taper steroids down to 15 mg a day  Thrush Started on nystatin swish and spit    Sepsis, secondary to sacral decubitus ulcer/UTI Now improved, discontinued broad-spectrum antibiotics and discontinued ciprofloxacin, clindamycin 1/26, because of severe diarrhea Stress dose steroids until 1/20 Now initiated the patient on steroid taper   Hypertension,AS - moderate on TEE 07/20/14 & calcified MV Continue Lopressor, continue low-dose Lasix   Hypernatremia, hypokalemia , acute kidney injury-secondary to dehydration This had improved by the patient is hypokalemic again because of diarrhea Will check magnesium Continue free water through feeding tube Continue Foley catheter given perineal ulcers BMP weekly   Hyperglycemia,DM 2  discontinued Lantus , glipizide as this is invariably caused 4 AM hypoglycemia Continue SSI with tube feeding boluses, and follow sliding scale     Severe Dysphagia - s/p PEG, NPO at baseline (since late fall 2015). In setting of large hiatal  hernia. They have been doing evaluations with SLP for swallowing at home despite NPO status / rec's with concern for aspiration.  Continue home PEG tube feeding, nutrition reconsulted to advance feeding Unfortunately because of diarrhea we will go down on the tube feeding to 180 mL from 240 mL bolus    Thrombocytopenia likely secondary to sepsis Follow CBC. Thrombocytopenia is improving , platelet count 134   Code Status: full Family Communication: family updated about patient's clinical progress, spent 30 mins discussing with daughter [name] in the room Disposition Plan: [place] discharge because of fever and diarrhea   Brief narrative: 81 y/o F with a past medical history of breast cancer (remote), dementia, chronic diastolic CHF (baseline Lasix 20 mg BID), aortic stenosis and severely calcified mitral valve with moderate stenosis, gangrenous toes on the right foot (followed by Dr. Oneida Alar), polymyalgia rheumatica on prednisone (20 mg QD), interstitial lung disease / presumed IPF, large hiatal hernia, severe dysphasia status post PEG tube placement, multiple admissions for aspiration pneumonia, chronic diarrhea, who is chair/bed bound at baseline (followed at the wound clinic at [facility]). She is cared for at home by her daughters [name] and [name].   Chart review notes that they call the cardiology office on 1/17 with concerns for tachycardia and lethargy. The daughters medicated the patient with an additional dose of Lopressor. They returned to the cardiology office for an acute visit on 1/18 with a three-day history of progressive weakness and fatigue. Notes reflect that a urinalysis was obtained  and the family was encouraged to send the patient to the hospital for evaluation. She was treated with Cipro for UTI. However, they obtained a urinalysis and then proceeded to leave the office. They called the office to let them know that they were taking her home. Attempts were made to contact her  via phone & messages were left at that time.   On 1/19 the patient presented to the [facility] emergency room via EMS with progressive confusion, lethargy and tachypnea. Initial BP 118/65, rate 133, rectal temperature of 101.2, RR 31 and SPO2 94%. Initial lab work notable for hgb 15.5, Na 151, CL 1:15, BUN 54, sr cr on 0.17, albumin 2.9, glucose 735 and lactic acid of 6.69. Repeat urinalysis on 1/19 with glucose greater than 1000, negative nitrite, negative leukocytes, 3-6 WBC, and few bacteria. CXR was negative for acute infiltrate. She was started on an insulin drip in the emergency room and treated with IV abx for potential wound infection/UTI. Of note, her caretakers have apparently had something similar to a viral gastroenteritis. PCCM consulted for ICU admission.  Consultants:  Critical care  Procedures:  None   Antiinfectives  1/19 >>aztreonam >> 1/22 1/19 >>vancomycin >> 1/22 1/19 >>metronidazole >> 1/22 1/19 >>levofloxacin >> 1/19 1/22 >> ciprofloxacin >> 1/22 > clindamycin >>  HPI/Subjective: Patient not having diarrhea, multiple bowel movements last night, requiring multiple dressing changes on her sacral wounds, received 1 dose of Lomotil, slight improvement in diarrhea. Fever of 101.1, received Tylenol via tube  Objective: Filed Vitals:   10/23/14 0238 10/23/14 0320 10/23/14 0430 10/23/14 0627  BP: 151/85   120/60  Pulse: 100   99  Temp: 98 F (36.7 C) 101.1 F (38.4 C) 99.4 F (37.4 C) 98.9 F (37.2 C)  TempSrc: Oral Rectal Oral Axillary  Resp: 16   16  Height:      Weight:      SpO2: 98%   98%    Intake/Output Summary (Last 24 hours) at 10/23/14 0981 Last data filed at 10/23/14 1914  Gross per 24 hour  Intake    995 ml  Output   1400 ml  Net   -405 ml    Exam: General: Frail elderly female, awake/pleasant , dehydration Neuro: Awake/alert, oriented to self, place & events, MAE, generalized weakness  HEENT: Mm pink/dry, no jvd, dentures in  place  Cardiovascular: s1s2 rrr, 3/6 SEM Lungs: Even/non-labored, lungs bilaterally with fine basilar crackles Abdomen: PEG c/d/i, NTND Musculoskeletal: Left lower extremity erythema improved Skin: Warm/dry, good cap refills, 1+ pitting edema in BLE, sacral/rectal wound erythematous, ~ stage 3 with rectal excoriation with yellow/brown crusting & drainage, deep fissure like open wound approx 3 inches in length in intergluteal cleft. R foot 2nd/3rd toes with chronic dry gangrene (unchanged from prior admits)       Data Reviewed: Basic Metabolic Panel:  Recent Labs Lab 10/17/14 0409  10/19/14 1100 10/19/14 1105 10/20/14 0425 10/20/14 0653 10/22/14 0437 10/23/14 0015  NA 156*  < >  --  143 142 142 139 142  K 3.9  < >  --  3.4* 5.8* 3.8 3.6 3.2*  CL 124*  < >  --  115* 117* 115* 112 110  CO2 23  < >  --  23 20 21 22 24   GLUCOSE 165*  < >  --  159* 76 179* 81 113*  BUN 34*  < >  --  18 14 15 17 19   CREATININE 0.62  < >  --  0.61 0.52 0.54 0.41* 0.48*  CALCIUM 7.7*  < >  --  7.2* 7.0* 7.1* 7.3* 7.6*  MG 2.3  --  2.0  --   --   --   --  2.0  PHOS 3.0  --   --   --   --   --   --   --   < > = values in this interval not displayed.  Liver Function Tests:  Recent Labs Lab 10/19/14 1105 10/20/14 0425 10/20/14 0653 10/22/14 0437 10/23/14 0015  AST 22 60* 24 21 25   ALT 17 10 18 15 20   ALKPHOS 117 101 110 101 110  BILITOT 0.5 2.4* 0.4 0.4 0.4  PROT 5.1* 4.1* 4.7* 4.6* 4.9*  ALBUMIN 2.3* 2.0* 2.2* 2.1* 2.1*   No results for input(s): LIPASE, AMYLASE in the last 168 hours. No results for input(s): AMMONIA in the last 168 hours.  CBC:  Recent Labs Lab 10/18/14 0620 10/19/14 1105 10/21/14 0426 10/22/14 0437 10/23/14 0015  WBC 7.7 7.2 6.6 5.1 4.5  NEUTROABS  --   --   --   --  2.4  HGB 11.6* 12.0 12.0 10.9* 11.3*  HCT 38.1 38.7 37.9 34.0* 36.1  MCV 103.0* 102.9* 99.5 100.3* 100.3*  PLT 83* 72* 99* 104* 134*    Cardiac Enzymes: No results for input(s):  CKTOTAL, CKMB, CKMBINDEX, TROPONINI in the last 168 hours. BNP (last 3 results)  Recent Labs  07/19/14 2039 07/28/14 1225 07/29/14 0330  PROBNP 1467.0* 576.5* 753.4*     CBG:  Recent Labs Lab 10/22/14 1753 10/22/14 2155 10/23/14 0210 10/23/14 0234 10/23/14 0612  GLUCAP 312* 172* 61* 101* 104*    Recent Results (from the past 240 hour(s))  Blood Culture (routine x 2)     Status: None   Collection Time: 10/16/14  7:39 AM  Result Value Ref Range Status   Specimen Description BLOOD RIGHT HAND  Final   Special Requests BOTTLES DRAWN AEROBIC AND ANAEROBIC 3ML  Final   Culture   Final    NO GROWTH 5 DAYS Performed at [facility]    Report Status 10/22/2014 FINAL  Final  Blood Culture (routine x 2)     Status: None   Collection Time: 10/16/14  7:55 AM  Result Value Ref Range Status   Specimen Description BLOOD RIGHT ANTECUBITAL  Final   Special Requests BOTTLES DRAWN AEROBIC AND ANAEROBIC 5ML  Final   Culture   Final    NO GROWTH 5 DAYS Performed at [facility]    Report Status 10/22/2014 FINAL  Final  Wound culture     Status: None   Collection Time: 10/16/14  8:25 AM  Result Value Ref Range Status   Specimen Description WOUND VAGINA  Final   Special Requests NONE  Final   Gram Stain   Final    FEW WBC PRESENT,BOTH PMN AND MONONUCLEAR NO SQUAMOUS EPITHELIAL CELLS SEEN RARE GRAM POSITIVE COCCI IN PAIRS IN CLUSTERS RARE GRAM NEGATIVE RODS Performed at [facility]    Culture   Final    FEW YEAST CONSISTENT WITH CANDIDA SPECIES Performed at [facility]    Report Status 10/18/2014 FINAL  Final  Urine culture     Status: None   Collection Time: 10/16/14  8:27 AM  Result Value Ref Range Status   Specimen Description URINE, CATHETERIZED  Final   Special Requests NONE  Final   Colony Count NO GROWTH Performed at [facility]  Final   Culture NO GROWTH Performed at [facility]   Final   Report Status  10/17/2014 FINAL  Final  MRSA PCR Screening     Status: None   Collection Time: 10/16/14  1:30 PM  Result Value Ref Range Status   MRSA by PCR NEGATIVE NEGATIVE Final    Comment:        The GeneXpert MRSA Assay (FDA approved for NASAL specimens only), is one component of a comprehensive MRSA colonization surveillance program. It is not intended to diagnose MRSA infection nor to guide or monitor treatment for MRSA infections.   Clostridium Difficile by PCR     Status: None   Collection Time: 10/18/14  9:50 AM  Result Value Ref Range Status   C difficile by pcr NEGATIVE NEGATIVE Final    Comment: Performed at [facility]     Studies: Ct Abdomen Pelvis Wo Contrast  10/16/2014   CLINICAL DATA:  Decubitus ulcer  EXAM: CT ABDOMEN AND PELVIS WITHOUT CONTRAST  TECHNIQUE: Multidetector CT imaging of the abdomen and pelvis was performed following the standard protocol without IV contrast.  COMPARISON:  05/21/2012  FINDINGS: BODY WALL:  There is ulceration of the mid gluteal cleft with granulation tissue or other soft tissue extends to contact the sacrum, appearance similar to previous. There are no sacral changes of osteomyelitis. Fissured appearance of the lower gluteal cleft, extending towards the anus. There is skin thickening and subcutaneous reticulation in the lower left gluteal region which likely also represents pressure changes. No evidence of tract.  Lumpectomy in the lower right breast. Surgical size stable from prior.  LOWER CHEST: There is a moderate sliding-type hiatal hernia. Detection of esophageal or gastric thickening not possible due to under distention and lack of IV contrast.  Bulky mitral annular calcification noted. Chronic subpleural reticulation in the lower lungs.  ABDOMEN/PELVIS:  Liver: Small cysts again noted in the liver.  Biliary: Cholelithiasis. Gallbladder is full but there is no inflammatory change.  Pancreas: Unremarkable.  Spleen: Unremarkable.  Adrenals:  Unremarkable.  Kidneys and ureters: 2 small high-density areas within the lower right kidney likely reflects small hemorrhagic cyst. Small low-density foci in the more cranial right renal cortex is likely also cysts.  Bladder: Decompressed by Foley catheter.  Reproductive: Hysterectomy. Decreased size of a cyst in the right hemipelvis, currently 4.8 cm previously 6.4 cm. As noted previously this could be an ovarian or peritoneal cyst.  Bowel: Sliding hiatal hernia. There is percutaneous gastrostomy tube which is in good position. No pericecal inflammation.  Retroperitoneum: No mass or adenopathy.  Peritoneum: No ascites or pneumoperitoneum.  Vascular: No acute abnormality.  OSSEOUS: Healed insufficiency fractures of the right obturator ring. Diffuse and advanced lumbar degenerative disc disease with levoscoliosis. No acute fracture.  IMPRESSION: 1. Decubitus ulcers, the deepest in the gluteal cleft. No abscess, undermining, or osteomyelitis identified. 2. Chronic findings are stable from 2013 and noted above.   Electronically Signed   By: Jorje Guild M.D.   On: 10/16/2014 22:03   Dg Chest 1 View  09/26/2014   CLINICAL DATA:  Pneumonia.  EXAM: CHEST - 1 VIEW  COMPARISON:  09/14/2014.  02/10/2014.  FINDINGS: Mediastinum and hilar structures normal. Cardiomegaly with normal pulmonary vascularity. No focal pulmonary infiltrate. Chronic interstitial prominence. No pleural effusion or pneumothorax. Degenerative changes thoracic spine. Sliding hiatal hernia.  IMPRESSION: 1. Cardiomegaly.  No overt pulmonary edema. 2. Chronic interstitial lung disease. No focal pulmonary infiltrate.   Electronically Signed  By: [place]   On: 09/26/2014 11:36   Dg Chest Port 1 View  10/16/2014   CLINICAL DATA:  Shortness of breath and lethargy  EXAM: PORTABLE CHEST - 1 VIEW  COMPARISON:  September 26, 2014  FINDINGS: There is no edema or consolidation. Heart size and pulmonary vascularity are normal. No adenopathy. There  is calcification in the mitral annulus. There is degenerative change in the lower thoracic and upper lumbar region with upper lumbar levoscoliosis.  IMPRESSION: No edema or consolidation.   Electronically Signed   By: Lowella Grip M.D.   On: 10/16/2014 08:16    Scheduled Meds: . ciprofloxacin  500 mg Per Tube BID  . collagenase   Topical Daily  . enoxaparin (LOVENOX) injection  50 mg Subcutaneous Q12H  . feeding supplement (PRO-STAT SUGAR FREE 64)  30 mL Per Tube BID WC  . folic acid  1 mg Per Tube Daily  . free water  200 mL Per Tube Q6H  . insulin aspart  0-20 Units Subcutaneous 5 times per day  . lidocaine  1 application Topical Daily  . metoprolol tartrate  6.25 mg Per Tube BID  . nystatin  5 mL Oral QID  . ondansetron  4 mg Oral Once  . PERATIVE  240 mL Oral 5 X Daily  . potassium chloride  20 mEq Per Tube Daily  . predniSONE  20 mg Per Tube Daily  . ranitidine  150 mg Per Tube QHS  . saccharomyces boulardii  250 mg Oral BID  . Warfarin - Pharmacist Dosing Inpatient   Does not apply q1800   Continuous Infusions: . sodium chloride 75 mL/hr at 10/22/14 2330    Active Problems:   Sepsis   Severe sepsis   New onset type 2 diabetes mellitus   UTI (lower urinary tract infection)   Sacral wound   Hyperglycemia   Protein-calorie malnutrition, severe   Diabetic hyperosmolar non-ketotic state   Decubitus skin ulcer    Time spent: 40 minutes   [facility] Pager [phone]. If 7PM-7AM, please contact night-coverage at [email or web address], password [facility] 10/23/2014, 8:21 AM  LOS: 7 days

## 2014-10-23 NOTE — Progress Notes (Signed)
ANTIBIOTIC CONSULT NOTE - INITIAL  Pharmacy Consult for Vancomycin & Aztreonam Indication: HCAP  Allergies  Allergen Reactions  . Doxycycline Nausea And Vomiting  . Other Nausea And Vomiting and Other (See Comments)    Anesthesia makes her very sleepy and makes it hard for her to come out of it.   Marland Kitchen Penicillins Hives and Nausea And Vomiting  . Sulfa Antibiotics Nausea And Vomiting   Patient Measurements: Height: 5\' 2"  (157.5 cm) Weight: 117 lb 11.6 oz (53.4 kg) IBW/kg (Calculated) : 50.1  Assessment: 81 y/o F brought to ED 1/19 with fever, fatigue, AMS (seen in PCP office 1/17, Cardiology office 1/18 - suggested patient needed to be hospitalized). UA consistent with UTI, multiple deep decubitus ulcers present on buttocks, and lactate 6.69 - code sepsis called.  Aztreonam, vanco, metronidazole, levofloxacin ordered with pharmacy dosing assistance requested.  Patient had started on Cipro PTA as outpatient for UTI.  On 1/22, Pharmacy was consulted to to change antibiotics to ciprofloxacin for UTI. Clindamycin added for cellulitis coverage.   CXray with infiltrates, rule out HCAP  Antiinfectives  1/19 >>aztreonam >> 1/22 1/19 >>vancomycin >> 1/22 1/19 >>metronidazole >> 1/22 1/19 >>levofloxacin >> 1/19 1/22 >> ciprofloxacin >> 1/26 1/22 > clindamycin >> 1/26 1/26 >> Vancomycin >> 1/26 >> Aztreonam >>  Labs / vitals Tmax: temp spike to 101.1 this am WBCs: 4.5K Renal: SCr low, stable 0.48  (baseline appears ~0.5)  Microbiology 1/19 blood x2: NG-final 1/19 urine: NG-final 1/19 wound (vagina): few candida sp- final 1/19 MRSA PCR: negative 1/21 Norovirus: remains in process 1/21 Cdiff: negative 1/25 CDiff: negative  Goal of Therapy:  Vancomycin trough 15-20 mcg/ml  Plan:   Vancomycin 1gm x1, then 500mg  q12  Aztreonam 1gm q8  Monitor renal function closely, adjust abx as necessary  Thank you for the consult,  Minda Ditto PharmD Pager [phone] 10/23/2014, 12:16  PM

## 2014-10-24 ENCOUNTER — Inpatient Hospital Stay (HOSPITAL_COMMUNITY): Payer: Medicare Other

## 2014-10-24 ENCOUNTER — Encounter: Payer: Self-pay | Admitting: Vascular Surgery

## 2014-10-24 ENCOUNTER — Encounter (HOSPITAL_COMMUNITY): Payer: Self-pay | Admitting: Radiology

## 2014-10-24 DIAGNOSIS — I82409 Acute embolism and thrombosis of unspecified deep veins of unspecified lower extremity: Secondary | ICD-10-CM | POA: Insufficient documentation

## 2014-10-24 LAB — GLUCOSE, CAPILLARY
Glucose-Capillary: 121 mg/dL — ABNORMAL HIGH (ref 70–99)
Glucose-Capillary: 97 mg/dL (ref 70–99)
Glucose-Capillary: 193 mg/dL — ABNORMAL HIGH (ref 70–99)
Glucose-Capillary: 187 mg/dL — ABNORMAL HIGH (ref 70–99)
Glucose-Capillary: 108 mg/dL — ABNORMAL HIGH (ref 70–99)

## 2014-10-24 LAB — COMPREHENSIVE METABOLIC PANEL
ALT: 19 U/L (ref 0–35)
AST: 23 U/L (ref 0–37)
Albumin: 2.1 g/dL — ABNORMAL LOW (ref 3.5–5.2)
Alkaline Phosphatase: 104 U/L (ref 39–117)
Anion gap: 5 (ref 5–15)
BUN: 17 mg/dL (ref 6–23)
CALCIUM: 7.6 mg/dL — AB (ref 8.4–10.5)
CO2: 22 mmol/L (ref 19–32)
CREATININE: 0.46 mg/dL — AB (ref 0.50–1.10)
Chloride: 112 mmol/L (ref 96–112)
GFR calc Af Amer: >90 mL/min (ref >90)
GFR calc non Af Amer: 87 mL/min — ABNORMAL LOW (ref >90)
Glucose, Bld: 117 mg/dL — ABNORMAL HIGH (ref 70–99)
Potassium: 4.2 mmol/L (ref 3.5–5.1)
Sodium: 139 mmol/L (ref 135–145)
Total Bilirubin: 0.7 mg/dL (ref 0.3–1.2)
Total Protein: 4.7 g/dL — ABNORMAL LOW (ref 6.0–8.3)

## 2014-10-24 LAB — PROTIME-INR
INR: 1.03 (ref 0.00–1.49)
Prothrombin Time: 13.6 seconds (ref 11.6–15.2)

## 2014-10-24 LAB — CBC
HEMATOCRIT: 34.4 % — AB (ref 36.0–46.0)
Hemoglobin: 10.9 g/dL — ABNORMAL LOW (ref 12.0–15.0)
MCH: 31.6 pg (ref 26.0–34.0)
MCHC: 31.7 g/dL (ref 30.0–36.0)
MCV: 99.7 fL (ref 78.0–100.0)
Platelets: 137 10*3/uL — ABNORMAL LOW (ref 150–400)
RBC: 3.45 MIL/uL — AB (ref 3.87–5.11)
RDW: 16.9 % — ABNORMAL HIGH (ref 11.5–15.5)
WBC: 4.8 10*3/uL (ref 4.0–10.5)

## 2014-10-24 MED ORDER — TRAMADOL HCL 50 MG PO TABS
50.0000 mg | ORAL_TABLET | Freq: Three times a day (TID) | ORAL | Status: DC | PRN
  Administered 2014-10-24 – 2014-10-26 (×3): 50 mg via ORAL
  Filled 2014-10-24 (×3): qty 1

## 2014-10-24 MED ORDER — INSULIN ASPART 100 UNIT/ML ~~LOC~~ SOLN
0.0000 [IU] | SUBCUTANEOUS | Status: DC
Start: 2014-10-24 — End: 2014-10-26
  Administered 2014-10-24 – 2014-10-25 (×2): 3 [IU] via SUBCUTANEOUS
  Administered 2014-10-26: 5 [IU] via SUBCUTANEOUS
  Administered 2014-10-26: 2 [IU] via SUBCUTANEOUS

## 2014-10-24 MED ORDER — SILVER SULFADIAZINE 1 % EX CREA
TOPICAL_CREAM | Freq: Four times a day (QID) | CUTANEOUS | Status: DC
  Administered 2014-10-24: 19:00:00 via TOPICAL
  Administered 2014-10-24: 1 via TOPICAL
  Administered 2014-10-25 – 2014-10-26 (×5): via TOPICAL
  Filled 2014-10-24: qty 85

## 2014-10-24 NOTE — Progress Notes (Addendum)
[facility] PROGRESS NOTE  Martha Mcbride UJW:[id number] DOB: [birth year] DOA: 10/16/2014 PCP: Martha Hams, MD  Assessment/Plan: Active Problems:   Sepsis   Severe sepsis   New onset type 2 diabetes mellitus   UTI (lower urinary tract infection)   Sacral wound   Hyperglycemia   Protein-calorie malnutrition, severe   Diabetic hyperosmolar non-ketotic state   Decubitus skin ulcer     Patient tx to [facility] 10/19/14  Brief narrative: 81 y/o F with a past medical history of breast cancer (remote), dementia, chronic diastolic CHF (baseline Lasix 20 mg BID), aortic stenosis and severely calcified mitral valve with moderate stenosis, gangrenous toes on the right foot (followed by Dr. Oneida Mcbride), polymyalgia rheumatica on prednisone (20 mg QD), interstitial lung disease / presumed IPF, large hiatal hernia, severe dysphasia status post PEG tube placement, multiple admissions for aspiration pneumonia, chronic diarrhea, who is chair/bed bound at baseline (followed at the wound clinic at [facility]). She is cared for at home by her daughters Martha Mcbride and Martha Mcbride.   Chart review notes that they call the cardiology office on 1/17 with concerns for tachycardia and lethargy. The daughters medicated the patient with an additional dose of Lopressor. They returned to the cardiology office for an acute visit on 1/18 with a three-day history of progressive weakness and fatigue. Notes reflect that a urinalysis was obtained and the family was encouraged to send the patient to the hospital for evaluation. She was treated with Cipro for UTI. However, they obtained a urinalysis and then proceeded to leave the office. They called the office to let them know that they were taking her home. Attempts were made to contact her via phone & messages were left at that time.   On 1/19 the patient presented to the [facility] emergency room via EMS with progressive confusion, lethargy and tachypnea. Initial BP 118/65, rate  133, rectal temperature of 101.2, RR 31 and SPO2 94%. Initial lab work notable for hgb 15.5, Na 151, CL 1:15, BUN 54, sr cr on 0.17, albumin 2.9, glucose 735 and lactic acid of 6.69. Repeat urinalysis on 1/19 with glucose greater than 1000, negative nitrite, negative leukocytes, 3-6 WBC, and few bacteria. CXR was negative for acute infiltrate. She was started on an insulin drip in the emergency room and treated with IV abx for potential wound infection/UTI. Of note, her caretakers have apparently had something similar to a viral gastroenteritis. PCCM consulted for ICU admission.  Consultants:  Critical care  Procedures:  None   Antiinfectives  1/19 >>aztreonam >> 1/22 1/19 >>vancomycin >> 1/22 1/19 >>metronidazole >> 1/22 1/19 >>levofloxacin >> 1/19 1/22 >> ciprofloxacin >> 2/1  1/22 > clindamycin >> 2/1   HOSPITAL COURSE:   Sacral decubitus ulcers/UTI/possible HCAP (1/26) I have repeatedly told the family that I do have a high concern for C. difficile, fortunately C. difficile has been negative so far . Discussed with daughter Martha Mcbride 1/26, communicated the results of the chest x-ray. Concern for aspiration pneumonia. She would like [place] to restart appropriate antibiotics for this with the understanding that she is already having diarrhea from her bolus feedings and this could be further aggravated by the antibiotics. I also told her that the patient will not be receiving any lomotil or Imodium if her diarrhea worsens. Daughter agreeable and would like me to restart vancomycin and aztreonam, stop date 1/29. Patient would have received a total of 10 days of antibiotic since admission. It would be too dangerous to continue the antibiotics beyond this  timeframe. Continue vancomycin and aztreonam BCx2 1/19 and 1/26 >> no growth so far UC 1/19 >> neg C-Diff 1/19 >> negative Norovirus 1/19 >> pending possible hyperbaric therapy at the wound care center once the patient is  discharged. Patient now on a steroid taper as advised by her rheumatologist Dr Martha Mcbride , as this is interfering with wound healing Continue florastor  CT negative for abscess. 1/19 Consider hyperbaric Rx as an outpatient , patient has seen a plastic surgeon in [place] and should also follow-up with them    Bilateral DVT,Right: DVT noted in the FV and popliteal vein. Left: DVT noted in the CFV, FV, popliteal vein Lovenox therapeutic dose started 1/23 , family refused Coumadin last night They are of the opinion that  patient's diarrhea is  Caused by  Coumadin They would like to talk to interventional radiology before accepting an IVC filter Family not interested in long-term anticoagulation at this point    interstitial lung disease/aspiration risk Continue Aspiration precautions, pulmonary hygiene continue tube feedings Continue checking post tube feeding residuals Tapering steroids down to 15 mg a day, will stay on this dose for another several weeks  Thrush-improved Started on nystatin swish and spit     Hypertension,AS - moderate on TEE 07/20/14 & calcified MV Continue Lopressor, continue low-dose Lasix   Hypernatremia, hypokalemia , acute kidney injury-secondary to dehydration All of the above has resolved, continue low-dose maintenance hydration Monitor EMB, magnesium closely Continue free water through feeding tube Continue Foley catheter given perineal ulcers     Hyperglycemia,DM 2  discontinued Lantus , glipizide as this is invariably caused 4 AM hypoglycemia Continue SSI with tube feeding boluses, patient switched to moderate sliding scale yesterday because of hypoglycemia     Severe Dysphagia - s/p PEG, NPO at baseline (since late fall 2015). In setting of large hiatal hernia. They have been doing evaluations with SLP for swallowing at home despite NPO status / rec's with concern for aspiration.  Continue home PEG tube feeding, nutrition reconsulted  to advance feeding Because of diarrhea, tube feeding  bolus changed to 180 mL from 240 mL bolus    Thrombocytopenia likely secondary to sepsis Follow CBC. Thrombocytopenia is improving , platelet count 137   Code Status: full Family Communication: family updated about patient's clinical progress, spent 30 mins discussing with daughter Martha Mcbride in the room Disposition Plan: [facility] discharge because of fever and diarrhea   Brief narrative: 81 y/o F with a past medical history of breast cancer (remote), dementia, chronic diastolic CHF (baseline Lasix 20 mg BID), aortic stenosis and severely calcified mitral valve with moderate stenosis, gangrenous toes on the right foot (followed by Dr. Oneida Mcbride), polymyalgia rheumatica on prednisone (20 mg QD), interstitial lung disease / presumed IPF, large hiatal hernia, severe dysphasia status post PEG tube placement, multiple admissions for aspiration pneumonia, chronic diarrhea, who is chair/bed bound at baseline (followed at the wound clinic at [facility]). She is cared for at home by her daughters Martha Mcbride and Martha Mcbride.   Chart review notes that they call the cardiology office on 1/17 with concerns for tachycardia and lethargy. The daughters medicated the patient with an additional dose of Lopressor. They returned to the cardiology office for an acute visit on 1/18 with a three-day history of progressive weakness and fatigue. Notes reflect that a urinalysis was obtained and the family was encouraged to send the patient to the hospital for evaluation. She was treated with Cipro for UTI. However, they obtained a urinalysis and then  proceeded to leave the office. They called the office to let them know that they were taking her home. Attempts were made to contact her via phone & messages were left at that time.   On 1/19 the patient presented to the [facility] emergency room via EMS with progressive confusion, lethargy and tachypnea. Initial BP 118/65, rate 133,  rectal temperature of 101.2, RR 31 and SPO2 94%. Initial lab work notable for hgb 15.5, Na 151, CL 1:15, BUN 54, sr cr on 0.17, albumin 2.9, glucose 735 and lactic acid of 6.69. Repeat urinalysis on 1/19 with glucose greater than 1000, negative nitrite, negative leukocytes, 3-6 WBC, and few bacteria. CXR was negative for acute infiltrate. She was started on an insulin drip in the emergency room and treated with IV abx for potential wound infection/UTI. Of note, her caretakers have apparently had something similar to a viral gastroenteritis. PCCM consulted for ICU admission.  Consultants:  Critical care  Procedures:  None   Antiinfectives  1/19 >>aztreonam >> 1/22 1/19 >>vancomycin >> 1/22 1/19 >>metronidazole >> 1/22 1/19 >>levofloxacin >> 1/19 1/22 >> ciprofloxacin >> 1/22 > clindamycin >>  HPI/Subjective: Diarrhea is improving, patient does not have any specific complaints, minimal pain around her wounds  Objective: Filed Vitals:   10/23/14 1424 10/23/14 1831 10/23/14 2140 10/24/14 0704  BP: 107/57  105/56 109/64  Pulse: 95  85 79  Temp: 98.1 F (36.7 C)  98 F (36.7 C) 98.1 F (36.7 C)  TempSrc: Axillary  Oral Oral  Resp: 16  17 16   Height:      Weight:  52.164 kg (115 lb)  52.209 kg (115 lb 1.6 oz)  SpO2: 96%  99% 99%    Intake/Output Summary (Last 24 hours) at 10/24/14 1050 Last data filed at 10/24/14 5027  Gross per 24 hour  Intake   2255 ml  Output    300 ml  Net   1955 ml    Exam: General: Frail elderly female, awake/pleasant , dehydration Neuro: Awake/alert, oriented to self, place & events, MAE, generalized weakness  HEENT: Mm pink/dry, no jvd, dentures in place  Cardiovascular: s1s2 rrr, 3/6 SEM Lungs: Even/non-labored, lungs bilaterally with fine basilar crackles Abdomen: PEG c/d/i, NTND Musculoskeletal: Left lower extremity erythema improved Skin: Warm/dry, good cap refills, 1+ pitting edema in BLE, sacral/rectal wound erythematous,  ~ stage 3 with rectal excoriation with yellow/brown crusting & drainage, deep fissure like open wound approx 3 inches in length in intergluteal cleft. R foot 2nd/3rd toes with chronic dry gangrene (unchanged from prior admits)       Data Reviewed: Basic Metabolic Panel:  Recent Labs Lab 10/19/14 1100  10/20/14 0425 10/20/14 7412 10/22/14 0437 10/23/14 0015 10/23/14 0456 10/24/14 0417  NA  --   < > 142 142 139 142  --  139  K  --   < > 5.8* 3.8 3.6 3.2*  --  4.2  CL  --   < > 117* 115* 112 110  --  112  CO2  --   < > 20 21 22 24   --  22  GLUCOSE  --   < > 76 179* 81 113*  --  117*  BUN  --   < > 14 15 17 19   --  17  CREATININE  --   < > 0.52 0.54 0.41* 0.48*  --  0.46*  CALCIUM  --   < > 7.0* 7.1* 7.3* 7.6*  --  7.6*  MG 2.0  --   --   --   --  2.0 2.0  --   < > = values in this interval not displayed.  Liver Function Tests:  Recent Labs Lab 10/20/14 0425 10/20/14 0653 10/22/14 0437 10/23/14 0015 10/24/14 0417  AST 60* 24 21 25 23   ALT 10 18 15 20 19   ALKPHOS 101 110 101 110 104  BILITOT 2.4* 0.4 0.4 0.4 0.7  PROT 4.1* 4.7* 4.6* 4.9* 4.7*  ALBUMIN 2.0* 2.2* 2.1* 2.1* 2.1*   No results for input(s): LIPASE, AMYLASE in the last 168 hours. No results for input(s): AMMONIA in the last 168 hours.  CBC:  Recent Labs Lab 10/19/14 1105 10/21/14 0426 10/22/14 0437 10/23/14 0015 10/24/14 0417  WBC 7.2 6.6 5.1 4.5 4.8  NEUTROABS  --   --   --  2.4  --   HGB 12.0 12.0 10.9* 11.3* 10.9*  HCT 38.7 37.9 34.0* 36.1 34.4*  MCV 102.9* 99.5 100.3* 100.3* 99.7  PLT 72* 99* 104* 134* 137*    Cardiac Enzymes: No results for input(s): CKTOTAL, CKMB, CKMBINDEX, TROPONINI in the last 168 hours. BNP (last 3 results)  Recent Labs  07/19/14 2039 07/28/14 1225 07/29/14 0330  PROBNP 1467.0* 576.5* 753.4*     CBG:  Recent Labs Lab 10/23/14 1742 10/23/14 2208 10/24/14 0313 10/24/14 0607 10/24/14 0758  GLUCAP 266* 124* 121* 97 193*    Recent Results (from  the past 240 hour(s))  Blood Culture (routine x 2)     Status: None   Collection Time: 10/16/14  7:39 AM  Result Value Ref Range Status   Specimen Description BLOOD RIGHT HAND  Final   Special Requests BOTTLES DRAWN AEROBIC AND ANAEROBIC 3ML  Final   Culture   Final    NO GROWTH 5 DAYS Performed at [facility]    Report Status 10/22/2014 FINAL  Final  Blood Culture (routine x 2)     Status: None   Collection Time: 10/16/14  7:55 AM  Result Value Ref Range Status   Specimen Description BLOOD RIGHT ANTECUBITAL  Final   Special Requests BOTTLES DRAWN AEROBIC AND ANAEROBIC 5ML  Final   Culture   Final    NO GROWTH 5 DAYS Performed at [facility]    Report Status 10/22/2014 FINAL  Final  Wound culture     Status: None   Collection Time: 10/16/14  8:25 AM  Result Value Ref Range Status   Specimen Description WOUND VAGINA  Final   Special Requests NONE  Final   Gram Stain   Final    FEW WBC PRESENT,BOTH PMN AND MONONUCLEAR NO SQUAMOUS EPITHELIAL CELLS SEEN RARE GRAM POSITIVE COCCI IN PAIRS IN CLUSTERS RARE GRAM NEGATIVE RODS Performed at [facility]    Culture   Final    FEW YEAST CONSISTENT WITH CANDIDA SPECIES Performed at [facility]    Report Status 10/18/2014 FINAL  Final  Urine culture     Status: None   Collection Time: 10/16/14  8:27 AM  Result Value Ref Range Status   Specimen Description URINE, CATHETERIZED  Final   Special Requests NONE  Final   Colony Count NO GROWTH Performed at [facility]   Final   Culture NO GROWTH Performed at [facility]   Final   Report Status 10/17/2014 FINAL  Final  MRSA PCR Screening     Status: None   Collection Time: 10/16/14  1:30 PM  Result Value Ref Range Status   MRSA by PCR NEGATIVE NEGATIVE Final  Comment:        The GeneXpert MRSA Assay (FDA approved for NASAL specimens only), is one component of a comprehensive MRSA colonization surveillance program. It is  not intended to diagnose MRSA infection nor to guide or monitor treatment for MRSA infections.   Clostridium Difficile by PCR     Status: None   Collection Time: 10/18/14  9:50 AM  Result Value Ref Range Status   C difficile by pcr NEGATIVE NEGATIVE Final    Comment: Performed at [facility]  Clostridium Difficile by PCR     Status: None   Collection Time: 10/22/14 11:49 PM  Result Value Ref Range Status   C difficile by pcr NEGATIVE NEGATIVE Final    Comment: Performed at [facility]  Culture, blood (routine x 2)     Status: None (Preliminary result)   Collection Time: 10/23/14 12:25 PM  Result Value Ref Range Status   Specimen Description BLOOD RIGHT HAND  Final   Special Requests BOTTLES DRAWN AEROBIC AND ANAEROBIC 5ML  Final   Culture   Final           BLOOD CULTURE RECEIVED NO GROWTH TO DATE CULTURE WILL BE HELD FOR 5 DAYS BEFORE ISSUING A FINAL NEGATIVE REPORT Performed at [facility]    Report Status PENDING  Incomplete  Culture, blood (routine x 2)     Status: None (Preliminary result)   Collection Time: 10/23/14 12:25 PM  Result Value Ref Range Status   Specimen Description BLOOD RIGHT HAND  Final   Special Requests BOTTLES DRAWN AEROBIC AND ANAEROBIC 5ML  Final   Culture   Final           BLOOD CULTURE RECEIVED NO GROWTH TO DATE CULTURE WILL BE HELD FOR 5 DAYS BEFORE ISSUING A FINAL NEGATIVE REPORT Performed at [facility]    Report Status PENDING  Incomplete     Studies: Ct Abdomen Pelvis Wo Contrast  10/16/2014   CLINICAL DATA:  Decubitus ulcer  EXAM: CT ABDOMEN AND PELVIS WITHOUT CONTRAST  TECHNIQUE: Multidetector CT imaging of the abdomen and pelvis was performed following the standard protocol without IV contrast.  COMPARISON:  05/21/2012  FINDINGS: BODY WALL:  There is ulceration of the mid gluteal cleft with granulation tissue or other soft tissue extends to contact the sacrum, appearance similar to previous. There are no  sacral changes of osteomyelitis. Fissured appearance of the lower gluteal cleft, extending towards the anus. There is skin thickening and subcutaneous reticulation in the lower left gluteal region which likely also represents pressure changes. No evidence of tract.  Lumpectomy in the lower right breast. Surgical size stable from prior.  LOWER CHEST: There is a moderate sliding-type hiatal hernia. Detection of esophageal or gastric thickening not possible due to under distention and lack of IV contrast.  Bulky mitral annular calcification noted. Chronic subpleural reticulation in the lower lungs.  ABDOMEN/PELVIS:  Liver: Small cysts again noted in the liver.  Biliary: Cholelithiasis. Gallbladder is full but there is no inflammatory change.  Pancreas: Unremarkable.  Spleen: Unremarkable.  Adrenals: Unremarkable.  Kidneys and ureters: 2 small high-density areas within the lower right kidney likely reflects small hemorrhagic cyst. Small low-density foci in the more cranial right renal cortex is likely also cysts.  Bladder: Decompressed by Foley catheter.  Reproductive: Hysterectomy. Decreased size of a cyst in the right hemipelvis, currently 4.8 cm previously 6.4 cm. As noted previously this could be an ovarian or peritoneal cyst.  Bowel:  Sliding hiatal hernia. There is percutaneous gastrostomy tube which is in good position. No pericecal inflammation.  Retroperitoneum: No mass or adenopathy.  Peritoneum: No ascites or pneumoperitoneum.  Vascular: No acute abnormality.  OSSEOUS: Healed insufficiency fractures of the right obturator ring. Diffuse and advanced lumbar degenerative disc disease with levoscoliosis. No acute fracture.  IMPRESSION: 1. Decubitus ulcers, the deepest in the gluteal cleft. No abscess, undermining, or osteomyelitis identified. 2. Chronic findings are stable from 2013 and noted above.   Electronically Signed   By: Jorje Guild M.D.   On: 10/16/2014 22:03   Dg Chest 1 View  09/26/2014    CLINICAL DATA:  Pneumonia.  EXAM: CHEST - 1 VIEW  COMPARISON:  09/14/2014.  02/10/2014.  FINDINGS: Mediastinum and hilar structures normal. Cardiomegaly with normal pulmonary vascularity. No focal pulmonary infiltrate. Chronic interstitial prominence. No pleural effusion or pneumothorax. Degenerative changes thoracic spine. Sliding hiatal hernia.  IMPRESSION: 1. Cardiomegaly.  No overt pulmonary edema. 2. Chronic interstitial lung disease. No focal pulmonary infiltrate.   Electronically Signed   By: Marcello Moores  Register   On: 09/26/2014 11:36   Dg Chest Port 1 View  10/23/2014   CLINICAL DATA:  Breast cancer.  Fever  EXAM: PORTABLE CHEST - 1 VIEW  COMPARISON:  10/15/2014  FINDINGS: Interval development of ill-defined airspace disease bilaterally, right greater than left. This could represent diffuse pneumonitis or interstitial edema. Lungs were clear previously. No pleural effusion.  Apical pleural scarring bilaterally right greater than left. Prominent calcification mitral annulus. Heart size upper normal.  IMPRESSION: Interval development of bilateral airspace disease. This may represent pneumonitis as the patient has a fever. Pulmonary edema also could have this appearance.   Electronically Signed   By: Franchot Gallo M.D.   On: 10/23/2014 09:17   Dg Chest Port 1 View  10/16/2014   CLINICAL DATA:  Shortness of breath and lethargy  EXAM: PORTABLE CHEST - 1 VIEW  COMPARISON:  September 26, 2014  FINDINGS: There is no edema or consolidation. Heart size and pulmonary vascularity are normal. No adenopathy. There is calcification in the mitral annulus. There is degenerative change in the lower thoracic and upper lumbar region with upper lumbar levoscoliosis.  IMPRESSION: No edema or consolidation.   Electronically Signed   By: Lowella Grip M.D.   On: 10/16/2014 08:16    Scheduled Meds: . aztreonam  1 g Intravenous 3 times per day  . collagenase   Topical Daily  . enoxaparin (LOVENOX) injection  50 mg  Subcutaneous Q12H  . feeding supplement (PRO-STAT SUGAR FREE 64)  30 mL Per Tube BID WC  . folic acid  1 mg Per Tube Daily  . free water  200 mL Per Tube Q6H  . insulin aspart  0-15 Units Subcutaneous 5 times per day  . lidocaine  1 application Topical Daily  . metoprolol tartrate  6.25 mg Per Tube BID  . nystatin  5 mL Oral QID  . ondansetron  4 mg Oral Once  . PERATIVE  180 mL Oral 5 X Daily  . potassium chloride  20 mEq Per Tube Daily  . predniSONE  15 mg Per Tube Q breakfast  . ranitidine  150 mg Per Tube QHS  . saccharomyces boulardii  250 mg Oral BID  . vancomycin  500 mg Intravenous Q12H  . warfarin  5 mg Oral ONCE-1800  . Warfarin - Pharmacist Dosing Inpatient   Does not apply q1800   Continuous Infusions: . 0.9 % NaCl with KCl 40  mEq / L 50 mL/hr (10/24/14 8144)    Active Problems:   Sepsis   Severe sepsis   New onset type 2 diabetes mellitus   UTI (lower urinary tract infection)   Sacral wound   Hyperglycemia   Protein-calorie malnutrition, severe   Diabetic hyperosmolar non-ketotic state   Decubitus skin ulcer    Time spent: 40 minutes   [facility] Pager [phone]. If 7PM-7AM, please contact night-coverage at [email or web address], password [facility] 10/24/2014, 10:50 AM  LOS: 8 days

## 2014-10-24 NOTE — H&P (Signed)
Reason for Consult: Extensive Bilateral DVT, poor long term anticoagulation candidate. Chief Complaint: Chief Complaint  Patient presents with  . Fever   Referring Physician(s): [facility]-Dr. Allyson Sabal  History of Present Illness: Martha Mcbride is a 81 y.o. female with multiple medical problems including bilateral DVT and currently on Lovenox, patient and family members are refusing coumadin, they feel that her loose stools are secondary to the medication. IR received request for IVC filter placement given patient is mainly non-ambulatory and not a candidate for long term anticoagulation. I have had an extensive discussion today with the patient and her daughter regarding long term anticoagulation or IVC filter placement for protection of large clot burden migrating to the kidneys or lungs. All parties would like to proceed with IVC filter placement at this time. She denies any sob or chest pain. She denies any known allergies to iodinated contrast or history of kidney dysfunction.   Past Medical History  Diagnosis Date  . Arthritis   . Polymyalgia   . Acid reflux   . Immune deficiency disorder     autoimmune disorder - polymyalgia - on prednisone  . Breast cancer     Treated with lumpectomy and radiation  . Giardia   . Pinworms   . History of hiatal hernia   . Pneumonia   . Dementia   . CHF (congestive heart failure)   . Dysrhythmia     Past Surgical History  Procedure Laterality Date  . Breast lumpectomy    . Partial hysterectomy    . Tonsillectomy    . Tee without cardioversion N/A 07/20/2014    Procedure: TRANSESOPHAGEAL ECHOCARDIOGRAM (TEE);  Surgeon: Candee Furbish, MD;  Location[facility] ENDOSCOPY;  Service: Cardiovascular;  Laterality: N/A;  . Abdominal hysterectomy      Allergies: Doxycycline; Other; Penicillins; and Sulfa antibiotics  Medications: Prior to Admission medications   Medication Sig Start Date End Date Taking? Authorizing Provider  aspirin 81 MG chewable  tablet Place 1 tablet (81 mg total) into feeding tube daily. 08/10/14  Yes Charlynne Cousins, MD  ciprofloxacin (CIPRO) 500 MG tablet Take 500 mg by mouth 2 (two) times daily. For 10 days 10/15/14 10/25/14 Yes Historical Provider, MD  folic acid (FOLVITE) 1 MG tablet Place 1 tablet (1 mg total) into feeding tube daily. 08/10/14  Yes Charlynne Cousins, MD  furosemide (LASIX) 10 MG/ML solution Place 4 mLs (40 mg total) into feeding tube daily. Patient taking differently: Place 20 mg into feeding tube 2 (two) times daily.  09/12/14  Yes Brett Canales, PA-C  hydroxypropyl methylcellulose / hypromellose (ISOPTO TEARS / GONIOVISC) 2.5 % ophthalmic solution Place 1 drop into both eyes daily as needed for dry eyes.    Yes Historical Provider, MD  lidocaine (XYLOCAINE) 2 % jelly Apply 1 application topically as needed (for sores).   Yes Historical Provider, MD  loperamide (IMODIUM) 1 MG/5ML solution Place 10 mLs (2 mg total) into feeding tube 2 (two) times daily. May take 1 additional  dose or decrease as needed 09/05/14  Yes Erick Colace, NP  metoprolol tartrate (LOPRESSOR) 25 mg/10 mL SUSP Place 2.5 mLs (6.25 mg total) into feeding tube 2 (two) times daily. 08/22/14  Yes Lelon Perla, MD  Nutritional Supplements (PERATIVE) LIQD 8oz at 6am, Ladona Mow at 10am, 2pm, 6pm and 10pm via G tube   Yes Historical Provider, MD  potassium chloride 20 MEQ/15ML (10%) SOLN Place 15 mLs (20 mEq total) into feeding tube daily. 08/09/14  Yes  Charlynne Cousins, MD  predniSONE 5 MG/5ML solution Place 40 mLs (40 mg total) into feeding tube daily. Patient taking differently: Place 20 mg into feeding tube daily.  09/10/14  Yes Rigoberto Noel, MD  ranitidine (ZANTAC) 75 MG/5ML syrup Place 10 mLs (150 mg total) into feeding tube at bedtime. 09/05/14  Yes Erick Colace, NP  saccharomyces boulardii (FLORASTOR) 250 MG capsule Take 1 capsule (250 mg total) by mouth 2 (two) times daily. Patient taking differently: 250 mg by Feeding  Tube route 2 (two) times daily.  08/10/14  Yes Charlynne Cousins, MD  Water For Irrigation, Sterile (FREE WATER) SOLN Place 120 mLs into feeding tube 4 (four) times daily. 08/09/14  Yes Charlynne Cousins, MD  acetaminophen (TYLENOL) 160 MG/5ML solution Take 20.3 mLs (650 mg total) by mouth every 6 (six) hours as needed for mild pain. 10/22/14   Reyne Dumas, MD  Amino Acids-Protein Hydrolys (FEEDING SUPPLEMENT, PRO-STAT SUGAR FREE 64,) LIQD Place 30 mLs into feeding tube 2 (two) times daily with a meal. 10/22/14   Reyne Dumas, MD  ciprofloxacin (CIPRO) 500 MG tablet Place 1 tablet (500 mg total) into feeding tube 2 (two) times daily. 10/22/14 10/29/14  Reyne Dumas, MD  clindamycin (CLEOCIN) 75 MG/5ML solution Place 20 mLs (300 mg total) into feeding tube every 8 (eight) hours. 10/22/14 10/29/14  Reyne Dumas, MD  collagenase (SANTYL) ointment Apply topically daily. 10/22/14   Reyne Dumas, MD  enoxaparin (LOVENOX) 60 MG/0.6ML injection Inject 0.5 mLs (50 mg total) into the skin every 12 (twelve) hours. 10/22/14 10/26/14  Reyne Dumas, MD  furosemide (LASIX) 8 MG/ML solution Place 2.5 mLs (20 mg total) into feeding tube 2 (two) times daily. 10/22/14   Reyne Dumas, MD  insulin aspart (NOVOLOG) 100 UNIT/ML injection Correction coverage: Resistant (obese, steroids)     CBG < 70: implement hypoglycemia protocol    CBG 70 - 120: 0 units    CBG 121 - 150: 3 units    CBG 151 - 200: 4 units    CBG 201 - 250: 7 units    CBG 251 - 300: 11 units    CBG 301 - 350: 15 units    CBG 351 - 400: 20 units    CBG > 400 call MD and obtain STAT lab verification 10/22/14   Reyne Dumas, MD  Nutritional Supplements (PERATIVE) LIQD Take 180 mLs by mouth 5 (five) times daily. 10/22/14   Reyne Dumas, MD  nystatin (MYCOSTATIN) 100000 UNIT/ML suspension Take 5 mLs (500,000 Units total) by mouth 4 (four) times daily. 10/22/14   Reyne Dumas, MD  ondansetron (ZOFRAN) 4 MG/5ML solution Take 5 mLs (4 mg total) by  mouth once. 10/22/14   Reyne Dumas, MD  predniSONE (DELTASONE) 20 MG tablet Place 1 tablet (20 mg total) into feeding tube daily. 10/22/14   Reyne Dumas, MD  warfarin (COUMADIN) 5 MG tablet Take 1 tablet (5 mg total) by mouth once. 10/22/14   Reyne Dumas, MD  Water For Irrigation, Sterile (FREE WATER) SOLN Place 200 mLs into feeding tube every 6 (six) hours. 10/22/14   Reyne Dumas, MD    Family History  Problem Relation Age of Onset  . Stroke Mother     History   Social History  . Marital Status: Widowed    Spouse Name: N/A    Number of Children: 4  . Years of Education: N/A   Social History Main Topics  . Smoking status: Never Smoker   . Smokeless  tobacco: Never Used  . Alcohol Use: No  . Drug Use: No  . Sexual Activity: None   Other Topics Concern  . None   Social History Narrative   Review of Systems: A 12 point ROS discussed and pertinent positives are indicated in the HPI above.  All other systems are negative.  Review of Systems  Vital Signs: BP 109/64 mmHg  Pulse 79  Temp(Src) 98.1 F (36.7 C) (Oral)  Resp 16  Ht 5\' 2"  (1.575 m)  Wt 115 lb 1.6 oz (52.209 kg)  BMI 21.05 kg/m2  SpO2 99%  Physical Exam General: A&Ox3, NAD Heart: RRR without M/G/R Lungs: CTA b/l Abd: G-tube intact, soft Ext: LLE 3+ pitting edema, RLE 1+ pitting edema with gangrenous toe changes, sacral wounds  Imaging: Ct Abdomen Pelvis Wo Contrast  10/16/2014   CLINICAL DATA:  Decubitus ulcer  EXAM: CT ABDOMEN AND PELVIS WITHOUT CONTRAST  TECHNIQUE: Multidetector CT imaging of the abdomen and pelvis was performed following the standard protocol without IV contrast.  COMPARISON:  05/21/2012  FINDINGS: BODY WALL:  There is ulceration of the mid gluteal cleft with granulation tissue or other soft tissue extends to contact the sacrum, appearance similar to previous. There are no sacral changes of osteomyelitis. Fissured appearance of the lower gluteal cleft, extending towards the anus. There is  skin thickening and subcutaneous reticulation in the lower left gluteal region which likely also represents pressure changes. No evidence of tract.  Lumpectomy in the lower right breast. Surgical size stable from prior.  LOWER CHEST: There is a moderate sliding-type hiatal hernia. Detection of esophageal or gastric thickening not possible due to under distention and lack of IV contrast.  Bulky mitral annular calcification noted. Chronic subpleural reticulation in the lower lungs.  ABDOMEN/PELVIS:  Liver: Small cysts again noted in the liver.  Biliary: Cholelithiasis. Gallbladder is full but there is no inflammatory change.  Pancreas: Unremarkable.  Spleen: Unremarkable.  Adrenals: Unremarkable.  Kidneys and ureters: 2 small high-density areas within the lower right kidney likely reflects small hemorrhagic cyst. Small low-density foci in the more cranial right renal cortex is likely also cysts.  Bladder: Decompressed by Foley catheter.  Reproductive: Hysterectomy. Decreased size of a cyst in the right hemipelvis, currently 4.8 cm previously 6.4 cm. As noted previously this could be an ovarian or peritoneal cyst.  Bowel: Sliding hiatal hernia. There is percutaneous gastrostomy tube which is in good position. No pericecal inflammation.  Retroperitoneum: No mass or adenopathy.  Peritoneum: No ascites or pneumoperitoneum.  Vascular: No acute abnormality.  OSSEOUS: Healed insufficiency fractures of the right obturator ring. Diffuse and advanced lumbar degenerative disc disease with levoscoliosis. No acute fracture.  IMPRESSION: 1. Decubitus ulcers, the deepest in the gluteal cleft. No abscess, undermining, or osteomyelitis identified. 2. Chronic findings are stable from 2013 and noted above.   Electronically Signed   By: Jorje Guild M.D.   On: 10/16/2014 22:03   Dg Chest 1 View  09/26/2014   CLINICAL DATA:  Pneumonia.  EXAM: CHEST - 1 VIEW  COMPARISON:  09/14/2014.  02/10/2014.  FINDINGS: Mediastinum and hilar  structures normal. Cardiomegaly with normal pulmonary vascularity. No focal pulmonary infiltrate. Chronic interstitial prominence. No pleural effusion or pneumothorax. Degenerative changes thoracic spine. Sliding hiatal hernia.  IMPRESSION: 1. Cardiomegaly.  No overt pulmonary edema. 2. Chronic interstitial lung disease. No focal pulmonary infiltrate.   Electronically Signed   By: Marcello Moores  Register   On: 09/26/2014 11:36   Dg Chest Port 1 [address]  10/23/2014   CLINICAL DATA:  Breast cancer.  Fever  EXAM: PORTABLE CHEST - 1 VIEW  COMPARISON:  10/15/2014  FINDINGS: Interval development of ill-defined airspace disease bilaterally, right greater than left. This could represent diffuse pneumonitis or interstitial edema. Lungs were clear previously. No pleural effusion.  Apical pleural scarring bilaterally right greater than left. Prominent calcification mitral annulus. Heart size upper normal.  IMPRESSION: Interval development of bilateral airspace disease. This may represent pneumonitis as the patient has a fever. Pulmonary edema also could have this appearance.   Electronically Signed   By: Franchot Gallo M.D.   On: 10/23/2014 09:17   Dg Chest Port 1 View  10/16/2014   CLINICAL DATA:  Shortness of breath and lethargy  EXAM: PORTABLE CHEST - 1 VIEW  COMPARISON:  September 26, 2014  FINDINGS: There is no edema or consolidation. Heart size and pulmonary vascularity are normal. No adenopathy. There is calcification in the mitral annulus. There is degenerative change in the lower thoracic and upper lumbar region with upper lumbar levoscoliosis.  IMPRESSION: No edema or consolidation.   Electronically Signed   By: Lowella Grip M.D.   On: 10/16/2014 08:16    Labs:  CBC:  Recent Labs  10/21/14 0426 10/22/14 0437 10/23/14 0015 10/24/14 0417  WBC 6.6 5.1 4.5 4.8  HGB 12.0 10.9* 11.3* 10.9*  HCT 37.9 34.0* 36.1 34.4*  PLT 99* 104* 134* 137*    COAGS:  Recent Labs  09/03/14 1920 09/03/14 2200  09/04/14 0321 10/21/14 1221 10/22/14 0437 10/23/14 0450 10/24/14 0417  INR 1.08  --   --  1.04 0.98 1.10 1.03  APTT 36 63* 69*  --   --   --   --     BMP:  Recent Labs  10/20/14 0653 10/22/14 0437 10/23/14 0015 10/24/14 0417  NA 142 139 142 139  K 3.8 3.6 3.2* 4.2  CL 115* 112 110 112  CO2 21 22 24 22   GLUCOSE 179* 81 113* 117*  BUN 15 17 19 17   CALCIUM 7.1* 7.3* 7.6* 7.6*  CREATININE 0.54 0.41* 0.48* 0.46*  GFRNONAA 82* >90 86* 87*  GFRAA >90 >90 >90 >90    LIVER FUNCTION TESTS:  Recent Labs  10/20/14 0653 10/22/14 0437 10/23/14 0015 10/24/14 0417  BILITOT 0.4 0.4 0.4 0.7  AST 24 21 25 23   ALT 18 15 20 19   ALKPHOS 110 101 110 104  PROT 4.7* 4.6* 4.9* 4.7*  ALBUMIN 2.2* 2.1* 2.1* 2.1*   Assessment and Plan: Bilateral DVT Non-ambulatory and poor long term anticoagulation candidate Gangrenous right digits Sacral wounds/UTI/possible HCAP on antibiotics Dementia History of breast cancer AS CHF, chronic diastolic Request for image guided IVC filter placement  Risks and Benefits discussed with the patient and her daughter today. All of the patient's questions were answered, patient is agreeable to proceed. Consent signed and in chart.     Thank you for this interesting consult.  I greatly enjoyed meeting Martha Mcbride and look forward to participating in their care.  SignedHedy Jacob 10/24/2014, 11:45 AM   I spent a total of 20 minutes face to face in clinical consultation, greater than 50% of which was counseling/coordinating care

## 2014-10-24 NOTE — Progress Notes (Signed)
Daughter [name] stated that she and [name] feel that she needs to be seen by the wound nurse again because the current regimen isn't helping.  Spoke with [facility] and she stated she was covering [facility] for Margarita Grizzle last week and to page Margarita Grizzle for follow up.  Dawn shared that she and [facility] had evaluated the wound and did not see any other options for treatment at this time besides the current course.  She stated if another opinion was requested that Margarita Grizzle could provide that for [place].  Paged Margarita Grizzle with no answer to page - message left in her office voicemail.  Information relayed to daughter [name].

## 2014-10-24 NOTE — Progress Notes (Signed)
OT Cancellation Note  Patient Details Name: PATRECIA VEIGA MRN: [id number] DOB: April 15, 1925   Cancelled Treatment:    Reason Eval/Treat Not Completed: Other (comment).  Noted PT's note--will check back tomorrow.  Sharetta Ricchio 10/24/2014, 2:12 PM  Lesle Chris, OTR/L [phone] 10/24/2014

## 2014-10-24 NOTE — Progress Notes (Signed)
ANTICOAGULATION CONSULT NOTE - Follow-up Consult  Pharmacy Consult for Lovenox / Warfarin  Indication: DVT  Allergies  Allergen Reactions  . Doxycycline Nausea And Vomiting  . Other Nausea And Vomiting and Other (See Comments)    Anesthesia makes her very sleepy and makes it hard for her to come out of it.   Marland Kitchen Penicillins Hives and Nausea And Vomiting  . Sulfa Antibiotics Nausea And Vomiting   Patient Measurements: Height: 5\' 2"  (157.5 cm) Weight: 115 lb 1.6 oz (52.209 kg) IBW/kg (Calculated) : 50.1  Vital Signs: Temp: 98.1 F (36.7 C) (01/27 0704) Temp Source: Oral (01/27 0704) BP: 109/64 mmHg (01/27 0704) Pulse Rate: 79 (01/27 0704)  Labs:  Recent Labs  10/22/14 0437 10/23/14 0015 10/23/14 0450 10/24/14 0417  HGB 10.9* 11.3*  --  10.9*  HCT 34.0* 36.1  --  34.4*  PLT 104* 134*  --  137*  LABPROT 13.1  --  14.3 13.6  INR 0.98  --  1.10 1.03  CREATININE 0.41* 0.48*  --  0.46*   Estimated Creatinine Clearance: 39.2 mL/min (by C-G formula based on Cr of 0.46).  Medications:  Scheduled:  . aztreonam  1 g Intravenous 3 times per day  . collagenase   Topical Daily  . enoxaparin (LOVENOX) injection  50 mg Subcutaneous Q12H  . feeding supplement (PRO-STAT SUGAR FREE 64)  30 mL Per Tube BID WC  . folic acid  1 mg Per Tube Daily  . free water  200 mL Per Tube Q6H  . insulin aspart  0-15 Units Subcutaneous 5 times per day  . lidocaine  1 application Topical Daily  . metoprolol tartrate  6.25 mg Per Tube BID  . nystatin  5 mL Oral QID  . ondansetron  4 mg Oral Once  . PERATIVE  180 mL Oral 5 X Daily  . potassium chloride  20 mEq Per Tube Daily  . predniSONE  15 mg Per Tube Q breakfast  . ranitidine  150 mg Per Tube QHS  . saccharomyces boulardii  250 mg Oral BID  . vancomycin  500 mg Intravenous Q12H  . warfarin  5 mg Oral ONCE-1800  . Warfarin - Pharmacist Dosing Inpatient   Does not apply q1800   Assessment: 89yoF admitted 1/19 for sepsis from wound infection  vs UTI.  Now found to have new bilateral DVT.  Noted with thrombocytopenia, possibly from sepsis vs ranitidine.  Suspected HIT during last admission, but HIT panel returned negative.  PMH: CHF, ILD, hiatal hernia s/p PEG tube placement, dementia.  Changed from prophylactic lovenox to treatment dose Lovenox on 1/23 pm, Pharmacy consulted to start warfarin on 1/24.   *Require 5 day overlap of Lovenox/Warfarin to include 48 hr of the overlap with therapeutic INR*  1/27: INR remains SUBtherapeutic and patient is now refusing warfarin doses CBC: Thrombocytopenia from sepsis, plts continue to improve No bleeding issues noted Moderate renal impairment; low SCr (chair/bed bound at baseline) Note also on ASA 81, and was on Ciprofloxacin (discontinued 1/26) may cause elevations in INR. Diarrhea resolving, continues on Perative tube feed 5x/day  Goal of Therapy:  Anti-Xa level 0.6-1 units/ml 4hrs after LMWH dose given Monitor platelets by anticoagulation protocol: Yes  INR 2 - 3   Plan:   Continue Lovenox 50 mg SQ q12h  Could change Lovenox dosing to 75mg  SQ q24 hr if desired  NO more warfarin per patient's request  CBC, SCr at least q72 hrs while on Lovenox  Discontinue daily PT/INR,  discontinue warfarin consult  F/u renal function, resolution of DVT symptoms, and for signs of bleeding  Noted patient for IVC filter placement today, follow-up plans for continued pharmacologic anticoagulation  Thank you for the consult.  Currie Paris, PharmD, BCPS Pager: [phone] Pharmacy: [phone] 10/24/2014 3:13 PM

## 2014-10-24 NOTE — Consult Note (Signed)
WOC wound follow up Wound type:reconsult.  Last WOC visit was 1 week ago.  There is no change in the condition of the patient's wounds and daughter would like to have [place] Nurse revisit.  The patient and her family are known to me from previous admissions and I am happy to see again them again today. Measurement:Left upper buttock, full thickness wound:  2cm x 2cm x 0.2cm with 50% yellow and 50% red base.  Scant amount of light yellow exudate on cotton tipped applicator, no odor.  Left outer buttock, full thickness wound:  4cm x 2cm x 0.2cm with 50% yellow and 50% red base.  Scant amount of light yellow exudate on cotton tipped applicator, no odor.  Large pressure ulcer, Stage III (full thickness) measuring 7cm x 3cm x 1cm and located in the gluteal cleft and extending toward the vagina is unchanged.  Moderate amount of light yellow exudate on cotton tipped applicator.  No odor. Area is tender to touch. Tips of two toes on right foot are unchanged.Family is painting these toes daily with betadine and allowing them to air dry under the direction of Dr. Oneida Alar.  They report that they had an appointment to see Dr. Oneida Alar this week but had to cancel due to the patient's continued hospitalization. Wound bed:As described above. Drainage (amount, consistency, odor) As described above. Periwound: Intact with erythema, no induration, no warmth. Dressing procedure/placement/frequency: The family is interested in discontinuing the collagenase (Santyl) as there has been no improvement in the wound status since the initiation of that enzymatic debriding ointment therapy.  They would like to use silver sulfadiazine and have a 1 pound tub in the room this evening that they have secured from their local pharmacy.  I have no objection to the use of this topical antimicrobial agent for wound care and we will implement this today. Patient has a noted allergy for oral sulfa drugs and presents with a complaint of nausea and/or  vomiting with ingestion, but I see no contraindications to the use of a topical sulfa containing cream and have reviewed this with the pharmacist on staff prior to ordering tonight.  Patient's family would like to continue to use the ENDIT cream (zinc oxide, calamine, camphor are the primary and active ingredients) on all erythematous areas.  Orders provided today for wound care, collagenase is discontinued and silver sulfadiazine is ordered. Guidance provided for technique. Patient is on a therapeutic mattress with low air loss feature, the staff is using air flow underpads, they are using paper towels for cleansing the perineum following frequent stools or urinary leakage around the catheter and periwound areas (versus terrycloth washcloths or gauze) and are turning and repositioning the patient per protocol.  [place] nursing team will not follow routinely, but will remain available to this patient, the nursing, surgical and medical teams.  Please re-consult if needed. Thanks, Maudie Flakes, MSN, RN, [place], [place], [place] [phone])  Length of visit = 75 minutes

## 2014-10-24 NOTE — Progress Notes (Signed)
PT Cancellation Note  Patient Details Name: Martha Mcbride MRN: [id number] DOB: 07-23-1925   Cancelled Treatment:     cancel due to procedure:  IR IVC filter placement.                                             Family requested we come back tomorrow.     Rica Koyanagi  PTA [facility]  Acute  Rehab Pager      [phone]

## 2014-10-25 ENCOUNTER — Inpatient Hospital Stay (HOSPITAL_COMMUNITY): Payer: Medicare Other

## 2014-10-25 ENCOUNTER — Ambulatory Visit: Payer: Medicare Other | Admitting: Vascular Surgery

## 2014-10-25 LAB — COMPREHENSIVE METABOLIC PANEL
ALT: 20 U/L (ref 0–35)
AST: 30 U/L (ref 0–37)
Albumin: 2.1 g/dL — ABNORMAL LOW (ref 3.5–5.2)
Alkaline Phosphatase: 114 U/L (ref 39–117)
Anion gap: 7 (ref 5–15)
BUN: 12 mg/dL (ref 6–23)
CALCIUM: 7.9 mg/dL — AB (ref 8.4–10.5)
CHLORIDE: 110 mmol/L (ref 96–112)
CO2: 21 mmol/L (ref 19–32)
Creatinine, Ser: 0.38 mg/dL — ABNORMAL LOW (ref 0.50–1.10)
GFR calc Af Amer: >90 mL/min (ref >90)
GFR calc non Af Amer: >90 mL/min (ref >90)
Glucose, Bld: 113 mg/dL — ABNORMAL HIGH (ref 70–99)
Potassium: 4.1 mmol/L (ref 3.5–5.1)
SODIUM: 138 mmol/L (ref 135–145)
Total Bilirubin: 0.7 mg/dL (ref 0.3–1.2)
Total Protein: 4.8 g/dL — ABNORMAL LOW (ref 6.0–8.3)

## 2014-10-25 LAB — CBC
HCT: 34.9 % — ABNORMAL LOW (ref 36.0–46.0)
HEMOGLOBIN: 11.1 g/dL — AB (ref 12.0–15.0)
MCH: 31.6 pg (ref 26.0–34.0)
MCHC: 31.8 g/dL (ref 30.0–36.0)
MCV: 99.4 fL (ref 78.0–100.0)
Platelets: 163 10*3/uL (ref 150–400)
RBC: 3.51 MIL/uL — ABNORMAL LOW (ref 3.87–5.11)
RDW: 16.8 % — AB (ref 11.5–15.5)
WBC: 5.1 10*3/uL (ref 4.0–10.5)

## 2014-10-25 LAB — GLUCOSE, CAPILLARY
GLUCOSE-CAPILLARY: 177 mg/dL — AB (ref 70–99)
Glucose-Capillary: 112 mg/dL — ABNORMAL HIGH (ref 70–99)

## 2014-10-25 LAB — MAGNESIUM: Magnesium: 1.9 mg/dL (ref 1.5–2.5)

## 2014-10-25 MED ORDER — MIDAZOLAM HCL 2 MG/2ML IJ SOLN
INTRAMUSCULAR | Status: AC
  Filled 2014-10-25: qty 6

## 2014-10-25 MED ORDER — LIDOCAINE HCL 1 % IJ SOLN
INTRAMUSCULAR | Status: AC
  Filled 2014-10-25: qty 20

## 2014-10-25 MED ORDER — MIDAZOLAM HCL 2 MG/2ML IJ SOLN
INTRAMUSCULAR | Status: AC | PRN
  Administered 2014-10-25 (×2): 0.5 mg via INTRAVENOUS

## 2014-10-25 MED ORDER — FENTANYL CITRATE 0.05 MG/ML IJ SOLN
INTRAMUSCULAR | Status: AC
  Filled 2014-10-25: qty 4

## 2014-10-25 MED ORDER — FENTANYL CITRATE 0.05 MG/ML IJ SOLN
INTRAMUSCULAR | Status: AC | PRN
Start: 2014-10-25 — End: 2014-10-25
  Administered 2014-10-25: 25 ug via INTRAVENOUS
  Administered 2014-10-25: 50 ug via INTRAVENOUS

## 2014-10-25 MED ORDER — IOHEXOL 300 MG/ML  SOLN
50.0000 mL | Freq: Once | INTRAMUSCULAR | Status: AC | PRN
  Administered 2014-10-25: 50 mL

## 2014-10-25 NOTE — Progress Notes (Signed)
OT Cancellation Note  Patient Details Name: Martha Mcbride MRN: [id number] DOB: 12-Feb-1925   Cancelled Treatment:    Reason Eval/Treat Not Completed: Medical issues which prohibited therapy. Pt on bed rest for 2 hours. Will re attempt as schedule allows or will see pt next day  Malynn Lucy, Thereasa Parkin 10/25/2014, 11:02 AM

## 2014-10-25 NOTE — Progress Notes (Addendum)
[facility] PROGRESS NOTE  Martha Mcbride MPN:[id number] DOB: 11/13/1924 DOA: 10/16/2014 PCP: Kandice Hams, MD  Assessment/Plan: Active Problems:   Sepsis   Severe sepsis   New onset type 2 diabetes mellitus   UTI (lower urinary tract infection)   Sacral wound   Hyperglycemia   Protein-calorie malnutrition, severe   Diabetic hyperosmolar non-ketotic state   Decubitus skin ulcer   DVT (deep venous thrombosis)     Patient tx to [facility] 10/19/14   HOSPITAL COURSE:   Sacral decubitus ulcers/UTI/possible HCAP (1/26) Continues to ooze blood from wounds , Family would like to use silver sulfadiazine ,santyl discontinued   Continue vancomycin and aztreonam-stop 1/29 BCx2 1/19 and 1/26 >> NGSF  UC 1/19 >> NGSF C-Diff 1/19 >> negative *2 Norovirus 1/19 >> pending since 1/19 Will need hyperbaric therapy at the wound care center once the patient is discharged. Continue florastor  CT negative for abscess. 1/19 Consider hyperbaric Rx as an outpatient , patient has seen a plastic surgeon in [place] and should also follow-up with them    Bilateral DVTLovenox therapeutic dose started 1/23 , family refused Coumadin  S/p IVC filter , discontinued lovenox after IVC filter They are of the opinion that  patient's diarrhea is  Caused by  Coumadin Family not interested in long-term anticoagulation at this point Told family they would need to consider longterm anticoagulation for patients  IVC filter and it would need to be lifelong     interstitial lung disease/aspiration PNA  Continue Aspiration precautions, pulmonary hygiene continue tube feedings Continue checking post tube feeding residuals Continue prednisone  15 mg a day,     Thrush-improved Better with nystatin swish and spit     Hypertension,AS - moderate on TEE 07/20/14 & calcified MV Continue Lopressor, continue low-dose Lasix   Hypernatremia, hypokalemia , acute kidney injury-secondary to dehydration All  of the above has resolved, continue low-dose maintenance hydration Continue free water through feeding tube Continue Foley catheter given perineal ulcers     Hyperglycemia,DM 2  No more early morning hypoglycemia Continue SSI with tube feeding boluses, on  moderate sliding scale    Severe Dysphagia - s/p PEG, NPO at baseline (since late fall 2015). In setting of large hiatal hernia. They have refused MBS  Continue home PEG tube feeding, nutrition following  Because of diarrhea, tube feeding  bolus changed to 180 mL from 240 mL bolus Will change when family requests to do so     Thrombocytopenia likely secondary to sepsis Follow CBC. Thrombocytopenia is improving , platelet count 137   Code Status: full Family Communication: family updated about patient's clinical progress, spent 30 mins discussing with daughter [name] in the room Disposition Plan: possibly 1/29   Brief narrative: 81 y/o F with a past medical history of breast cancer (remote), dementia, chronic diastolic CHF (baseline Lasix 20 mg BID), aortic stenosis and severely calcified mitral valve with moderate stenosis, gangrenous toes on the right foot (followed by Dr. Oneida Alar), polymyalgia rheumatica on prednisone (20 mg QD), interstitial lung disease / presumed IPF, large hiatal hernia, severe dysphasia status post PEG tube placement, multiple admissions for aspiration pneumonia, chronic diarrhea, who is chair/bed bound at baseline (followed at the wound clinic at [facility]). She is cared for at home by her daughters [name] and [name].   Chart review notes that they call the cardiology office on 1/17 with concerns for tachycardia and lethargy. The daughters medicated the patient with an additional dose of Lopressor. They returned to the  cardiology office for an acute visit on 1/18 with a three-day history of progressive weakness and fatigue. Notes reflect that a urinalysis was obtained and the family was encouraged to send  the patient to the hospital for evaluation. She was treated with Cipro for UTI. However, they obtained a urinalysis and then proceeded to leave the office. They called the office to let them know that they were taking her home. Attempts were made to contact her via phone & messages were left at that time.   On 1/19 the patient presented to the [facility] emergency room via EMS with progressive confusion, lethargy and tachypnea. Initial BP 118/65, rate 133, rectal temperature of 101.2, RR 31 and SPO2 94%. Initial lab work notable for hgb 15.5, Na 151, CL 1:15, BUN 54, sr cr on 0.17, albumin 2.9, glucose 735 and lactic acid of 6.69. Repeat urinalysis on 1/19 with glucose greater than 1000, negative nitrite, negative leukocytes, 3-6 WBC, and few bacteria. CXR was negative for acute infiltrate. She was started on an insulin drip in the emergency room and treated with IV abx for potential wound infection/UTI. Of note, her caretakers have apparently had something similar to a viral gastroenteritis. PCCM consulted for ICU admission.  Consultants:  Critical care  Procedures:  None   Antiinfectives  1/19 >>aztreonam >> 1/22 1/19 >>vancomycin >> 1/22 1/19 >>metronidazole >> 1/22 1/19 >>levofloxacin >> 1/19 1/22 >> ciprofloxacin >> 1/22 > clindamycin >> vanc and aztreonam 1/26-1/29  HPI/Subjective: minimal pain around her wounds, replaced leaking foley , patient still has some diarrhea despite stopping coumadin  Objective: Filed Vitals:   10/25/14 1206 10/25/14 1239 10/25/14 1300 10/25/14 1330  BP: 124/58 102/53 105/54 113/58  Pulse: 79 83 85 85  Temp: 97.8 F (36.6 C) 97.7 F (36.5 C) 98 F (36.7 C) 97.6 F (36.4 C)  TempSrc: Oral Oral Oral Oral  Resp: 20 22 20 18   Height:      Weight:      SpO2: 100% 100% 100% 100%    Intake/Output Summary (Last 24 hours) at 10/25/14 1646 Last data filed at 10/24/14 2044  Gross per 24 hour  Intake    377 ml  Output    500 ml  Net   -123  ml    Exam: General: Frail elderly female, awake/pleasant , dehydration Neuro: Awake/alert, oriented to self, place & events, MAE, generalized weakness  HEENT: Mm pink/dry, no jvd, dentures in place  Cardiovascular: s1s2 rrr, 3/6 SEM Lungs: Even/non-labored, lungs bilaterally with fine basilar crackles Abdomen: PEG c/d/i, NTND Musculoskeletal: Left lower extremity erythema stable  Skin: Warm/dry, good cap refills, 1+ pitting edema in BLE, sacral/rectal wound erythematous, ~covered by dressing       Data Reviewed: Basic Metabolic Panel:  Recent Labs Lab 10/19/14 1100  10/20/14 0653 10/22/14 0437 10/23/14 0015 10/23/14 0456 10/24/14 0417 10/25/14 0445  NA  --   < > 142 139 142  --  139 138  K  --   < > 3.8 3.6 3.2*  --  4.2 4.1  CL  --   < > 115* 112 110  --  112 110  CO2  --   < > 21 22 24   --  22 21  GLUCOSE  --   < > 179* 81 113*  --  117* 113*  BUN  --   < > 15 17 19   --  17 12  CREATININE  --   < > 0.54 0.41* 0.48*  --  0.46*  0.38*  CALCIUM  --   < > 7.1* 7.3* 7.6*  --  7.6* 7.9*  MG 2.0  --   --   --  2.0 2.0  --  1.9  < > = values in this interval not displayed.  Liver Function Tests:  Recent Labs Lab 10/20/14 0653 10/22/14 0437 10/23/14 0015 10/24/14 0417 10/25/14 0445  AST 24 21 25 23 30   ALT 18 15 20 19 20   ALKPHOS 110 101 110 104 114  BILITOT 0.4 0.4 0.4 0.7 0.7  PROT 4.7* 4.6* 4.9* 4.7* 4.8*  ALBUMIN 2.2* 2.1* 2.1* 2.1* 2.1*   No results for input(s): LIPASE, AMYLASE in the last 168 hours. No results for input(s): AMMONIA in the last 168 hours.  CBC:  Recent Labs Lab 10/21/14 0426 10/22/14 0437 10/23/14 0015 10/24/14 0417 10/25/14 0445  WBC 6.6 5.1 4.5 4.8 5.1  NEUTROABS  --   --  2.4  --   --   HGB 12.0 10.9* 11.3* 10.9* 11.1*  HCT 37.9 34.0* 36.1 34.4* 34.9*  MCV 99.5 100.3* 100.3* 99.7 99.4  PLT 99* 104* 134* 137* 163    Cardiac Enzymes: No results for input(s): CKTOTAL, CKMB, CKMBINDEX, TROPONINI in the last 168  hours. BNP (last 3 results)  Recent Labs  07/19/14 2039 07/28/14 1225 07/29/14 0330  PROBNP 1467.0* 576.5* 753.4*     CBG:  Recent Labs Lab 10/24/14 0607 10/24/14 0758 10/24/14 1825 10/24/14 2159 10/25/14 0618  GLUCAP 97 193* 187* 108* 112*    Recent Results (from the past 240 hour(s))  Blood Culture (routine x 2)     Status: None   Collection Time: 10/16/14  7:39 AM  Result Value Ref Range Status   Specimen Description BLOOD RIGHT HAND  Final   Special Requests BOTTLES DRAWN AEROBIC AND ANAEROBIC 3ML  Final   Culture   Final    NO GROWTH 5 DAYS Performed at [facility]    Report Status 10/22/2014 FINAL  Final  Blood Culture (routine x 2)     Status: None   Collection Time: 10/16/14  7:55 AM  Result Value Ref Range Status   Specimen Description BLOOD RIGHT ANTECUBITAL  Final   Special Requests BOTTLES DRAWN AEROBIC AND ANAEROBIC 5ML  Final   Culture   Final    NO GROWTH 5 DAYS Performed at [facility]    Report Status 10/22/2014 FINAL  Final  Wound culture     Status: None   Collection Time: 10/16/14  8:25 AM  Result Value Ref Range Status   Specimen Description WOUND VAGINA  Final   Special Requests NONE  Final   Gram Stain   Final    FEW WBC PRESENT,BOTH PMN AND MONONUCLEAR NO SQUAMOUS EPITHELIAL CELLS SEEN RARE GRAM POSITIVE COCCI IN PAIRS IN CLUSTERS RARE GRAM NEGATIVE RODS Performed at [facility]    Culture   Final    FEW YEAST CONSISTENT WITH CANDIDA SPECIES Performed at [facility]    Report Status 10/18/2014 FINAL  Final  Urine culture     Status: None   Collection Time: 10/16/14  8:27 AM  Result Value Ref Range Status   Specimen Description URINE, CATHETERIZED  Final   Special Requests NONE  Final   Colony Count NO GROWTH Performed at [facility]   Final   Culture NO GROWTH Performed at [facility]   Final   Report Status 10/17/2014 FINAL  Final  MRSA PCR Screening  Status:  None   Collection Time: 10/16/14  1:30 PM  Result Value Ref Range Status   MRSA by PCR NEGATIVE NEGATIVE Final    Comment:        The GeneXpert MRSA Assay (FDA approved for NASAL specimens only), is one component of a comprehensive MRSA colonization surveillance program. It is not intended to diagnose MRSA infection nor to guide or monitor treatment for MRSA infections.   Clostridium Difficile by PCR     Status: None   Collection Time: 10/18/14  9:50 AM  Result Value Ref Range Status   C difficile by pcr NEGATIVE NEGATIVE Final    Comment: Performed at [facility]  Clostridium Difficile by PCR     Status: None   Collection Time: 10/22/14 11:49 PM  Result Value Ref Range Status   C difficile by pcr NEGATIVE NEGATIVE Final    Comment: Performed at [facility]  Culture, blood (routine x 2)     Status: None (Preliminary result)   Collection Time: 10/23/14 12:25 PM  Result Value Ref Range Status   Specimen Description BLOOD RIGHT HAND  Final   Special Requests BOTTLES DRAWN AEROBIC AND ANAEROBIC 5ML  Final   Culture   Final           BLOOD CULTURE RECEIVED NO GROWTH TO DATE CULTURE WILL BE HELD FOR 5 DAYS BEFORE ISSUING A FINAL NEGATIVE REPORT Performed at [facility]    Report Status PENDING  Incomplete  Culture, blood (routine x 2)     Status: None (Preliminary result)   Collection Time: 10/23/14 12:25 PM  Result Value Ref Range Status   Specimen Description BLOOD RIGHT HAND  Final   Special Requests BOTTLES DRAWN AEROBIC AND ANAEROBIC 5ML  Final   Culture   Final           BLOOD CULTURE RECEIVED NO GROWTH TO DATE CULTURE WILL BE HELD FOR 5 DAYS BEFORE ISSUING A FINAL NEGATIVE REPORT Performed at [facility]    Report Status PENDING  Incomplete     Studies: Ct Abdomen Pelvis Wo Contrast  10/16/2014   CLINICAL DATA:  Decubitus ulcer  EXAM: CT ABDOMEN AND PELVIS WITHOUT CONTRAST  TECHNIQUE: Multidetector CT imaging of the abdomen and  pelvis was performed following the standard protocol without IV contrast.  COMPARISON:  05/21/2012  FINDINGS: BODY WALL:  There is ulceration of the mid gluteal cleft with granulation tissue or other soft tissue extends to contact the sacrum, appearance similar to previous. There are no sacral changes of osteomyelitis. Fissured appearance of the lower gluteal cleft, extending towards the anus. There is skin thickening and subcutaneous reticulation in the lower left gluteal region which likely also represents pressure changes. No evidence of tract.  Lumpectomy in the lower right breast. Surgical size stable from prior.  LOWER CHEST: There is a moderate sliding-type hiatal hernia. Detection of esophageal or gastric thickening not possible due to under distention and lack of IV contrast.  Bulky mitral annular calcification noted. Chronic subpleural reticulation in the lower lungs.  ABDOMEN/PELVIS:  Liver: Small cysts again noted in the liver.  Biliary: Cholelithiasis. Gallbladder is full but there is no inflammatory change.  Pancreas: Unremarkable.  Spleen: Unremarkable.  Adrenals: Unremarkable.  Kidneys and ureters: 2 small high-density areas within the lower right kidney likely reflects small hemorrhagic cyst. Small low-density foci in the more cranial right renal cortex is likely also cysts.  Bladder: Decompressed by Foley catheter.  Reproductive: Hysterectomy. Decreased  size of a cyst in the right hemipelvis, currently 4.8 cm previously 6.4 cm. As noted previously this could be an ovarian or peritoneal cyst.  Bowel: Sliding hiatal hernia. There is percutaneous gastrostomy tube which is in good position. No pericecal inflammation.  Retroperitoneum: No mass or adenopathy.  Peritoneum: No ascites or pneumoperitoneum.  Vascular: No acute abnormality.  OSSEOUS: Healed insufficiency fractures of the right obturator ring. Diffuse and advanced lumbar degenerative disc disease with levoscoliosis. No acute fracture.   IMPRESSION: 1. Decubitus ulcers, the deepest in the gluteal cleft. No abscess, undermining, or osteomyelitis identified. 2. Chronic findings are stable from 2013 and noted above.   Electronically Signed   By: Jorje Guild M.D.   On: 10/16/2014 22:03   Dg Chest 1 View  09/26/2014   CLINICAL DATA:  Pneumonia.  EXAM: CHEST - 1 VIEW  COMPARISON:  09/14/2014.  02/10/2014.  FINDINGS: Mediastinum and hilar structures normal. Cardiomegaly with normal pulmonary vascularity. No focal pulmonary infiltrate. Chronic interstitial prominence. No pleural effusion or pneumothorax. Degenerative changes thoracic spine. Sliding hiatal hernia.  IMPRESSION: 1. Cardiomegaly.  No overt pulmonary edema. 2. Chronic interstitial lung disease. No focal pulmonary infiltrate.   Electronically Signed   By: Marcello Moores  Register   On: 09/26/2014 11:36   Ir Ivc Filter Plmt / S&i /img Guid/mod Sed  10/25/2014   CLINICAL DATA:  Bilateral lower extremity deep venous thrombosis and poor candidate for long-term anti coagulation. Request has been made to place a permanent IVC filter.  EXAM: 1. ULTRASOUND GUIDANCE FOR VASCULAR ACCESS OF THE RIGHT INTERNAL JUGULAR VEIN. 2. IVC VENOGRAM. 3. PERCUTANEOUS IVC FILTER PLACEMENT.  ANESTHESIA/SEDATION: 1.0 mg IV Versed; 75 mcg IV Fentanyl.  Total Moderate Sedation Time  28minutes.  CONTRAST:  30 mL Omnipaque 300  FLUOROSCOPY TIME:  48 seconds.  PROCEDURE: The procedure, risks, benefits, and alternatives were explained to the patient. Questions regarding the procedure were encouraged and answered. The patient understands and consents to the procedure.  The right neck was prepped with chlorhexidine in a sterile fashion, and a sterile drape was applied covering the operative field. A sterile gown and sterile gloves were used for the procedure. Local anesthesia was provided with 1% Lidocaine. A time-out was performed prior to the procedure.  Ultrasound was used to confirm patency of the right internal jugular  vein. Under direct ultrasound guidance, a 21 gauge needle was advanced into the right internal jugular vein with ultrasound image documentation performed. After securing access with a micropuncture dilator, a guidewire was advanced into the inferior vena cava. A deployment sheath was advanced over the guidewire. This was utilized to perform IVC venography.  The deployment sheath was further positioned in an appropriate location for filter deployment. A Bard Denali IVC filter was then advanced in the sheath. This was then fully deployed in the infrarenal IVC. Final filter position was confirmed with a fluoroscopic spot image. Contrast injection was also performed through the sheath under fluoroscopy to confirm patency of the IVC at the level of the filter. After the procedure the sheath was removed and hemostasis obtained with manual compression.  COMPLICATIONS: None.  FINDINGS: IVC venography demonstrates a normal caliber IVC with no evidence of thrombus. Renal veins are identified bilaterally. The IVC filter was successfully positioned below the level of the renal veins and is appropriately oriented. This IVC filter has both permanent and retrievable indications.  IMPRESSION: Placement of percutaneous IVC filter in infrarenal IVC. IVC venogram shows no evidence of IVC thrombus and normal caliber  of the inferior vena cava.   Electronically Signed   By: Aletta Edouard M.D.   On: 10/25/2014 11:48   Dg Chest Port 1 View  10/23/2014   CLINICAL DATA:  Breast cancer.  Fever  EXAM: PORTABLE CHEST - 1 VIEW  COMPARISON:  10/15/2014  FINDINGS: Interval development of ill-defined airspace disease bilaterally, right greater than left. This could represent diffuse pneumonitis or interstitial edema. Lungs were clear previously. No pleural effusion.  Apical pleural scarring bilaterally right greater than left. Prominent calcification mitral annulus. Heart size upper normal.  IMPRESSION: Interval development of bilateral  airspace disease. This may represent pneumonitis as the patient has a fever. Pulmonary edema also could have this appearance.   Electronically Signed   By: Franchot Gallo M.D.   On: 10/23/2014 09:17   Dg Chest Port 1 View  10/16/2014   CLINICAL DATA:  Shortness of breath and lethargy  EXAM: PORTABLE CHEST - 1 VIEW  COMPARISON:  September 26, 2014  FINDINGS: There is no edema or consolidation. Heart size and pulmonary vascularity are normal. No adenopathy. There is calcification in the mitral annulus. There is degenerative change in the lower thoracic and upper lumbar region with upper lumbar levoscoliosis.  IMPRESSION: No edema or consolidation.   Electronically Signed   By: Lowella Grip M.D.   On: 10/16/2014 08:16    Scheduled Meds: . aztreonam  1 g Intravenous 3 times per day  . feeding supplement (PRO-STAT SUGAR FREE 64)  30 mL Per Tube BID WC  . fentaNYL      . folic acid  1 mg Per Tube Daily  . free water  200 mL Per Tube Q6H  . insulin aspart  0-15 Units Subcutaneous 5 times per day  . lidocaine      . lidocaine  1 application Topical Daily  . metoprolol tartrate  6.25 mg Per Tube BID  . midazolam      . nystatin  5 mL Oral QID  . ondansetron  4 mg Oral Once  . PERATIVE  180 mL Oral 5 X Daily  . potassium chloride  20 mEq Per Tube Daily  . predniSONE  15 mg Per Tube Q breakfast  . ranitidine  150 mg Per Tube QHS  . saccharomyces boulardii  250 mg Oral BID  . silver sulfADIAZINE   Topical QID  . vancomycin  500 mg Intravenous Q12H   Continuous Infusions: . 0.9 % NaCl with KCl 40 mEq / L 75 mL/hr (10/25/14 0853)    Active Problems:   Sepsis   Severe sepsis   New onset type 2 diabetes mellitus   UTI (lower urinary tract infection)   Sacral wound   Hyperglycemia   Protein-calorie malnutrition, severe   Diabetic hyperosmolar non-ketotic state   Decubitus skin ulcer   DVT (deep venous thrombosis)    Time spent: 40 minutes   [facility] Pager  [phone]. If 7PM-7AM, please contact night-coverage at [email or web address], password [facility] 10/25/2014, 4:46 PM  LOS: 9 days

## 2014-10-25 NOTE — Progress Notes (Signed)
PT Cancellation Note  Patient Details Name: Martha Mcbride MRN: [id number] DOB: 07-18-1925   Cancelled Treatment:     IVC Filter Placement this am.  Attempted yesterday however unable to complete. Will check back tomorrow.   Nathanial Rancher 10/25/2014, 11:15 AM

## 2014-10-25 NOTE — Progress Notes (Signed)
NUTRITION FOLLOW-UP  INTERVENTION: -Continue  Perative formula, bolus feeds of 180 mL at 6am, 10am, 2 pm, 6 pm, and 10 pm. -30 ml Prostat BID  Current TF with Prostat provides 1370 kcal (86% of estimated needs), 90 g protein (100% of estimated needs) and  711 ml of H2O.   -Monitor Blood Glucose -RD will continue to monitor  NUTRITION DIAGNOSIS: Inadequate oral intake related to inability to eat as evidenced by NPO status, ongoing.  Goal: Pt to meet >/= 90% of their estimated nutrition needs, unmet   Monitor:  TF regimen & tolerance, weight, labs, I/O's  ASSESSMENT: 81 y.o female who on 1/19  presented to the [facility] emergency room via EMS with progressive confusion, lethargy and tachypnea.  1/20: -Pt familiar to this RD from previous admissions (07/30/14-08/10/14, 08/25/14-09/05/14). -Pt was admitted with CBGs in the 700s, HgbA1c of 8.2 and pt has been experiencing some diarrhea which stopped when TF was held. -Per granddaughter, pt on home TF regimen, Perative formula, bolus feeds of 8 oz at 6am, then 7 oz at 10am, 2 pm, 6 pm, and 10 pm. This provides. 1388 kcal, 72g protein and 843 ml of H2O. Family would prefer to continue with the same formula as they saw less diarrhea and side effects with this formula vs. Jevity 1.2. -Spoke with pharmacy about ordering this product, per policy it should be provided within 72 hours. Family has opted to provide supply of Perative formula from home. RD to order TF initiation and TF to start when family supplies formula.  1/21 -Pt is tolerating 1/2 doses of Perative TF 5 times daily. -Will monitor tolerance a little longer and advance to home regimen. -Pt with some diarrhea since TF started, c diff pending. -Spoke with granddaughter at bedside, reports no issues currently. Has no questions at this time.  1/22: - Pt tolerating 1/2 doses of TF at this time. Per RN, pt without diarrhea, but stools are soft. Pt's family member had questions  about pt's blood sugar being high. Concerned whether TF formula should be changed. RD recommended to stay on the current formula through the weekend and monitor blood glucose. Pt with history of severe diarrhea with formula change.   1/24 -Pt tolerating TF at this time. Pt's TF will be advanced to 5 cans of Perative daily. -Spoke with pt's daughter [name]. Per daughter, pt was receiving ProMod supplement in addition to [employer]. RD to order 30 ml Prostat daily to better meet protein requirements. -Daughter was upset during visit, had questions as to why pharmacy did not provide TF formula, why TF was advanced so slowly, and why pt's albumin is low. RD answered all questions. Explained to pt's family that recommendations were made in RD note 1/22 but order was not changed. Apologized and assured daughter that we would be following daily and would advance TF today.  -When asked if the family wanted pharmacy to order additional cans of Perative formula, family declined.  1/25 -Spoke with pt's family. RD to advance Perative to 8 oz 5x/day to meet 100% of estimated needs. RN notified of change and to advance starting with 1800 feeding. -Pt is to be discharged tomorrow per SW note -CBGs:92-197 mg/dL  1/28: - Spoke with pt's daughter who said that pt is still having diarrhea from TF. Per RN and daughter, pt has had 2 bowel movements today. Daughter with questions regarding changing TF formula to "lower glycemic formula" due to concerns with pt's blood glucose being elevated. RD recommended that  pt be able to tolerate this formula before changing due to history of diarrhea from several formulas. Current formula has given pt the least diarrhea. Pt's blood glucose is currently 108-187. Will continue with current regimen until pt tolerating. Daughter to know what pt's platelet count was. Referred to RN.   Labs: CBGs 108-187 Na and K WNL Mg WNL Albumin low  Height: Ht Readings from Last 1 Encounters:   10/16/14 5\' 2"  (1.575 m)    Weight: Wt Readings from Last 1 Encounters:  10/24/14 115 lb 1.6 oz (52.209 kg)    BMI:  Body mass index is 21.05 kg/(m^2).  Estimated Nutritional Needs: Kcal: 1600-1800 Protein: 85-95g Fluid: 1.6L/day  Skin: gangrenous toes, stage 3 sacral pressure ulcer, +2 RLE, +2 LLE    Diet Order: Diet - low sodium heart healthy Diet NPO time specified  EDUCATION NEEDS: -Education needs addressed   Intake/Output Summary (Last 24 hours) at 10/25/14 1350 Last data filed at 10/24/14 2044  Gross per 24 hour  Intake    377 ml  Output    500 ml  Net   -123 ml    Last BM: 1/24  Labs:   Recent Labs Lab 10/23/14 0015 10/23/14 0456 10/24/14 0417 10/25/14 0445  NA 142  --  139 138  K 3.2*  --  4.2 4.1  CL 110  --  112 110  CO2 24  --  22 21  BUN 19  --  17 12  CREATININE 0.48*  --  0.46* 0.38*  CALCIUM 7.6*  --  7.6* 7.9*  MG 2.0 2.0  --  1.9  GLUCOSE 113*  --  117* 113*    CBG (last 3)   Recent Labs  10/24/14 1825 10/24/14 2159 10/25/14 0618  GLUCAP 187* 108* 112*    Scheduled Meds: . aztreonam  1 g Intravenous 3 times per day  . feeding supplement (PRO-STAT SUGAR FREE 64)  30 mL Per Tube BID WC  . fentaNYL      . folic acid  1 mg Per Tube Daily  . free water  200 mL Per Tube Q6H  . insulin aspart  0-15 Units Subcutaneous 5 times per day  . lidocaine      . lidocaine  1 application Topical Daily  . metoprolol tartrate  6.25 mg Per Tube BID  . midazolam      . nystatin  5 mL Oral QID  . ondansetron  4 mg Oral Once  . PERATIVE  180 mL Oral 5 X Daily  . potassium chloride  20 mEq Per Tube Daily  . predniSONE  15 mg Per Tube Q breakfast  . ranitidine  150 mg Per Tube QHS  . saccharomyces boulardii  250 mg Oral BID  . silver sulfADIAZINE   Topical QID  . vancomycin  500 mg Intravenous Q12H    Continuous Infusions: . 0.9 % NaCl with KCl 40 mEq / L 75 mL/hr (10/25/14 0853)    Laurette Schimke MS, RD, LDN

## 2014-10-25 NOTE — Procedures (Signed)
Procedure:  IVC filter placement Access:  Right IJ vein Findings:  Normally patent IVC.  [place] IVC filter placed in infrarenal IVC.  No complications.

## 2014-10-26 LAB — CBC
HEMATOCRIT: 34.5 % — AB (ref 36.0–46.0)
HEMOGLOBIN: 10.9 g/dL — AB (ref 12.0–15.0)
MCH: 31.1 pg (ref 26.0–34.0)
MCHC: 31.6 g/dL (ref 30.0–36.0)
MCV: 98.6 fL (ref 78.0–100.0)
Platelets: 149 10*3/uL — ABNORMAL LOW (ref 150–400)
RBC: 3.5 MIL/uL — ABNORMAL LOW (ref 3.87–5.11)
RDW: 16.7 % — AB (ref 11.5–15.5)
WBC: 4.3 10*3/uL (ref 4.0–10.5)

## 2014-10-26 LAB — GLUCOSE, CAPILLARY
GLUCOSE-CAPILLARY: 89 mg/dL (ref 70–99)
GLUCOSE-CAPILLARY: 147 mg/dL — AB (ref 70–99)
Glucose-Capillary: 199 mg/dL — ABNORMAL HIGH (ref 70–99)

## 2014-10-26 LAB — GI PATHOGEN PANEL BY PCR, STOOL
C difficile toxin A/B: NOT DETECTED
CAMPYLOBACTER BY PCR: NOT DETECTED
Cryptosporidium by PCR: NOT DETECTED
E COLI (ETEC) LT/ST: NOT DETECTED
E COLI 0157 BY PCR: NOT DETECTED
E coli (STEC): NOT DETECTED
G lamblia by PCR: NOT DETECTED
NOROVIRUS G1/G2: NOT DETECTED
Rotavirus A by PCR: NOT DETECTED
Salmonella by PCR: NOT DETECTED
Shigella by PCR: NOT DETECTED

## 2014-10-26 LAB — NOROVIRUS GROUP 1 & 2 BY PCR, STOOL: NOROVIRUS RNA, RT, PCR: NOT DETECTED

## 2014-10-26 MED ORDER — PERATIVE PO LIQD: 180.0000 mL | Freq: Every day | ORAL | Status: DC

## 2014-10-26 MED ORDER — TRAMADOL HCL 50 MG PO TABS: 50.0000 mg | ORAL_TABLET | Freq: Three times a day (TID) | ORAL | Status: DC | PRN

## 2014-10-26 MED ORDER — ONDANSETRON HCL 4 MG/5ML PO SOLN: 4.0000 mg | Freq: Once | ORAL | Status: DC

## 2014-10-26 MED ORDER — ACETAMINOPHEN 160 MG/5ML PO SOLN: 650.0000 mg | Freq: Four times a day (QID) | ORAL | Status: DC | PRN

## 2014-10-26 MED ORDER — SILVER SULFADIAZINE 1 % EX CREA: TOPICAL_CREAM | Freq: Four times a day (QID) | CUTANEOUS | Status: DC

## 2014-10-26 MED ORDER — PREDNISONE 5 MG PO TABS: 15.0000 mg | ORAL_TABLET | Freq: Every day | ORAL | Status: DC

## 2014-10-26 MED ORDER — INSULIN ASPART 100 UNIT/ML ~~LOC~~ SOLN: SUBCUTANEOUS | Status: DC

## 2014-10-26 MED ORDER — SACCHAROMYCES BOULARDII 250 MG PO CAPS: ORAL_CAPSULE | ORAL | Status: DC

## 2014-10-26 NOTE — Discharge Summary (Addendum)
Physician Discharge Summary  Martha Mcbride MRN: [id number] DOB/AGE: Jun 24, 1925 81 y.o.  PCP: Kandice Hams, MD   Admit date: 10/16/2014 Discharge date: 10/26/2014  Discharge Diagnoses:  Sepsis  Severe sepsis  New onset type 2 diabetes mellitus  UTI (lower urinary tract infection)  Sacral wound Hyperosmolar hyperglycemic state  Protein-calorie malnutrition, severe  Diabetic hyperosmolar non-ketotic state  Decubitus skin ulcer Status post IVC. Placement Bilateral lower extremity DVT    Medication List    STOP taking these medications        ciprofloxacin 500 MG tablet  Commonly known as:  CIPRO     furosemide 10 MG/ML solution  Commonly known as:  LASIX  Replaced by:  furosemide 8 MG/ML solution     loperamide 1 MG/5ML solution  Commonly known as:  IMODIUM     predniSONE 5 MG/5ML solution  Replaced by:  predniSONE 5 MG tablet      TAKE these medications        acetaminophen 160 MG/5ML solution  Commonly known as:  TYLENOL  Place 20.3 mLs (650 mg total) into feeding tube every 6 (six) hours as needed for mild pain.     aspirin 81 MG chewable tablet  Place 1 tablet (81 mg total) into feeding tube daily.     feeding supplement (PRO-STAT SUGAR FREE 64) Liqd  Place 30 mLs into feeding tube 2 (two) times daily with a meal.     folic acid 1 MG tablet  Commonly known as:  FOLVITE  Place 1 tablet (1 mg total) into feeding tube daily.     free water Soln  Place 200 mLs into feeding tube every 6 (six) hours.     furosemide 8 MG/ML solution  Commonly known as:  LASIX  Place 2.5 mLs (20 mg total) into feeding tube 2 (two) times daily.     hydroxypropyl methylcellulose / hypromellose 2.5 % ophthalmic solution  Commonly known as:  ISOPTO TEARS / GONIOVISC  Place 1 drop into both eyes daily as needed for dry eyes.     insulin aspart 100 UNIT/ML injection  Commonly known as:  novoLOG  - Order Questions    -  Question Answer Comment   -   Correction coverage: Moderate (average weight, post-op)    -  CBG < 70: implement hypoglycemia protocol    -  CBG 70 - 120: 0 units    -  CBG 121 - 150: 2 units    -  CBG 151 - 200: 3 units    -  CBG 201 - 250: 5 units    -  CBG 251 - 300: 8 units    -  CBG 301 - 350: 11 units    -  CBG 351 - 400: 15 units    -  CBG > 400 call MD and obtain STAT lab verification    -      lidocaine 2 % jelly  Commonly known as:  XYLOCAINE  Apply 1 application topically as needed (for sores).     metoprolol tartrate 25 mg/10 mL Susp  Commonly known as:  LOPRESSOR  Place 2.5 mLs (6.25 mg total) into feeding tube 2 (two) times daily.     nystatin 100000 UNIT/ML suspension  Commonly known as:  MYCOSTATIN  Take 5 mLs (500,000 Units total) by mouth 4 (four) times daily.     ondansetron 4 MG/5ML solution  Commonly known as:  ZOFRAN  Place 5 mLs (4 mg total) into  feeding tube once.     PERATIVE Liqd  8oz at [employer], 7oz at 10am, 2pm, 6pm and 10pm via G tube     PERATIVE Liqd  Place 180 mLs into feeding tube 5 (five) times daily.     PERATIVE Liqd  Place 180 mLs into feeding tube 5 (five) times daily.     potassium chloride 20 MEQ/15ML (10%) Soln  Place 15 mLs (20 mEq total) into feeding tube daily.     predniSONE 5 MG tablet  Commonly known as:  DELTASONE  Place 3 tablets (15 mg total) into feeding tube daily with breakfast.     ranitidine 75 MG/5ML syrup  Commonly known as:  ZANTAC  Place 10 mLs (150 mg total) into feeding tube at bedtime.     saccharomyces boulardii 250 MG capsule  Commonly known as:  FLORASTOR  250m per feeding tube two times a day     silver sulfADIAZINE 1 % cream  Commonly known as:  SILVADENE  Apply topically 4 (four) times daily.     traMADol 50 MG tablet  Commonly known as:  ULTRAM  Place 1 tablet (50 mg total) into feeding tube every 8 (eight) hours as needed for moderate pain.         Nutrition recommendations  Continue  Perative formula, bolus feeds of 180 mL at 6am, 10am, 2 pm, 6 pm, and 10 pm. -30 ml Prostat BID  Current TF with Prostat provides 1370 kcal (86% of estimated needs), 90 g protein (100% of estimated needs) and 711 ml of H2O.   -Monitor Blood Glucose -RD will continue to monitor  Receiving ProMod BID at home, therefore continue Prostat to BID to mirror routine at home.   Follow-up recommendations follow-up with PCP in 3-5 days Follow-up with rheumatology in 3-5 days CBC, BMP weekly Patient needs air overlay mattress for sacral decubitus ulcers     Discharge Condition:    Disposition: 01-Home or Self Care   Consults:  Critical care Wound care   Significant Diagnostic Studies: Ct Abdomen Pelvis Wo Contrast  10/16/2014   CLINICAL DATA:  Decubitus ulcer  EXAM: CT ABDOMEN AND PELVIS WITHOUT CONTRAST  TECHNIQUE: Multidetector CT imaging of the abdomen and pelvis was performed following the standard protocol without IV contrast.  COMPARISON:  05/21/2012  FINDINGS: BODY WALL:  There is ulceration of the mid gluteal cleft with granulation tissue or other soft tissue extends to contact the sacrum, appearance similar to previous. There are no sacral changes of osteomyelitis. Fissured appearance of the lower gluteal cleft, extending towards the anus. There is skin thickening and subcutaneous reticulation in the lower left gluteal region which likely also represents pressure changes. No evidence of tract.  Lumpectomy in the lower right breast. Surgical size stable from prior.  LOWER CHEST: There is a moderate sliding-type hiatal hernia. Detection of esophageal or gastric thickening not possible due to under distention and lack of IV contrast.  Bulky mitral annular calcification noted. Chronic subpleural reticulation in the lower lungs.  ABDOMEN/PELVIS:  Liver: Small cysts again noted in the liver.  Biliary: Cholelithiasis. Gallbladder is full but there is no inflammatory change.  Pancreas:  Unremarkable.  Spleen: Unremarkable.  Adrenals: Unremarkable.  Kidneys and ureters: 2 small high-density areas within the lower right kidney likely reflects small hemorrhagic cyst. Small low-density foci in the more cranial right renal cortex is likely also cysts.  Bladder: Decompressed by Foley catheter.  Reproductive: Hysterectomy. Decreased size of a cyst in the right hemipelvis, currently  4.8 cm previously 6.4 cm. As noted previously this could be an ovarian or peritoneal cyst.  Bowel: Sliding hiatal hernia. There is percutaneous gastrostomy tube which is in good position. No pericecal inflammation.  Retroperitoneum: No mass or adenopathy.  Peritoneum: No ascites or pneumoperitoneum.  Vascular: No acute abnormality.  OSSEOUS: Healed insufficiency fractures of the right obturator ring. Diffuse and advanced lumbar degenerative disc disease with levoscoliosis. No acute fracture.  IMPRESSION: 1. Decubitus ulcers, the deepest in the gluteal cleft. No abscess, undermining, or osteomyelitis identified. 2. Chronic findings are stable from 2013 and noted above.   Electronically Signed   By: Jorje Guild M.D.   On: 10/16/2014 22:03   Dg Chest 1 View  09/26/2014   CLINICAL DATA:  Pneumonia.  EXAM: CHEST - 1 VIEW  COMPARISON:  09/14/2014.  02/10/2014.  FINDINGS: Mediastinum and hilar structures normal. Cardiomegaly with normal pulmonary vascularity. No focal pulmonary infiltrate. Chronic interstitial prominence. No pleural effusion or pneumothorax. Degenerative changes thoracic spine. Sliding hiatal hernia.  IMPRESSION: 1. Cardiomegaly.  No overt pulmonary edema. 2. Chronic interstitial lung disease. No focal pulmonary infiltrate.   Electronically Signed   By: Marcello Moores  Register   On: 09/26/2014 11:36   Ir Ivc Filter Plmt / S&i /img Guid/mod Sed  10/25/2014   CLINICAL DATA:  Bilateral lower extremity deep venous thrombosis and poor candidate for long-term anti coagulation. Request has been made to place a permanent  IVC filter.  EXAM: 1. ULTRASOUND GUIDANCE FOR VASCULAR ACCESS OF THE RIGHT INTERNAL JUGULAR VEIN. 2. IVC VENOGRAM. 3. PERCUTANEOUS IVC FILTER PLACEMENT.  ANESTHESIA/SEDATION: 1.0 mg IV Versed; 75 mcg IV Fentanyl.  Total Moderate Sedation Time  9mnutes.  CONTRAST:  30 mL Omnipaque 300  FLUOROSCOPY TIME:  48 seconds.  PROCEDURE: The procedure, risks, benefits, and alternatives were explained to the patient. Questions regarding the procedure were encouraged and answered. The patient understands and consents to the procedure.  The right neck was prepped with chlorhexidine in a sterile fashion, and a sterile drape was applied covering the operative field. A sterile gown and sterile gloves were used for the procedure. Local anesthesia was provided with 1% Lidocaine. A time-out was performed prior to the procedure.  Ultrasound was used to confirm patency of the right internal jugular vein. Under direct ultrasound guidance, a 21 gauge needle was advanced into the right internal jugular vein with ultrasound image documentation performed. After securing access with a micropuncture dilator, a guidewire was advanced into the inferior vena cava. A deployment sheath was advanced over the guidewire. This was utilized to perform IVC venography.  The deployment sheath was further positioned in an appropriate location for filter deployment. A Bard Denali IVC filter was then advanced in the sheath. This was then fully deployed in the infrarenal IVC. Final filter position was confirmed with a fluoroscopic spot image. Contrast injection was also performed through the sheath under fluoroscopy to confirm patency of the IVC at the level of the filter. After the procedure the sheath was removed and hemostasis obtained with manual compression.  COMPLICATIONS: None.  FINDINGS: IVC venography demonstrates a normal caliber IVC with no evidence of thrombus. Renal veins are identified bilaterally. The IVC filter was successfully positioned  below the level of the renal veins and is appropriately oriented. This IVC filter has both permanent and retrievable indications.  IMPRESSION: Placement of percutaneous IVC filter in infrarenal IVC. IVC venogram shows no evidence of IVC thrombus and normal caliber of the inferior vena cava.   Electronically Signed  By: Aletta Edouard M.D.   On: 10/25/2014 11:48   Dg Chest Port 1 View  10/23/2014   CLINICAL DATA:  Breast cancer.  Fever  EXAM: PORTABLE CHEST - 1 VIEW  COMPARISON:  10/15/2014  FINDINGS: Interval development of ill-defined airspace disease bilaterally, right greater than left. This could represent diffuse pneumonitis or interstitial edema. Lungs were clear previously. No pleural effusion.  Apical pleural scarring bilaterally right greater than left. Prominent calcification mitral annulus. Heart size upper normal.  IMPRESSION: Interval development of bilateral airspace disease. This may represent pneumonitis as the patient has a fever. Pulmonary edema also could have this appearance.   Electronically Signed   By: Franchot Gallo M.D.   On: 10/23/2014 09:17   Dg Chest Port 1 View  10/16/2014   CLINICAL DATA:  Shortness of breath and lethargy  EXAM: PORTABLE CHEST - 1 VIEW  COMPARISON:  September 26, 2014  FINDINGS: There is no edema or consolidation. Heart size and pulmonary vascularity are normal. No adenopathy. There is calcification in the mitral annulus. There is degenerative change in the lower thoracic and upper lumbar region with upper lumbar levoscoliosis.  IMPRESSION: No edema or consolidation.   Electronically Signed   By: Lowella Grip M.D.   On: 10/16/2014 08:16      Microbiology: Recent Results (from the past 240 hour(s))  MRSA PCR Screening     Status: None   Collection Time: 10/16/14  1:30 PM  Result Value Ref Range Status   MRSA by PCR NEGATIVE NEGATIVE Final    Comment:        The GeneXpert MRSA Assay (FDA approved for NASAL specimens only), is one component of  a comprehensive MRSA colonization surveillance program. It is not intended to diagnose MRSA infection nor to guide or monitor treatment for MRSA infections.   Clostridium Difficile by PCR     Status: None   Collection Time: 10/18/14  9:50 AM  Result Value Ref Range Status   C difficile by pcr NEGATIVE NEGATIVE Final    Comment: Performed at [facility]  Clostridium Difficile by PCR     Status: None   Collection Time: 10/22/14 11:49 PM  Result Value Ref Range Status   C difficile by pcr NEGATIVE NEGATIVE Final    Comment: Performed at [facility]  Culture, blood (routine x 2)     Status: None (Preliminary result)   Collection Time: 10/23/14 12:25 PM  Result Value Ref Range Status   Specimen Description BLOOD RIGHT HAND  Final   Special Requests BOTTLES DRAWN AEROBIC AND ANAEROBIC 5ML  Final   Culture   Final           BLOOD CULTURE RECEIVED NO GROWTH TO DATE CULTURE WILL BE HELD FOR 5 DAYS BEFORE ISSUING A FINAL NEGATIVE REPORT Performed at [facility]    Report Status PENDING  Incomplete  Culture, blood (routine x 2)     Status: None (Preliminary result)   Collection Time: 10/23/14 12:25 PM  Result Value Ref Range Status   Specimen Description BLOOD RIGHT HAND  Final   Special Requests BOTTLES DRAWN AEROBIC AND ANAEROBIC 5ML  Final   Culture   Final           BLOOD CULTURE RECEIVED NO GROWTH TO DATE CULTURE WILL BE HELD FOR 5 DAYS BEFORE ISSUING A FINAL NEGATIVE REPORT Performed at [facility]    Report Status PENDING  Incomplete     Labs: Results for  orders placed or performed during the hospital encounter of 10/16/14 (from the past 48 hour(s))  Glucose, capillary     Status: Abnormal   Collection Time: 10/24/14  6:25 PM  Result Value Ref Range   Glucose-Capillary 187 (H) 70 - 99 mg/dL   Comment 1 Documented in Chart    Comment 2 Notify RN   Glucose, capillary     Status: Abnormal   Collection Time: 10/24/14  9:59 PM  Result Value  Ref Range   Glucose-Capillary 108 (H) 70 - 99 mg/dL   Comment 1 Documented in Chart    Comment 2 Notify RN   CBC     Status: Abnormal   Collection Time: 10/25/14  4:45 AM  Result Value Ref Range   WBC 5.1 4.0 - 10.5 K/uL   RBC 3.51 (L) 3.87 - 5.11 MIL/uL   Hemoglobin 11.1 (L) 12.0 - 15.0 g/dL   HCT 34.9 (L) 36.0 - 46.0 %   MCV 99.4 78.0 - 100.0 fL   MCH 31.6 26.0 - 34.0 pg   MCHC 31.8 30.0 - 36.0 g/dL   RDW 16.8 (H) 11.5 - 15.5 %   Platelets 163 150 - 400 K/uL  Comprehensive metabolic panel     Status: Abnormal   Collection Time: 10/25/14  4:45 AM  Result Value Ref Range   Sodium 138 135 - 145 mmol/L   Potassium 4.1 3.5 - 5.1 mmol/L   Chloride 110 96 - 112 mmol/L   CO2 21 19 - 32 mmol/L   Glucose, Bld 113 (H) 70 - 99 mg/dL   BUN 12 6 - 23 mg/dL   Creatinine, Ser 0.38 (L) 0.50 - 1.10 mg/dL   Calcium 7.9 (L) 8.4 - 10.5 mg/dL   Total Protein 4.8 (L) 6.0 - 8.3 g/dL   Albumin 2.1 (L) 3.5 - 5.2 g/dL   AST 30 0 - 37 U/L   ALT 20 0 - 35 U/L   Alkaline Phosphatase 114 39 - 117 U/L   Total Bilirubin 0.7 0.3 - 1.2 mg/dL   GFR calc non Af Amer >90 >90 mL/min   GFR calc Af Amer >90 >90 mL/min    Comment: (NOTE) The eGFR has been calculated using the CKD EPI equation. This calculation has not been validated in all clinical situations. eGFR's persistently <90 mL/min signify possible Chronic Kidney Disease.    Anion gap 7 5 - 15  Magnesium     Status: None   Collection Time: 10/25/14  4:45 AM  Result Value Ref Range   Magnesium 1.9 1.5 - 2.5 mg/dL  Glucose, capillary     Status: Abnormal   Collection Time: 10/25/14  6:18 AM  Result Value Ref Range   Glucose-Capillary 112 (H) 70 - 99 mg/dL   Comment 1 Notify RN   Glucose, capillary     Status: Abnormal   Collection Time: 10/25/14  6:09 PM  Result Value Ref Range   Glucose-Capillary 199 (H) 70 - 99 mg/dL  Glucose, capillary     Status: Abnormal   Collection Time: 10/25/14  9:49 PM  Result Value Ref Range   Glucose-Capillary  177 (H) 70 - 99 mg/dL  CBC     Status: Abnormal   Collection Time: 10/26/14  4:25 AM  Result Value Ref Range   WBC 4.3 4.0 - 10.5 K/uL   RBC 3.50 (L) 3.87 - 5.11 MIL/uL   Hemoglobin 10.9 (L) 12.0 - 15.0 g/dL   HCT 34.5 (L) 36.0 - 46.0 %  MCV 98.6 78.0 - 100.0 fL   MCH 31.1 26.0 - 34.0 pg   MCHC 31.6 30.0 - 36.0 g/dL   RDW 16.7 (H) 11.5 - 15.5 %   Platelets 149 (L) 150 - 400 K/uL  Glucose, capillary     Status: None   Collection Time: 10/26/14  6:18 AM  Result Value Ref Range   Glucose-Capillary 89 70 - 99 mg/dL   Comment 1 Notify RN   Glucose, capillary     Status: Abnormal   Collection Time: 10/26/14 10:20 AM  Result Value Ref Range   Glucose-Capillary 147 (H) 70 - 99 mg/dL   Comment 1 Documented in Chart    Comment 2 Notify RN      HPI : 81 y/o F with a past medical history of breast cancer (remote), dementia, chronic diastolic CHF (baseline Lasix 20 mg BID), aortic stenosis and severely calcified mitral valve with moderate stenosis, gangrenous toes on the right foot (followed by Dr. Oneida Alar), polymyalgia rheumatica on prednisone (20 mg QD), interstitial lung disease / presumed IPF, large hiatal hernia, severe dysphasia status post PEG tube placement, multiple admissions for aspiration pneumonia, chronic diarrhea, who is chair/bed bound at baseline (followed at the wound clinic at [facility]). She is cared for at home by her daughters [name] and [name].   Chart review notes that they call the cardiology office on 1/17 with concerns for tachycardia and lethargy. The daughters medicated the patient with an additional dose of Lopressor. They returned to the cardiology office for an acute visit on 1/18 with a three-day history of progressive weakness and fatigue. Notes reflect that a urinalysis was obtained and the family was encouraged to send the patient to the hospital for evaluation. She was treated with Cipro for UTI. However, they obtained a urinalysis and then proceeded to leave  the office. They called the office to let them know that they were taking her home. Attempts were made to contact her via phone & messages were left at that time.   On 1/19 the patient presented to the [facility] emergency room via EMS with progressive confusion, lethargy and tachypnea. Initial BP 118/65, rate 133, rectal temperature of 101.2, RR 31 and SPO2 94%. Initial lab work notable for hgb 15.5, Na 151, CL 1:15, BUN 54, sr cr on 0.17, albumin 2.9, glucose 735 and lactic acid of 6.69. Repeat urinalysis on 1/19 with glucose greater than 1000, negative nitrite, negative leukocytes, 3-6 WBC, and few bacteria. CXR was negative for acute infiltrate. She was started on an insulin drip in the emergency room and treated with IV abx for potential wound infection/UTI. Admitted by critical care because of a multitude of problems, subsequently transferred to the hospitalist service on 1/22.   HOSPITAL COURSE: *   Sacral decubitus ulcers/UTI/possible HCAP (1/26) Continues to ooze blood from wounds , Use silver sulfadiazine on wounds,santyl discontinued.Spread the silver sulfadiazine with tongue depressor   Patient is on a therapeutic mattress with low air loss feature, continue using air flow underpads, continue using paper towels for cleansing the perineum following frequent stools or urinary leakage around the catheter and periwound areas (versus terrycloth washcloths or gauze) and continue turning and repositioning the patient per protocol. Pain controlled with tramadol Patient has been on multiple antibiotics for sacral decubitus ulcer, UTI, HCAP since 1/19-1/29 Antiinfectives  1/19 >>aztreonam >> 1/22 1/19 >>vancomycin >> 1/22 1/19 >>metronidazole >> 1/22 1/19 >>levofloxacin >> 1/19 1/22 >> ciprofloxacin >> 1/25  1/22 > clindamycin >> 1/25 Vancomycin  and aztreonam 1/25-1/29 Culture data BCx2 1/19 and 1/26 >> NGSF  UC 1/19 >> NGSF C-Diff 1/19 >> negative *2 Norovirus 1/19 >>  pending since 1/19  Wound Care recommends hyperbaric therapy at the wound care center. Continue florastor  CT negative for abscess. 1/19 Consider hyperbaric Rx as an outpatient , patient has seen a plastic surgeon in [place] and should also follow-up with them      Bilateral DVTLovenox therapeutic dose started 1/23 , family refused Coumadin because he felt that this was causing the patient's diarrhea Family agreed to proceed with IVC filter, S/p IVC filter , discontinued all anticoagulation because of wound oozing blood Recommended to the family they would need to consider longterm anticoagulation for patients IVC filter and it would need to be lifelong once the wound heals    interstitial lung disease/aspiration PNA  Continue Aspiration precautions, pulmonary hygiene continue tube feedings Continue checking post tube feeding residuals, advance to feeding carefully given increased diarrhea and aspiration risk after tube feeding was advanced Continue prednisone 15 mg a day, slow outpatient taper to be monitored closely by rheumatology    Thrush-improved Better with nystatin swish and spit  Continue   Hypertension,AS - moderate on TEE 07/20/14 & calcified MV Continue Lopressor, continue low-dose Lasix   Hypernatremia, hypokalemia , acute kidney injury-secondary to dehydration All of the above has resolved, continue low-dose maintenance hydration Continue free water through feeding tube Continue Foley catheter given perineal ulcers  Hyperglycemia,DM 2  Accu-Chek 5 times a day prior to bolus feeding Cover With moderate sliding scale insulin    Severe Dysphagia - s/p PEG, NPO at baseline (since late fall 2015). In setting of large hiatal hernia. They have refused MBS during this hospitalization Continue home PEG tube feeding, nutrition following  Because of diarrhea, tube feeding bolus changed to 180 mL from 240 mL bolus Make any  change in TF only when family  requests to do so     Thrombocytopenia likely secondary to sepsis Follow CBC. Thrombocytopenia is improving , platelet count 149  Asked the family if they had any questions for me to prior to DC .they were satisfied with everything   Discharge Exam:    Blood pressure 132/55, pulse 84, temperature 98.6 F (37 C), temperature source Oral, resp. rate 18, height 5' 2" (1.575 m), weight 52.209 kg (115 lb 1.6 oz), SpO2 100 %.  General: Frail elderly female, awake, answering questions appropriately Neuro: Awake/alert, oriented to self, place & events, MAE, generalized weakness  HEENT: Mm pink/dry, no jvd, dentures in place  Cardiovascular: s1s2 rrr, 3/6 SEM Lungs: Even/non-labored, lungs bilaterally with fine basilar crackles Abdomen: PEG c/d/i, NTND       Discharge Instructions    Diet - low sodium heart healthy    Complete by:  As directed      Diet - low sodium heart healthy    Complete by:  As directed      Increase activity slowly    Complete by:  As directed      Increase activity slowly    Complete by:  As directed            Follow-up Information    Follow up with POLITE,RONALD D, MD. Schedule an appointment as soon as possible for a visit in 3 days.   Specialty:  Internal Medicine   Contact information:   [address] [place] [address] [phone]       Follow up with Hennie Duos,  MD. Schedule an appointment as soon as possible for a visit in 3 days.   Specialty:  Rheumatology   Contact information:   [address], [place]  [place] [address] [phone]       Signed: Reyne Dumas 10/26/2014, 11:04 AM

## 2014-10-26 NOTE — Progress Notes (Signed)
OT Cancellation Note  Patient Details Name: Martha Mcbride MRN: [id number] DOB: [birth year]   Cancelled Treatment:    Reason Eval/Treat Not Completed: Other (comment) Pt leaving for facility per family [name], Thereasa Parkin 10/26/2014, 10:59 AM

## 2014-10-26 NOTE — Progress Notes (Signed)
CSW continuing to follow.  CSW received notification from MD that pt medically ready for discharge today.  CSW met with pt, pt daughter, Martha Mcbride, and pt granddaughter, Martha Mcbride at bedside.   Pt daughter expressed concern about pt discharging today and concern about the plan. CSW discussed with pt daughter that [facility] as a private room available today for pt. Pt daughter, Martha Mcbride agreeable to pt transitioning to [facility] today.   CSW provided supportive listening as pt daughter expressed concern that pt was not responded to last night by nursing staff after pt had a bowel movement for three hours per pt daughter report. CSW notified RN and Therapist, sports of pt daughter concern in order for it to be addressed.  CSW notified MD of pt family acceptance of bed offer at [facility].  CSW to facilitate pt discharge needs this afternoon.  Alison Murray, MSW, [facility] [facility] [phone]

## 2014-10-26 NOTE — Progress Notes (Signed)
ANTIBIOTIC CONSULT NOTE - follow up  Pharmacy Consult for Vancomycin & Aztreonam Indication: HCAP  Allergies  Allergen Reactions  . Doxycycline Nausea And Vomiting  . Other Nausea And Vomiting and Other (See Comments)    Anesthesia makes her very sleepy and makes it hard for her to come out of it.   Marland Kitchen Penicillins Hives and Nausea And Vomiting  . Sulfa Antibiotics Nausea And Vomiting   Patient Measurements: Height: 5\' 2"  (157.5 cm) Weight: 115 lb 1.6 oz (52.209 kg) IBW/kg (Calculated) : 50.1  Assessment: 81 y/o F brought to ED 1/19 with fever, fatigue, AMS (seen in PCP office 1/17, Cardiology office 1/18 - suggested patient needed to be hospitalized). UA consistent with UTI, multiple deep decubitus ulcers present on buttocks, and lactate 6.69 - code sepsis called.  Aztreonam, vanco, metronidazole, levofloxacin ordered with pharmacy dosing assistance requested.  Patient had started on Cipro PTA as outpatient for UTI.  On 1/22, Pharmacy was consulted to to change antibiotics to ciprofloxacin for UTI. Clindamycin added for cellulitis coverage. CXray with infiltrates, rule out HCAP - start vanc/aztreonam for HCAP on 1/26  Antiinfectives  1/19 >>aztreonam >> 1/22 1/19 >>vancomycin >> 1/22 1/19 >>metronidazole >> 1/22 1/19 >>levofloxacin >> 1/19 1/22 >> ciprofloxacin >> 1/26 1/22 > clindamycin >> 1/26 1/26 >> Vancomycin >> 1/26 >> Aztreonam >>  Labs / vitals Tmax: afebrile WBCs: stable Renal: SCr stable, CrCl 39  Microbiology 1/19 blood x2: NG-final 1/19 urine: NG-final 1/19 wound (vagina): few candida sp- final 1/19 MRSA PCR: negative 1/21 Norovirus: remains in process 1/21 Cdiff: negative 1/25 CDiff: negative  Goal of Therapy:  Vancomycin trough 15-20 mcg/ml  Plan:  Day #11 total abx, Day #4 Vanc/Aztreonam  Continue Vancomycin 500mg  q12 for now  Continue Aztreonam 1gm q8  Monitor renal function closely, adjust abx as necessary  Will check vancomycin trough prior  to next dose of vanc at [place], PharmD, BCPS Pager [phone] 10/26/2014 9:59 AM

## 2014-10-26 NOTE — Progress Notes (Signed)
Pt for discharge to [facility].   CSW facilitated pt discharge needs including contacting facility, faxing pt discharge information to [facility] via [facility], discussing with pt daughter, [name] at bedside, providing RN phone number to call report, and arranging ambulance transport for pt via [facility] to [facility] scheduled for 4 pm.   Pt daughter appreciative of support and assistance.  No further social work needs identified at this time.  CSW signing off.   Martha Mcbride, MSW, [place] [employer] [phone]

## 2014-10-29 LAB — CULTURE, BLOOD (ROUTINE X 2)
Culture: NO GROWTH
Culture: NO GROWTH

## 2014-10-29 LAB — GLUCOSE, CAPILLARY: GLUCOSE-CAPILLARY: 230 mg/dL — AB (ref 70–99)

## 2014-11-21 ENCOUNTER — Ambulatory Visit (INDEPENDENT_AMBULATORY_CARE_PROVIDER_SITE_OTHER): Payer: Medicare Other | Admitting: Pulmonary Disease

## 2014-11-21 ENCOUNTER — Encounter: Payer: Self-pay | Admitting: Pulmonary Disease

## 2014-11-21 VITALS — BP 100/60 | HR 81 | Ht 66.0 in | Wt 133.6 lb

## 2014-11-21 DIAGNOSIS — J84112 Idiopathic pulmonary fibrosis: Secondary | ICD-10-CM

## 2014-11-21 DIAGNOSIS — I82403 Acute embolism and thrombosis of unspecified deep veins of lower extremity, bilateral: Secondary | ICD-10-CM

## 2014-11-21 NOTE — Patient Instructions (Signed)
Fibrosis appears stable Use oxygen during sleep & whenever satn lower than 88%

## 2014-11-21 NOTE — Progress Notes (Signed)
Subjective:    Patient ID: Martha Mcbride, female    DOB: November 05, 1924, 81 y.o.   MRN: [id number]  HPI  81 y.o. never smoker for follow-up of interstitial lung disease presumed IPF, on nocturnal oxygen PMH significant for Polymyalgia on prednisone, severely calcified mitral valve with moderate stenosis, chronic diastolic heart failure,Gangrenous toes on the right foot   Significant tests/ events  admission 10-27/15 -health care associated PNA, encephalopathy, and diastolic HF exacerbation.  Admitted 07/28/14 with fever 103, confusion,productive cough. WBC at 12, Lactic acid at 3.5. Chest x ray -diffuse bilateral pulmonary interstitial infiltrates.& eosinophilia (AEC 1800 on 10/22 which came down to 600 on 10/31). Diarrhea- resolved & felt unlikely to be parasitic by ID - stool O & P neg.   07/30/14 CT: Evidence of emphysema with traction bronchiectasis in multiple areas of interstitial fibrosis. This appearance is consistent with underlying usual interstitial pneumonitis. There also moderate pleural effusions.  04/2012 CT abd >>large hiatal hernia . Prominent interstitial lung markings suggest chronic changes  06/2014 TEE mild MS, mod AS, nml LVfn Ova and parasite stool neg.  Strongyloids antibody neg Quantiferon Tb neg   swallowing evaluation- severe oropharyngeal dysphagia status post PEG placement   Admitted 07/2014 for recurrent aspiration pneumonia, complicated by severe sepsis and decompensated congestive heart failure. Following with wound center with sacral wounds.  08/2014 c -ANCA 1:80, ANA 1:40   11/21/2014  Chief Complaint  Patient presents with  . Follow-up    breathing doing well.  no concerns.   She was admitted again 09/2014 for sacral wounds, issues with adrenal insufficiency 10/20/14 venous duplex-bilateral DVT, IVC filter placed CT abdomen 10/16/14-moderate sliding hernia. TEE-moderate aortic stenosis She was discharged to [facility], is  wheelchair-bound, has chronic Foley. Is improving with a rehabilitation, sacral wound is healing, prednisone is being tapered by Dr. Amil Amen down to 15 mg- Swallowing has improved, she is on mechanical soft diet in addition to tube feeds, diarrhea has resolved she is not using her oxygen much is able to stay off for long durations She had a fall in the bathroom and bruised her face  Past Medical History  Diagnosis Date  . Arthritis   . Polymyalgia   . Acid reflux   . Immune deficiency disorder     autoimmune disorder - polymyalgia - on prednisone  . Breast cancer     Treated with lumpectomy and radiation  . Giardia   . Pinworms   . History of hiatal hernia   . Pneumonia   . Dementia   . CHF (congestive heart failure)   . Dysrhythmia           Review of Systems neg for any significant sore throat, dysphagia, itching, sneezing, nasal congestion or excess/ purulent secretions, fever, chills, sweats, unintended wt loss, pleuritic or exertional cp, hempoptysis, orthopnea pnd or change in chronic leg swelling. Also denies presyncope, palpitations, heartburn, abdominal pain, nausea, vomiting, diarrhea or change in bowel or urinary habits, dysuria,hematuria, rash, arthralgias, visual complaints, headache, numbness weakness or ataxia.     Objective:   Physical Exam  Gen. Pleasant, and woman in wheelchair, in no distress ENT - no lesions, no post nasal drip Neck: No JVD, no thyromegaly, no carotid bruits Lungs: no use of accessory muscles, no dullness to percussion, bibasal one third rales no rhonchi  Cardiovascular: Rhythm regular, heart sounds  normal, no murmurs or gallops, no peripheral edema Musculoskeletal: No deformities, no cyanosis or clubbing        Assessment &  Plan:

## 2014-11-21 NOTE — Assessment & Plan Note (Signed)
Given recurrent falls, likely not a candidate for anticoagulation

## 2014-11-21 NOTE — Assessment & Plan Note (Signed)
She will likely need oxygen in some form during exertion and sleep. She is not a candidate for anti-fibrotic therapy

## 2014-12-14 ENCOUNTER — Telehealth (HOSPITAL_COMMUNITY): Payer: Self-pay | Admitting: Radiology

## 2014-12-14 NOTE — Telephone Encounter (Signed)
Rec'd phone call from Doristine Section, pt's daughter.  Requesting gastrostomy tube to either be removed or have the clamp replaced.  Clamp is not holding and food is backing up into catheter.  Gastrostomy last used on 11-13-14, pt eating 3 meals a day.  Patient's daughter informed that she could expect a return phone call today (12/14/14) from either myself or the Radiology PA.

## 2015-01-04 ENCOUNTER — Ambulatory Visit (HOSPITAL_COMMUNITY)
Admission: RE | Admit: 2015-01-04 | Discharge: 2015-01-04 | Disposition: A | Discharge status: Home / Self Care | Payer: Medicare Other | Source: Ambulatory Visit | Attending: Interventional Radiology | Admitting: Interventional Radiology

## 2015-01-04 ENCOUNTER — Other Ambulatory Visit (HOSPITAL_COMMUNITY): Payer: Self-pay | Admitting: Internal Medicine

## 2015-01-04 DIAGNOSIS — Z431 Encounter for attention to gastrostomy: Secondary | ICD-10-CM | POA: Diagnosis not present

## 2015-01-04 DIAGNOSIS — R1310 Dysphagia, unspecified: Secondary | ICD-10-CM

## 2015-01-04 MED ORDER — LIDOCAINE VISCOUS 2 % MT SOLN
OROMUCOSAL | Status: AC
  Administered 2015-01-04: 15 mL via OROMUCOSAL
  Filled 2015-01-04: qty 15

## 2015-01-04 MED ORDER — LIDOCAINE VISCOUS 2 % MT SOLN
15.0000 mL | Freq: Once | OROMUCOSAL | Status: AC
  Administered 2015-01-04: 15 mL via OROMUCOSAL

## 2015-01-04 NOTE — Procedures (Signed)
69 French pull-through gastrostomy tube removed in its entirety without immediate complications. Gauze dressing applied over site. Medication utilized-1%  lidocaine into gastrostomy tube insertion site tract.

## 2015-03-05 ENCOUNTER — Telehealth: Payer: Self-pay

## 2015-03-05 DIAGNOSIS — I70269 Atherosclerosis of native arteries of extremities with gangrene, unspecified extremity: Secondary | ICD-10-CM

## 2015-03-05 DIAGNOSIS — Z8672 Personal history of thrombophlebitis: Secondary | ICD-10-CM

## 2015-03-05 NOTE — Telephone Encounter (Signed)
Phone call from daughter.  Reported pt. saw Dr. Oneida Alar in past for black discoloration of two toes of right foot, and advised to paint with Betadine, and allow the tips to auto-amputate.  Stated the right 2nd toe appears puffy and light red over the past week; reported "a portion of the tip has fallen off, and a portion of it remains black and hard as a brick."  Also, reported the tip of the right 3rd toe has fallen off; stated the area looks okay.  Denied any swelling of lower extremities, with exception of the right 2nd toe.  Denied any drainage from toes; denied any open sores of lower extremities.  Reported the pt. Was diagnosed with bilateral LE DVTs, and had an IVC filter placed in IR, @ [employer] [facility] in January 2016.  Reported she would like to discuss continued need for compression stockings with Dr. Oneida Alar. Stated that she wanted to get his opinion on further management of her hx of DVT, and to get reevaluation of her right foot toes.  Advised will call daughter back re: appt.  Notified Dr. Oneida Alar of pt's symptoms.  Recommended to schedule ABI's and to do bilat LE venous duplex, along with office appt.

## 2015-03-06 ENCOUNTER — Encounter: Payer: Self-pay | Admitting: Family

## 2015-03-06 NOTE — Telephone Encounter (Signed)
Spoke with pts daughter to schedule, dpm °

## 2015-03-08 ENCOUNTER — Encounter (HOSPITAL_COMMUNITY): Payer: Medicare Other

## 2015-03-11 ENCOUNTER — Ambulatory Visit: Payer: Medicare Other | Admitting: Family

## 2015-03-22 ENCOUNTER — Ambulatory Visit (INDEPENDENT_AMBULATORY_CARE_PROVIDER_SITE_OTHER): Payer: Medicare Other | Admitting: Adult Health

## 2015-03-22 ENCOUNTER — Ambulatory Visit (INDEPENDENT_AMBULATORY_CARE_PROVIDER_SITE_OTHER)
Admission: RE | Admit: 2015-03-22 | Discharge: 2015-03-22 | Disposition: A | Discharge status: Home / Self Care | Payer: Medicare Other | Source: Ambulatory Visit | Attending: Adult Health | Admitting: Adult Health

## 2015-03-22 ENCOUNTER — Telehealth: Payer: Self-pay | Admitting: Pulmonary Disease

## 2015-03-22 ENCOUNTER — Encounter: Payer: Self-pay | Admitting: Adult Health

## 2015-03-22 VITALS — BP 116/68 | HR 69 | Temp 98.1°F | Ht 66.0 in | Wt 131.0 lb

## 2015-03-22 DIAGNOSIS — I5032 Chronic diastolic (congestive) heart failure: Secondary | ICD-10-CM | POA: Diagnosis not present

## 2015-03-22 DIAGNOSIS — J84112 Idiopathic pulmonary fibrosis: Secondary | ICD-10-CM

## 2015-03-22 NOTE — Assessment & Plan Note (Signed)
Comp stated without flare

## 2015-03-22 NOTE — Patient Instructions (Signed)
Continue on current regimen  Set up for oxygen test while sleeping .  Chest xray today  Follow with Rheumatology as planned and As needed   Follow up Dr. Elsworth Soho  In 4 months and As needed

## 2015-03-22 NOTE — Progress Notes (Signed)
Subjective:    Patient ID: Martha Mcbride, female    DOB: Nov 23, 1924, 81 y.o.   MRN: [id number]  HPI  81 y.o. never smoker for follow-up of interstitial lung disease presumed IPF, on nocturnal oxygen PMH significant for Polymyalgia on prednisone, severely calcified mitral valve with moderate stenosis, chronic diastolic heart failure,Gangrenous toes on the right foot   Significant tests/ events  admission 10-27/15 -health care associated PNA, encephalopathy, and diastolic HF exacerbation.  Admitted 07/28/14 with fever 103, confusion,productive cough. WBC at 12, Lactic acid at 3.5. Chest x ray -diffuse bilateral pulmonary interstitial infiltrates.& eosinophilia (AEC 1800 on 10/22 which came down to 600 on 10/31). Diarrhea- resolved & felt unlikely to be parasitic by ID - stool O & P neg.   07/30/14 CT: Evidence of emphysema with traction bronchiectasis in multiple areas of interstitial fibrosis. This appearance is consistent with underlying usual interstitial pneumonitis. There also moderate pleural effusions.  04/2012 CT abd >>large hiatal hernia . Prominent interstitial lung markings suggest chronic changes  06/2014 TEE mild MS, mod AS, nml LVfn Ova and parasite stool neg.  Strongyloids antibody neg Quantiferon Tb neg   swallowing evaluation- severe oropharyngeal dysphagia status post PEG placement   Admitted 07/2014 for recurrent aspiration pneumonia, complicated by severe sepsis and decompensated congestive heart failure. Following with wound center with sacral wounds.  08/2014 c -ANCA 1:80, ANA 1:40  09/26/2014 [facility] follow up  Patient returns for a post hospital follow-up. She was admitted November 27 through December 9 for recurrent aspiration pneumonia, complicated by severe sepsis and decompensated congestive heart failure. She was treated with aggressive IV antibiotics and diuresis. Since discharge, she is feeling Chest x-ray today shows no overt pulmonary  edema and chronic interstitial lung disease changes. Has Speech, OT/PT at home  Does have some low grade fevers since discharge  Seen by PCP , dx w/ UTI, finished abx.  Has Peg tube- bolus feeds .  Following with wound center with sacral wounds.  Has loose stools -getting better.  Moving forward -slowly but forward progress.    11/21/2014  Chief Complaint  Patient presents with  . Follow-up    breathing doing well.  no concerns.   She was admitted again 09/2014 for sacral wounds, issues with adrenal insufficiency 10/20/14 venous duplex-bilateral DVT, IVC filter placed CT abdomen 10/16/14-moderate sliding hernia. TEE-moderate aortic stenosis She was discharged to [facility], is wheelchair-bound, has chronic Foley. Is improving with a rehabilitation, sacral wound is healing, prednisone is being tapered by Dr. Amil Amen down to 15 mg- Swallowing has improved, she is on mechanical soft diet in addition to tube feeds, diarrhea has resolved she is not using her oxygen much is able to stay off for long durations She had a fall in the bathroom and bruised her face  03/22/2015 Follow up  Patient returns for a three-month follow-up. Patient is accompanied by her daughter has been doing exceptionally well She has been Jordan from a critical illness. Recently had a PEG tube and was on tube feeds. She is now taking a regular diet and PEG tube is out. He has been working with physical therapy and can now walk on her own, She is out of the rehabilitation center. Previously had a chronic Foley that has now been removed. Previously on oxygen with activity but now does not wear it. Is supposed to be on oxygen at bedtime but has been having family check her oxygen levels with O2 saturations above 90%. We discussed checking a overnight  oximetry chest determine if she needs nocturnal oxygen. She is very pleased with her progress. She denies any chest pain, orthopnea, PND, increased leg swelling, cough,  fever, or increased shortness of breath. He is on chronic steroid-dependent and prednisone is now down to 5 mg daily  Review of Systems neg for any significant sore throat, dysphagia, itching, sneezing, nasal congestion or excess/ purulent secretions, fever, chills, sweats, unintended wt loss, pleuritic or exertional cp, hempoptysis, orthopnea pnd or change in chronic leg swelling. Also denies presyncope, palpitations, heartburn, abdominal pain, nausea, vomiting, diarrhea or change in bowel or urinary habits, dysuria,hematuria, rash, arthralgias, visual complaints, headache, numbness weakness or ataxia.     Objective:   Physical Exam  Gen. Pleasant, and woman in wheelchair, in no distress ENT - no lesions, no post nasal drip Neck: No JVD, no thyromegaly, no carotid bruits Lungs: no use of accessory muscles, no dullness to percussion, bibasal one third rales no rhonchi  Cardiovascular: Rhythm regular, heart sounds  normal, no murmurs or gallops, no peripheral edema Musculoskeletal: No deformities, no cyanosis or clubbing        Assessment & Plan:

## 2015-03-22 NOTE — Telephone Encounter (Signed)
Spoke with patient's daughter, Arville Go, she said that when her mom was in the hospital, Dr. Elsworth Soho mentioned to her that she may need Pulmonary Rehab.  Patient's daughter would like to know if patient needs to have Pulmonary Rehab.  She said that she is still using the Incentive Spirometry that she brought home from the hospital and they are trying to keep her mobile.    RA - please advise.

## 2015-03-22 NOTE — Assessment & Plan Note (Signed)
Compensated without flare Oxygen requirements are decreased Check ONO   Plan  Continue on current regimen  Set up for oxygen test while sleeping .  Chest xray today  Follow with Rheumatology as planned and As needed   Follow up Dr. Elsworth Soho  In 4 months and As needed

## 2015-03-25 ENCOUNTER — Other Ambulatory Visit: Payer: Self-pay

## 2015-03-25 NOTE — Telephone Encounter (Signed)
Pl confirm that she is ambulatory & still on oxygen - if so, will qualify for pulm rehab Ortherwise regular rehab via PCP

## 2015-03-25 NOTE — Progress Notes (Signed)
Reviewed & agree with plan  

## 2015-03-25 NOTE — Telephone Encounter (Signed)
Pt has O2 but has not used it in at least 1 month. They do not feel she needs it. Pt already in regular rehab. Nothing further needed

## 2015-04-29 ENCOUNTER — Telehealth: Payer: Self-pay | Admitting: Adult Health

## 2015-04-29 DIAGNOSIS — R0902 Hypoxemia: Secondary | ICD-10-CM

## 2015-04-29 NOTE — Telephone Encounter (Signed)
lmomtcb x1 for [employer]

## 2015-04-30 NOTE — Telephone Encounter (Signed)
Shows drop in O2 while sleep  Cont on O2 At bedtime

## 2015-04-30 NOTE — Telephone Encounter (Signed)
Pt daughter cb stating adv [facility]c refaxed results I have received results off fax and placed them in TP folder upfront Please call daughter back at previous number listed

## 2015-04-30 NOTE — Telephone Encounter (Signed)
TP please advise on ONO results, I placed them at your desk on b side thanks!

## 2015-04-30 NOTE — Telephone Encounter (Signed)
Spoke with [name] and notified of recs per TP  She verbalized understanding  Nothing further needed

## 2015-04-30 NOTE — Telephone Encounter (Signed)
Spoke with daughter([name]) - states that ONO was done about 2 weeks ago - requesting results.  Please advise Dr Karmen Stabs if results have been received. Thanks.

## 2015-05-20 ENCOUNTER — Encounter: Payer: Self-pay | Admitting: Adult Health

## 2015-07-20 IMAGING — CT CT CHEST W/O CM
2 of 4 series · 14 of 36 positions shown, 17 images · non-contrast
Comparison: Chest radiograph August 08, 2014

CLINICAL DATA: Fever and elevated white blood cell count. Chronic
shortness of breath and chest pain. History of breast carcinoma in
8444

EXAM:
CT CHEST WITHOUT CONTRAST
TECHNIQUE: Multidetector CT imaging of the chest was performed following the
standard protocol without IV contrast material administration.

[Series 2: rtn chest without st · axial · non-contrast · 0.61mm/px · z∈[+1018,+1283]mm · 11 of 63 slices shown, 14 images]
[im 5/63  mediastinal]
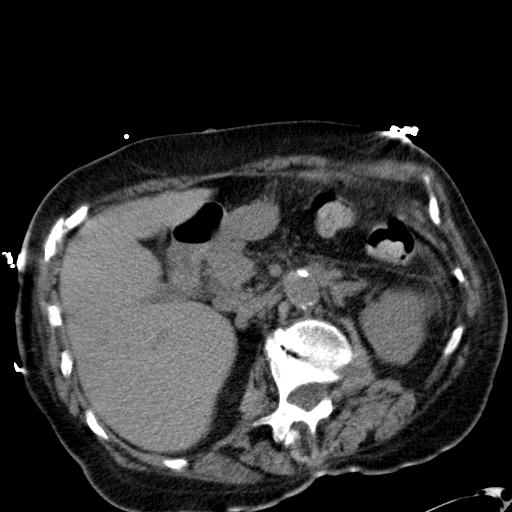
[im 5/63  lung]
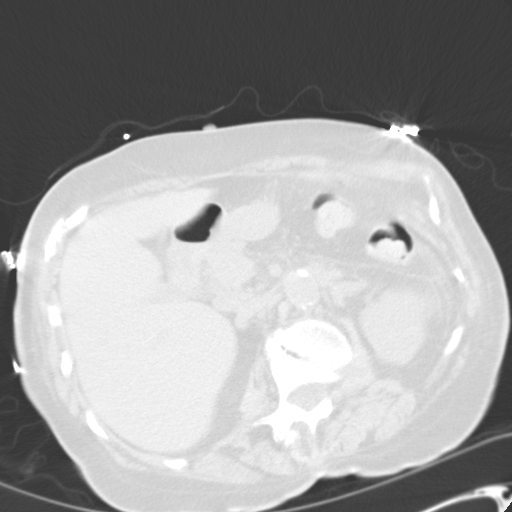
[im 10/63  lung]
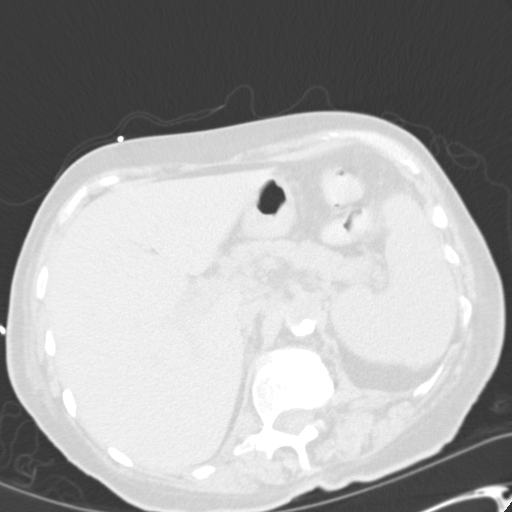
[im 15/63  lung]
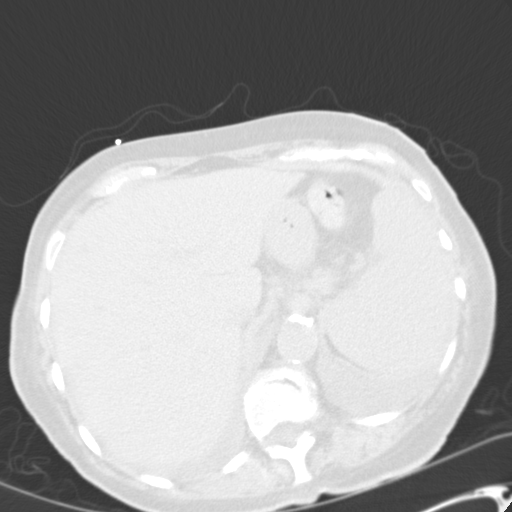
[im 20/63  lung]
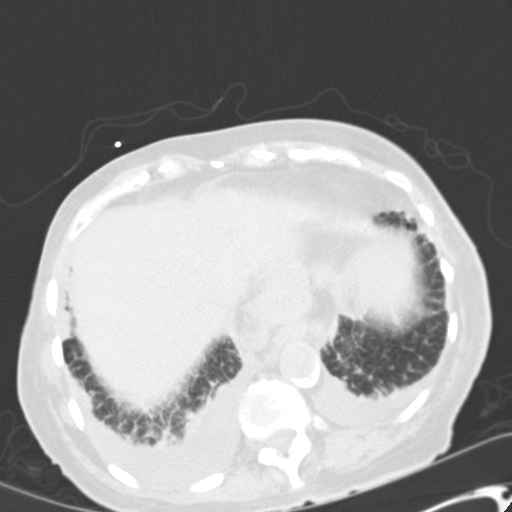
[im 24/63  mediastinal]
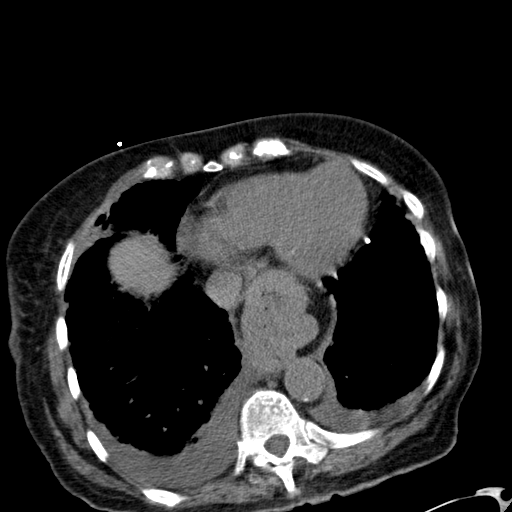
[im 24/63  lung]
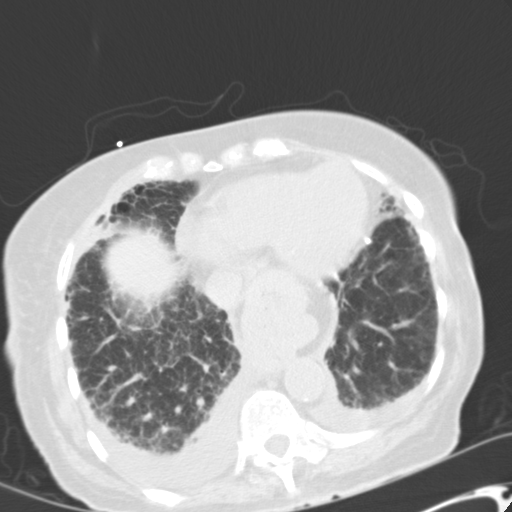
[im 34/63  lung]
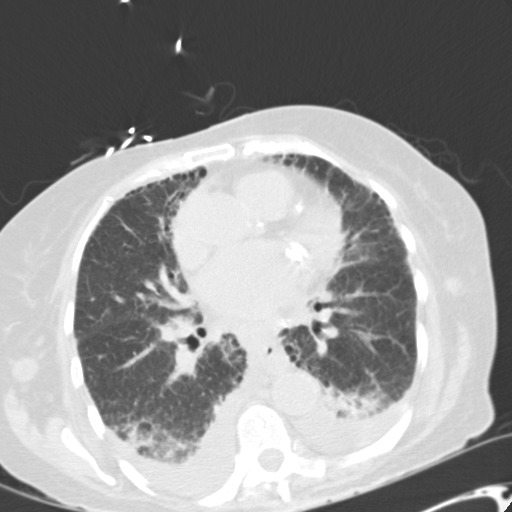
[im 39/63  lung]
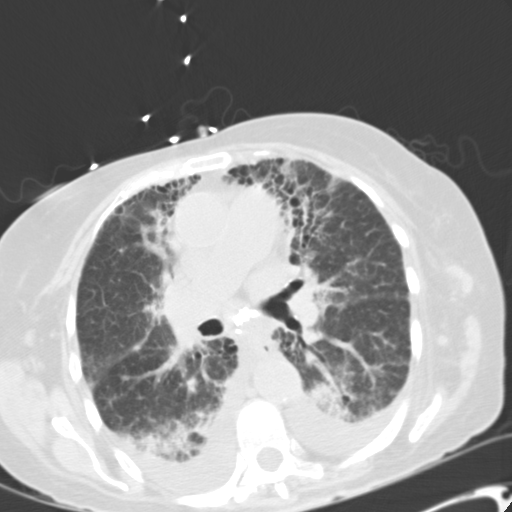
[im 43/63  lung]
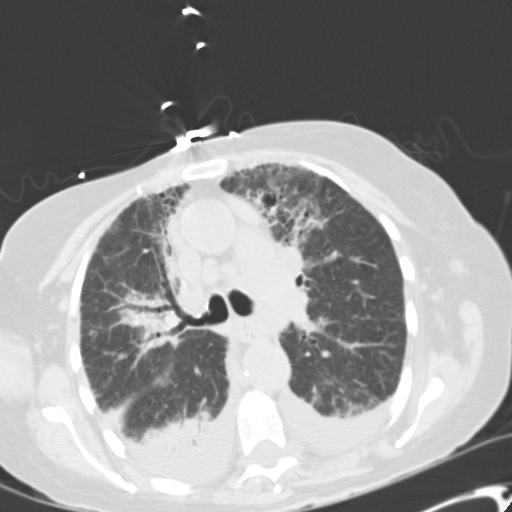
[im 48/63  mediastinal]
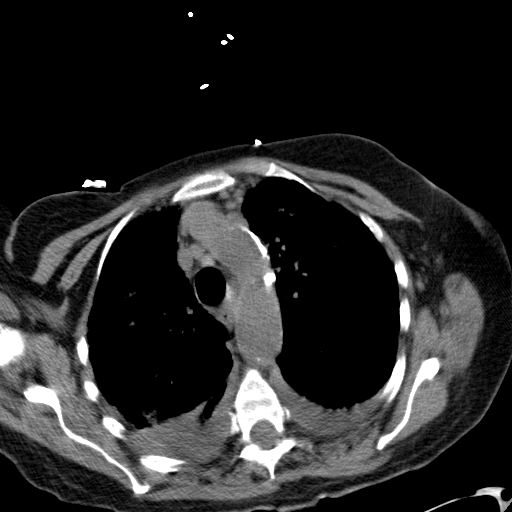
[im 48/63  lung]
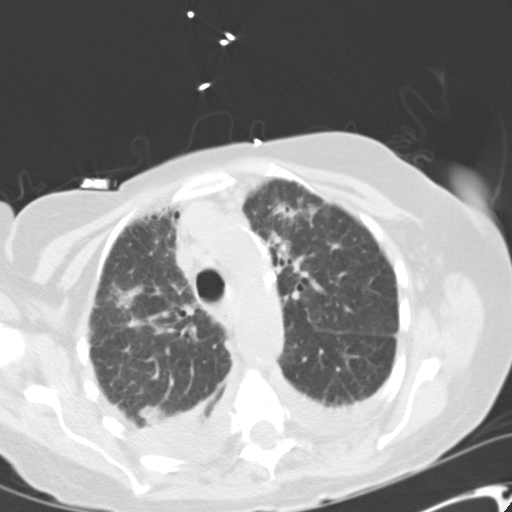
[im 53/63  lung]
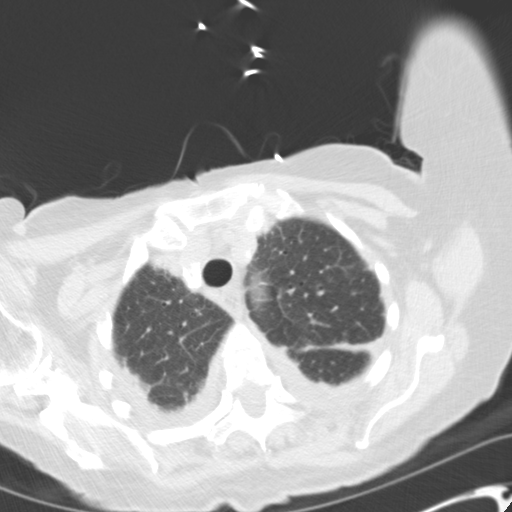
[im 58/63  lung]
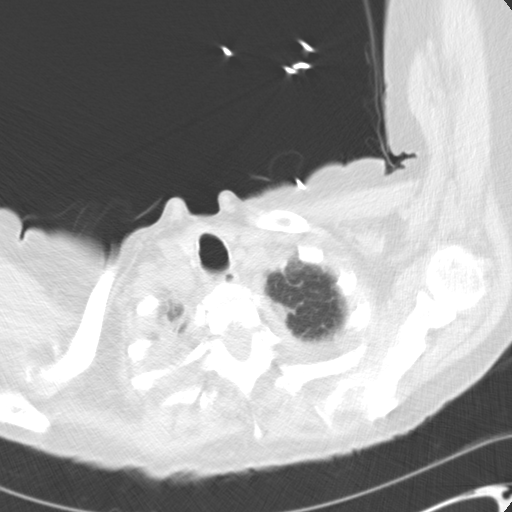

[Series 602: coronal images · coronal · 0.62mm/px · 3 of 86 slices shown]
[im 18/86  lung]
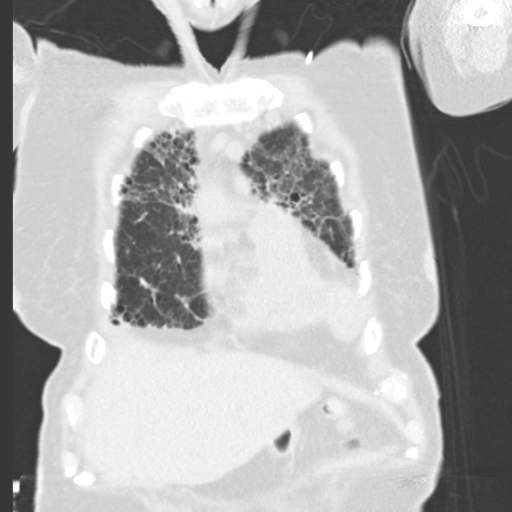
[im 35/86  lung]
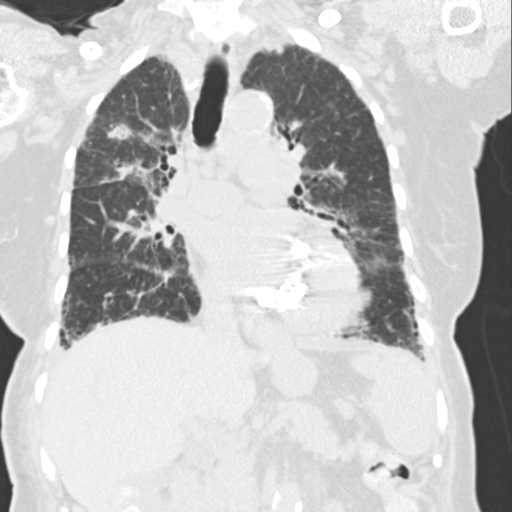
[im 52/86  lung]
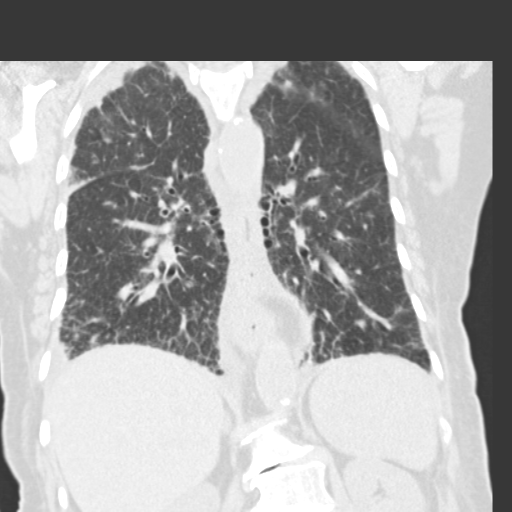

[14 of 36 positions shown; findings below may reference images not displayed]

FINDINGS: There is underlying emphysematous change with interstitial fibrosis
and honeycombing throughout both upper lobes, primarily medially as
well as in the lingula, right middle lobe, and both lower lobes.
There is diffuse traction type bronchiectasis.

There are moderate free-flowing effusions bilaterally. There are
areas of consolidation in both lower lobes in a somewhat patchy
distribution. Lesser degrees of airspace consolidation are noted in
both upper lobes.

On axial slice 32 series 5, there is a 5 mm nodular opacity in the
posterior segment of the right upper lobe.

There is atherosclerotic change in the aorta but no aneurysm. No
pulmonary embolus is seen on this noncontrast enhanced study. There
is extensive calcification in the mitral valve and aortic valve
regions.

There are multiple small lymph nodes throughout the mediastinum.
There is a lymph node in the anterior precarinal region measuring
1.6 by 1.4 cm. A second lymph node in this area measures 2.0 x
cm. A lymph node in the aortopulmonary window region measures 1.5 x
1.3 cm.

There is a moderate hiatal hernia.

In the visualized upper abdominal region, there is atherosclerotic
change in the aorta. There is cholelithiasis. There is a small cyst
in the posterior segment of the right lobe of the liver.

There is degenerative change in the thoracic spine with increase in
kyphosis. There are no blastic or lytic bone lesions. Thyroid
appears unremarkable.
IMPRESSION: Evidence of emphysema with traction bronchiectasis in multiple areas
of interstitial fibrosis. This appearance is consistent with
underlying usual interstitial pneumonitis.

There are areas of patchy consolidation in both lower lobes as well
as to a lesser extent in the upper lobes. There also moderate
pleural effusions. The areas of consolidation are felt to most
likely represent superimposed pneumonia. There may be a degree of
superimposed congestive heart failure as well given the pleural
effusions.

5 mm nodular opacity in the posterior segment of the right upper
lobe. Followup of this nodular opacity should be based on [employer]
Society guidelines. If the patient is at high risk for bronchogenic
carcinoma, follow-up chest CT at 6-12 months is recommended. If the
patient is at low risk for bronchogenic carcinoma, follow-up chest
CT at 12 months is recommended. This recommendation follows the
consensus statement: Guidelines for Management of Small Pulmonary
Nodules Detected on CT Scans: A Statement from the [employer]

Areas of atherosclerotic change and cardiac valvular calcification.

Areas of relatively mild adenopathy.

Cholelithiasis.

Given the areas of questionable pneumonia, it may be prudent to
consider a followup study in 2-3 weeks to assess for clearing.

## 2015-07-27 IMAGING — XA IR PERC PLACEMENT GASTROSTOMY
1 series · 4 of 4 positions shown · non-contrast
Comparison: none

CLINICAL DATA: 89-year-old female with dysphagia and protein
calorie malnutrition. She has a large hiatal hernia. Placement of a
percutaneous gastrostomy tube is warranted for nutrition.
TECHNIQUE: Informed consent was obtained from the patient following explanation
of the procedure, risks, benefits and alternatives. The patient
understands, agrees and consents for the procedure. All questions
were addressed. A time out was performed.

[Series 300: tube placements · 4 of 4 slices shown]
[im 1/4]
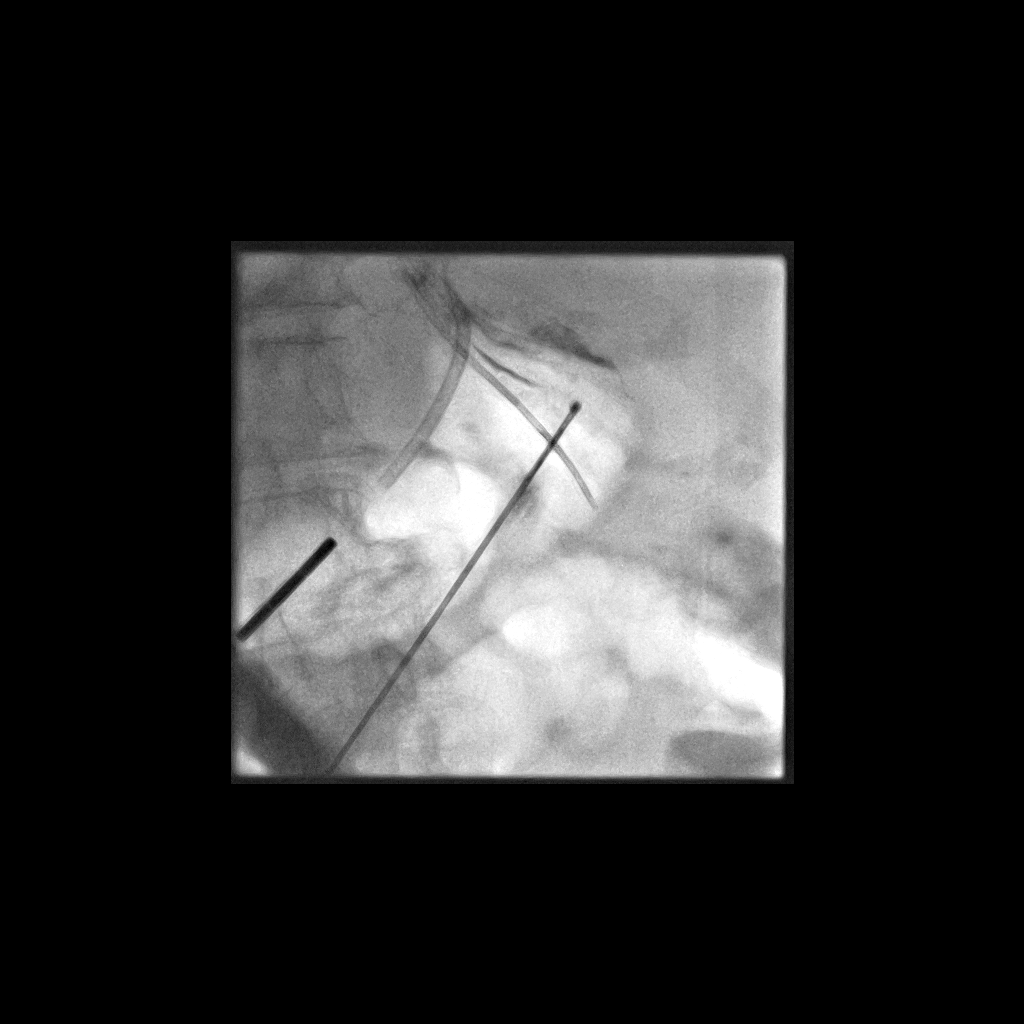
[im 2/4]
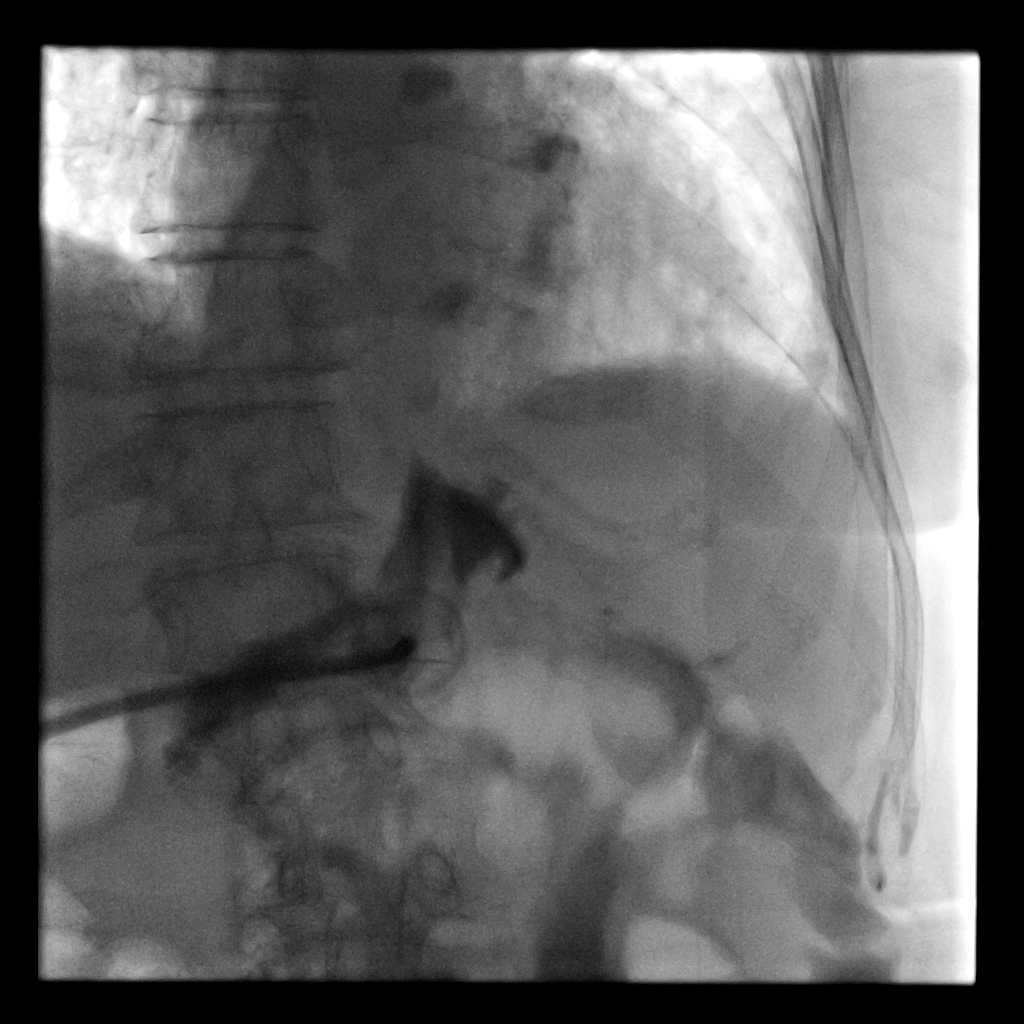
[im 3/4]
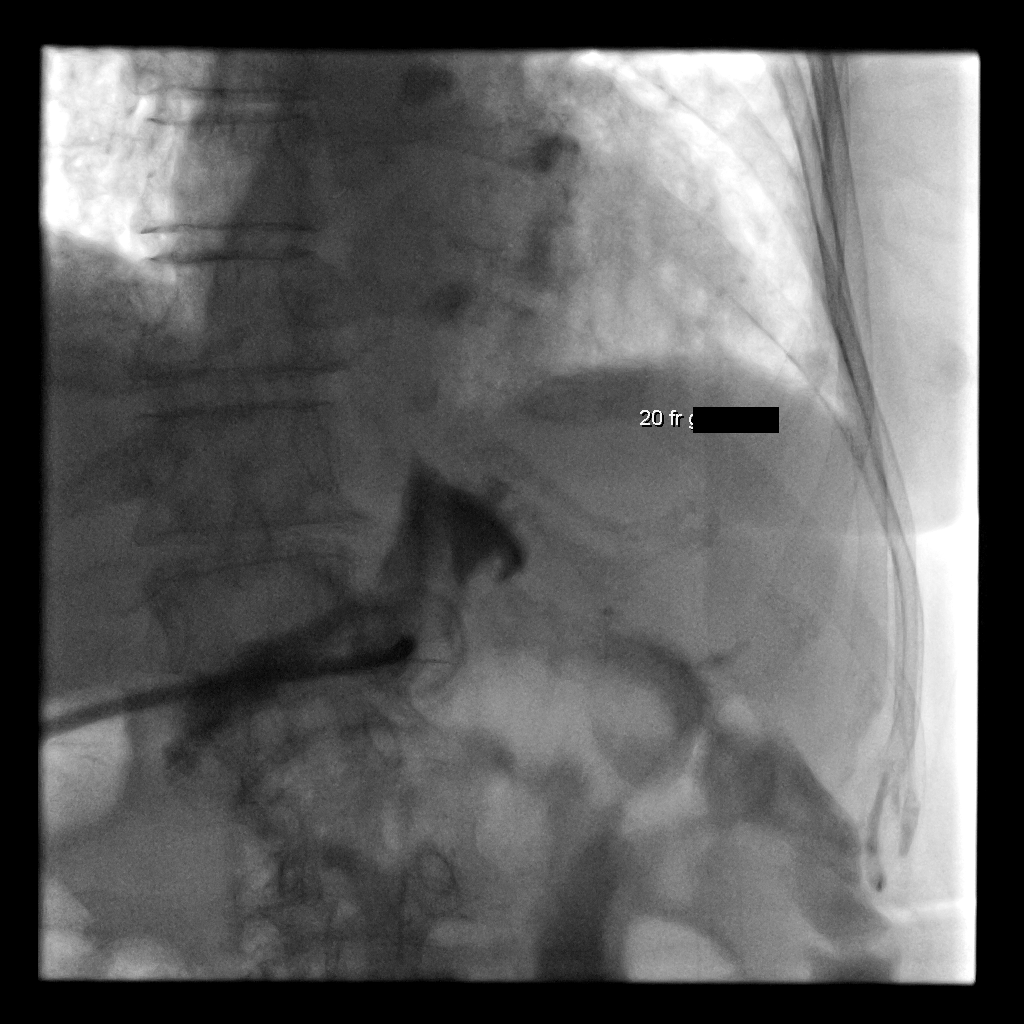
[im 4/4]
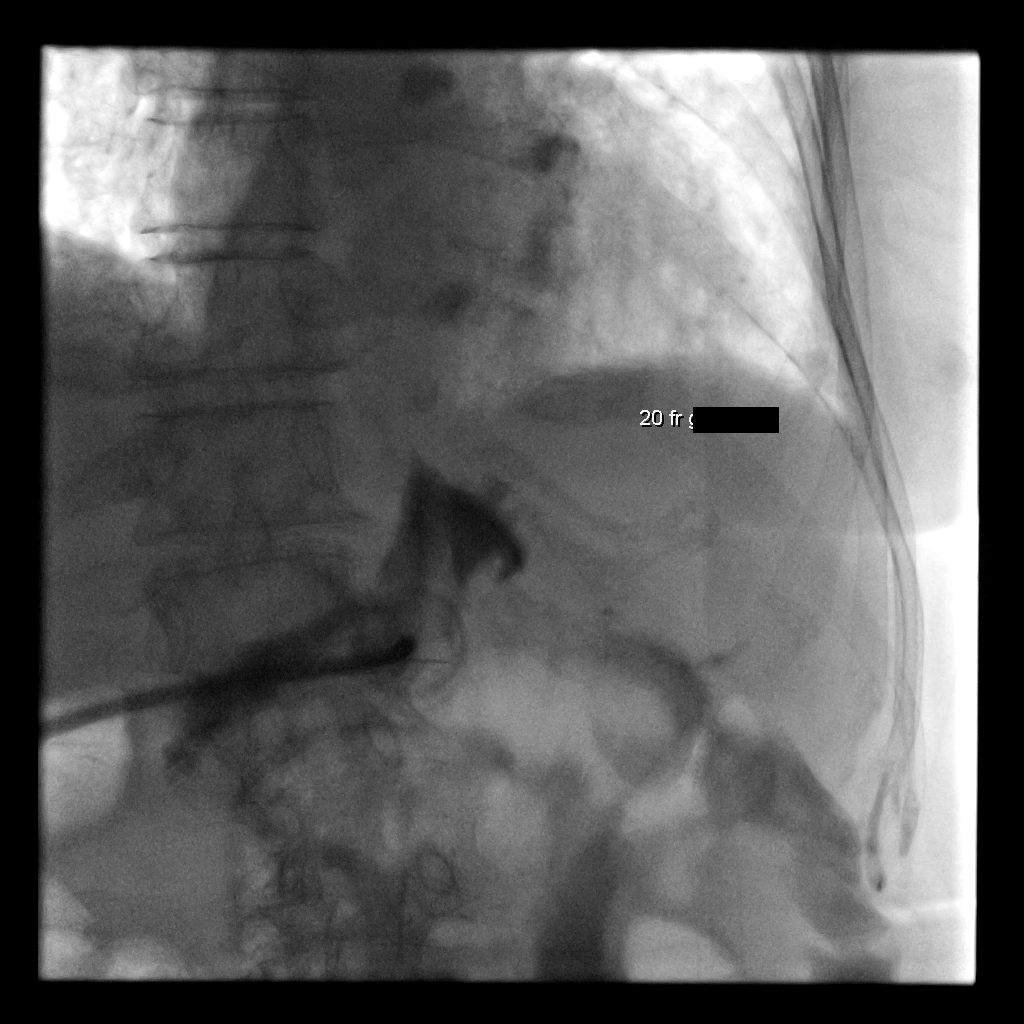

[4 of 4 positions shown; findings below may reference images not displayed]

EXAM:
PERC PLACEMENT GASTROSTOMY

Date: 08/06/2014

PROCEDURE:
1. Fluoroscopically guided placement of percutaneous pull-through
gastrostomy tube.

ANESTHESIA/SEDATION:
Moderate (conscious) sedation was used. 1.5 mg Versed, 50 mcg
Fentanyl were administered intravenously. The patient's vital signs
were monitored continuously by radiology nursing throughout the
procedure.

Sedation Time: 26 minutes

FLUOROSCOPY TIME:  7 min 24 seconds (71 mGy)

CONTRAST:  10 mL Omnipaque 3

MEDICATIONS:
1 g vancomycin was administered intravenously within 1 hour of skin
incision.
Maximal barrier sterile technique utilized including caps, mask,
sterile gowns, sterile gloves, large sterile drape, hand hygiene,
and chlorhexadine skin prep.

An angled catheter was advanced over a wire under fluoroscopic
guidance through the nose, down the esophagus and into the body of
the stomach. The stomach was then insufflated with several 100 ml of
air. Fluoroscopy confirmed location of the gastric bubble, as well
as inferior displacement of the barium stained colon. Under direct
fluoroscopic guidance, a single T-tack was placed, and the anterior
gastric wall drawn up against the anterior abdominal wall.
Percutaneous access was then obtained into the mid gastric body with
an 18 gauge sheath needle. Aspiration of air, and injection of
contrast material under fluoroscopy confirmed needle placement.

An Amplatz wire was advanced in the gastric body and the access
needle exchanged for a 9-French vascular sheath. A snare device was
advanced through the vascular sheath and an Amplatz wire advanced
through the angled catheter. The Amplatz wire was successfully
snared and this was pulled up through the esophagus and out the
mouth. A 20-Wing Silguero tube was then connected to
the snare and pulled through the mouth, down the esophagus, into the
stomach and out to the anterior abdominal wall. Hand injection of
contrast material confirmed intragastric location. The T-tack
retention suture was then cut. The pull through peg tube was then
secured with the external bumper and capped.

The patient will be observed for several hours with the newly placed
tube on low wall suction to evaluate for any post procedure
complication. The patient tolerated the procedure well, there is no
immediate complication.
IMPRESSION: Successful placement of a 20 French pull through gastrostomy tube.

## 2015-09-04 IMAGING — CR DG CHEST 2V
2 series · 2 of 2 positions shown · non-contrast
Comparison: Radiograph 12 5,015

CLINICAL DATA: Recurrent or persistent pneumonia x few weeks. Pt
has fever, cough and shortness of breath. Pt was recently admitted
in the hospital for 13 days for aspiration pneumonia. Weakness in
entire body.

EXAM:
CHEST  2 VIEW

[w chest pa (1 of 2)]
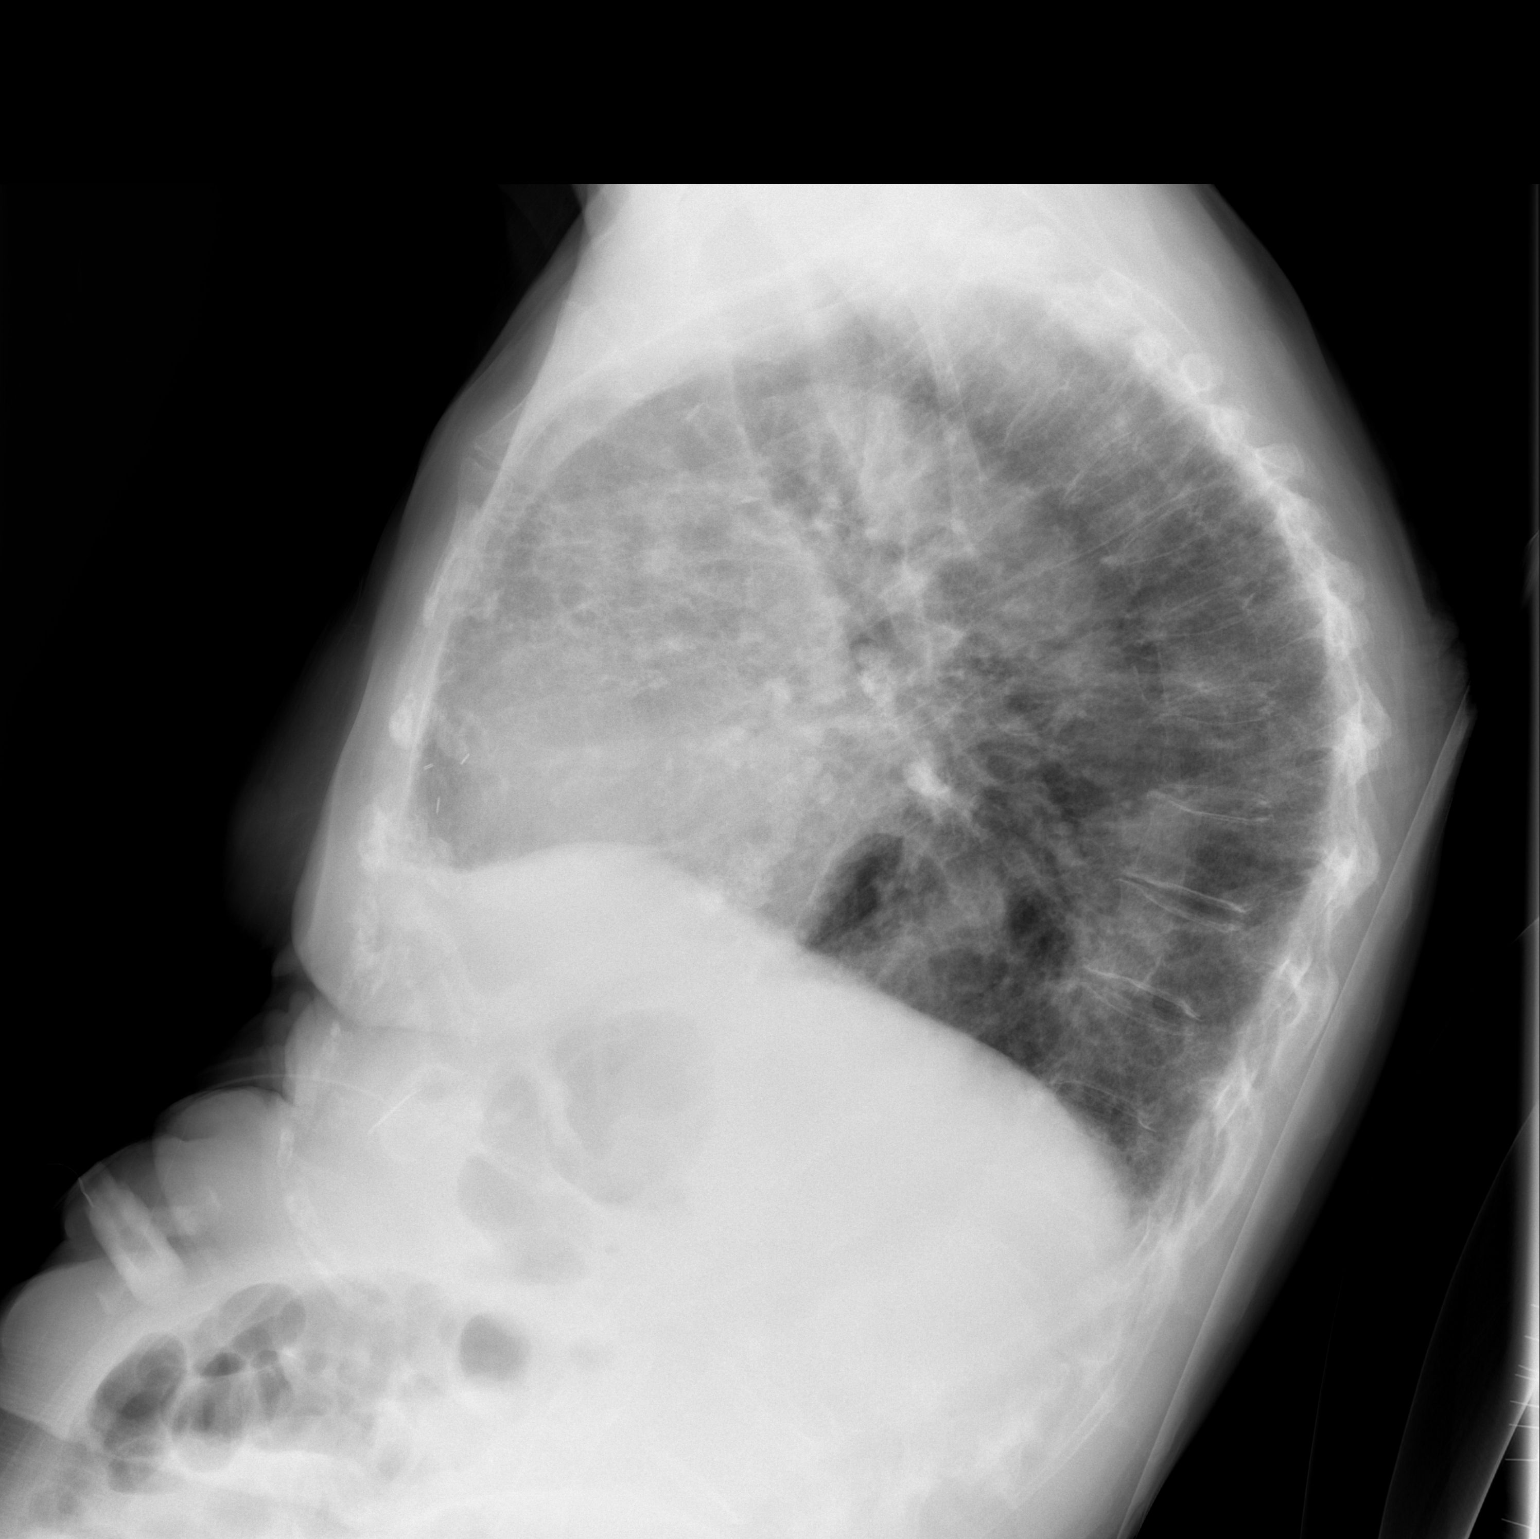

[w chest pa (2 of 2)]
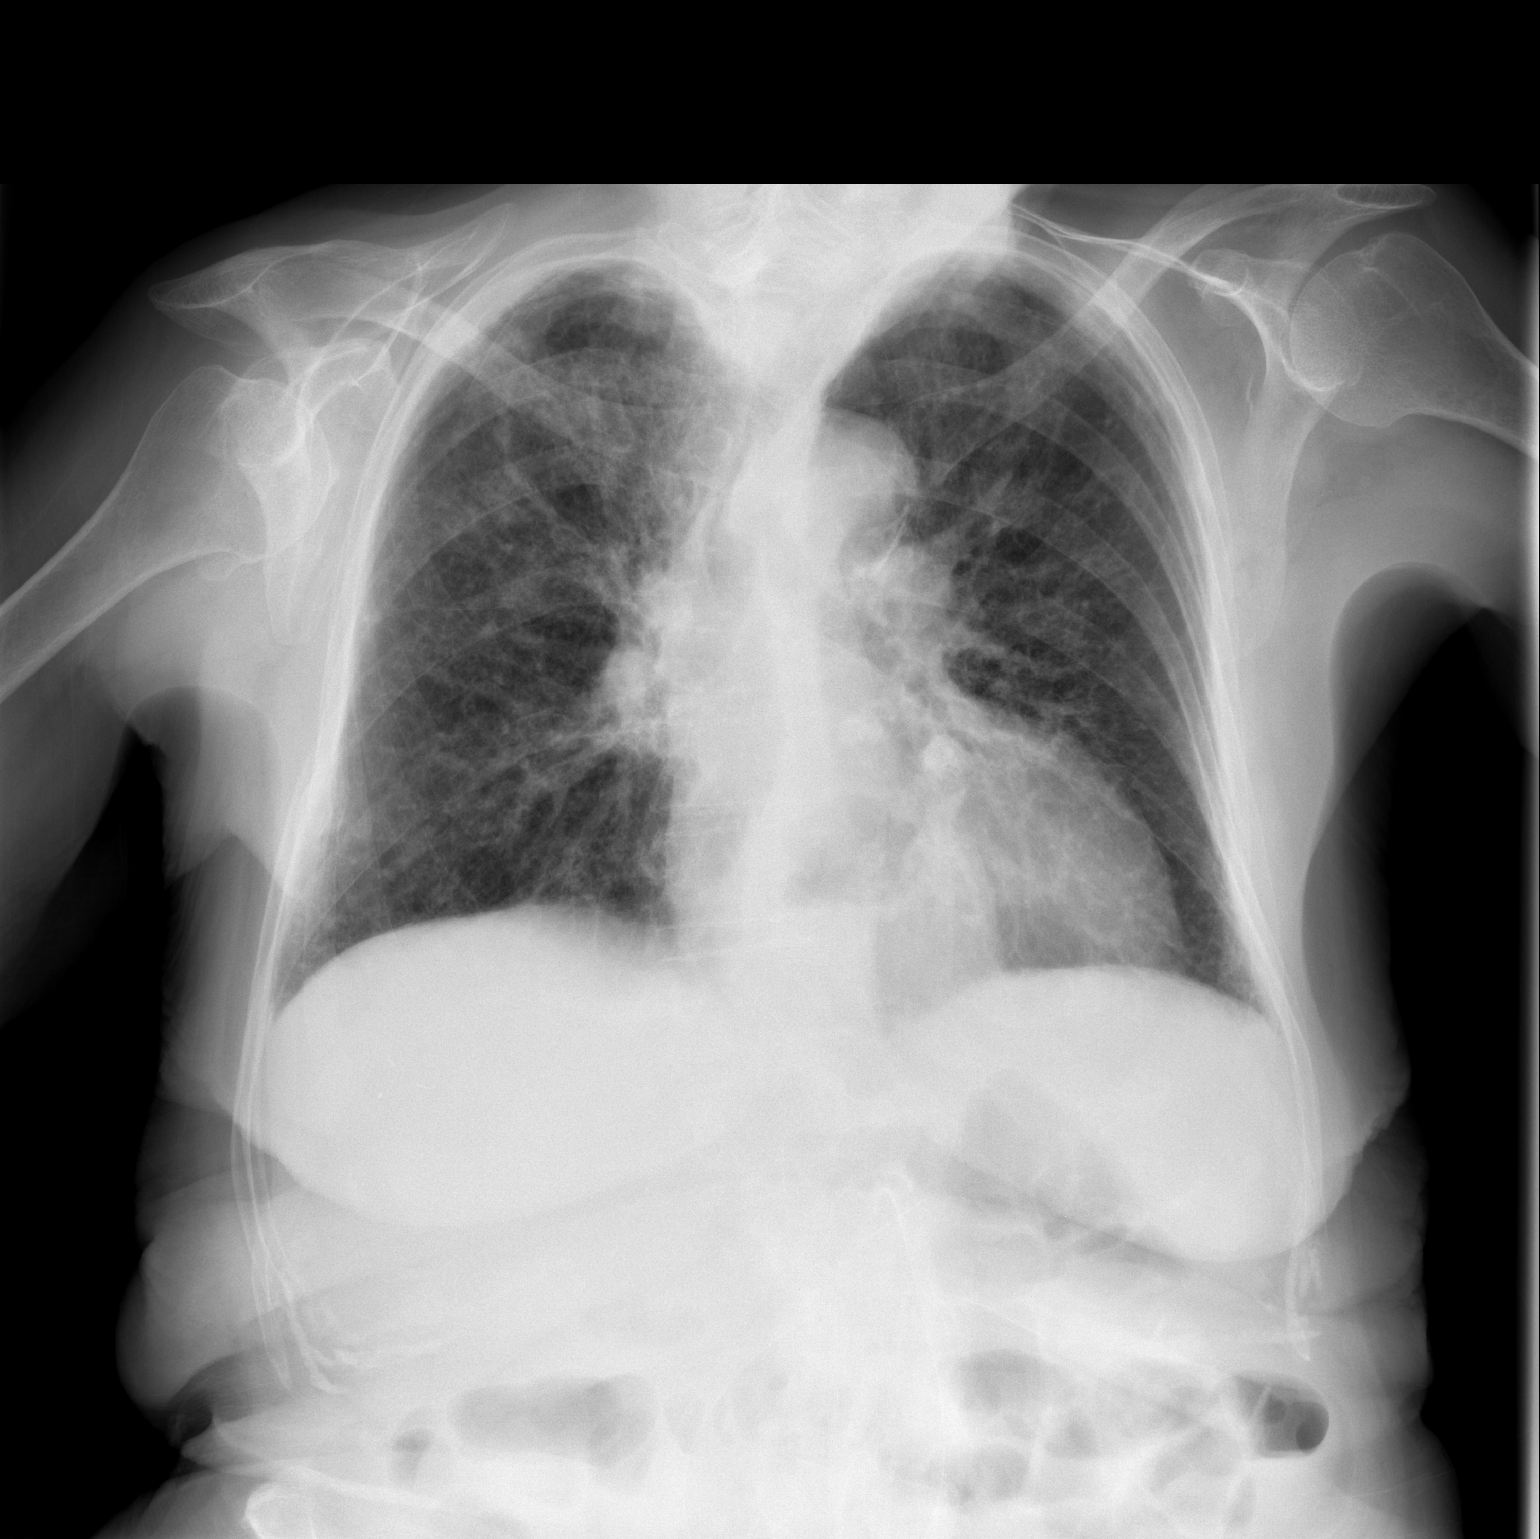

[2 of 2 positions shown; findings below may reference images not displayed]

FINDINGS: Mildly enlarged cardiac silhouette with ectatic aorta. There is
chronic interstitial lung disease. This peribronchial thickening.
There is improvement in airspace disease in the right upper lobe
seen on comparison exam. No new airspace disease. Lungs are
hyperinflated.

:
IMPRESSION: 1. Some improvement in right upper lobe airspace disease.
2. Chronic peribronchial thickening interstitial lung disease.
3. No clear evidence of new infiltrate. Cannot exclude bronchitis
centrally.

## 2015-09-09 ENCOUNTER — Other Ambulatory Visit: Payer: Self-pay | Admitting: Internal Medicine

## 2015-09-09 ENCOUNTER — Ambulatory Visit
Admission: RE | Admit: 2015-09-09 | Discharge: 2015-09-09 | Disposition: A | Discharge status: Home / Self Care | Payer: Medicare Other | Source: Ambulatory Visit | Attending: Internal Medicine | Admitting: Internal Medicine

## 2015-09-09 ENCOUNTER — Other Ambulatory Visit: Payer: Medicare Other

## 2015-09-09 DIAGNOSIS — M25552 Pain in left hip: Secondary | ICD-10-CM

## 2015-09-12 ENCOUNTER — Ambulatory Visit: Payer: Medicare Other | Admitting: Cardiology

## 2015-10-21 ENCOUNTER — Telehealth: Payer: Self-pay | Admitting: Family Medicine

## 2015-10-21 ENCOUNTER — Ambulatory Visit (INDEPENDENT_AMBULATORY_CARE_PROVIDER_SITE_OTHER): Payer: Medicare Other | Admitting: Family Medicine

## 2015-10-21 ENCOUNTER — Ambulatory Visit (INDEPENDENT_AMBULATORY_CARE_PROVIDER_SITE_OTHER): Payer: Medicare Other

## 2015-10-21 VITALS — BP 128/80 | HR 76 | Temp 98.3°F | Resp 16 | Ht 66.0 in | Wt 136.6 lb

## 2015-10-21 DIAGNOSIS — S52121A Displaced fracture of head of right radius, initial encounter for closed fracture: Secondary | ICD-10-CM

## 2015-10-21 DIAGNOSIS — S0993XA Unspecified injury of face, initial encounter: Secondary | ICD-10-CM

## 2015-10-21 DIAGNOSIS — M25521 Pain in right elbow: Secondary | ICD-10-CM | POA: Diagnosis not present

## 2015-10-21 DIAGNOSIS — S0181XA Laceration without foreign body of other part of head, initial encounter: Secondary | ICD-10-CM | POA: Diagnosis not present

## 2015-10-21 MED ORDER — HYDROCODONE-ACETAMINOPHEN 5-325 MG PO TABS: 1.0000 | ORAL_TABLET | Freq: Four times a day (QID) | ORAL | Status: DC | PRN

## 2015-10-21 NOTE — Progress Notes (Signed)
Risk and benefits discussed and verbal consent obtained. Anesthetic allergies reviewed. Patient anesthetized using 1:1 mix of 2% lidocaine with epi and Marcaine. The wound was cleansed thoroughly with soap and water. Sterile prep and drape. Wound closed with 4 throws using 6-0 Ethilon suture material. Hemostasis achieved. Mupirocin applied to the wound and bandage placed. The patient tolerated well. Wound instructions were provided and the patient is to return in 6 days for suture removal. Philis Fendt, MS, PA-C 1:16 PM, 10/21/2015

## 2015-10-21 NOTE — Telephone Encounter (Signed)
Patient's daughter states that her mother received an x-ray disc that only shows her mother's facial injuries. They need a new disc showing her elbow injuries as well. The patient has an appointment with a specialist at 7:45am tomorrow (10/21/2015). Patient daughter would like to pick up the new x-ray disc tonight. Please advise.  [phone]

## 2015-10-21 NOTE — Progress Notes (Signed)
Subjective:  This chart was scribed for Robyn Haber MD, by Tamsen Roers, at [facility].  This patient was seen in room 7 and the patient's care was started at 12:30 PM.   Chief Complaint  Patient presents with  . Facial Laceration    left eyebrow/ pt fell this morning  . Arm Pain    rt arm is in pain, pt can't extend it.  . Knee Pain    rt/ x today     Patient ID: Martha Mcbride, female    DOB: 08-31-1925, 82 y.o.   MRN: [id number]  HPI  HPI Comments: Martha Mcbride is a 82 y.o. female who presents to the Emergency Department complaining of a laceration on her left eyebrow after falling while exercising in her silver sneakers program at the [facility]. Patient states that her foot caught something on the ground which caused her to fall.  She also has right elbow and right knee pain.  Patient is able to move her arm only to a certain degree before she starts having pain.  Patient notes that she hit her head when she feel but denies any LOC or double vision.     Patient Active Problem List   Diagnosis Date Noted  . DVT (deep venous thrombosis) ([place])   . Protein-calorie malnutrition, severe ([place]) 10/18/2014  . Diabetic hyperosmolar non-ketotic state ([place])   . Decubitus skin ulcer   . Hyperglycemia 10/17/2014  . Severe sepsis ([place]) 10/16/2014  . New onset type 2 diabetes mellitus ([place]) 10/16/2014  . UTI (lower urinary tract infection) 10/16/2014  . Sacral wound 10/16/2014  . Aspiration into respiratory tract   . Fever presenting with conditions classified elsewhere   . Dysphagia   . Failure to thrive in adult   . Blood poisoning ([place])   . Aspiration into airway   . Malnutrition of moderate degree ([place]) 07/30/2014  . Pulmonary infiltrate   . IPF (idiopathic pulmonary fibrosis) ([place]) 07/29/2014  . Chronic diastolic heart failure ([place])   . Dyspnea   . Pyrexia   . Hypoxemia   . Sepsis ([place]) 07/28/2014  . Diastolic HF (heart failure) ([place]) 07/28/2014    . Acute on chronic diastolic CHF (congestive heart failure) ([place]) 07/21/2014  . Fever 07/20/2014  . Aortic stenosis 06/03/2014  . Chest pain 06/02/2014  . Heart murmur 06/02/2014  . Hyponatremia 06/02/2014  . Immune disorder ([place]) 06/02/2014   Past Medical History  Diagnosis Date  . Arthritis   . Polymyalgia ([place])   . Acid reflux   . Immune deficiency disorder (H[facility])     autoimmune disorder - polymyalgia - on prednisone  . Breast cancer ([place])     Treated with lumpectomy and radiation  . Giardia   . Pinworms   . History of hiatal hernia   . Pneumonia   . Dementia   . CHF (congestive heart failure) ([place])   . Dysrhythmia   . Diabetes mellitus without complication [facility])    Past Surgical History  Procedure Laterality Date  . Breast lumpectomy    . Partial hysterectomy    . Tonsillectomy    . Tee without cardioversion N/A 07/20/2014    Procedure: TRANSESOPHAGEAL ECHOCARDIOGRAM (TEE);  Surgeon: Candee Furbish, MD;  Location: [facility] ENDOSCOPY;  Service: Cardiovascular;  Laterality: N/A;  . Abdominal hysterectomy     Allergies  Allergen Reactions  . Doxycycline Nausea And Vomiting  . Other Nausea And Vomiting and Other (See Comments)    Anesthesia  makes her very sleepy and makes it hard for her to come out of it.   Marland Kitchen Penicillins Hives and Nausea And Vomiting  . Sulfa Antibiotics Nausea And Vomiting   Prior to Admission medications   Medication Sig Start Date End Date Taking? Authorizing Provider  aspirin 81 MG chewable tablet Place 1 tablet (81 mg total) into feeding tube daily. 08/10/14  Yes Charlynne Cousins, MD  folic acid (FOLVITE) 1 MG tablet Place 1 tablet (1 mg total) into feeding tube daily. 08/10/14  Yes Charlynne Cousins, MD  furosemide (LASIX) 20 MG tablet Take 1 tablet by mouth 2 (two) times daily. 03/11/15  Yes Historical Provider, MD  hydroxypropyl methylcellulose / hypromellose (ISOPTO TEARS / GONIOVISC) 2.5 % ophthalmic solution Place 1 drop into both eyes daily as  needed for dry eyes.    Yes Historical Provider, MD  insulin aspart (NOVOLOG) 100 UNIT/ML injection Order Questions    Question Answer Comment   Correction coverage: Moderate (average weight, post-op)    CBG < 70: implement hypoglycemia protocol    CBG 70 - 120: 0 units    CBG 121 - 150: 2 units    CBG 151 - 200: 3 units    CBG 201 - 250: 5 units    CBG 251 - 300: 8 units    CBG 301 - 350: 11 units    CBG 351 - 400: 15 units    CBG > 400 call MD and obtain STAT lab verification    10/26/14  Yes Reyne Dumas, MD  Levothyroxine Sodium (SYNTHROID PO) Take 25 mcg by mouth daily.    Yes Historical Provider, MD  metoprolol tartrate (LOPRESSOR) 25 MG tablet Take 1 tablet by mouth 2 (two) times daily. 12/14/14  Yes Historical Provider, MD  potassium chloride 20 MEQ/15ML (10%) SOLN Place 15 mLs (20 mEq total) into feeding tube daily. Patient taking differently: Take 20 mEq by mouth daily.  08/09/14  Yes Charlynne Cousins, MD  predniSONE (DELTASONE) 5 MG tablet Place 3 tablets (15 mg total) into feeding tube daily with breakfast. Patient taking differently: Take 15 mg by mouth daily with breakfast.  10/26/14  Yes Reyne Dumas, MD  saccharomyces boulardii (FLORASTOR) 250 MG capsule 250mg  per feeding tube two times a day Patient taking differently: Take 250 mg by mouth 2 (two) times daily.  10/26/14  Yes Reyne Dumas, MD   Social History   Social History  . Marital Status: Widowed    Spouse Name: N/A  . Number of Children: 4  . Years of Education: N/A   Occupational History  . Not on file.   Social History Main Topics  . Smoking status: Never Smoker   . Smokeless tobacco: Never Used  . Alcohol Use: No  . Drug Use: No  . Sexual Activity: Not on file   Other Topics Concern  . Not on file   Social History Narrative     Review of Systems  Constitutional: Negative for fever and chills.  Eyes: Negative for pain, redness and itching.  Respiratory: Negative for  cough and choking.   Gastrointestinal: Negative for nausea and vomiting.  Musculoskeletal: Negative for neck pain and neck stiffness.  Skin: Positive for wound.  Neurological: Negative for seizures, syncope and speech difficulty.       Objective:   Physical Exam  Constitutional: She is oriented to person, place, and time. No distress.  HENT:  Head: Normocephalic.  No battle sign.   Eyes: EOM are normal.  Pupils are equal, round, and reactive to light.  She has ecchymosis around the left orbit along with a 2 cm laceration around the eyebrow.  Pulmonary/Chest: Effort normal. No respiratory distress.  Musculoskeletal:  She has an abrasion just superior to her patella with  Full range of motion of her right knee, no effusion.    Neurological: She is alert and oriented to person, place, and time.  Skin: Skin is warm and dry.  Psychiatric: She has a normal mood and affect. Her behavior is normal.    Filed Vitals:   10/21/15 1220  BP: 128/80  Pulse: 76  Temp: 98.3 F (36.8 C)  TempSrc: Oral  Resp: 16  Height: 5\' 6"  (1.676 m)  Weight: 136 lb 9.6 oz (61.961 kg)  SpO2: 97%   [facility] (PRIMARY) x-ray report read by Dr. Joseph Art MD,  nondisplaced fracture of the right radial head.  Orbital x ray: orbits are normal    Assessment & Plan:   This chart was scribed in my presence and reviewed by me personally.    ICD-9-CM ICD-10-CM   1. Facial trauma, initial encounter 959.09 S09.93XA DG Orbits     HYDROcodone-acetaminophen (NORCO) 5-325 MG tablet  2. Elbow pain, right 719.42 M25.521 DG Elbow Complete Right     HYDROcodone-acetaminophen (NORCO) 5-325 MG tablet  3. Radial head fracture, right, closed, initial encounter 813.05 S52.121A Ambulatory referral to Orthopedic Surgery     HYDROcodone-acetaminophen (NORCO) 5-325 MG tablet     Signed, Robyn Haber, MD

## 2015-10-22 NOTE — Telephone Encounter (Signed)
Spoke with daughter and disc has been made and p/u last night 10/21/15.

## 2015-10-31 ENCOUNTER — Ambulatory Visit (INDEPENDENT_AMBULATORY_CARE_PROVIDER_SITE_OTHER): Payer: Medicare Other | Admitting: Urgent Care

## 2015-10-31 VITALS — BP 102/60 | HR 82 | Temp 98.1°F | Resp 18 | Ht 59.5 in | Wt 138.0 lb

## 2015-10-31 DIAGNOSIS — Z4802 Encounter for removal of sutures: Secondary | ICD-10-CM

## 2015-10-31 DIAGNOSIS — S0181XD Laceration without foreign body of other part of head, subsequent encounter: Secondary | ICD-10-CM

## 2015-10-31 NOTE — Progress Notes (Signed)
    MRN: [id number] DOB: 10-08-1924  Subjective:   Martha Mcbride is a 82 y.o. female presenting for Suture / Staple Removal  Patient had sutures placed 10/21/2015 and had 3 sutures placed over her left eyebrow. She denies fever, tenderness, drainage of pus or bleeding.  Imagine has a current medication list which includes the following prescription(s): aspirin, folic acid, furosemide, hydrocodone-acetaminophen, hydroxypropyl methylcellulose / hypromellose, insulin aspart, levothyroxine sodium, metoprolol tartrate, potassium chloride, prednisone, and saccharomyces boulardii. Also is allergic to doxycycline; other; penicillins; and sulfa antibiotics.  Martha Mcbride  has a past medical history of Arthritis; Polymyalgia ([place]); Acid reflux; Immune deficiency disorder ([place]); Breast cancer ([place]); Giardia; Pinworms; History of hiatal hernia; Pneumonia; Dementia; CHF (congestive heart failure) ([place]); Dysrhythmia; and Diabetes mellitus without complication ([place]). Also  has past surgical history that includes Breast lumpectomy; Partial hysterectomy; Tonsillectomy; TEE without cardioversion (N/A, 07/20/2014); and Abdominal hysterectomy.  Objective:   Vitals: BP 102/60 mmHg  Pulse 82  Temp(Src) 98.1 F (36.7 C) (Oral)  Resp 18  Ht 4' 11.5" (1.511 m)  Wt 138 lb (62.596 kg)  BMI 27.42 kg/m2  SpO2 98%  Physical Exam  Constitutional: She is oriented to person, place, and time. She appears well-developed and well-nourished.  HENT:  Head:    Cardiovascular: Normal rate.   Pulmonary/Chest: Effort normal.  Neurological: She is alert and oriented to person, place, and time.  Skin: Skin is warm and dry.   Suture Removal: 3 sutures removed without incident. There was no wound dehiscence.   Assessment and Plan :   1. Encounter for removal of sutures 2. Facial laceration, subsequent encounter - Stable, sutures removed without incident. RTC as needed.  Jaynee Eagles, PA-C [facility] [phone] 10/31/2015 12:44 PM

## 2015-10-31 NOTE — Progress Notes (Signed)
   Subjective:    Patient ID: Martha Mcbride, female    DOB: 12-12-1924, 82 y.o.   MRN: [id number] By signing my name below, I, Zola Button, attest that this documentation has been prepared under the direction and in the presence of Robyn Haber, MD.  Electronically Signed: Zola Button, Medical Scribe. 10/31/2015. 12:33 PM.  HPI HPI Comments: Martha Mcbride is a 82 y.o. female who presents to the [facility] for a suture removal from her forehead, above her left eyebrow. Patient was seen 10 days ago for a laceration above her left eyebrow after falling at the [facility] while exercising. See previous note. She also injured her right elbow and right knee at that time. Her right elbow has improved, but she is still having knee pain. She had an XR of her knee this morning.    Review of Systems  Musculoskeletal: Positive for arthralgias.  Skin: Positive for wound.       Objective:   Physical Exam CONSTITUTIONAL: Well developed/well nourished HEAD: Normocephalic/atraumatic EYES: EOM/PERRL ENMT: Mucous membranes moist NECK: supple no meningeal signs SPINE: entire spine nontender CV: S1/S2 noted, no murmurs/rubs/gallops noted LUNGS: Lungs are clear to auscultation bilaterally, no apparent distress ABDOMEN: soft, nontender, no rebound or guarding GU: no cva tenderness NEURO: Pt is awake/alert, moves all extremitiesx4 EXTREMITIES: pulses normal, full ROM SKIN: warm, color normal. Well-healed left forehead scar. Large fading ecchymoses on the lateral left side of her face. PSYCH: no abnormalities of mood noted        Assessment & Plan:   This chart was scribed in my presence and reviewed by me personally.    ICD-9-CM ICD-10-CM   1. Facial trauma, initial encounter 959.09 S09.93XA      Signed, Robyn Haber, MD

## 2015-11-26 ENCOUNTER — Telehealth: Payer: Self-pay | Admitting: Pulmonary Disease

## 2015-11-26 NOTE — Telephone Encounter (Signed)
Called, spoke with pt's daughter, [name].  States they were advised by PCP that pt needs to see a "pulmonary rheumatologist" to manage pt's prednisone.  [name] would like to know if RA has recs on a specialist of this sort in the area.  Dr. Elsworth Soho, please advise.  Thank you.

## 2015-11-26 NOTE — Telephone Encounter (Signed)
[name] Dr deveshwar Dr Trudie Reed Our rheumatologists

## 2015-11-26 NOTE — Telephone Encounter (Signed)
LMTCB for Martha Mcbride 

## 2015-11-27 NOTE — Telephone Encounter (Signed)
Spoke with [name] and notified of recs per RA  She verbalized understanding and nothing further needed

## 2015-12-06 ENCOUNTER — Other Ambulatory Visit: Payer: Self-pay | Admitting: Rheumatology

## 2015-12-06 DIAGNOSIS — M8580 Other specified disorders of bone density and structure, unspecified site: Secondary | ICD-10-CM

## 2015-12-24 ENCOUNTER — Ambulatory Visit
Admission: RE | Admit: 2015-12-24 | Discharge: 2015-12-24 | Disposition: A | Discharge status: Home / Self Care | Payer: Medicare Other | Source: Ambulatory Visit | Attending: Rheumatology | Admitting: Rheumatology

## 2015-12-24 DIAGNOSIS — M8580 Other specified disorders of bone density and structure, unspecified site: Secondary | ICD-10-CM

## 2016-01-20 ENCOUNTER — Other Ambulatory Visit: Payer: Self-pay | Admitting: Internal Medicine

## 2016-01-20 ENCOUNTER — Ambulatory Visit
Admission: RE | Admit: 2016-01-20 | Discharge: 2016-01-20 | Disposition: A | Discharge status: Home / Self Care | Payer: Medicare Other | Source: Ambulatory Visit | Attending: Internal Medicine | Admitting: Internal Medicine

## 2016-01-20 DIAGNOSIS — J168 Pneumonia due to other specified infectious organisms: Secondary | ICD-10-CM

## 2016-02-05 ENCOUNTER — Ambulatory Visit
Admission: RE | Admit: 2016-02-05 | Discharge: 2016-02-05 | Disposition: A | Discharge status: Home / Self Care | Payer: Medicare Other | Source: Ambulatory Visit | Attending: Internal Medicine | Admitting: Internal Medicine

## 2016-02-05 ENCOUNTER — Other Ambulatory Visit: Payer: Self-pay | Admitting: Internal Medicine

## 2016-02-05 DIAGNOSIS — J189 Pneumonia, unspecified organism: Secondary | ICD-10-CM

## 2016-02-11 ENCOUNTER — Ambulatory Visit: Payer: Medicare Other | Admitting: Pulmonary Disease

## 2016-02-19 ENCOUNTER — Encounter: Payer: Self-pay | Admitting: Pulmonary Disease

## 2016-02-19 ENCOUNTER — Ambulatory Visit (HOSPITAL_COMMUNITY): Payer: Medicare Other

## 2016-02-26 ENCOUNTER — Encounter: Payer: Self-pay | Admitting: Physician Assistant

## 2016-02-27 ENCOUNTER — Encounter: Payer: Self-pay | Admitting: Physician Assistant

## 2016-02-27 ENCOUNTER — Ambulatory Visit (INDEPENDENT_AMBULATORY_CARE_PROVIDER_SITE_OTHER): Payer: Medicare Other | Admitting: Physician Assistant

## 2016-02-27 VITALS — BP 108/60 | HR 84 | Ht 59.0 in | Wt 134.0 lb

## 2016-02-27 DIAGNOSIS — I824Y3 Acute embolism and thrombosis of unspecified deep veins of proximal lower extremity, bilateral: Secondary | ICD-10-CM | POA: Diagnosis not present

## 2016-02-27 DIAGNOSIS — R05 Cough: Secondary | ICD-10-CM

## 2016-02-27 DIAGNOSIS — I350 Nonrheumatic aortic (valve) stenosis: Secondary | ICD-10-CM | POA: Diagnosis not present

## 2016-02-27 DIAGNOSIS — I5032 Chronic diastolic (congestive) heart failure: Secondary | ICD-10-CM

## 2016-02-27 DIAGNOSIS — J84112 Idiopathic pulmonary fibrosis: Secondary | ICD-10-CM

## 2016-02-27 DIAGNOSIS — I050 Rheumatic mitral stenosis: Secondary | ICD-10-CM | POA: Diagnosis not present

## 2016-02-27 DIAGNOSIS — R059 Cough, unspecified: Secondary | ICD-10-CM

## 2016-02-27 LAB — BASIC METABOLIC PANEL
BUN: 22 mg/dL (ref 7–25)
CHLORIDE: 98 mmol/L (ref 98–110)
CO2: 24 mmol/L (ref 20–31)
Calcium: 9.7 mg/dL (ref 8.6–10.4)
Creat: 0.89 mg/dL — ABNORMAL HIGH (ref 0.60–0.88)
Glucose, Bld: 115 mg/dL — ABNORMAL HIGH (ref 65–99)
POTASSIUM: 4 mmol/L (ref 3.5–5.3)
Sodium: 135 mmol/L (ref 135–146)

## 2016-02-27 LAB — BRAIN NATRIURETIC PEPTIDE: Brain Natriuretic Peptide: 209.2 pg/mL — ABNORMAL HIGH (ref <100)

## 2016-02-27 NOTE — Patient Instructions (Addendum)
Medication Instructions:  Your physician recommends that you continue on your current medications as directed. Please refer to the Current Medication list given to you today. Labwork: TODAY BMET, BNP Testing/Procedures: Your physician has requested that you have an echocardiogram. Echocardiography is a painless test that uses sound waves to create images of your heart. It provides your doctor with information about the size and shape of your heart and how well your heart's chambers and valves are working. This procedure takes approximately one hour. There are no restrictions for this procedure. Follow-Up: DR. Stanford Breed IN 4-6 WEEKS YOU HAVE AN APPT WITH DR. Elsworth Soho 03/02/16 AT 9 AM TO FOLLOW UP ON PNEUMONIA  Any Other Special Instructions Will Be Listed Below (If Applicable). If you need a refill on your cardiac medications before your next appointment, please call your pharmacy.

## 2016-02-27 NOTE — Progress Notes (Signed)
Cardiology Office Note:    Date:  02/27/2016   ID:  Martha Mcbride, DOB Aug 22, 1925, MRN [id number]  PCP:  Kandice Hams, MD  Cardiologist:  Dr. Kirk Ruths   Electrophysiologist:  N/a Pulmonology: Dr. Elsworth Soho  Referring MD: Seward Carol, MD   Chief Complaint  Patient presents with  . Cough    History of Present Illness:     Martha Mcbride is a 82 y.o. female with a hx of HTN, diastolic CHF, aortic stenosis, mitral stenosis, interstitial lung disease with presumed IPF, PMR on chronic prednisone, severe dysphagia status post PEG. She's had multiple omissions to hospital with hospital acquired pneumonia in the past. Nuclear stress test in 9/15 negative for ischemia. Echo 9/15 with normal LV function, moderate AS, moderate MS. Last seen by Dr. Stanford Breed 1/16. At that time, the patient presented with failure to thrive. Admission to the hospital was recommended. At that time, she was not candidate for valve surgery. She was admitted 1/19-1/29 2016 with aspiration pneumonia, bilateral DVT, sacral decubitus. Patient underwent IVC filter. She refused Coumadin.  Here today with her daughter. She was treated by her PCP for pneumonia in early May. She's had a chronic cough since then. She denies fever. She's completed antibiotics. Her cough is nonproductive. She is somewhat hoarse. Prior to getting sick, she remained very active. She was able to do most activities without significant dyspnea. She sleeps on an incline chronically. Denies PND. Denies edema. Denies chest discomfort. Denies syncope.   Past Medical History  Diagnosis Date  . Arthritis   . Polymyalgia ([place])   . Acid reflux   . Immune deficiency disorder ([facility])     autoimmune disorder - polymyalgia - on prednisone  . Breast cancer ([place])     Treated with lumpectomy and radiation  . Giardia   . Pinworms   . History of hiatal hernia   . Pneumonia   . Dementia   . CHF (congestive heart failure) ([place])   . Dysrhythmia   . Diabetes  mellitus without complication [facility])     Past Surgical History  Procedure Laterality Date  . Breast lumpectomy    . Partial hysterectomy    . Tonsillectomy    . Tee without cardioversion N/A 07/20/2014    Procedure: TRANSESOPHAGEAL ECHOCARDIOGRAM (TEE);  Surgeon: Candee Furbish, MD;  Location: [facility] ENDOSCOPY;  Service: Cardiovascular;  Laterality: N/A;  . Abdominal hysterectomy      Current Medications: Outpatient Prescriptions Prior to Visit  Medication Sig Dispense Refill  . furosemide (LASIX) 20 MG tablet Take 1 tablet by mouth 2 (two) times daily.  0  . hydroxypropyl methylcellulose / hypromellose (ISOPTO TEARS / GONIOVISC) 2.5 % ophthalmic solution Place 1 drop into both eyes daily as needed for dry eyes.     . Levothyroxine Sodium (SYNTHROID PO) Take 25 mcg by mouth daily.     Marland Kitchen aspirin 81 MG chewable tablet Place 1 tablet (81 mg total) into feeding tube daily. (Patient not taking: Reported on 02/27/2016)    . folic acid (FOLVITE) 1 MG tablet Place 1 tablet (1 mg total) into feeding tube daily. (Patient not taking: Reported on 02/27/2016)    . HYDROcodone-acetaminophen (NORCO) 5-325 MG tablet Take 1 tablet by mouth every 6 (six) hours as needed for moderate pain. (Patient not taking: Reported on 02/27/2016) 12 tablet 0  . insulin aspart (NOVOLOG) 100 UNIT/ML injection Order Questions    Question Answer Comment   Correction coverage: Moderate (average weight, post-op)    CBG <  70: implement hypoglycemia protocol    CBG 70 - 120: 0 units    CBG 121 - 150: 2 units    CBG 151 - 200: 3 units    CBG 201 - 250: 5 units    CBG 251 - 300: 8 units    CBG 301 - 350: 11 units    CBG 351 - 400: 15 units    CBG > 400 call MD and obtain STAT lab verification    (Patient not taking: Reported on 02/27/2016) 10 mL 11  . metoprolol tartrate (LOPRESSOR) 25 MG tablet Take 1 tablet by mouth 2 (two) times daily. Reported on 02/27/2016    . potassium chloride 20 MEQ/15ML (10%) SOLN Place 15  mLs (20 mEq total) into feeding tube daily. (Patient not taking: Reported on 02/27/2016) 450 mL 0  . predniSONE (DELTASONE) 5 MG tablet Place 3 tablets (15 mg total) into feeding tube daily with breakfast. (Patient not taking: Reported on 02/27/2016) 30 tablet 0  . saccharomyces boulardii (FLORASTOR) 250 MG capsule 250mg  per feeding tube two times a day (Patient not taking: Reported on 02/27/2016) 60 capsule 0   No facility-administered medications prior to visit.      Allergies:   Doxycycline; Meperidine; Other; Penicillins; Sulfa antibiotics; Sulfamethoxazole; and Methotrexate   Social History   Social History  . Marital Status: Widowed    Spouse Name: N/A  . Number of Children: 4  . Years of Education: N/A   Social History Main Topics  . Smoking status: Never Smoker   . Smokeless tobacco: Never Used  . Alcohol Use: No  . Drug Use: No  . Sexual Activity: Not Asked   Other Topics Concern  . None   Social History Narrative     Family History:  The patient's family history includes Stroke in her mother.   ROS:   Please see the history of present illness.    Review of Systems  Constitution: Positive for decreased appetite.  Respiratory: Positive for cough.    All other systems reviewed and are negative.   Physical Exam:    VS:  BP 108/60 mmHg  Pulse 84  Ht 4\' 11"  (1.499 m)  Wt 134 lb (60.782 kg)  BMI 27.05 kg/m2   General appearance: alert and cooperative Neck: no adenopathy, no JVD and thyroid not enlarged, symmetric, no tenderness/mass/nodules Lungs: Decreased breath sound bilaterally with dry bibasilar crackles Heart: regular rate and rhythm, S1: normal and systolic murmur: systolic ejection 3/6, crescendo, decrescendo and harsh RUSB Abdomen: Soft, nontender Extremities: No edema Pulses: 2+ and symmetric Carotid pulses 2+ Skin: Skin color, texture, turgor normal. No rashes or lesions Neurologic: Alert and oriented X 3, normal strength and tone. Normal symmetric  reflexes. Normal coordination and gait MSK - no deformity  Wt Readings from Last 3 Encounters:  02/27/16 134 lb (60.782 kg)  10/31/15 138 lb (62.596 kg)  10/21/15 136 lb 9.6 oz (61.961 kg)      Studies/Labs Reviewed:     EKG:  EKG is  ordered today.  The ekg ordered today demonstrates NSR, HR 83, inferior Q waves, LAD, IVCD, poor R-wave progression, anteroseptal Q waves, QTc 437 ms, no significant change when compared to prior tracing  Recent Labs: No results found for requested labs within last 365 days.   Recent Lipid Panel No results found for: CHOL, TRIG, HDL, CHOLHDL, VLDL, LDLCALC, LDLDIRECT  Additional studies/ records that were reviewed today include:   LE arterial duplex 09/13/14 Patent R SFA  TEE 07/20/14 EF 60-65%, moderate aortic stenosis, severe MAC, mild mitral stenosis, no vegetation  Myoview 06/04/14 IMPRESSION: 1. No scintigraphic evidence of prior infarction or pharmacologically induced ischemia. 2. Normal left ventricular wall motion. 3. Left ventricular ejection fraction 55% 4. Low-risk stress test findings*.  Echo 06/03/14 Severe LVH, mild focal basal septal hypertrophy, vigorous LVF, EF 65-70%, normal wall motion, moderate aortic stenosis, moderate mitral stenosis, mild MR, moderate LAE, mildly increased PASP, mean AV gradient 26 mmHg, peak 54 mmHg; mean MV gradient 10 mmHg   ASSESSMENT:     1. Chronic diastolic heart failure ([place])   2. Aortic stenosis   3. Mitral valve stenosis, unspecified etiology   4. Deep vein thrombosis (DVT) of proximal vein of both lower extremities, unspecified chronicity ([place])   5. IPF (idiopathic pulmonary fibrosis) ([place])   6. Cough     PLAN:     In order of problems listed above:  1. Chronic diastolic CHF - Overall her volume appears stable. I do not suspect that her cough is related to volume excess. She has some crackles on exam but this is likely related to pulmonary fibrosis. Her chest x-ray demonstrated  pneumonia but no significant edema. I will obtain a follow-up BMET, BNP. If her BNP is elevated, I can increase her Lasix for several days and then resume usual dose.  2. Aortic stenosis - At last echo, her stenosis Was moderate. Her murmur is fairly loud. Obtain follow-up echo.   3. Mitral stenosis - As noted previous echo demonstrated moderate mitral stenosis. Follow-up echocardiogram will be obtained.  4. DVT - This was diagnosed during hospitalization in 1/16. She refused Coumadin and IVC filter was placed.  5. Pulmonary fibrosis - I suspect some of her symptoms may be related to exacerbation of her pulmonary fibrosis from recent pneumonia. I have encouraged her to follow-up with pulmonology. We will try to arrange a follow-up appointment for her.  6. Cough - As noted above, her cough may be related to her pulmonary fibrosis and Pulmonology follow-up will be arranged. Obtain BMET, BNP as noted.   Medication Adjustments/Labs and Tests Ordered: Current medicines are reviewed at length with the patient today.  Concerns regarding medicines are outlined above.  Medication changes, Labs and Tests ordered today are outlined in the Patient Instructions noted below. Patient Instructions  Medication Instructions:  Your physician recommends that you continue on your current medications as directed. Please refer to the Current Medication list given to you today. Labwork: TODAY BMET, BNP Testing/Procedures: Your physician has requested that you have an echocardiogram. Echocardiography is a painless test that uses sound waves to create images of your heart. It provides your doctor with information about the size and shape of your heart and how well your heart's chambers and valves are working. This procedure takes approximately one hour. There are no restrictions for this procedure. Follow-Up: DR. Stanford Breed IN 4-6 WEEKS YOU HAVE AN APPT WITH DR. Elsworth Soho 03/02/16 AT 9 AM TO FOLLOW UP ON PNEUMONIA  Any Other  Special Instructions Will Be Listed Below (If Applicable). If you need a refill on your cardiac medications before your next appointment, please call your pharmacy.   Signed, Richardson Dopp, PA-C  02/27/2016 11:14 AM    [name] [place], [place], [place]  [address] Phone: [phone]; Fax: [phone]

## 2016-02-28 ENCOUNTER — Telehealth: Payer: Self-pay | Admitting: *Deleted

## 2016-02-28 DIAGNOSIS — I5032 Chronic diastolic (congestive) heart failure: Secondary | ICD-10-CM

## 2016-02-28 NOTE — Telephone Encounter (Signed)
S/w pt who asked if I would cb in about 20-25 minutes to s/w her daughter Martha Mcbride in regards to lab results and changes.

## 2016-02-28 NOTE — Telephone Encounter (Signed)
DPR on file for daughter [name]. [name] notified of lab results and med changes for pt with verbal read back x 2. Increase lasix 40 mg AM/20 mg PM x 5 days; then resume lasix 20 mg BID; increase K+ 30 meq daily x 5 days; then resume K+ 20 meq qd. bmet 6/9.

## 2016-03-02 ENCOUNTER — Ambulatory Visit (INDEPENDENT_AMBULATORY_CARE_PROVIDER_SITE_OTHER): Payer: Medicare Other | Admitting: Pulmonary Disease

## 2016-03-02 ENCOUNTER — Encounter: Payer: Self-pay | Admitting: Pulmonary Disease

## 2016-03-02 VITALS — BP 112/60 | HR 89 | Wt 134.0 lb

## 2016-03-02 DIAGNOSIS — J849 Interstitial pulmonary disease, unspecified: Secondary | ICD-10-CM | POA: Diagnosis not present

## 2016-03-02 DIAGNOSIS — R0902 Hypoxemia: Secondary | ICD-10-CM

## 2016-03-02 DIAGNOSIS — J84112 Idiopathic pulmonary fibrosis: Secondary | ICD-10-CM

## 2016-03-02 NOTE — Assessment & Plan Note (Signed)
Ct 2L o2 during sleep

## 2016-03-02 NOTE — Patient Instructions (Signed)
Check sputum culture High-resolution CT of the chest Based on these tests we will decide if he needs more antibiotics  You have pulmonary fibrosis Take prednisone as directed by the rheumatologist  Ambulatory saturation Stay on oxygen at bedtime

## 2016-03-02 NOTE — Progress Notes (Signed)
   Subjective:    Patient ID: Martha Mcbride, female    DOB: [birth year], 82 y.o.   MRN: [id number]  HPI  82 y.o. never smoker for follow-up of interstitial lung disease presumed IPF, on nocturnal oxygen PMH significant for Polymyalgia on prednisone, severely calcified mitral valve with moderate stenosis, chronic diastolic heart failure,Gangrenous toes on the right foot   03/02/2016  Chief Complaint  Patient presents with  . Interstitial Lung Disease    Reports that she had PNA in May.    Accompanied by her daughter  On 5mg  qod pred- dr Charlestine Night She has done well, has been ambulatory, compliant with nocturnal oxygen Was treated for right lower lobe pneumonia by PCP-reviewed imaging that shows right lower lobe infiltrate with improvement on repeat x-ray on 5/10 Her dyspnea has improved, she still has cough with yellow sputum production Lasix was just increased by cardiology for pedal edema  Significant tests/ events  admission 10-27/15 -health care associated PNA, encephalopathy, and diastolic HF exacerbation.  Admitted 07/28/14 with fever 103, confusion,productive cough. WBC at 12, Lactic acid at 3.5. Chest x ray -diffuse bilateral pulmonary interstitial infiltrates.& eosinophilia (AEC 1800 on 10/22 which came down to 600 on 10/31). Diarrhea- resolved & felt unlikely to be parasitic by ID - stool O & P neg.   07/30/14 CT: Evidence of emphysema with traction bronchiectasis in multiple areas of interstitial fibrosis. This appearance is consistent with underlying usual interstitial pneumonitis. There also moderate pleural effusions.  04/2012 CT abd >>large hiatal hernia . Prominent interstitial lung markings suggest chronic changes  06/2014 TEE mild MS, mod AS, nml LVfn Ova and parasite stool neg.  Strongyloids antibody neg Quantiferon Tb neg   swallowing evaluation- severe oropharyngeal dysphagia status post PEG placement   Admitted 07/2014 for recurrent aspiration  pneumonia, complicated by severe sepsis and decompensated congestive heart failure. Following with wound center with sacral wounds.  08/2014 c -ANCA 1:80, ANA 1:40  PEG placed & then reversed   She was admitted again 09/2014 for sacral wounds, issues with adrenal insufficiency 10/20/14 venous duplex-bilateral DVT, IVC filter placed CT abdomen 10/16/14-moderate sliding hernia. TEE-moderate aortic stenosis   Review of Systems neg for any significant sore throat, dysphagia, itching, sneezing, nasal congestion or excess/ purulent secretions, fever, chills, sweats, unintended wt loss, pleuritic or exertional cp, hempoptysis, orthopnea pnd or change in chronic leg swelling. Also denies presyncope, palpitations, heartburn, abdominal pain, nausea, vomiting, diarrhea or change in bowel or urinary habits, dysuria,hematuria, rash, arthralgias, visual complaints, headache, numbness weakness or ataxia.     Objective:   Physical Exam   Gen. Pleasant, well-nourished, in no distress ENT -erythema pharynx, no post nasal drip Neck: No JVD, no thyromegaly, no carotid bruits Lungs: no use of accessory muscles, no dullness to percussion, bibasal coarse rales , no rhonchi  Cardiovascular: Rhythm regular, heart sounds  normal, no murmurs or gallops, 1+ peripheral edema Musculoskeletal: No deformities, no cyanosis or clubbing         Assessment & Plan:

## 2016-03-02 NOTE — Assessment & Plan Note (Signed)
Remains high risk for aspiration Aspiration precautions

## 2016-03-02 NOTE — Assessment & Plan Note (Signed)
Check sputum culture-since she recently got antibiotics, and does not have typical symptoms of pneumonia High-resolution CT of the chest Based on these tests we will decide if he needs more antibiotics  You have pulmonary fibrosis - unclear if this is related to underlying inflammatory condition but more likely IPF Take prednisone as directed by the rheumatologist  Ambulatory saturation Stay on oxygen at bedtime

## 2016-03-03 ENCOUNTER — Other Ambulatory Visit: Payer: Medicare Other

## 2016-03-03 DIAGNOSIS — J849 Interstitial pulmonary disease, unspecified: Secondary | ICD-10-CM

## 2016-03-05 ENCOUNTER — Ambulatory Visit (HOSPITAL_COMMUNITY)
Admission: RE | Admit: 2016-03-05 | Discharge: 2016-03-05 | Disposition: A | Discharge status: Home / Self Care | Payer: Medicare Other | Source: Ambulatory Visit | Attending: Rheumatology | Admitting: Rheumatology

## 2016-03-06 ENCOUNTER — Ambulatory Visit (INDEPENDENT_AMBULATORY_CARE_PROVIDER_SITE_OTHER)
Admission: RE | Admit: 2016-03-06 | Discharge: 2016-03-06 | Disposition: A | Discharge status: Home / Self Care | Payer: Medicare Other | Source: Ambulatory Visit | Attending: Pulmonary Disease | Admitting: Pulmonary Disease

## 2016-03-06 ENCOUNTER — Other Ambulatory Visit (INDEPENDENT_AMBULATORY_CARE_PROVIDER_SITE_OTHER): Payer: Medicare Other

## 2016-03-06 DIAGNOSIS — I5032 Chronic diastolic (congestive) heart failure: Secondary | ICD-10-CM | POA: Diagnosis not present

## 2016-03-06 DIAGNOSIS — J849 Interstitial pulmonary disease, unspecified: Secondary | ICD-10-CM | POA: Diagnosis not present

## 2016-03-06 LAB — BASIC METABOLIC PANEL
BUN: 34 mg/dL — ABNORMAL HIGH (ref 7–25)
CHLORIDE: 98 mmol/L (ref 98–110)
CO2: 26 mmol/L (ref 20–31)
Calcium: 9.9 mg/dL (ref 8.6–10.4)
Creat: 0.9 mg/dL — ABNORMAL HIGH (ref 0.60–0.88)
Glucose, Bld: 113 mg/dL — ABNORMAL HIGH (ref 65–99)
POTASSIUM: 3.5 mmol/L (ref 3.5–5.3)
Sodium: 135 mmol/L (ref 135–146)

## 2016-03-07 ENCOUNTER — Encounter (HOSPITAL_COMMUNITY): Payer: Self-pay | Admitting: Emergency Medicine

## 2016-03-07 ENCOUNTER — Inpatient Hospital Stay (HOSPITAL_COMMUNITY)
Admission: EM | Admit: 2016-03-07 | Discharge: 2016-03-09 | DRG: 189 | Disposition: A | Discharge status: Home / Self Care | Payer: Medicare Other | Attending: Internal Medicine | Admitting: Internal Medicine

## 2016-03-07 ENCOUNTER — Emergency Department (HOSPITAL_COMMUNITY): Payer: Medicare Other

## 2016-03-07 DIAGNOSIS — J189 Pneumonia, unspecified organism: Secondary | ICD-10-CM | POA: Diagnosis not present

## 2016-03-07 DIAGNOSIS — T17998A Other foreign object in respiratory tract, part unspecified causing other injury, initial encounter: Secondary | ICD-10-CM

## 2016-03-07 DIAGNOSIS — J84112 Idiopathic pulmonary fibrosis: Secondary | ICD-10-CM | POA: Diagnosis present

## 2016-03-07 DIAGNOSIS — I5032 Chronic diastolic (congestive) heart failure: Secondary | ICD-10-CM | POA: Diagnosis present

## 2016-03-07 DIAGNOSIS — J9620 Acute and chronic respiratory failure, unspecified whether with hypoxia or hypercapnia: Secondary | ICD-10-CM | POA: Diagnosis present

## 2016-03-07 DIAGNOSIS — Z853 Personal history of malignant neoplasm of breast: Secondary | ICD-10-CM

## 2016-03-07 DIAGNOSIS — E119 Type 2 diabetes mellitus without complications: Secondary | ICD-10-CM | POA: Diagnosis present

## 2016-03-07 DIAGNOSIS — T17908A Unspecified foreign body in respiratory tract, part unspecified causing other injury, initial encounter: Secondary | ICD-10-CM | POA: Diagnosis present

## 2016-03-07 DIAGNOSIS — M1990 Unspecified osteoarthritis, unspecified site: Secondary | ICD-10-CM | POA: Diagnosis present

## 2016-03-07 DIAGNOSIS — Z9889 Other specified postprocedural states: Secondary | ICD-10-CM | POA: Diagnosis not present

## 2016-03-07 DIAGNOSIS — Z79899 Other long term (current) drug therapy: Secondary | ICD-10-CM | POA: Diagnosis not present

## 2016-03-07 DIAGNOSIS — Z9981 Dependence on supplemental oxygen: Secondary | ICD-10-CM

## 2016-03-07 DIAGNOSIS — Z880 Allergy status to penicillin: Secondary | ICD-10-CM | POA: Diagnosis not present

## 2016-03-07 DIAGNOSIS — I080 Rheumatic disorders of both mitral and aortic valves: Secondary | ICD-10-CM | POA: Diagnosis present

## 2016-03-07 DIAGNOSIS — J9621 Acute and chronic respiratory failure with hypoxia: Principal | ICD-10-CM | POA: Diagnosis present

## 2016-03-07 DIAGNOSIS — K219 Gastro-esophageal reflux disease without esophagitis: Secondary | ICD-10-CM | POA: Diagnosis present

## 2016-03-07 DIAGNOSIS — F0390 Unspecified dementia without behavioral disturbance: Secondary | ICD-10-CM | POA: Diagnosis present

## 2016-03-07 DIAGNOSIS — Z923 Personal history of irradiation: Secondary | ICD-10-CM

## 2016-03-07 DIAGNOSIS — Z823 Family history of stroke: Secondary | ICD-10-CM | POA: Diagnosis not present

## 2016-03-07 DIAGNOSIS — R918 Other nonspecific abnormal finding of lung field: Secondary | ICD-10-CM | POA: Diagnosis present

## 2016-03-07 DIAGNOSIS — J9601 Acute respiratory failure with hypoxia: Secondary | ICD-10-CM

## 2016-03-07 DIAGNOSIS — Z888 Allergy status to other drugs, medicaments and biological substances status: Secondary | ICD-10-CM | POA: Diagnosis not present

## 2016-03-07 DIAGNOSIS — Z7982 Long term (current) use of aspirin: Secondary | ICD-10-CM

## 2016-03-07 DIAGNOSIS — Z882 Allergy status to sulfonamides status: Secondary | ICD-10-CM

## 2016-03-07 DIAGNOSIS — Z90710 Acquired absence of both cervix and uterus: Secondary | ICD-10-CM | POA: Diagnosis not present

## 2016-03-07 DIAGNOSIS — Z86718 Personal history of other venous thrombosis and embolism: Secondary | ICD-10-CM | POA: Diagnosis not present

## 2016-03-07 DIAGNOSIS — M353 Polymyalgia rheumatica: Secondary | ICD-10-CM | POA: Diagnosis present

## 2016-03-07 DIAGNOSIS — B370 Candidal stomatitis: Secondary | ICD-10-CM | POA: Diagnosis not present

## 2016-03-07 DIAGNOSIS — Z8701 Personal history of pneumonia (recurrent): Secondary | ICD-10-CM | POA: Diagnosis not present

## 2016-03-07 DIAGNOSIS — X30XXXA Exposure to excessive natural heat, initial encounter: Secondary | ICD-10-CM

## 2016-03-07 LAB — CK: CK TOTAL: 31 U/L — AB (ref 38–234)

## 2016-03-07 LAB — COMPREHENSIVE METABOLIC PANEL
ALT: 15 U/L (ref 14–54)
ANION GAP: 9 (ref 5–15)
AST: 30 U/L (ref 15–41)
Albumin: 2.6 g/dL — ABNORMAL LOW (ref 3.5–5.0)
Alkaline Phosphatase: 61 U/L (ref 38–126)
BUN: 25 mg/dL — ABNORMAL HIGH (ref 6–20)
CHLORIDE: 101 mmol/L (ref 101–111)
CO2: 22 mmol/L (ref 22–32)
Calcium: 11 mg/dL — ABNORMAL HIGH (ref 8.9–10.3)
Creatinine, Ser: 0.83 mg/dL (ref 0.44–1.00)
Glucose, Bld: 117 mg/dL — ABNORMAL HIGH (ref 65–99)
POTASSIUM: 4 mmol/L (ref 3.5–5.1)
Sodium: 132 mmol/L — ABNORMAL LOW (ref 135–145)
TOTAL PROTEIN: 7 g/dL (ref 6.5–8.1)
Total Bilirubin: 1.7 mg/dL — ABNORMAL HIGH (ref 0.3–1.2)

## 2016-03-07 LAB — URINALYSIS, ROUTINE W REFLEX MICROSCOPIC
BILIRUBIN URINE: NEGATIVE
GLUCOSE, UA: NEGATIVE mg/dL
HGB URINE DIPSTICK: NEGATIVE
Ketones, ur: NEGATIVE mg/dL
Nitrite: NEGATIVE
PROTEIN: NEGATIVE mg/dL
SPECIFIC GRAVITY, URINE: 1.016 (ref 1.005–1.030)
pH: 6 (ref 5.0–8.0)

## 2016-03-07 LAB — CBC WITH DIFFERENTIAL/PLATELET
Basophils Absolute: 0.1 10*3/uL (ref 0.0–0.1)
Basophils Relative: 1 %
EOS PCT: 10 %
Eosinophils Absolute: 1 10*3/uL — ABNORMAL HIGH (ref 0.0–0.7)
HCT: 42.1 % (ref 36.0–46.0)
Hemoglobin: 13.6 g/dL (ref 12.0–15.0)
LYMPHS ABS: 3.5 10*3/uL (ref 0.7–4.0)
Lymphocytes Relative: 34 %
MCH: 29 pg (ref 26.0–34.0)
MCHC: 32.3 g/dL (ref 30.0–36.0)
MCV: 89.8 fL (ref 78.0–100.0)
MONO ABS: 1.1 10*3/uL — AB (ref 0.1–1.0)
Monocytes Relative: 11 %
NEUTROS ABS: 4.7 10*3/uL (ref 1.7–7.7)
Neutrophils Relative %: 44 %
PLATELETS: 135 10*3/uL — AB (ref 150–400)
RBC: 4.69 MIL/uL (ref 3.87–5.11)
RDW: 17.5 % — AB (ref 11.5–15.5)
WBC: 10.4 10*3/uL (ref 4.0–10.5)

## 2016-03-07 LAB — TSH: TSH: 4.346 u[IU]/mL (ref 0.350–4.500)

## 2016-03-07 LAB — URINE MICROSCOPIC-ADD ON
Bacteria, UA: NONE SEEN
RBC / HPF: NONE SEEN RBC/hpf (ref 0–5)

## 2016-03-07 LAB — PROTIME-INR
INR: 1.25 (ref 0.00–1.49)
PROTHROMBIN TIME: 15.8 s — AB (ref 11.6–15.2)

## 2016-03-07 LAB — I-STAT CG4 LACTIC ACID, ED: LACTIC ACID, VENOUS: 1.77 mmol/L (ref 0.5–2.0)

## 2016-03-07 LAB — APTT: APTT: 27 s (ref 24–37)

## 2016-03-07 LAB — STREP PNEUMONIAE URINARY ANTIGEN: Strep Pneumo Urinary Antigen: NEGATIVE

## 2016-03-07 LAB — I-STAT TROPONIN, ED: Troponin i, poc: 0.03 ng/mL (ref 0.00–0.08)

## 2016-03-07 LAB — CBG MONITORING, ED: GLUCOSE-CAPILLARY: 115 mg/dL — AB (ref 65–99)

## 2016-03-07 MED ORDER — SODIUM CHLORIDE 0.9 % IV BOLUS (SEPSIS): 250.0000 mL | Freq: Once | INTRAVENOUS | Status: DC

## 2016-03-07 MED ORDER — DEXTROSE 5 % IV SOLN
500.0000 mg | INTRAVENOUS | Status: DC
  Administered 2016-03-07 – 2016-03-08 (×2): 500 mg via INTRAVENOUS
  Filled 2016-03-07 (×3): qty 500

## 2016-03-07 MED ORDER — ENOXAPARIN SODIUM 40 MG/0.4ML ~~LOC~~ SOLN
40.0000 mg | SUBCUTANEOUS | Status: DC
  Administered 2016-03-07: 40 mg via SUBCUTANEOUS
  Filled 2016-03-07 (×2): qty 0.4

## 2016-03-07 MED ORDER — SODIUM CHLORIDE 0.9% FLUSH
3.0000 mL | Freq: Two times a day (BID) | INTRAVENOUS | Status: DC
  Administered 2016-03-07 – 2016-03-09 (×4): 3 mL via INTRAVENOUS

## 2016-03-07 MED ORDER — SODIUM CHLORIDE 0.9 % IV SOLN: 100.0000 mg | INTRAVENOUS | Status: DC

## 2016-03-07 MED ORDER — ACETAMINOPHEN 325 MG PO TABS: 650.0000 mg | ORAL_TABLET | Freq: Four times a day (QID) | ORAL | Status: DC | PRN

## 2016-03-07 MED ORDER — IPRATROPIUM-ALBUTEROL 0.5-2.5 (3) MG/3ML IN SOLN
3.0000 mL | RESPIRATORY_TRACT | Status: AC
  Administered 2016-03-07 (×3): 3 mL via RESPIRATORY_TRACT
  Filled 2016-03-07 (×3): qty 3

## 2016-03-07 MED ORDER — ACETAMINOPHEN 650 MG RE SUPP: 650.0000 mg | Freq: Four times a day (QID) | RECTAL | Status: DC | PRN

## 2016-03-07 MED ORDER — METHYLPREDNISOLONE SODIUM SUCC 40 MG IJ SOLR
40.0000 mg | Freq: Two times a day (BID) | INTRAMUSCULAR | Status: DC
  Administered 2016-03-07 – 2016-03-09 (×4): 40 mg via INTRAVENOUS
  Filled 2016-03-07 (×4): qty 1

## 2016-03-07 MED ORDER — ONDANSETRON HCL 4 MG PO TABS: 4.0000 mg | ORAL_TABLET | Freq: Four times a day (QID) | ORAL | Status: DC | PRN

## 2016-03-07 MED ORDER — POLYETHYLENE GLYCOL 3350 17 G PO PACK: 17.0000 g | PACK | Freq: Every day | ORAL | Status: DC | PRN

## 2016-03-07 MED ORDER — NYSTATIN 100000 UNIT/ML MT SUSP
5.0000 mL | Freq: Four times a day (QID) | OROMUCOSAL | Status: DC
  Administered 2016-03-07 – 2016-03-09 (×7): 500000 [IU] via ORAL
  Filled 2016-03-07 (×7): qty 5

## 2016-03-07 MED ORDER — DEXTROSE 5 % IV SOLN
1.0000 g | INTRAVENOUS | Status: DC
  Administered 2016-03-07 – 2016-03-08 (×2): 1 g via INTRAVENOUS
  Filled 2016-03-07 (×3): qty 10

## 2016-03-07 MED ORDER — ONDANSETRON HCL 4 MG/2ML IJ SOLN: 4.0000 mg | Freq: Four times a day (QID) | INTRAMUSCULAR | Status: DC | PRN

## 2016-03-07 MED ORDER — ASPIRIN EC 81 MG PO TBEC
81.0000 mg | DELAYED_RELEASE_TABLET | Freq: Every day | ORAL | Status: DC
  Administered 2016-03-07 – 2016-03-09 (×3): 81 mg via ORAL
  Filled 2016-03-07 (×3): qty 1

## 2016-03-07 MED ORDER — DEXTROSE 5 % IV SOLN: 1.0000 g | Freq: Once | INTRAVENOUS | Status: DC

## 2016-03-07 MED ORDER — SODIUM CHLORIDE 0.9 % IV SOLN
INTRAVENOUS | Status: DC
  Administered 2016-03-07: 16:00:00 via INTRAVENOUS

## 2016-03-07 MED ORDER — AZITHROMYCIN 500 MG IV SOLR
500.0000 mg | Freq: Once | INTRAVENOUS | Status: DC
Start: 2016-03-07 — End: 2016-03-07

## 2016-03-07 MED ORDER — SODIUM CHLORIDE 0.9 % IV BOLUS (SEPSIS)
250.0000 mL | Freq: Once | INTRAVENOUS | Status: AC
  Administered 2016-03-07: 250 mL via INTRAVENOUS

## 2016-03-07 MED ORDER — SODIUM CHLORIDE 0.9 % IV BOLUS (SEPSIS)
1000.0000 mL | Freq: Once | INTRAVENOUS | Status: AC
  Administered 2016-03-07: 1000 mL via INTRAVENOUS

## 2016-03-07 MED ORDER — WHITE PETROLATUM GEL
Status: AC
  Administered 2016-03-07: 21:00:00
  Filled 2016-03-07: qty 1

## 2016-03-07 MED ORDER — SODIUM CHLORIDE 0.9 % IV SOLN
200.0000 mg | Freq: Once | INTRAVENOUS | Status: DC
  Administered 2016-03-07: 200 mg via INTRAVENOUS
  Filled 2016-03-07: qty 200

## 2016-03-07 MED ORDER — LEVOTHYROXINE SODIUM 25 MCG PO TABS
25.0000 ug | ORAL_TABLET | Freq: Every day | ORAL | Status: DC
  Administered 2016-03-08 – 2016-03-09 (×2): 25 ug via ORAL
  Filled 2016-03-07 (×2): qty 1

## 2016-03-07 MED ORDER — FOLIC ACID 1 MG PO TABS
1.0000 mg | ORAL_TABLET | Freq: Every day | ORAL | Status: DC
  Administered 2016-03-07 – 2016-03-09 (×3): 1 mg via ORAL
  Filled 2016-03-07 (×3): qty 1

## 2016-03-07 MED ORDER — SACCHAROMYCES BOULARDII 250 MG PO CAPS
250.0000 mg | ORAL_CAPSULE | Freq: Two times a day (BID) | ORAL | Status: DC
  Administered 2016-03-07 – 2016-03-09 (×4): 250 mg via ORAL
  Filled 2016-03-07 (×4): qty 1
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
## 2016-03-07 NOTE — H&P (Signed)
History and Physical    DEVIDA FOTI [id number] DOB: October 16, 1924 DOA: 03/07/2016  PCP: Kandice Hams, MD Patient coming from: home  Chief Complaint: sob  HPI: ALIECIA Mcbride is a very pleasant 82 y.o. female with medical history significant of interstitial lung disease presumed IPF, chronic respiratory failure on nocturnal oxygen, polymyalgia on prednisone, chronic diastolic heart failure, S cancer, cardiac, pinworms, diabetes since emergency department since from home with the chief complaint of worsening shortness of breath altered mental status. Initial evaluation reveals acute on chronic respiratory failure with hypoxia likely related to recurrent pneumonia.  Information is obtained from family members who are at the bedside. Family reports patient has been "back-and-forth to doctors for 6 weeks dealing with her pneumonia". Daughter reports recent visit to Dr. Elsworth Soho. Today she was found in the car slumped over after he got overheated. He MS place an IV and her level of consciousness improved immediately. She denied any chest pain palpitation headache dizziness syncope or near-syncope. She has little recollection of event. Family reports she's had a moist nonproductive cough for "several weeks". Really oxygen saturation levels 85% on room air in the field. She was placed on nasal cannula and oxygen saturation levels increased and 96%. Family reports no recent choking or coughing episodes. No complaints of abdominal pain nausea vomiting lower extremity edema. He denies patient having any difficulty with chewing or swallowing despite the history noted of dysphasia. No recent fever chills travel or sick contacts.   ED Course: Rocephin and azithromycin initiated. IV fluids provided oxygen supplementation  Review of Systems: As per HPI otherwise 10 point review of systems negative.   Ambulatory Status: Uses a walker  Past Medical History  Diagnosis Date  . Arthritis   . Polymyalgia ([place])     . Acid reflux   . Immune deficiency disorder (HCC)     autoimmune disorder - polymyalgia - on prednisone  . Breast cancer ([place])     Treated with lumpectomy and radiation  . Giardia   . Pinworms   . History of hiatal hernia   . Pneumonia   . Dementia   . CHF (congestive heart failure) ([place])   . Dysrhythmia   . Diabetes mellitus without complication [facility])     Past Surgical History  Procedure Laterality Date  . Breast lumpectomy    . Partial hysterectomy    . Tonsillectomy    . Tee without cardioversion N/A 07/20/2014    Procedure: TRANSESOPHAGEAL ECHOCARDIOGRAM (TEE);  Surgeon: Candee Furbish, MD;  Location: [facility] ENDOSCOPY;  Service: Cardiovascular;  Laterality: N/A;  . Abdominal hysterectomy      Social History   Social History  . Marital Status: Widowed    Spouse Name: N/A  . Number of Children: 4  . Years of Education: N/A   Occupational History  . Not on file.   Social History Main Topics  . Smoking status: Never Smoker   . Smokeless tobacco: Never Used  . Alcohol Use: No  . Drug Use: No  . Sexual Activity: Not on file   Other Topics Concern  . Not on file   Social History Narrative    Allergies  Allergen Reactions  . Adhesive [Tape] Other (See Comments)    Tears skin off, Please use "paper" tape  . Doxycycline Nausea And Vomiting  . Meperidine Other (See Comments)    Unknown  . Other Nausea And Vomiting and Other (See Comments)    Anesthesia makes her very sleepy and makes it hard  for her to come out of it.   Marland Kitchen Penicillins Hives and Nausea And Vomiting    Has patient had a PCN reaction causing immediate rash, facial/tongue/throat swelling, SOB or lightheadedness with hypotension: Yes Has patient had a PCN reaction causing severe rash involving mucus membranes or skin necrosis: No Has patient had a PCN reaction that required hospitalization No Has patient had a PCN reaction occurring within the last 10 years: No If all of the above answers are "NO", then  may proceed with Cephalosporin use.   . Shellfish Allergy Nausea And Vomiting  . Sulfa Antibiotics Nausea And Vomiting  . Sulfamethoxazole Nausea And Vomiting  . Methotrexate Rash    Bleeding under the skin    Family History  Problem Relation Age of Onset  . Stroke Mother     Prior to Admission medications   Medication Sig Start Date End Date Taking? Authorizing Provider  aspirin 81 MG tablet Take 81 mg by mouth daily.    Historical Provider, MD  folic acid (FOLVITE) 1 MG tablet Take 1 mg by mouth daily.    Historical Provider, MD  furosemide (LASIX) 20 MG tablet Take 1 tablet by mouth 2 (two) times daily. 03/11/15   Historical Provider, MD  hydroxypropyl methylcellulose / hypromellose (ISOPTO TEARS / GONIOVISC) 2.5 % ophthalmic solution Place 1 drop into both eyes daily as needed for dry eyes.     Historical Provider, MD  Levothyroxine Sodium (SYNTHROID PO) Take 25 mcg by mouth daily.     Historical Provider, MD  metoprolol tartrate (LOPRESSOR) 25 MG tablet TAKE 1/4 (6.25 MG TOTAL) BY MOUTH TWO TIMES DAILY    Historical Provider, MD  potassium chloride 20 MEQ/15ML (10%) SOLN Take 20 mEq by mouth daily.    Historical Provider, MD  saccharomyces boulardii (FLORASTOR) 250 MG capsule Take 250 mg by mouth 2 (two) times daily. TAKE 1 TABLET 250 MG  BY MOUTH EVERY OTHER DAY    Historical Provider, MD    Physical Exam: Filed Vitals:   03/07/16 1630 03/07/16 1645 03/07/16 1715 03/07/16 1730  BP: 101/48 83/54 95/65  92/50  Pulse: 90 89 87 91  Temp:      TempSrc:      Resp: 24 30 27 21   Height:      Weight:      SpO2: 92% 98% 97% 98%     General:  Appears Pale somewhat pale somewhat lethargic Eyes:  PERRL, EOMI, normal lids ENT:  grossly normal hearing, tongue red, dry Neck:  no LAD, masses or thyromegaly Cardiovascular:  RRR, + murmur. No LE edema.  Respiratory:  Mild increased work of breathing, air movement diminished with coarse sounds bilateral bases Abdomen:  soft, ntnd,  NABS Skin:  no rash or induration seen on limited exam Musculoskeletal:  grossly normal tone BUE/BLE, good ROM, no bony abnormality Psychiatric:  grossly normal mood and affect, speech fluent and appropriate, AOx3 Neurologic:  Moves all extremities   Labs on Admission: I have personally reviewed following labs and imaging studies  CBC:  Recent Labs Lab 03/07/16 1330  WBC 10.4  NEUTROABS 4.7  HGB 13.6  HCT 42.1  MCV 89.8  PLT [id number]*   Basic Metabolic Panel:  Recent Labs Lab 03/06/16 1034 03/07/16 1330  NA 135 132*  K 3.5 4.0  CL 98 101  CO2 26 22  GLUCOSE 113* 117*  BUN 34* 25*  CREATININE 0.90* 0.83  CALCIUM 9.9 11.0*   GFR: Estimated Creatinine Clearance: 38.8 mL/min (by C-G formula  based on Cr of 0.83). Liver Function Tests:  Recent Labs Lab 03/07/16 1330  AST 30  ALT 15  ALKPHOS 61  BILITOT 1.7*  PROT 7.0  ALBUMIN 2.6*   No results for input(s): LIPASE, AMYLASE in the last 168 hours. No results for input(s): AMMONIA in the last 168 hours. Coagulation Profile:  Recent Labs Lab 03/07/16 1330  INR 1.25   Cardiac Enzymes:  Recent Labs Lab 03/07/16 1330  CKTOTAL 31*   BNP (last 3 results) No results for input(s): PROBNP in the last 8760 hours. HbA1C: No results for input(s): HGBA1C in the last 72 hours. CBG:  Recent Labs Lab 03/07/16 1257  GLUCAP 115*   Lipid Profile: No results for input(s): CHOL, HDL, LDLCALC, TRIG, CHOLHDL, LDLDIRECT in the last 72 hours. Thyroid Function Tests: No results for input(s): TSH, T4TOTAL, FREET4, T3FREE, THYROIDAB in the last 72 hours. Anemia Panel: No results for input(s): VITAMINB12, FOLATE, FERRITIN, TIBC, IRON, RETICCTPCT in the last 72 hours. Urine analysis:    Component Value Date/Time   COLORURINE YELLOW 10/23/2014 1251   APPEARANCEUR CLOUDY* 10/23/2014 1251   LABSPEC 1.024 10/23/2014 1251   PHURINE 6.0 10/23/2014 1251   GLUCOSEU NEGATIVE 10/23/2014 1251   HGBUR MODERATE* 10/23/2014 1251    BILIRUBINUR NEGATIVE 10/23/2014 1251   KETONESUR NEGATIVE 10/23/2014 1251   PROTEINUR NEGATIVE 10/23/2014 1251   UROBILINOGEN 0.2 10/23/2014 1251   NITRITE NEGATIVE 10/23/2014 1251   LEUKOCYTESUR MODERATE* 10/23/2014 1251    Creatinine Clearance: Estimated Creatinine Clearance: 38.8 mL/min (by C-G formula based on Cr of 0.83).  Sepsis Labs: @LABRCNTIP (procalcitonin:4,lacticidven:4) )No results found for this or any previous visit (from the past 240 hour(s)).   Radiological Exams on Admission: Dg Chest 2 View  03/07/2016  CLINICAL DATA:  82 year old female with a history of cough for 5 weeks EXAM: CHEST  2 VIEW COMPARISON:  02/05/2016, 01/20/2016, CT 03/06/2016 FINDINGS: Cardiomediastinal silhouette unchanged in size and contour. Calcifications of the aortic arch. Pattern of reticular nodular opacity with worsening opacities bilateral lungs most prominent in the mid left and mid right lungs. Opacity at the posterior base on the lateral view. Stigmata of emphysema, with increased retrosternal airspace, flattened hemidiaphragms, increased AP diameter, and hyperinflation on the AP view. Changes of bronchiectasis, present on comparison. IMPRESSION: Worsening multifocal interstitial/airspace opacity on a background of emphysema and chronic changes, potentially representing edema and/or infection. Given nodular changes on recent CT study, at least a follow-up PA and lateral chest X-ray is recommended in 3-4 weeks following trial of antibiotic therapy to ensure resolution. Signed, Dulcy Fanny. Earleen Newport, DO [facility] Radiology Electronically Signed   By: Corrie Mckusick D.O.   On: 03/07/2016 14:40   Ct Chest High Resolution  03/06/2016  CLINICAL DATA:  82 year old female with reported history of interstitial lung disease. Additional history of breast cancer and congestive heart failure. EXAM: CT CHEST WITHOUT CONTRAST TECHNIQUE: Multidetector CT imaging of the  chest was performed following the standard protocol without intravenous contrast. High resolution imaging of the lungs, as well as inspiratory and expiratory imaging, was performed. COMPARISON:  07/30/2014 chest CT.  02/05/2016 chest radiograph. FINDINGS: Motion degraded study. Mediastinum/Nodes: Mild cardiomegaly, slightly increased. No pericardial fluid/thickening. Left anterior descending coronary atherosclerosis. Coarse mitral annular calcification. Atherosclerotic nonaneurysmal thoracic aorta. Pulmonary arteries are borderline prominent (main pulmonary artery diameter 3.4 cm). Hypodense 0.6 cm right thyroid lobe nodule, unchanged. Normal esophagus. There is mild-to-moderate bilateral axillary lymphadenopathy, which is new since 2015, measuring up to 1.8 cm  on the right (series 4/ image 54) and 1.7 cm on the left (series 4/ image 63). Mild-to-moderate bilateral paratracheal adenopathy has increased, measuring up to 1.7 cm on the right (series 4/ image 43) increased from 1.4 cm, and measuring up to 2.2 cm on the left (series 4/image 30), increased from 1.2 cm. Mild left prevascular mediastinal adenopathy measuring up to 1.3 cm (series 4/ image 52), increased from 1.1 cm. Probable mild bilateral hilar adenopathy, poorly delineated on this noncontrast study. Stable coarsely calcified subcarinal lymph nodes from prior granulomatous disease. Lungs/Pleura: No pneumothorax. Trace left pleural effusion. No right pleural effusion. There are multiple (at least 5) new randomly distributed solid pulmonary nodules with ground-glass halos in the bilateral lungs, largest 1.9 x 1.6 cm in the right middle lobe (series 5/ image 64), 2.0 x 1.4 cm in the right lower lobe (series 5/ image 87) and 1.1 x 1.1 cm in the basilar left lower lobe (series 5/ image 103). There is patchy subpleural reticulation and ground-glass attenuation in both lungs with associated mild-to-moderate traction bronchiectasis. There is moderate honeycombing  predominantly in the medial upper and lower lobes bilaterally. These findings have mildly progressed since 07/30/2014. No appreciable air trapping on the limited expiration sequence. Upper abdomen: Moderate hiatal hernia. Musculoskeletal: No aggressive appearing focal osseous lesions. Moderate degenerative changes in the thoracic spine. IMPRESSION: 1. Several (at least 5) new randomly distributed pulmonary nodules in both lungs measuring up to 2.0 cm in the right lower lobe, most of which demonstrate ground-glass halos. Differential includes metastatic disease given the history of breast cancer versus atypical infection including fungal pneumonia given the ground-glass halos. Management options include short-term follow-up chest CT in 2-3 months, further evaluation with PET-CT and/or tissue sampling, as clinically warranted. 2. New bilateral axillary lymphadenopathy and increased moderate mediastinal and bilateral hilar adenopathy, nonspecific. Differential considerations include metastatic disease, lymphoproliferative disorder or other systemic conditions including connective tissue disorders. 3. These findings are superimposed on interstitial lung disease characterized by patchy subpleural reticulation and ground-glass attenuation, mild-to-moderate traction bronchiectasis and moderate honeycombing. These findings have mildly progressed since 07/30/2014 and are consistent with usual interstitial pneumonia (UIP). 4. Mild cardiomegaly.  One vessel coronary atherosclerosis. 5. Moderate hiatal hernia. Electronically Signed   By: Ilona Sorrel M.D.   On: 03/06/2016 14:23    EKG: Independently reviewed. Sinus tachycardia Probable left atrial enlargement LVH with secondary repolarization abnormality Inferior infarct, old Anterior infarct, acute (LAD) No significant change since last tracing  Assessment/Plan Principal Problem:   Acute on chronic respiratory failure (HCC) Active Problems:   Chronic diastolic heart  failure (HCC)   IPF (idiopathic pulmonary fibrosis) (HCC)   Aspiration into airway   New onset type 2 diabetes mellitus ([place])   CAP (community acquired pneumonia)   Thrush, oral   #1. Acute on chronic respiratory failure. Etiology likely multifactorial specifically worsening interstitial lung disease chronic aspiration, recent ct with new nodules that may be metastatic disease or atypical infection including fungal pneumonia. sats dropped 80's on room air. Baseline is nocturnal oxygen only -admit stepdown -Continue the oxygen supplementation -Solu-Medrol twice a day -Nebs -Aspiration precautions -Monitor -Pulmonary consult requested  #2. Community-acquired pneumonia. Failed outpatient therapy. Recently treated for right lower lobe pneumonia by PCP. Review indicates repeat imaging on May 10 revealed improvement. Lactic acid within the limits of normal. Afebrile. No leukocytosis. Blood pressure soft. -Continue antibiotics initiated in the emergency department -Blood cultures -Sputum cultures as able -Nebulizers -Fluid resuscitation -Hold Lopressor and Lasix  3. Thrush.  Oral. Likely related to recent antibiotics -nystatin -monitor  4. Idiopathic pulmonary fibrosis. Chart review indicates recent visit to pulmonology. High resolution CT is noted. Progress note indicates pulmonary fibrosis likely related IPF. -See #1  #5. Aspiration into airway in patient with history of severe dysphagia status post PEG He sent speech therapy eval reveals high risk for aspiration. Suspect currently patient is chronically aspirating. Discussed with family who refused additional speech therapy evaluation -Aspiration precautions  #6. Chronic diastolic heart failure. Appears compensated. Most recent echo yields EF of 55% with aortic stenosis, mitral valve stenosis. Review indicates Lasix recently increased for a few day due to lower extremity edema. Home medications include Lasix and Lopressor. Review  indicates follow-up echo ordered -We'll hold Lasix and Lopressor do to soft blood pressure -Monitor urine output -Obtain daily weights  7. History of DVT. This was diagnosed in January 2016. That time she refused Coumadin and IV C filter was placed  #8. Polymyalgia. On prednisone every other day. Stable -solumedrol 4omg BID as stress dose   DVT prophylaxis: levenox Code Status: limited  Family Communication: daughters at bedside  Disposition Plan: home or may need placement  Consults called: pulmonology  Admission status: inpatient    Radene Gunning MD [facility]  If 7PM-7AM, please contact night-coverage [email or web address] Password [facility]  03/07/2016, 5:32 PM

## 2016-03-07 NOTE — Consult Note (Signed)
PULMONARY MEDICINE Consult   Name: Martha Mcbride MRN: [id number] DOB: 05/18/1925    ADMISSION DATE:  03/07/2016 CONSULTATION DATE:  March 07, 2016  REFERRING MD:  Geradine Girt, DO  CHIEF COMPLAINT:  Altered Mental Status  HISTORY OF PRESENT ILLNESS:   Martha Mcbride is an 82 y/o woman with a history of ILD (presumed IPF, sees [place] in clinic) as well as polymyalgia rheumatica on chronic prednisone (5 mg e/o day) who was brought to the ED after an episode of heat exposure. She had been treated for a pneumonia in late April with a week of levofloxacin, and in f/u with Dr. Elsworth Soho on 6/5, a f/u chest CT was ordered. This had some persistent lesions of unclear significance, so pulmonary medicine was consulted.   PAST MEDICAL HISTORY :  She  has a past medical history of Arthritis; Polymyalgia ([place]); Acid reflux; Immune deficiency disorder ([place]); Breast cancer ([place]); Giardia; Pinworms; History of hiatal hernia; Pneumonia; Dementia; CHF (congestive heart failure) ([place]); Dysrhythmia; and Diabetes mellitus without complication ([place]).  PAST SURGICAL HISTORY: She  has past surgical history that includes Breast lumpectomy; Partial hysterectomy; Tonsillectomy; TEE without cardioversion (N/A, 07/20/2014); and Abdominal hysterectomy.  Allergies  Allergen Reactions  . Adhesive [Tape] Other (See Comments)    Tears skin off, Please use "paper" tape  . Doxycycline Nausea And Vomiting  . Meperidine Other (See Comments)    Unknown  . Other Nausea And Vomiting and Other (See Comments)    Anesthesia makes her very sleepy and makes it hard for her to come out of it.   Marland Kitchen Penicillins Hives and Nausea And Vomiting    Has patient had a PCN reaction causing immediate rash, facial/tongue/throat swelling, SOB or lightheadedness with hypotension: Yes Has patient had a PCN reaction causing severe rash involving mucus membranes or skin necrosis: No Has patient had a PCN reaction that required hospitalization No Has  patient had a PCN reaction occurring within the last 10 years: No If all of the above answers are "NO", then may proceed with Cephalosporin use.   . Shellfish Allergy Nausea And Vomiting  . Sulfa Antibiotics Nausea And Vomiting  . Sulfamethoxazole Nausea And Vomiting  . Methotrexate Rash    Bleeding under the skin    No current facility-administered medications on file prior to encounter.   Current Outpatient Prescriptions on File Prior to Encounter  Medication Sig  . aspirin 81 MG tablet Take 81 mg by mouth daily.  . folic acid (FOLVITE) 1 MG tablet Take 1 mg by mouth daily.  . furosemide (LASIX) 20 MG tablet Take 20 mg by mouth 2 (two) times daily.   . hydroxypropyl methylcellulose / hypromellose (ISOPTO TEARS / GONIOVISC) 2.5 % ophthalmic solution Place 1 drop into both eyes daily as needed for dry eyes.   . metoprolol tartrate (LOPRESSOR) 25 MG tablet Take 6.25 mg by mouth 2 (two) times daily.   . potassium chloride 20 MEQ/15ML (10%) SOLN Take 20 mEq by mouth daily.  Marland Kitchen saccharomyces boulardii (FLORASTOR) 250 MG capsule Take 250 mg by mouth every morning.   . Levothyroxine Sodium (SYNTHROID PO) Take 25 mcg by mouth daily. Reported on 03/07/2016    FAMILY HISTORY:  Her indicated that her mother is deceased. She indicated that her father is deceased.   SOCIAL HISTORY: She  reports that she has never smoked. She has never used smokeless tobacco. She reports that she does not drink alcohol or use illicit drugs.  REVIEW OF SYSTEMS:  Unable to obtain due to altered state. Oriented to self only.  SUBJECTIVE:  Pt's MS seems to be improving. Some cough, but it is not productive.  VITAL SIGNS: BP 115/53 mmHg  Pulse 84  Temp(Src) 98.2 F (36.8 C) (Oral)  Resp 23  Ht 5' 2.99" (1.6 m)  Wt 136 lb 3.9 oz (61.8 kg)  BMI 24.14 kg/m2  SpO2 100%  HEMODYNAMICS:    VENTILATOR SETTINGS:    INTAKE / OUTPUT: I/O last 3 completed shifts: In: 68 [P.O.:1000; I.V.:550] Out: 400  [Urine:400]  PHYSICAL EXAMINATION: General:  Elderly woman in NAD Neuro:  Awake, alert, oriented to self, but not to time, location ("hospital" only). HEENT:  Dry mucus membranes Cardiovascular:  4/6 systolic "blowing" murmur Lungs:  Basilar crackles bilaterally Abdomen:  Soft Skin:  Erythematous rash over face  LABS:  BMET  Recent Labs Lab 03/06/16 1034 03/07/16 1330  NA 135 132*  K 3.5 4.0  CL 98 101  CO2 26 22  BUN 34* 25*  CREATININE 0.90* 0.83  GLUCOSE 113* 117*    Electrolytes  Recent Labs Lab 03/06/16 1034 03/07/16 1330  CALCIUM 9.9 11.0*    CBC  Recent Labs Lab 03/07/16 1330  WBC 10.4  HGB 13.6  HCT 42.1  PLT 135*    Coag's  Recent Labs Lab 03/07/16 1330  APTT 27  INR 1.25    Sepsis Markers  Recent Labs Lab 03/07/16 1340  LATICACIDVEN 1.77    ABG No results for input(s): PHART, PCO2ART, PO2ART in the last 168 hours.  Liver Enzymes  Recent Labs Lab 03/07/16 1330  AST 30  ALT 15  ALKPHOS 61  BILITOT 1.7*  ALBUMIN 2.6*    Cardiac Enzymes No results for input(s): TROPONINI, PROBNP in the last 168 hours.  Glucose  Recent Labs Lab 03/07/16 1257  GLUCAP 115*    Imaging Dg Chest 2 View  03/07/2016  CLINICAL DATA:  82 year old female with a history of cough for 5 weeks EXAM: CHEST  2 VIEW COMPARISON:  02/05/2016, 01/20/2016, CT 03/06/2016 FINDINGS: Cardiomediastinal silhouette unchanged in size and contour. Calcifications of the aortic arch. Pattern of reticular nodular opacity with worsening opacities bilateral lungs most prominent in the mid left and mid right lungs. Opacity at the posterior base on the lateral view. Stigmata of emphysema, with increased retrosternal airspace, flattened hemidiaphragms, increased AP diameter, and hyperinflation on the AP view. Changes of bronchiectasis, present on comparison. IMPRESSION: Worsening multifocal interstitial/airspace opacity on a background of emphysema and chronic changes,  potentially representing edema and/or infection. Given nodular changes on recent CT study, at least a follow-up PA and lateral chest X-ray is recommended in 3-4 weeks following trial of antibiotic therapy to ensure resolution. Signed, Dulcy Fanny. Earleen Newport, DO [facility] Radiology Electronically Signed   By: Corrie Mckusick D.O.   On: 03/07/2016 14:40    ASSESSMENT / PLAN:  Ms. Searson is an 82 y/o woman with known ILD on home O2 who was admitted after heat exposure with recent abnormal findings on chest CT.   Pulmonary infiltrates These are not likely to be from her ILD process, and may represent evolving sequelae from her pneumonia she had in late April. She is coughing, but is having difficult mobilizing secretions sufficient for a sputum sample, but we did obtain a small amount. Given lack of generalized symptoms not better attributable to heat exposure, it is difficult to know the significance of these findings. It is possible that they represent infectious  emboli, and she is not completely immunocompetent given her chronic prednisone for PMR treatment. In the absence of symptoms, I do not think that fungal coverage is warranted. Furthermore, voriconazole is first-line therapy (aspergillus coverage is needed, not candida). I have ordered an aspergillus ag (serum), and may be useful in guiding therapy if she does develop symptoms. A bronchoscopy with BAL may be provide some guidance in making the diagnosis, but she is high risk and I do not believe is warranted given her clinical picture.  Luz Brazen, MD Pulmonary and [place] Pager: [phone]  03/07/2016, 9:02 PM

## 2016-03-07 NOTE — ED Notes (Signed)
Dr Eliseo Squires is changing patient to stepdown. Bed placement notified.

## 2016-03-07 NOTE — Progress Notes (Signed)
03/07/2016 patient transfer from the emergency room to 2central at [place]. She alert, oriented to person, place and was able to tell her birthday. Patient have a rash on the right perineal area. She received CHG bath, MRSA swab was obtain. Place on Telemetry, Elink and central monitoring was made aware patient was in room. [facility] RN.

## 2016-03-07 NOTE — ED Notes (Addendum)
Dr Eliseo Squires paged by this RN about pt's BP of 90/47, NP called back and stated that she was comfortable with the pt going to a telemetry unit. Dr Eliseo Squires in room now. Verbal order for 250cc NS bolus.
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
## 2016-03-07 NOTE — ED Notes (Signed)
Pt's daughter refused foley catheter placement. EDP notified.

## 2016-03-07 NOTE — Progress Notes (Signed)
After discussion with Dr Eliseo Squires it was decided to change bed request to step down secondary to social/family issues vs pathophys. Family extremely contrary due to past experiences with hospital. Are refusing some therapies.   BP low end of normal but stable and IV fluids ordered have not been started.    Dyanne Carrel, NP

## 2016-03-08 ENCOUNTER — Telehealth: Payer: Self-pay | Admitting: Pulmonary Disease

## 2016-03-08 DIAGNOSIS — B370 Candidal stomatitis: Secondary | ICD-10-CM

## 2016-03-08 DIAGNOSIS — J84112 Idiopathic pulmonary fibrosis: Secondary | ICD-10-CM

## 2016-03-08 DIAGNOSIS — J9621 Acute and chronic respiratory failure with hypoxia: Principal | ICD-10-CM

## 2016-03-08 DIAGNOSIS — J189 Pneumonia, unspecified organism: Secondary | ICD-10-CM

## 2016-03-08 LAB — CBC
HEMATOCRIT: 39.3 % (ref 36.0–46.0)
HEMOGLOBIN: 12.5 g/dL (ref 12.0–15.0)
MCH: 29 pg (ref 26.0–34.0)
MCHC: 31.8 g/dL (ref 30.0–36.0)
MCV: 91.2 fL (ref 78.0–100.0)
Platelets: 135 10*3/uL — ABNORMAL LOW (ref 150–400)
RBC: 4.31 MIL/uL (ref 3.87–5.11)
RDW: 17.3 % — AB (ref 11.5–15.5)
WBC: 7.2 10*3/uL (ref 4.0–10.5)

## 2016-03-08 LAB — BASIC METABOLIC PANEL
ANION GAP: 8 (ref 5–15)
BUN: 17 mg/dL (ref 6–20)
CHLORIDE: 104 mmol/L (ref 101–111)
CO2: 23 mmol/L (ref 22–32)
Calcium: 9.4 mg/dL (ref 8.9–10.3)
Creatinine, Ser: 0.75 mg/dL (ref 0.44–1.00)
GFR calc Af Amer: >60 mL/min (ref >60)
GLUCOSE: 169 mg/dL — AB (ref 65–99)
POTASSIUM: 3.9 mmol/L (ref 3.5–5.1)
Sodium: 135 mmol/L (ref 135–145)

## 2016-03-08 LAB — MRSA PCR SCREENING: MRSA by PCR: NEGATIVE

## 2016-03-08 LAB — GLUCOSE, CAPILLARY
GLUCOSE-CAPILLARY: 197 mg/dL — AB (ref 65–99)
Glucose-Capillary: 211 mg/dL — ABNORMAL HIGH (ref 65–99)

## 2016-03-08 MED ORDER — INSULIN ASPART 100 UNIT/ML ~~LOC~~ SOLN
0.0000 [IU] | Freq: Every day | SUBCUTANEOUS | Status: DC
  Administered 2016-03-08: 2 [IU] via SUBCUTANEOUS

## 2016-03-08 MED ORDER — INSULIN ASPART 100 UNIT/ML ~~LOC~~ SOLN
0.0000 [IU] | Freq: Three times a day (TID) | SUBCUTANEOUS | Status: DC
  Administered 2016-03-08: 2 [IU] via SUBCUTANEOUS
  Administered 2016-03-09: 1 [IU] via SUBCUTANEOUS
  Administered 2016-03-09: 2 [IU] via SUBCUTANEOUS

## 2016-03-08 MED ORDER — IPRATROPIUM-ALBUTEROL 0.5-2.5 (3) MG/3ML IN SOLN
3.0000 mL | Freq: Three times a day (TID) | RESPIRATORY_TRACT | Status: AC
  Administered 2016-03-08 – 2016-03-09 (×3): 3 mL via RESPIRATORY_TRACT
  Filled 2016-03-08 (×3): qty 3

## 2016-03-08 NOTE — Telephone Encounter (Signed)
Pl arrange FU Ov to discuss CT  Can add on 6/13 Tuesday 3.30 pm  Pl also obtain last Ov from her rheumatologist

## 2016-03-08 NOTE — Evaluation (Signed)
Physical Therapy Evaluation Patient Details Name: Martha Mcbride MRN: [id number] DOB: Feb 13, 1925 Today's Date: 03/08/2016   History of Present Illness  Pt is an 82 y/o F who presented to the ED after an episode of heat exposure.  She is coughing w/ difficulty mobilizing secretions.  Workup still underway.  She had been treated for a pneumonia in late April.  Pt's PMH inculdes polymyalgia, immune deficiency disorder, breast cancer, dementia, CHF, dysrhythmia.    Clinical Impression  Pt admitted with above diagnosis. Pt currently with functional limitations due to the deficits listed below (see PT Problem List). Ms. Nemeth presents w/ generalized weakness and currently requires min guard assist for transfers and ambulation and min assist for bed mobility.  Pt's daughter continues to refuse swallow evaluation despite education on risks and potential harmful consequences of aspirating. Pt will benefit from skilled PT to increase their independence and safety with mobility to allow discharge to the venue listed below.      Follow Up Recommendations Home health PT;Supervision/Assistance - 24 hour    Equipment Recommendations  None recommended by PT    Recommendations for Other Services OT consult     Precautions / Restrictions Precautions Precautions: Fall Precaution Comments: monitor O2; wear shoes when ambulating Restrictions Weight Bearing Restrictions: No      Mobility  Bed Mobility Overal bed mobility: Needs Assistance Bed Mobility: Supine to Sit     Supine to sit: Min assist;HOB elevated     General bed mobility comments: Assist as pt reaches out for thearpists' hand to pull up to sitting.   Transfers Overall transfer level: Needs assistance Equipment used: Rolling walker (2 wheeled) Transfers: Sit to/from Stand Sit to Stand: Min guard         General transfer comment: Cues for hand placement, pt is slow to stand.  Ambulation/Gait Ambulation/Gait assistance: Min  guard Ambulation Distance (Feet): 90 Feet Assistive device: Rolling walker (2 wheeled) Gait Pattern/deviations: Trunk flexed;Decreased stride length   Gait velocity interpretation: Below normal speed for age/gender General Gait Details: SpO2 as low as 90% on 3L O2.  Flexed posture and cues for managment of RW.  Pt begins to limp on Lt LE at end of ambulation due to pain Lt little toe.  Stairs            Wheelchair Mobility    Modified Rankin (Stroke Patients Only)       Balance Overall balance assessment: Needs assistance Sitting-balance support: No upper extremity supported;Feet supported Sitting balance-Leahy Scale: Fair     Standing balance support: No upper extremity supported;During functional activity Standing balance-Leahy Scale: Fair Standing balance comment: Able to pull up briefs standing EOB w/ min guard assist; however, bracing legs against side of bed for support.                             Pertinent Vitals/Pain Pain Assessment: Faces Faces Pain Scale: Hurts little more Pain Location: Lt little toe at end of ambulation (no changes on skin appreciated, likely due to pressure while walking w/o shoes) Pain Descriptors / Indicators: Discomfort Pain Intervention(s): Limited activity within patient's tolerance;Monitored during session;Repositioned    Home Living Family/patient expects to be discharged to:: Private residence Living Arrangements: Children (daughters take turns staying w/ pt) Available Help at Discharge: Family;Available 24 hours/day Type of Home: House Home Access: Stairs to enter Entrance Stairs-Rails: Left;Right;Can reach both Entrance Stairs-Number of Steps: 5 Home Layout: One level Home Equipment:  Shower seat;Grab bars - tub/shower;Hand held Tourist information centre manager - 2 wheels;Wheelchair - Press photographer;Bedside commode;Cane - single point;Cane - quad (handles on side of toilet; 3 wheeled walker) Additional Comments: Has a  ramp that they can place at door so pt does not need to use the steps.  Has used it in the past after returning home from SNF.    Prior Function Level of Independence: Independent with assistive device(s)         Comments: Over the past week she has been using a 3 wheeled walker but typically does not use AD.       Hand Dominance        Extremity/Trunk Assessment   Upper Extremity Assessment: Generalized weakness           Lower Extremity Assessment: Generalized weakness      Cervical / Trunk Assessment: Kyphotic;Other exceptions  Communication   Communication: HOH  Cognition Arousal/Alertness: Awake/alert Behavior During Therapy: WFL for tasks assessed/performed Overall Cognitive Status: History of cognitive impairments - at baseline                      General Comments General comments (skin integrity, edema, etc.): Pt coughing for ~2 minutes after taking a sip of water.  Suggested SLP evaluation to daughter who immediately refused.  Despite education on the risks and potentially harmful effects of aspirating, the daughter continues to refuse swallow evaluation saying, "I want to do what's best for my mom".  She expresses her frustrations w/ past swallow evaluations which her mother did not pass.     Exercises        Assessment/Plan    PT Assessment Patient needs continued PT services  PT Diagnosis Difficulty walking   PT Problem List Decreased strength;Decreased range of motion;Decreased activity tolerance;Decreased balance;Decreased cognition;Decreased knowledge of use of DME;Decreased safety awareness;Cardiopulmonary status limiting activity;Pain  PT Treatment Interventions DME instruction;Gait training;Functional mobility training;Therapeutic activities;Therapeutic exercise;Balance training;Cognitive remediation;Patient/family education   PT Goals (Current goals can be found in the Care Plan section) Acute Rehab PT Goals Patient Stated Goal: to go  home PT Goal Formulation: With patient/family Time For Goal Achievement: 03/22/16 Potential to Achieve Goals: Good    Frequency Min 3X/week   Barriers to discharge        Co-evaluation               End of Session Equipment Utilized During Treatment: Gait belt;Oxygen Activity Tolerance: Patient tolerated treatment well;Patient limited by fatigue Patient left: in chair;with call bell/phone within reach;with chair alarm set;with family/visitor present Nurse Communication: Mobility status;Other (comment) (pt needs swallow eval)         Time: 1105-1150 PT Time Calculation (min) (ACUTE ONLY): 45 min   Charges:   PT Evaluation $PT Eval Moderate Complexity: 1 Procedure PT Treatments $Gait Training: 8-22 mins $Therapeutic Activity: 8-22 mins   PT G Codes:       Collie Siad PT, DPT  Pager: [phone] Phone: [phone] 03/08/2016, 1:26 PM
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
## 2016-03-09 ENCOUNTER — Ambulatory Visit: Payer: Medicare Other | Admitting: Physician Assistant

## 2016-03-09 ENCOUNTER — Telehealth: Payer: Self-pay | Admitting: Cardiology

## 2016-03-09 DIAGNOSIS — E119 Type 2 diabetes mellitus without complications: Secondary | ICD-10-CM

## 2016-03-09 LAB — COMPREHENSIVE METABOLIC PANEL
ALBUMIN: 2.5 g/dL — AB (ref 3.5–5.0)
ALT: 19 U/L (ref 14–54)
ANION GAP: 6 (ref 5–15)
AST: 25 U/L (ref 15–41)
Alkaline Phosphatase: 50 U/L (ref 38–126)
BILIRUBIN TOTAL: 0.7 mg/dL (ref 0.3–1.2)
BUN: 21 mg/dL — ABNORMAL HIGH (ref 6–20)
CO2: 23 mmol/L (ref 22–32)
Calcium: 9.8 mg/dL (ref 8.9–10.3)
Chloride: 104 mmol/L (ref 101–111)
Creatinine, Ser: 0.65 mg/dL (ref 0.44–1.00)
GFR calc Af Amer: >60 mL/min (ref >60)
GFR calc non Af Amer: >60 mL/min (ref >60)
GLUCOSE: 176 mg/dL — AB (ref 65–99)
POTASSIUM: 4 mmol/L (ref 3.5–5.1)
Sodium: 133 mmol/L — ABNORMAL LOW (ref 135–145)
TOTAL PROTEIN: 6.7 g/dL (ref 6.5–8.1)

## 2016-03-09 LAB — CBC
HEMATOCRIT: 37.6 % (ref 36.0–46.0)
HEMOGLOBIN: 11.9 g/dL — AB (ref 12.0–15.0)
MCH: 28.7 pg (ref 26.0–34.0)
MCHC: 31.6 g/dL (ref 30.0–36.0)
MCV: 90.8 fL (ref 78.0–100.0)
Platelets: 132 10*3/uL — ABNORMAL LOW (ref 150–400)
RBC: 4.14 MIL/uL (ref 3.87–5.11)
RDW: 17.1 % — ABNORMAL HIGH (ref 11.5–15.5)
WBC: 8.3 10*3/uL (ref 4.0–10.5)

## 2016-03-09 LAB — GLUCOSE, CAPILLARY
GLUCOSE-CAPILLARY: 149 mg/dL — AB (ref 65–99)
GLUCOSE-CAPILLARY: 163 mg/dL — AB (ref 65–99)

## 2016-03-09 MED ORDER — AZITHROMYCIN 500 MG PO TABS: 500.0000 mg | ORAL_TABLET | ORAL | Status: DC

## 2016-03-09 MED ORDER — NYSTATIN 100000 UNIT/ML MT SUSP: 5.0000 mL | Freq: Four times a day (QID) | OROMUCOSAL | Status: AC

## 2016-03-09 MED ORDER — CEFUROXIME AXETIL 500 MG PO TABS: 500.0000 mg | ORAL_TABLET | Freq: Two times a day (BID) | ORAL | Status: AC

## 2016-03-09 MED ORDER — CEFUROXIME AXETIL 500 MG PO TABS
500.0000 mg | ORAL_TABLET | Freq: Two times a day (BID) | ORAL | Status: DC
  Administered 2016-03-09: 500 mg via ORAL
  Filled 2016-03-09: qty 1
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
## 2016-03-09 NOTE — Discharge Instructions (Signed)
Respiratory failure is when your lungs are not working well and your breathing (respiratory) system fails. When respiratory failure occurs, it is difficult for your lungs to get enough oxygen, get rid of carbon dioxide, or both. Respiratory failure can be life threatening.  °Respiratory failure can be acute or chronic. Acute respiratory failure is sudden, severe, and requires emergency medical treatment. Chronic respiratory failure is less severe, happens over time, and requires ongoing treatment.  °WHAT ARE THE CAUSES OF ACUTE RESPIRATORY FAILURE?  °Any problem affecting the heart or lungs can cause acute respiratory failure. Some of these causes include the following: °· Chronic bronchitis and emphysema (COPD).   °· Blood clot going to a lung (pulmonary embolism).   °· Having water in the lungs caused by heart failure, lung injury, or infection (pulmonary edema).   °· Collapsed lung (pneumothorax).   °· Pneumonia.   °· Pulmonary fibrosis.   °· Obesity.   °· Asthma.   °· Heart failure.   °· Any type of trauma to the chest that can make breathing difficult.   °· Nerve or muscle diseases making chest movements difficult. °HOW WILL MY ACUTE RESPIRATORY FAILURE BE TREATED?  °Treatment of acute respiratory failure depends on the cause of the respiratory failure. Usually, you will stay in the intensive care unit so your breathing can be watched closely. Treatment can include the following: °· Oxygen. Oxygen can be delivered through the following: °¨ Nasal cannula. This is small tubing that goes in your nose to give you oxygen. °¨ Face mask. A face mask covers your nose and mouth to give you oxygen. °· Medicine. Different medicines can be given to help with breathing. These can include: °¨ Nebulizers. Nebulizers deliver medicines to open the air passages (bronchodilators). These medicines help to open or relax the airways in the lungs so you can breathe better. They can also help loosen mucus from your  lungs. °¨ Diuretics. Diuretic medicines can help you breathe better by getting rid of extra water in your body. °¨ Steroids. Steroid medicines can help decrease swelling (inflammation) in your lungs. °¨ Antibiotics. °· Chest tube. If you have a collapsed lung (pneumothorax), a chest tube is placed to help reinflate the lung. °· Noninvasive positive pressure ventilation (NPPV). This is a tight-fitting mask that goes over your nose and mouth. The mask has tubing that is attached to a machine. The machine blows air into the tubing, which helps to keep the tiny air sacs (alveoli) in your lungs open. This machine allows you to breathe on your own. °· Ventilator. A ventilator is a breathing machine. When on a ventilator, a breathing tube is put into the lungs. A ventilator is used when you can no longer breathe well enough on your own. You may have low oxygen levels or high carbon dioxide (CO2) levels in your blood. When you are on a ventilator, sedation and pain medicines are given to make you sleep so your lungs can heal. °SEEK IMMEDIATE MEDICAL CARE IF: °· You have shortness of breath (dyspnea) with or without activity. °· You have rapid breathing (tachypnea). °· You are wheezing. °· You are unable to say more than a few words without having to catch your breath. °· You find it very difficult to function normally. °· You have a fast heart rate. °· You have a bluish color to your finger or toe nail beds. °· You have confusion or drowsiness or both. °  °This information is not intended to replace advice given to you by your health care provider. Make sure you discuss   any questions you have with your health care provider. °  °Document Released: 09/19/2013 Document Revised: 06/05/2015 Document Reviewed: 09/19/2013 °Elsevier Interactive Patient Education ©2016 [employer]vier Inc. ° °

## 2016-03-09 NOTE — Telephone Encounter (Signed)
New message     Pt c/o medication issue:  1. Name of Medication: lasix and Metoprolol  2. How are you currently taking this medication (dosage and times per day)? 20 mg po twice daily and 6.25 mg po twice daily  3. Are you having a reaction (difficulty breathing--STAT)? no  4. What is your medication issue? The hospital Md's stopped the two medications cause the pt's blood pressure was to low, the daughter concerns is should the pt be on this medications again.

## 2016-03-09 NOTE — Consult Note (Signed)
PULMONARY MEDICINE Consult   Name: Martha Mcbride MRN: [id number] DOB: 10/14/1924    ADMISSION DATE:  03/07/2016 CONSULTATION DATE:  March 09, 2016  REFERRING MD:  Cherene Altes, MD  CHIEF COMPLAINT:  Altered Mental Status  HISTORY OF PRESENT ILLNESS:   Martha Mcbride is an 82 y/o woman with a history of ILD (presumed IPF, sees Alva in clinic) as well as polymyalgia rheumatica on chronic prednisone (5 mg e/o day) who was brought to the ED after an episode of heat exposure. She had been treated for a pneumonia in late April with a week of levofloxacin, and in f/u with Dr. Elsworth Soho on 6/5, a f/u chest CT was ordered. This had some persistent lesions of unclear significance, so pulmonary medicine was consulted.   PAST MEDICAL HISTORY :  She  has a past medical history of Arthritis; Polymyalgia ([place]); Acid reflux; Immune deficiency disorder ([place]); Breast cancer ([place]); Giardia; Pinworms; History of hiatal hernia; Pneumonia; Dementia; CHF (congestive heart failure) ([place]); Dysrhythmia; and Diabetes mellitus without complication ([place]).  PAST SURGICAL HISTORY: She  has past surgical history that includes Breast lumpectomy; Partial hysterectomy; Tonsillectomy; TEE without cardioversion (N/A, 07/20/2014); and Abdominal hysterectomy.  Allergies  Allergen Reactions  . Adhesive [Tape] Other (See Comments)    Tears skin off, Please use "paper" tape  . Doxycycline Nausea And Vomiting  . Meperidine Other (See Comments)    Unknown  . Other Nausea And Vomiting and Other (See Comments)    Anesthesia makes her very sleepy and makes it hard for her to come out of it.   Marland Kitchen Penicillins Hives and Nausea And Vomiting    Has patient had a PCN reaction causing immediate rash, facial/tongue/throat swelling, SOB or lightheadedness with hypotension: Yes Has patient had a PCN reaction causing severe rash involving mucus membranes or skin necrosis: No Has patient had a PCN reaction that required hospitalization No Has  patient had a PCN reaction occurring within the last 10 years: No If all of the above answers are "NO", then may proceed with Cephalosporin use.   . Shellfish Allergy Nausea And Vomiting  . Sulfa Antibiotics Nausea And Vomiting  . Sulfamethoxazole Nausea And Vomiting  . Methotrexate Rash    Bleeding under the skin    No current facility-administered medications on file prior to encounter.   Current Outpatient Prescriptions on File Prior to Encounter  Medication Sig  . aspirin 81 MG tablet Take 81 mg by mouth daily.  . folic acid (FOLVITE) 1 MG tablet Take 1 mg by mouth daily.  . furosemide (LASIX) 20 MG tablet Take 20 mg by mouth 2 (two) times daily.   . hydroxypropyl methylcellulose / hypromellose (ISOPTO TEARS / GONIOVISC) 2.5 % ophthalmic solution Place 1 drop into both eyes daily as needed for dry eyes.   . metoprolol tartrate (LOPRESSOR) 25 MG tablet Take 6.25 mg by mouth 2 (two) times daily.   . potassium chloride 20 MEQ/15ML (10%) SOLN Take 20 mEq by mouth daily.  Marland Kitchen saccharomyces boulardii (FLORASTOR) 250 MG capsule Take 250 mg by mouth every morning.   . Levothyroxine Sodium (SYNTHROID PO) Take 25 mcg by mouth daily. Reported on 03/07/2016    FAMILY HISTORY:  Her indicated that her mother is deceased. She indicated that her father is deceased.   SOCIAL HISTORY: She  reports that she has never smoked. She has never used smokeless tobacco. She reports that she does not drink alcohol or use illicit drugs.  REVIEW OF SYSTEMS:  All  positives in bold Gen: Denies fever, chills, weight change, fatigue, night sweats HEENT: Denies blurred vision, double vision, hearing loss, tinnitus, sinus congestion, rhinorrhea, sore throat, neck stiffness, dysphagia PULM: Denies shortness of breath, cough, sputum production, hemoptysis, wheezing CV: Denies chest pain, edema, orthopnea, paroxysmal nocturnal dyspnea, palpitations GI: Denies abdominal pain, nausea, vomiting, diarrhea, hematochezia,  melena, constipation, change in bowel habits GU: Denies dysuria, hematuria, polyuria, oliguria, urethral discharge Endocrine: Denies hot or cold intolerance, polyuria, polyphagia or appetite change Derm: Denies rash, dry skin, scaling or peeling skin change Heme: Denies easy bruising, bleeding, bleeding gums Neuro: Denies headache, numbness, weakness, slurred speech, loss of memory or consciousness   SUBJECTIVE:  Pt is sitting in chair eating breakfast.  She states she feels better.  Her daughter is at the bedside and stated her mother has been standing with walker at bedside and has shown no signs of shortness of breath.  She is currently on RA with O2 sats 95%.  VITAL SIGNS: BP 107/55 mmHg  Pulse 72  Temp(Src) 97.8 F (36.6 C) (Axillary)  Resp 17  Ht 5' 2.99" (1.6 m)  Wt 136 lb 12.8 oz (62.052 kg)  BMI 24.24 kg/m2  SpO2 95%  HEMODYNAMICS:    VENTILATOR SETTINGS:    INTAKE / OUTPUT: I/O last 3 completed shifts: In: 1008 [P.O.:480; I.V.:528] Out: 1351 [Urine:1350; Stool:1]  PHYSICAL EXAMINATION: General:  Elderly woman in NAD Neuro:  Awake, alert and oriented follows commands  HEENT:  Moist mucous membranes, supple, no JVD Cardiovascular:  4/6 systolic "blowing" murmur, s1s2 Lungs:  Faint Basilar crackles Abdomen:  + BS x4, Soft, non distended, non tender Skin: intact   LABS:  BMET  Recent Labs Lab 03/07/16 1330 03/08/16 0335 03/09/16 0150  NA 132* 135 133*  K 4.0 3.9 4.0  CL 101 104 104  CO2 22 23 23   BUN 25* 17 21*  CREATININE 0.83 0.75 0.65  GLUCOSE 117* 169* 176*    Electrolytes  Recent Labs Lab 03/07/16 1330 03/08/16 0335 03/09/16 0150  CALCIUM 11.0* 9.4 9.8    CBC  Recent Labs Lab 03/07/16 1330 03/08/16 0335 03/09/16 0150  WBC 10.4 7.2 8.3  HGB 13.6 12.5 11.9*  HCT 42.1 39.3 37.6  PLT 135* 135* 132*    Coag's  Recent Labs Lab 03/07/16 1330  APTT 27  INR 1.25    Sepsis Markers  Recent Labs Lab 03/07/16 1340   LATICACIDVEN 1.77    ABG No results for input(s): PHART, PCO2ART, PO2ART in the last 168 hours.  Liver Enzymes  Recent Labs Lab 03/07/16 1330 03/09/16 0150  AST 30 25  ALT 15 19  ALKPHOS 61 50  BILITOT 1.7* 0.7  ALBUMIN 2.6* 2.5*    Cardiac Enzymes No results for input(s): TROPONINI, PROBNP in the last 168 hours.  Glucose  Recent Labs Lab 03/07/16 1257 03/08/16 1707 03/08/16 2117 03/09/16 0728  GLUCAP 115* 197* 211* 149*    Imaging No results found.   STUDIES: CT of chest 6/9>>Several (at least 5) new randomly distributed pulmonary nodules in both lungs measuring up to 2.0 cm in the right lower lobe, mostof which demonstrate ground-glass halos. Differential includes metastatic disease given the history of breast cancer versus atypical infection including fungal pneumonia given the ground-glass halos. Management options include short-term follow-up chest CT in 2-3 months, further evaluation with PET-CT and/or tissue sampling, as clinically warranted.  New bilateral axillary lymphadenopathy and increased moderate mediastinal and bilateral hilar adenopathy, nonspecific. Differential considerations include metastatic disease, lymphoproliferative disorder  or other systemic conditions including connective tissue disorders.These findings are superimposed on interstitial lung disease characterized by patchy subpleural reticulation and ground-glass attenuation, mild-to-moderate traction bronchiectasis and moderate honeycombing. These findings have mildly progressed since 07/30/2014 and are consistent with usual interstitial pneumonia (UIP).  CULTURES: Blood 6/10>> Sputum>>  ANTIBIOTICS: Azithromycin 6/10>> Rocephin 6/10>>  ASSESSMENT / PLAN: Idiopathic pulmonary fibrosis Pulmonary infiltrates Hx: Chronic diastolic heart failure EF 55%, DVT 2016 with IVC filter, Aspiration, Severe dysphagia w/ prior PEG Continue supplemental O2 as needed to maintain O2 sats 92% or above  or for dyspnea Continue nocturnal O2  Continue duonebs and antibiotics as listed above Early ambulation as tolerated Continue current diet family considering speech therapy but does not want aggressive treatment of dysphagia  Aspiration precautions Bronchoscopy with BAL may provide some guidance in making diagnosis but she is high risk therefore do not believe it is warrented given her clinical picture   Marda Stalker, Johnson, MD, PhD 03/09/2016, 4:08 PM [facility] [phone] or if no answer [phone]

## 2016-03-09 NOTE — Care Management Note (Signed)
Case Management Note  Patient Details  Name: DANIELLEMARIE NIKLAS MRN: [id number] Date of Birth: 09-Oct-1924  Subjective/Objective:    Pt lives alone but has 2 daughters who take turns staying with her so that she always has assistance.  PT recommends home therapy, discussed with pt and daughters and they agree.  Daughters are also now requesting Speech Therapy as they have noticed that pt is having trouble swallowing and do not want her to aspirate.  Provided list of home health agencies and referral called to [facility] per choice.                             Expected Discharge Plan:  [place]  Discharge planning Services  CM Consult  Post Acute Care Choice:  Home Health Choice offered to:  Adult Children  HH Arranged:  PT, Speech Therapy HH Agency:  [facility]  Status of Service:  Completed, signed off  Girard Cooter, [place] 03/09/2016, 12:36 PM

## 2016-03-10 ENCOUNTER — Encounter: Payer: Self-pay | Admitting: Pulmonary Disease

## 2016-03-10 ENCOUNTER — Ambulatory Visit (INDEPENDENT_AMBULATORY_CARE_PROVIDER_SITE_OTHER): Payer: Medicare Other | Admitting: Pulmonary Disease

## 2016-03-10 VITALS — BP 108/68 | HR 78 | Wt 134.0 lb

## 2016-03-10 DIAGNOSIS — J84112 Idiopathic pulmonary fibrosis: Secondary | ICD-10-CM

## 2016-03-10 DIAGNOSIS — R918 Other nonspecific abnormal finding of lung field: Secondary | ICD-10-CM | POA: Diagnosis not present

## 2016-03-10 NOTE — Telephone Encounter (Signed)
Attempted to contact pt. No answer, no option to leave a message. Will try back.  

## 2016-03-10 NOTE — Patient Instructions (Signed)
We reviewed CT scan finding of new nodules We discussed biopsy-risks and benefits  Please let me know how you would like to proceed  Lung fibrosis seems slightly worse compared to 2015-you may need oxygen in the future

## 2016-03-10 NOTE — Progress Notes (Signed)
Subjective:    Patient ID: Martha Mcbride, female    DOB: Jan 16, 1925, 82 y.o.   MRN: [id number]  HPI  82 y.o. never smoker for follow-up of interstitial lung disease presumed IPF, on nocturnal oxygen PMH significant for Polymyalgia on prednisone, severely calcified mitral valve with moderate stenosis, chronic diastolic heart failure,Gangrenous toes on the right foot   03/10/2016  Chief Complaint  Patient presents with  . Follow-up    Review CT results.    Accompanied by her daughter She was admitted 6/10-6/12 for heat exposure, she was sitting in the car and got overheated and family return to find her unresponsive  I reviewed CT chest from 03/06/2016 which showed several new pulmonary nodules in both lungs largest about 2 cm in the right lower lobe with some groundglass infiltrate around them and new bilateral axillary lymphadenopathy and hilar and mediastinal bilateral lymphadenopathy. Underlying ILD seemed progressed since 07/2014   On 5mg  qod pred- dr Charlestine Night She has done well, has been ambulatory, compliant with nocturnal oxygen Was treated for right lower lobe pneumonia by PCP-reviewed imaging that shows right lower lobe infiltrate with improvement on repeat x-ray on 5/10   Significant tests/ events  admission 10-27/15 -health care associated PNA, encephalopathy, and diastolic HF exacerbation.  Admitted 07/28/14 with fever 103, confusion,productive cough. WBC at 12, Lactic acid at 3.5. Chest x ray -diffuse bilateral pulmonary interstitial infiltrates.& eosinophilia (AEC 1800 on 10/22 which came down to 600 on 10/31). Diarrhea- resolved & felt unlikely to be parasitic by ID - stool O & P neg.   07/30/14 CT: Evidence of emphysema with traction bronchiectasis in multiple areas of interstitial fibrosis. This appearance is consistent with underlying usual interstitial pneumonitis. There also moderate pleural effusions.  04/2012 CT abd >>large hiatal hernia . Prominent  interstitial lung markings suggest chronic changes  06/2014 TEE mild MS, mod AS, nml LVfn Ova and parasite stool neg.  Strongyloids antibody neg Quantiferon Tb neg   swallowing evaluation- severe oropharyngeal dysphagia status post PEG placement   Admitted 07/2014 for recurrent aspiration pneumonia, complicated by severe sepsis and decompensated congestive heart failure. Following with wound center with sacral wounds.  08/2014 c -ANCA 1:80, ANA 1:40  PEG placed & then reversed   She was admitted again 09/2014 for sacral wounds, issues with adrenal insufficiency 10/20/14 venous duplex-bilateral DVT, IVC filter placed CT abdomen 10/16/14-moderate sliding hernia. TEE-moderate aortic stenosis   Past Medical History  Diagnosis Date  . Arthritis   . Polymyalgia ([place])   . Acid reflux   . Immune deficiency disorder ([facility])     autoimmune disorder - polymyalgia - on prednisone  . Breast cancer ([place])     Treated with lumpectomy and radiation  . Giardia   . Pinworms   . History of hiatal hernia   . Pneumonia   . Dementia   . CHF (congestive heart failure) ([place])   . Dysrhythmia   . Diabetes mellitus without complication ([place])      Review of Systems neg for any significant sore throat, dysphagia, itching, sneezing, nasal congestion or excess/ purulent secretions, fever, chills, sweats, unintended wt loss, pleuritic or exertional cp, hempoptysis, orthopnea pnd or change in chronic leg swelling. Also denies presyncope, palpitations, heartburn, abdominal pain, nausea, vomiting, diarrhea or change in bowel or urinary habits, dysuria,hematuria, rash, arthralgias, visual complaints, headache, numbness weakness or ataxia.     Objective:   Physical Exam   Gen. Pleasant, well-nourished, in no distress ENT - no lesions, no post  nasal drip Neck: No JVD, no thyromegaly, no carotid bruits Lungs: no use of accessory muscles, no dullness to percussion, clear without rales or rhonchi    Cardiovascular: Rhythm regular, heart sounds  normal, no murmurs or gallops, no peripheral edema Musculoskeletal: No deformities, no cyanosis or clubbing         Assessment & Plan:

## 2016-03-10 NOTE — Telephone Encounter (Signed)
Patient arrived as was told when discharged of follow up appointment with RA today at 3:30.  Scheduled appointment.

## 2016-03-11 ENCOUNTER — Telehealth: Payer: Self-pay | Admitting: Pulmonary Disease

## 2016-03-11 ENCOUNTER — Telehealth: Payer: Self-pay | Admitting: Cardiology

## 2016-03-11 DIAGNOSIS — R918 Other nonspecific abnormal finding of lung field: Secondary | ICD-10-CM | POA: Insufficient documentation

## 2016-03-11 LAB — ASPERGILLUS ANTIGEN, BAL/SERUM: ASPERGILLUS AG, BAL/SERUM: 0.05 {index} (ref 0.00–0.49)

## 2016-03-11 NOTE — Assessment & Plan Note (Signed)
We reviewed CT scan finding of new nodules The various options of biopsy including bronchoscopy, CT guided needle aspiration and surgical biopsy were discussed.The risks of each procedure including coughing, bleeding and the  chances of lung puncture requiring chest tube were discussed in great detail. The benefits & alternatives including serial follow up were also discussed.  Patient and her daughters would like to discuss and get back to [place] in 1 week Concern here is for metastatic cancer unclear origin? Breast primary We'll proceed with PET scan only if we are to proceed with biopsy

## 2016-03-11 NOTE — Assessment & Plan Note (Signed)
Lung fibrosis seems slightly worse compared to 2015-you may need oxygen in the future

## 2016-03-11 NOTE — Telephone Encounter (Signed)
New  Message    The pt was discharge from the hospital, on Monday and the MD there took her off the lasix and metoprolol.    Pt c/o medication issue:  1. Name of Medication: Lasix and Metoprolol   2. How are you currently taking this medication (dosage and times per day)? 20 mg po taking in the am and pm and 6.25 mg taking in the am and pm  3. Are you having a reaction (difficulty breathing--STAT)? no  4. What is your medication issue? The daughter was wanting the pt back on the Lasix and Metoprolol if possible

## 2016-03-11 NOTE — Telephone Encounter (Signed)
Spoke to daughter. Daughter states she did not need anything today - patient has upcoming appointment with Richardson Dopp

## 2016-03-11 NOTE — Telephone Encounter (Signed)
Per LOV note:  Pulmonary nodules/lesions, multiple - Martha Noel, MD at 03/11/2016 9:10 AM     Status: Written Related Problem: Pulmonary nodules/lesions, multiple   Expand All Collapse All    We reviewed CT scan finding of new nodules The various options of biopsy including bronchoscopy, CT guided needle aspiration and surgical biopsy were discussed.The risks of each procedure including coughing, bleeding and the chances of lung puncture requiring chest tube were discussed in great detail. The benefits & alternatives including serial follow up were also discussed.  Patient and her daughters would like to discuss and get back to [place] in 1 week Concern here is for metastatic cancer unclear origin? Breast primary We'll proceed with PET scan only if we are to proceed with biopsy         RA - it seems you mentioned possible bronch/biopsy. Pt's daughter would like to further discuss this with you in order to be able to make an informed decision about the procedure.   Would you be able to speak with the daughter? Please advise. Thanks!

## 2016-03-11 NOTE — Telephone Encounter (Signed)
SPOKE TO DAUGHTER ( [name])   DAUGHTER WANTED TO KNOW IF HER MOTHER NEED TO RESTART METOPROLOL TARTRATE AND LASIX HOME HEALTH NURSE SUGGESTED SHE CALLED. DOESN'T HAVE BLOOD PRESSURE READING FROM TODAY.  REVIEWED DISCHARGE SUMMARY. RN INSTRUCTED DAUGHTER TO KEEP A BLOOD PRESSURE READING LOG ( CAN TAKE UP TO TWICE A DAY) FOR THE NEXT WEEK . LOOK FOR SYMPTOMS OF SWELLING. CONTACT OFFICE NEXT WEEK OR SOONER IF NEEDED.  KEEP APPOINTMENT WITH DR Stanford Breed 04/09/16.

## 2016-03-12 NOTE — Telephone Encounter (Signed)
Resume lasix 20 mg daily, bmet and bnp one week Kirk Ruths

## 2016-03-12 NOTE — Telephone Encounter (Signed)
Low yield for bscopy - if we are doing procedure would suggest CT guided needle biopsy as we discussed in office

## 2016-03-12 NOTE — Telephone Encounter (Signed)
Left message for pt dtr to call  

## 2016-03-12 NOTE — Telephone Encounter (Signed)
Spoke with pt's daughter. She is aware of RA's response. [name] states that they will be back in touch with [place]. Nothing further was needed.

## 2016-03-13 ENCOUNTER — Telehealth: Payer: Self-pay | Admitting: Pulmonary Disease

## 2016-03-13 LAB — CULTURE, BLOOD (ROUTINE X 2)
CULTURE: NO GROWTH
CULTURE: NO GROWTH

## 2016-03-13 NOTE — Telephone Encounter (Signed)
Spoke with Alonna Minium with [facility]. She was calling for verbal orders to treat the pt for her swallowing issues. Ebony Hail states that RA was not the ordering physician on this swallowing eval. The doctor who ordered this eval can't get reached on the phone. Synthia Innocent that we could not give her these verbal orders as RA was not the one who ordering the swallowing eval. She verbalized understanding. Nothing further was needed.

## 2016-03-16 NOTE — Telephone Encounter (Signed)
Left message for pt to call.

## 2016-03-17 ENCOUNTER — Other Ambulatory Visit: Payer: Self-pay

## 2016-03-17 ENCOUNTER — Ambulatory Visit (HOSPITAL_COMMUNITY): Payer: Medicare Other | Attending: Cardiology

## 2016-03-17 ENCOUNTER — Telehealth: Payer: Self-pay | Admitting: Cardiology

## 2016-03-17 DIAGNOSIS — I350 Nonrheumatic aortic (valve) stenosis: Secondary | ICD-10-CM | POA: Insufficient documentation

## 2016-03-17 DIAGNOSIS — I050 Rheumatic mitral stenosis: Secondary | ICD-10-CM

## 2016-03-17 DIAGNOSIS — I517 Cardiomegaly: Secondary | ICD-10-CM | POA: Insufficient documentation

## 2016-03-17 DIAGNOSIS — I052 Rheumatic mitral stenosis with insufficiency: Secondary | ICD-10-CM | POA: Diagnosis not present

## 2016-03-17 DIAGNOSIS — I272 Other secondary pulmonary hypertension: Secondary | ICD-10-CM | POA: Insufficient documentation

## 2016-03-17 DIAGNOSIS — I5189 Other ill-defined heart diseases: Secondary | ICD-10-CM | POA: Insufficient documentation

## 2016-03-17 DIAGNOSIS — I5032 Chronic diastolic (congestive) heart failure: Secondary | ICD-10-CM

## 2016-03-17 DIAGNOSIS — E119 Type 2 diabetes mellitus without complications: Secondary | ICD-10-CM | POA: Diagnosis not present

## 2016-03-17 LAB — ECHOCARDIOGRAM COMPLETE
AV Area VTI index: 0.44 cm2/m2
AV Area VTI: 0.68 cm2
AV Mean grad: 76 mmHg
AV Peak grad: 120 mmHg
AV VEL mean LVOT/AV: 0.24
AV peak Index: 0.42
AV pk vel: 548 cm/s
AVAREAMEANV: 0.69 cm2
AVAREAMEANVIN: 0.42 cm2/m2
AVLVOTPG: 7 mmHg
Ao pk vel: 0.24 m/s
Area-P 1/2: 1.9 cm2
CHL CUP AV VALUE AREA INDEX: 0.44
CHL CUP AV VEL: 0.71
CHL CUP DOP CALC LVOT VTI: 31.9 cm
CHL CUP LVOT MV VTI INDEX: 1.08 cm2/m2
CHL CUP LVOT MV VTI: 1.76
CHL CUP MV DEC (S): 356
CHL CUP TV REG PEAK VELOCITY: 300 cm/s
DOP CAL AO MEAN VELOCITY: 410 cm/s
EERAT: 34.98
EWDT: 356 ms
FS: 37 % (ref 28–44)
IVS/LV PW RATIO, ED: 1.26
LA diam end sys: 45 mm
LA vol index: 40.5 mL/m2
LADIAMINDEX: 2.76 cm/m2
LASIZE: 45 mm
LAVOL: 66 mL
LAVOLA4C: 69 mL
LDCA: 2.84 cm2
LV E/e'average: 34.98
LV e' LATERAL: 5.26 cm/s
LVEEMED: 34.98
LVOT peak VTI: 0.25 cm
LVOTD: 19 mm
LVOTPV: 132 cm/s
LVOTSV: 91 mL
MV M vel: 117
MV Peak grad: 14 mmHg
MV pk A vel: 148 m/s
MVANNULUSVTI: 51.4 cm
MVPKEVEL: 184 m/s
Mean grad: 6 mmHg
P 1/2 time: 116 ms
PW: 12.2 mm — AB (ref 0.6–1.1)
TDI e' lateral: 5.26
TDI e' medial: 4.91
TR max vel: 300 cm/s
VTI: 127 cm
Valve area: 0.71 cm2

## 2016-03-17 NOTE — Telephone Encounter (Signed)
If she remains off lasix, then would not repeat labs. Kirk Ruths

## 2016-03-17 NOTE — Telephone Encounter (Signed)
Follow-up    Returning the nurses call. [facility] [place]. The daughter will need to know what to do before 11:30   Pt c/o medication issue:  1. Name of Medication: Metoprolol  and lasix   2. How are you currently taking this medication (dosage and times per day)? 6.25mg  twice daily and 20 mg po twice daily  3. Are you having a reaction (difficulty breathing--STAT)? no 4. What is your medication issue? The daughter wants to make sure the pt do or does not to be on the medication the hospital had taken the pt off the medications

## 2016-03-17 NOTE — Telephone Encounter (Signed)
Returned call to patient's daughter [name].Stated mother was in [place] recently and due to low B/P metoprolol and lasix stopped.Stated yesterday at PCP B/P 110/60.Stated that is what B/P has been averaging at home.Advised to continue to hold metoprolol and lasix.Advised to monitor B/P,weight.Advised if mother's B/P goes up and if weight goes up or she has swelling,sob to call back. Mother has appointment with Dr.Crenshaw 04/09/16, she wanted to know if she needed lab work before appointment.Advised I will sent message to [facility] for advice.   Stated she also wanted Dr.Crenshaw to be aware mother has 3 lung nodules.Stated she saw Dr.Alva recently and he wanted to do a biopsy.Stated given her age they did not want to have anything done.

## 2016-03-18 ENCOUNTER — Encounter: Payer: Self-pay | Admitting: Physician Assistant

## 2016-03-18 NOTE — Telephone Encounter (Signed)
Spoke with pt dtr, Aware of dr crenshaw's recommendations.  

## 2016-03-19 ENCOUNTER — Telehealth: Payer: Self-pay | Admitting: *Deleted

## 2016-03-19 NOTE — Telephone Encounter (Signed)
Daughter Martha Mcbride (see permanent comments) ok to s/w daughter Martha Mcbride. Daughter aware of echo results and findings by phone with verbal understanding. Daughter aware keep July appt w/Dr. Stanford Breed to discuss echo further.

## 2016-03-26 ENCOUNTER — Encounter (HOSPITAL_COMMUNITY): Payer: Self-pay

## 2016-03-26 ENCOUNTER — Emergency Department (HOSPITAL_COMMUNITY): Payer: Medicare Other

## 2016-03-26 ENCOUNTER — Inpatient Hospital Stay (HOSPITAL_COMMUNITY): Payer: Medicare Other

## 2016-03-26 ENCOUNTER — Inpatient Hospital Stay (HOSPITAL_COMMUNITY)
Admission: EM | Admit: 2016-03-26 | Discharge: 2016-04-28 | DRG: 870 | Disposition: E | Payer: Medicare Other | Attending: Pulmonary Disease | Admitting: Pulmonary Disease

## 2016-03-26 DIAGNOSIS — A419 Sepsis, unspecified organism: Principal | ICD-10-CM | POA: Diagnosis present

## 2016-03-26 DIAGNOSIS — J849 Interstitial pulmonary disease, unspecified: Secondary | ICD-10-CM | POA: Diagnosis present

## 2016-03-26 DIAGNOSIS — Z881 Allergy status to other antibiotic agents status: Secondary | ICD-10-CM | POA: Diagnosis not present

## 2016-03-26 DIAGNOSIS — Z880 Allergy status to penicillin: Secondary | ICD-10-CM

## 2016-03-26 DIAGNOSIS — Z888 Allergy status to other drugs, medicaments and biological substances status: Secondary | ICD-10-CM | POA: Diagnosis not present

## 2016-03-26 DIAGNOSIS — K226 Gastro-esophageal laceration-hemorrhage syndrome: Secondary | ICD-10-CM | POA: Diagnosis present

## 2016-03-26 DIAGNOSIS — I5033 Acute on chronic diastolic (congestive) heart failure: Secondary | ICD-10-CM | POA: Diagnosis present

## 2016-03-26 DIAGNOSIS — D849 Immunodeficiency, unspecified: Secondary | ICD-10-CM | POA: Diagnosis present

## 2016-03-26 DIAGNOSIS — Z923 Personal history of irradiation: Secondary | ICD-10-CM

## 2016-03-26 DIAGNOSIS — Z882 Allergy status to sulfonamides status: Secondary | ICD-10-CM | POA: Diagnosis not present

## 2016-03-26 DIAGNOSIS — I350 Nonrheumatic aortic (valve) stenosis: Secondary | ICD-10-CM | POA: Diagnosis not present

## 2016-03-26 DIAGNOSIS — E871 Hypo-osmolality and hyponatremia: Secondary | ICD-10-CM | POA: Diagnosis present

## 2016-03-26 DIAGNOSIS — Z853 Personal history of malignant neoplasm of breast: Secondary | ICD-10-CM

## 2016-03-26 DIAGNOSIS — R509 Fever, unspecified: Secondary | ICD-10-CM | POA: Diagnosis not present

## 2016-03-26 DIAGNOSIS — Z978 Presence of other specified devices: Secondary | ICD-10-CM

## 2016-03-26 DIAGNOSIS — K922 Gastrointestinal hemorrhage, unspecified: Secondary | ICD-10-CM

## 2016-03-26 DIAGNOSIS — J9601 Acute respiratory failure with hypoxia: Secondary | ICD-10-CM

## 2016-03-26 DIAGNOSIS — I4891 Unspecified atrial fibrillation: Secondary | ICD-10-CM | POA: Diagnosis not present

## 2016-03-26 DIAGNOSIS — J810 Acute pulmonary edema: Secondary | ICD-10-CM | POA: Diagnosis not present

## 2016-03-26 DIAGNOSIS — J9690 Respiratory failure, unspecified, unspecified whether with hypoxia or hypercapnia: Secondary | ICD-10-CM

## 2016-03-26 DIAGNOSIS — Z90710 Acquired absence of both cervix and uterus: Secondary | ICD-10-CM | POA: Diagnosis not present

## 2016-03-26 DIAGNOSIS — J9621 Acute and chronic respiratory failure with hypoxia: Secondary | ICD-10-CM | POA: Diagnosis present

## 2016-03-26 DIAGNOSIS — Z7982 Long term (current) use of aspirin: Secondary | ICD-10-CM

## 2016-03-26 DIAGNOSIS — R579 Shock, unspecified: Secondary | ICD-10-CM

## 2016-03-26 DIAGNOSIS — Z91013 Allergy to seafood: Secondary | ICD-10-CM | POA: Diagnosis not present

## 2016-03-26 DIAGNOSIS — F0390 Unspecified dementia without behavioral disturbance: Secondary | ICD-10-CM | POA: Diagnosis present

## 2016-03-26 DIAGNOSIS — E039 Hypothyroidism, unspecified: Secondary | ICD-10-CM | POA: Diagnosis present

## 2016-03-26 DIAGNOSIS — L8990 Pressure ulcer of unspecified site, unspecified stage: Secondary | ICD-10-CM | POA: Insufficient documentation

## 2016-03-26 DIAGNOSIS — Z7952 Long term (current) use of systemic steroids: Secondary | ICD-10-CM

## 2016-03-26 DIAGNOSIS — R0603 Acute respiratory distress: Secondary | ICD-10-CM | POA: Insufficient documentation

## 2016-03-26 DIAGNOSIS — Z885 Allergy status to narcotic agent status: Secondary | ICD-10-CM | POA: Diagnosis not present

## 2016-03-26 DIAGNOSIS — K5900 Constipation, unspecified: Secondary | ICD-10-CM | POA: Diagnosis present

## 2016-03-26 DIAGNOSIS — R578 Other shock: Secondary | ICD-10-CM | POA: Diagnosis present

## 2016-03-26 DIAGNOSIS — N179 Acute kidney failure, unspecified: Secondary | ICD-10-CM | POA: Diagnosis present

## 2016-03-26 DIAGNOSIS — G9340 Encephalopathy, unspecified: Secondary | ICD-10-CM | POA: Diagnosis present

## 2016-03-26 DIAGNOSIS — E872 Acidosis, unspecified: Secondary | ICD-10-CM | POA: Insufficient documentation

## 2016-03-26 DIAGNOSIS — K449 Diaphragmatic hernia without obstruction or gangrene: Secondary | ICD-10-CM | POA: Diagnosis present

## 2016-03-26 DIAGNOSIS — J84112 Idiopathic pulmonary fibrosis: Secondary | ICD-10-CM | POA: Diagnosis present

## 2016-03-26 DIAGNOSIS — K921 Melena: Secondary | ICD-10-CM | POA: Diagnosis present

## 2016-03-26 DIAGNOSIS — K219 Gastro-esophageal reflux disease without esophagitis: Secondary | ICD-10-CM | POA: Diagnosis present

## 2016-03-26 DIAGNOSIS — E119 Type 2 diabetes mellitus without complications: Secondary | ICD-10-CM | POA: Diagnosis present

## 2016-03-26 DIAGNOSIS — L89152 Pressure ulcer of sacral region, stage 2: Secondary | ICD-10-CM | POA: Diagnosis present

## 2016-03-26 DIAGNOSIS — E876 Hypokalemia: Secondary | ICD-10-CM | POA: Diagnosis not present

## 2016-03-26 DIAGNOSIS — M353 Polymyalgia rheumatica: Secondary | ICD-10-CM | POA: Diagnosis present

## 2016-03-26 DIAGNOSIS — J189 Pneumonia, unspecified organism: Secondary | ICD-10-CM | POA: Diagnosis not present

## 2016-03-26 DIAGNOSIS — Z515 Encounter for palliative care: Secondary | ICD-10-CM | POA: Diagnosis not present

## 2016-03-26 DIAGNOSIS — D649 Anemia, unspecified: Secondary | ICD-10-CM | POA: Diagnosis present

## 2016-03-26 DIAGNOSIS — Z66 Do not resuscitate: Secondary | ICD-10-CM | POA: Diagnosis present

## 2016-03-26 DIAGNOSIS — R109 Unspecified abdominal pain: Secondary | ICD-10-CM

## 2016-03-26 DIAGNOSIS — Z79899 Other long term (current) drug therapy: Secondary | ICD-10-CM

## 2016-03-26 DIAGNOSIS — Z91048 Other nonmedicinal substance allergy status: Secondary | ICD-10-CM

## 2016-03-26 DIAGNOSIS — R112 Nausea with vomiting, unspecified: Secondary | ICD-10-CM

## 2016-03-26 LAB — CBC
HCT: 33.4 % — ABNORMAL LOW (ref 36.0–46.0)
HEMOGLOBIN: 11.2 g/dL — AB (ref 12.0–15.0)
MCH: 30 pg (ref 26.0–34.0)
MCHC: 33.5 g/dL (ref 30.0–36.0)
MCV: 89.5 fL (ref 78.0–100.0)
Platelets: 116 10*3/uL — ABNORMAL LOW (ref 150–400)
RBC: 3.73 MIL/uL — AB (ref 3.87–5.11)
RDW: 16.6 % — ABNORMAL HIGH (ref 11.5–15.5)
WBC: 22.2 10*3/uL — ABNORMAL HIGH (ref 4.0–10.5)

## 2016-03-26 LAB — I-STAT CHEM 8, ED
BUN: 44 mg/dL — ABNORMAL HIGH (ref 6–20)
CREATININE: 0.8 mg/dL (ref 0.44–1.00)
Calcium, Ion: 1.11 mmol/L — ABNORMAL LOW (ref 1.12–1.23)
Chloride: 98 mmol/L — ABNORMAL LOW (ref 101–111)
Glucose, Bld: 109 mg/dL — ABNORMAL HIGH (ref 65–99)
HEMATOCRIT: 35 % — AB (ref 36.0–46.0)
HEMOGLOBIN: 11.9 g/dL — AB (ref 12.0–15.0)
POTASSIUM: 4.8 mmol/L (ref 3.5–5.1)
SODIUM: 127 mmol/L — AB (ref 135–145)
TCO2: 19 mmol/L (ref 0–100)

## 2016-03-26 LAB — URINALYSIS, ROUTINE W REFLEX MICROSCOPIC
BILIRUBIN URINE: NEGATIVE
GLUCOSE, UA: NEGATIVE mg/dL
HGB URINE DIPSTICK: NEGATIVE
Ketones, ur: NEGATIVE mg/dL
Leukocytes, UA: NEGATIVE
Nitrite: NEGATIVE
PH: 5.5 (ref 5.0–8.0)
Protein, ur: NEGATIVE mg/dL
SPECIFIC GRAVITY, URINE: 1.022 (ref 1.005–1.030)

## 2016-03-26 LAB — I-STAT ARTERIAL BLOOD GAS, ED
Acid-base deficit: 4 mmol/L — ABNORMAL HIGH (ref 0.0–2.0)
BICARBONATE: 20.9 meq/L (ref 20.0–24.0)
O2 SAT: 99 %
PCO2 ART: 39.2 mmHg (ref 35.0–45.0)
PO2 ART: 137 mmHg — AB (ref 80.0–100.0)
Patient temperature: 38.3
TCO2: 22 mmol/L (ref 0–100)
pH, Arterial: 7.341 — ABNORMAL LOW (ref 7.350–7.450)

## 2016-03-26 LAB — PREPARE FRESH FROZEN PLASMA
Unit division: 0
Unit division: 0

## 2016-03-26 LAB — CBC WITH DIFFERENTIAL/PLATELET
BASOS ABS: 0.2 10*3/uL — AB (ref 0.0–0.1)
BASOS PCT: 1 %
Blasts: 3 %
EOS PCT: 6 %
Eosinophils Absolute: 1.5 10*3/uL — ABNORMAL HIGH (ref 0.0–0.7)
HCT: 33.8 % — ABNORMAL LOW (ref 36.0–46.0)
HEMOGLOBIN: 11.3 g/dL — AB (ref 12.0–15.0)
LYMPHS PCT: 34 %
Lymphs Abs: 8.3 10*3/uL — ABNORMAL HIGH (ref 0.7–4.0)
MCH: 30.5 pg (ref 26.0–34.0)
MCHC: 33.4 g/dL (ref 30.0–36.0)
MCV: 91.4 fL (ref 78.0–100.0)
MONOS PCT: 4 %
Monocytes Absolute: 1 10*3/uL (ref 0.1–1.0)
NEUTROS PCT: 52 %
Neutro Abs: 12.7 10*3/uL — ABNORMAL HIGH (ref 1.7–7.7)
Platelets: 142 10*3/uL — ABNORMAL LOW (ref 150–400)
RBC: 3.7 MIL/uL — AB (ref 3.87–5.11)
RDW: 17.8 % — ABNORMAL HIGH (ref 11.5–15.5)
WBC: 24.4 10*3/uL — AB (ref 4.0–10.5)

## 2016-03-26 LAB — TRIGLYCERIDES: TRIGLYCERIDES: 135 mg/dL (ref <150)

## 2016-03-26 LAB — COMPREHENSIVE METABOLIC PANEL
ALBUMIN: 2.4 g/dL — AB (ref 3.5–5.0)
ALT: 18 U/L (ref 14–54)
AST: 45 U/L — AB (ref 15–41)
Alkaline Phosphatase: 104 U/L (ref 38–126)
Anion gap: 15 (ref 5–15)
BILIRUBIN TOTAL: 1.4 mg/dL — AB (ref 0.3–1.2)
BUN: 45 mg/dL — AB (ref 6–20)
CHLORIDE: 95 mmol/L — AB (ref 101–111)
CO2: 19 mmol/L — ABNORMAL LOW (ref 22–32)
CREATININE: 1.03 mg/dL — AB (ref 0.44–1.00)
Calcium: 10 mg/dL (ref 8.9–10.3)
GFR calc Af Amer: 55 mL/min — ABNORMAL LOW (ref >60)
GFR, EST NON AFRICAN AMERICAN: 47 mL/min — AB (ref >60)
GLUCOSE: 111 mg/dL — AB (ref 65–99)
POTASSIUM: 5.1 mmol/L (ref 3.5–5.1)
Sodium: 129 mmol/L — ABNORMAL LOW (ref 135–145)
Total Protein: 6.5 g/dL (ref 6.5–8.1)

## 2016-03-26 LAB — APTT: aPTT: 35 seconds (ref 24–37)

## 2016-03-26 LAB — PREPARE RBC (CROSSMATCH)

## 2016-03-26 LAB — I-STAT CG4 LACTIC ACID, ED: LACTIC ACID, VENOUS: 8.55 mmol/L — AB (ref 0.5–1.9)

## 2016-03-26 LAB — PROTIME-INR
INR: 1.31 (ref 0.00–1.49)
Prothrombin Time: 16.4 seconds — ABNORMAL HIGH (ref 11.6–15.2)

## 2016-03-26 LAB — CBG MONITORING, ED: Glucose-Capillary: 113 mg/dL — ABNORMAL HIGH (ref 65–99)

## 2016-03-26 LAB — POC OCCULT BLOOD, ED: FECAL OCCULT BLD: POSITIVE — AB

## 2016-03-26 LAB — LACTIC ACID, PLASMA: Lactic Acid, Venous: 3.8 mmol/L (ref 0.5–1.9)

## 2016-03-26 LAB — LIPASE, BLOOD: LIPASE: 25 U/L (ref 11–51)

## 2016-03-26 LAB — LACTATE DEHYDROGENASE: LDH: 411 U/L — ABNORMAL HIGH (ref 98–192)

## 2016-03-26 LAB — TSH: TSH: 3.787 u[IU]/mL (ref 0.350–4.500)

## 2016-03-26 LAB — ABO/RH: ABO/RH(D): O NEG

## 2016-03-26 LAB — TROPONIN I: TROPONIN I: 0.17 ng/mL — AB (ref <0.03)

## 2016-03-26 MED ORDER — SODIUM CHLORIDE 0.9 % IV SOLN
10.0000 ug/h | INTRAVENOUS | Status: DC
  Administered 2016-03-26: 10 ug/h via INTRAVENOUS
  Filled 2016-03-26: qty 50

## 2016-03-26 MED ORDER — HYDROCORTISONE NA SUCCINATE PF 100 MG IJ SOLR
50.0000 mg | Freq: Four times a day (QID) | INTRAMUSCULAR | Status: DC
  Administered 2016-03-26 – 2016-03-28 (×7): 50 mg via INTRAVENOUS
  Filled 2016-03-26 (×2): qty 2
  Filled 2016-03-26 (×6): qty 1
  Filled 2016-03-26: qty 2

## 2016-03-26 MED ORDER — SODIUM CHLORIDE 0.9 % IV SOLN
INTRAVENOUS | Status: DC
  Administered 2016-03-27 (×2): via INTRAVENOUS
  Administered 2016-03-28: 100 mL/h via INTRAVENOUS

## 2016-03-26 MED ORDER — VANCOMYCIN HCL IN DEXTROSE 1-5 GM/200ML-% IV SOLN
1000.0000 mg | Freq: Once | INTRAVENOUS | Status: AC
  Administered 2016-03-26: 1000 mg via INTRAVENOUS
  Filled 2016-03-26: qty 200

## 2016-03-26 MED ORDER — SODIUM CHLORIDE 0.9 % IV BOLUS (SEPSIS)
1000.0000 mL | Freq: Once | INTRAVENOUS | Status: AC
  Administered 2016-03-26: 1000 mL via INTRAVENOUS

## 2016-03-26 MED ORDER — PIPERACILLIN-TAZOBACTAM 3.375 G IVPB 30 MIN
3.3750 g | Freq: Once | INTRAVENOUS | Status: DC
  Administered 2016-03-26: 3.375 g via INTRAVENOUS
  Filled 2016-03-26: qty 50

## 2016-03-26 MED ORDER — SODIUM CHLORIDE 0.9 % IV SOLN
INTRAVENOUS | Status: DC
  Administered 2016-03-26: 22:00:00 via INTRAVENOUS

## 2016-03-26 MED ORDER — MIDAZOLAM HCL 2 MG/2ML IJ SOLN: 1.0000 mg | INTRAMUSCULAR | Status: DC | PRN

## 2016-03-26 MED ORDER — IOPAMIDOL (ISOVUE-370) INJECTION 76%
100.0000 mL | Freq: Once | INTRAVENOUS | Status: AC | PRN
  Administered 2016-03-26: 100 mL via INTRAVENOUS

## 2016-03-26 MED ORDER — SODIUM CHLORIDE 0.9 % IV SOLN: 10.0000 mL/h | Freq: Once | INTRAVENOUS | Status: DC

## 2016-03-26 MED ORDER — ETOMIDATE 2 MG/ML IV SOLN
INTRAVENOUS | Status: DC | PRN
  Administered 2016-03-26: 15 mg via INTRAVENOUS

## 2016-03-26 MED ORDER — PANTOPRAZOLE SODIUM 40 MG IV SOLR
80.0000 mg | Freq: Every day | INTRAVENOUS | Status: DC
  Administered 2016-03-26 – 2016-03-27 (×2): 80 mg via INTRAVENOUS
  Filled 2016-03-26 (×4): qty 80

## 2016-03-26 MED ORDER — SODIUM CHLORIDE 0.9 % IV SOLN
250.0000 mL | INTRAVENOUS | Status: DC | PRN
  Administered 2016-03-27: 250 mL via INTRAVENOUS

## 2016-03-26 MED ORDER — FENTANYL CITRATE (PF) 100 MCG/2ML IJ SOLN
INTRAMUSCULAR | Status: DC | PRN
  Administered 2016-03-26 (×2): 50 ug via INTRAVENOUS

## 2016-03-26 MED ORDER — IOPAMIDOL (ISOVUE-370) INJECTION 76%
INTRAVENOUS | Status: AC
  Filled 2016-03-26: qty 100

## 2016-03-26 MED ORDER — SODIUM CHLORIDE 0.9 % IV SOLN
1.0000 g | Freq: Once | INTRAVENOUS | Status: AC
  Administered 2016-03-26: 1 g via INTRAVENOUS
  Filled 2016-03-26: qty 10

## 2016-03-26 MED ORDER — LORAZEPAM 2 MG/ML IJ SOLN
INTRAMUSCULAR | Status: AC
  Filled 2016-03-26: qty 1

## 2016-03-26 MED ORDER — LEVOTHYROXINE SODIUM 100 MCG IV SOLR: 12.5000 ug | Freq: Every day | INTRAVENOUS | Status: DC

## 2016-03-26 MED ORDER — PANTOPRAZOLE SODIUM 40 MG IV SOLR
8.0000 mg/h | INTRAVENOUS | Status: DC
  Administered 2016-03-26 – 2016-03-29 (×6): 8 mg/h via INTRAVENOUS
  Filled 2016-03-26 (×19): qty 80

## 2016-03-26 MED ORDER — FENTANYL CITRATE (PF) 100 MCG/2ML IJ SOLN: 50.0000 ug | Freq: Once | INTRAMUSCULAR | Status: DC

## 2016-03-26 MED ORDER — LORAZEPAM 2 MG/ML IJ SOLN
INTRAMUSCULAR | Status: DC | PRN
  Administered 2016-03-26: 2 mg via INTRAVENOUS

## 2016-03-26 MED ORDER — SODIUM CHLORIDE 0.9 % IV SOLN
INTRAVENOUS | Status: DC | PRN
  Administered 2016-03-26 (×2): 1000 mL via INTRAVENOUS

## 2016-03-26 MED ORDER — PROPOFOL 1000 MG/100ML IV EMUL
0.0000 ug/kg/min | INTRAVENOUS | Status: DC
  Filled 2016-03-26: qty 100

## 2016-03-26 MED ORDER — DIATRIZOATE MEGLUMINE & SODIUM 66-10 % PO SOLN
ORAL | Status: AC
  Filled 2016-03-26: qty 30

## 2016-03-26 MED ORDER — NOREPINEPHRINE BITARTRATE 1 MG/ML IV SOLN
0.0000 ug/min | Freq: Once | INTRAVENOUS | Status: AC
  Administered 2016-03-26: 2 ug/min via INTRAVENOUS
  Filled 2016-03-26: qty 4

## 2016-03-26 MED ORDER — FENTANYL CITRATE (PF) 100 MCG/2ML IJ SOLN
INTRAMUSCULAR | Status: AC
  Filled 2016-03-26: qty 2

## 2016-03-26 MED ORDER — SUCCINYLCHOLINE CHLORIDE 20 MG/ML IJ SOLN
INTRAMUSCULAR | Status: DC | PRN
  Administered 2016-03-26: 80 mg via INTRAVENOUS

## 2016-03-26 MED ORDER — PANTOPRAZOLE SODIUM 40 MG IV SOLR: 40.0000 mg | Freq: Once | INTRAVENOUS | Status: DC

## 2016-03-26 MED ORDER — SODIUM CHLORIDE 0.9 % IV SOLN
25.0000 ug/h | INTRAVENOUS | Status: DC
  Administered 2016-03-26: 25 ug/h via INTRAVENOUS
  Filled 2016-03-26 (×2): qty 50

## 2016-03-26 MED ORDER — LORAZEPAM 2 MG/ML IJ SOLN: 2.0000 mg | Freq: Once | INTRAMUSCULAR | Status: DC

## 2016-03-26 MED ORDER — FENTANYL BOLUS VIA INFUSION
25.0000 ug | INTRAVENOUS | Status: DC | PRN
Start: 2016-03-26 — End: 2016-03-28
  Filled 2016-03-26: qty 25

## 2016-03-26 MED ORDER — INSULIN ASPART 100 UNIT/ML ~~LOC~~ SOLN
0.0000 [IU] | SUBCUTANEOUS | Status: DC
  Administered 2016-03-27: 2 [IU] via SUBCUTANEOUS
  Administered 2016-03-27: 1 [IU] via SUBCUTANEOUS
  Administered 2016-03-27: 3 [IU] via SUBCUTANEOUS
  Administered 2016-03-27 (×3): 2 [IU] via SUBCUTANEOUS
  Administered 2016-03-28 (×2): 1 [IU] via SUBCUTANEOUS
  Administered 2016-03-28: 2 [IU] via SUBCUTANEOUS
  Administered 2016-03-28 (×2): 1 [IU] via SUBCUTANEOUS
  Administered 2016-03-29: 3 [IU] via SUBCUTANEOUS
  Administered 2016-03-29 (×2): 2 [IU] via SUBCUTANEOUS
  Administered 2016-03-29 (×2): 1 [IU] via SUBCUTANEOUS
  Administered 2016-03-29 – 2016-03-30 (×4): 2 [IU] via SUBCUTANEOUS
  Administered 2016-03-30 (×2): 1 [IU] via SUBCUTANEOUS
  Administered 2016-03-30: 2 [IU] via SUBCUTANEOUS
  Administered 2016-03-31: 1 [IU] via SUBCUTANEOUS
  Administered 2016-03-31: 3 [IU] via SUBCUTANEOUS
  Administered 2016-03-31 – 2016-04-02 (×12): 2 [IU] via SUBCUTANEOUS
  Administered 2016-04-02: 3 [IU] via SUBCUTANEOUS

## 2016-03-26 NOTE — Progress Notes (Signed)
Pt intubated by EDP for airway protection. 7.5 ETT secured at 22 at lip. Posistve color change on ETCO2. BBS clear and equal. Vital signs stable. CXR in progress. Vent settings are PRVC 420/18/60/5. Will cont to monitor.

## 2016-03-26 NOTE — Progress Notes (Addendum)
Pharmacy Antibiotic Note  Martha Mcbride is a 82 y.o. female admitted on 03/27/2016 with sepsis.  Pharmacy has been consulted for vancomycin and zosyn dosing. Pt presents vomiting BRB and black tarry stool. Pt has reported allergy to penicillins of rash but already had zosyn in progress. Spoke with nurse and will monitor patient for reaction.   Pt received vancomycin 1g and zosyn 3.375g IV once in the ED.  Plan: Vancomycin 500mg  IV every 12 hours.  Goal trough 15-20 mcg/mL. Zosyn 3.375g IV q8h (4 hour infusion).  Monitor culture data, renal function and clinical course VT at SS prn  Height: 5\' 3"  (160 cm) Weight: 130 lb (58.968 kg) IBW/kg (Calculated) : 52.4  Temp (24hrs), Avg:101.5 F (38.6 C), Min:101.5 F (38.6 C), Max:101.5 F (38.6 C)   Recent Labs Lab 03/10/2016 1740 03/25/2016 1741  CREATININE 0.80  --   LATICACIDVEN  --  8.55*    Estimated Creatinine Clearance: 40.2 mL/min (by C-G formula based on Cr of 0.8).    Allergies  Allergen Reactions  . Adhesive [Tape] Other (See Comments)    Tears skin off, Please use "paper" tape  . Doxycycline Nausea And Vomiting  . Meperidine Other (See Comments)    Unknown  . Other Nausea And Vomiting and Other (See Comments)    Anesthesia makes her very sleepy and makes it hard for her to come out of it.   Marland Kitchen Penicillins Hives and Nausea And Vomiting    Has patient had a PCN reaction causing immediate rash, facial/tongue/throat swelling, SOB or lightheadedness with hypotension: Yes Has patient had a PCN reaction causing severe rash involving mucus membranes or skin necrosis: No Has patient had a PCN reaction that required hospitalization No Has patient had a PCN reaction occurring within the last 10 years: No If all of the above answers are "NO", then may proceed with Cephalosporin use.   . Shellfish Allergy Nausea And Vomiting  . Sulfa Antibiotics Nausea And Vomiting  . Sulfamethoxazole Nausea And Vomiting  . Methotrexate Rash     Bleeding under the skin    Antimicrobials this admission: Vanc 6/29 >>  Zosyn 6/29 >>   Dose adjustments this admission: n/a  Microbiology results:  BCx:   UCx:    Sputum:    MRSA PCR:    Andrey Cota. Diona Foley, PharmD, [place] Clinical Pharmacist Pager [phone] 03/14/2016 6:07 PM

## 2016-03-26 NOTE — ED Notes (Signed)
Belongings (clothes and dentures) given to family.

## 2016-03-26 NOTE — ED Notes (Signed)
Pt hr and bp increasing.  Sedation requested from md.

## 2016-03-26 NOTE — Progress Notes (Signed)
Chaplain responded to a page to give family emotional and spiritual support. Chaplain provided water and coffee.

## 2016-03-26 NOTE — H&P (Signed)
PULMONARY / CRITICAL CARE MEDICINE   Name: Martha Mcbride MRN: [id number] DOB: 1925-06-01    ADMISSION DATE:  03/07/2016 CONSULTATION DATE:  03/24/2016  REFERRING MD:  Kathrynn Humble (EDP)  CHIEF COMPLAINT:  GI Bleed  HISTORY OF PRESENT ILLNESS:  Pt is encephelopathic; therefore, this HPI is obtained from chart review. Martha Mcbride is a 82 y.o. female with PMH as outlined below including ILD for which she is on nocturnal O2 and is followed by Dr. Elsworth Soho.  She presented to [facility] ED 06/29 with bloody emesis and black tarry stools.  Pt was reportedly constipated for several days prior so was given an enema.  She then had large BM consisting of melena followed by hematemesis.  In ED, she was minimally responsive therefore was intubated for airway protection.  She was given 2u PRBC empirically and 3L IVF.  Initial Hgb 11.3, Hct 33.8.  Post intubation, she was hypotensive; therefore, propofol was discontinued.  Dr. Paulita Fujita with [facility] was consulted by EDP.  Pt is on daily ASA 81mg , no additional anti-platelets or anticoagulants.  She had recent admission 03/07/16 through 03/09/16   PAST MEDICAL HISTORY :  She  has a past medical history of Arthritis; Polymyalgia ([place]); Acid reflux; Immune deficiency disorder ([place]); Breast cancer ([place]); Giardia; Pinworms; History of hiatal hernia; Pneumonia; Dementia; CHF (congestive heart failure) ([place]); Dysrhythmia; Diabetes mellitus without complication ([place]); and Aortic stenosis.  PAST SURGICAL HISTORY: She  has past surgical history that includes Breast lumpectomy; Partial hysterectomy; Tonsillectomy; TEE without cardioversion (N/A, 07/20/2014); and Abdominal hysterectomy.  Allergies  Allergen Reactions  . Adhesive [Tape] Other (See Comments)    Tears skin off, Please use "paper" tape  . Doxycycline Nausea And Vomiting  . Other Nausea And Vomiting and Other (See Comments)    Anesthesia makes her very sleepy and makes it hard for her to come out of it.   Marland Kitchen  Penicillins Hives and Nausea And Vomiting    Has patient had a PCN reaction causing immediate rash, facial/tongue/throat swelling, SOB or lightheadedness with hypotension: Yes Has patient had a PCN reaction causing severe rash involving mucus membranes or skin necrosis: No Has patient had a PCN reaction that required hospitalization No Has patient had a PCN reaction occurring within the last 10 years: No If all of the above answers are "NO", then may proceed with Cephalosporin use.   . Shellfish Allergy Nausea And Vomiting  . Sulfa Antibiotics Nausea And Vomiting  . Sulfamethoxazole Nausea And Vomiting  . Meperidine Hives and Rash    Unknown  . Methotrexate Rash    Bleeding under the skin    No current facility-administered medications on file prior to encounter.   Current Outpatient Prescriptions on File Prior to Encounter  Medication Sig  . aspirin 81 MG tablet Take 81 mg by mouth daily.  Marland Kitchen BIOTIN PO Take 1 tablet by mouth daily.  . cefUROXime (CEFTIN) 500 MG tablet Take 1 tablet (500 mg total) by mouth 2 (two) times daily with a meal.  . Cholecalciferol (VITAMIN D3) 2000 units capsule Take 2,000 Units by mouth daily.  . Ferrous Sulfate (IRON) 28 MG TABS Take 1 tablet by mouth daily.  . folic acid (FOLVITE) 1 MG tablet Take 1 mg by mouth daily.  . hydroxypropyl methylcellulose / hypromellose (ISOPTO TEARS / GONIOVISC) 2.5 % ophthalmic solution Place 1 drop into both eyes daily as needed for dry eyes.   Marland Kitchen nystatin (MYCOSTATIN) 100000 UNIT/ML suspension Take 5 mLs (500,000 Units total) by  mouth 4 (four) times daily.  . predniSONE (DELTASONE) 5 MG tablet Take 5 mg by mouth every other day.  . RABEprazole (ACIPHEX) 20 MG tablet Take 20 mg by mouth every other day.  . saccharomyces boulardii (FLORASTOR) 250 MG capsule Take 250 mg by mouth every morning.   . triamcinolone cream (KENALOG) 0.1 % Apply 1 application topically daily as needed (for itching).     FAMILY HISTORY:  Her  indicated that her mother is deceased. She indicated that her father is deceased.   SOCIAL HISTORY: She  reports that she has never smoked. She has never used smokeless tobacco. She reports that she does not drink alcohol or use illicit drugs.  REVIEW OF SYSTEMS:  Unable to obtain as pt is encephalopathic.  SUBJECTIVE: On vent, unresponsive.  VITAL SIGNS: BP 74/43 mmHg  Pulse 112  Temp(Src) 101.5 F (38.6 C) (Axillary)  Resp 27  Ht 5\' 3"  (1.6 m)  Wt 130 lb (58.968 kg)  BMI 23.03 kg/m2  SpO2 98%  HEMODYNAMICS:    VENTILATOR SETTINGS: Vent Mode:  [-] PRVC FiO2 (%):  [60 %] 60 % Set Rate:  [18 bmp] 18 bmp Vt Set:  [420 mL] 420 mL PEEP:  [5 cmH20] 5 cmH20 Plateau Pressure:  [18 cmH20] 18 cmH20  INTAKE / OUTPUT: I/O last 3 completed shifts: In: 2555 [I.V.:2279; Blood:276] Out: 425 [Urine:425]   PHYSICAL EXAMINATION: General: Elderly chronically ill appearing female, critically ill. Neuro: Sedated, non-responsive. HEENT: [place]/AT. PERRL, sclerae anicteric. Cardiovascular: RRR, 3/6 SEM radiating into carotids.  Lungs: Respirations even and unlabored.  Coarse bilaterally. Abdomen: BS x 4, soft, NT/ND.  Musculoskeletal: No gross deformities, no edema.  Skin: Intact, warm, no rashes.  LABS:  BMET  Recent Labs Lab 03/17/2016 1727 03/14/2016 1740  NA 129* 127*  K 5.1 4.8  CL 95* 98*  CO2 19*  --   BUN 45* 44*  CREATININE 1.03* 0.80  GLUCOSE 111* 109*    Electrolytes  Recent Labs Lab 03/17/2016 1727  CALCIUM 10.0    CBC  Recent Labs Lab 03/07/2016 1727 03/07/2016 1740  WBC 24.4*  --   HGB 11.3* 11.9*  HCT 33.8* 35.0*  PLT 142*  --     Coag's  Recent Labs Lab 03/24/2016 1727  APTT 35  INR 1.31    Sepsis Markers  Recent Labs Lab 03/19/2016 1741  LATICACIDVEN 8.55*    ABG No results for input(s): PHART, PCO2ART, PO2ART in the last 168 hours.  Liver Enzymes  Recent Labs Lab 03/15/2016 1727  AST 45*  ALT 18  ALKPHOS 104  BILITOT 1.4*   ALBUMIN 2.4*    Cardiac Enzymes No results for input(s): TROPONINI, PROBNP in the last 168 hours.  Glucose No results for input(s): GLUCAP in the last 168 hours.  Imaging Dg Chest Port 1 View  03/25/2016  CLINICAL DATA:  Status post intubation EXAM: PORTABLE CHEST 1 VIEW COMPARISON:  03/06/2016 FINDINGS: Endotracheal tube with the tip 4 cm above the carina. Nasogastric tube with the tip coursing below the diaphragm, but the proximal port is in the distal esophagus; recommend advancing the nasogastric tube 7 cm. Bilateral chronic interstitial thickening. No pleural effusion or pneumothorax. Stable cardiomegaly. The osseous structures are unremarkable. IMPRESSION: 1. Endotracheal tube with the tip 4 cm above the carina. Electronically Signed   By: Kathreen Devoid   On: 03/11/2016 18:33   Dg Chest Portable 1 View  03/19/2016  CLINICAL DATA:  History pneumonia, GI bleed, CHF, diabetes mellitus, breast cancer  EXAM: PORTABLE CHEST 1 VIEW COMPARISON:  Portable exam 1740 hours compared 03/07/2016 FINDINGS: Enlargement of cardiac silhouette with pulmonary vascular congestion. Atherosclerotic calcification aorta. Large hiatal hernia. BILATERAL pulmonary infiltrates which could represent pulmonary edema or infection. No pleural effusion or pneumothorax. Diffuse osseous demineralization. IMPRESSION: Enlargement of cardiac silhouette with pulmonary vascular congestion and suspect pulmonary edema though infection not excluded. Large hiatal hernia. Electronically Signed   By: Lavonia Dana M.D.   On: 03/01/2016 17:52     STUDIES:  CXR 6/29 > pulmonary vascular congestion. CTA chest / abd / pelv 6/29 >  CULTURES: Blood 6/29 > Urine 6/29 >  ANTIBIOTICS: Vand 6/29 > Zosyn 6/29 >  SIGNIFICANT EVENTS: 6/29 > admitted with UGIB, intubated for airway protection.  LINES/TUBES: ETT 6/29 >  DISCUSSION: 82 y.o. F admitted 6/29 with hemorrhagic shock due to UGIB and required intubation in ED for airway  protection.  GI consulted by EDP.  ASSESSMENT / PLAN:  GASTROINTESTINAL A:   UGIB - unclear etiology at this point. GERD. Nutrition. P:   GI consulted by EDP, appreciate the assistance. Continue PPI gtt. NPO.  HEMATOLOGIC / ONCOLOGIC A:   Hemorrhagic shock - due to UGIB. VTE Prophylaxis. Hx breast CA. P:  H/H q6hrs. Transfuse for Hgb < 7. SCD's. CBC in AM.  CARDIOVASCULAR A:  Hemorrhagic shock - due to UGIB. Hx CHF (Echo from 03/17/16 with EF 60-65%, severe aortic stenosis), dysrhythmia, aortic stenosis. P:  Continue IVF resuscitation. May need additional blood products. May require CVL. Stress steroids (on chronic prednisone for polymyalgia). Hold outpatient ASA. Trend troponin, lactate.  INFECTIOUS A:   Concern for mesenteric ischemia. P:   Abx as above (vanc / zosyn).  F/u LDH. Follow cultures as above.  PULMONARY A: Acute hypoxic respiratory failure with inability to protect airway - s/p intubation 6/29. P:   Full vent support. Wean as able. VAP prevention measures. SBT in AM if able. CXR in AM.  RENAL A:   Hyponatremia. AKI. Hypocalcemia. AGMA - lactate. P:   NS @ 100. 1g Ca gluconate. BMP in AM.  ENDOCRINE A:   DM. Hypothyroidism. P:   SSI. Continue outpatient synthroid, change to IV formulation. Assess TSH.  NEUROLOGIC A:   Acute encephalopathy. P:   Sedation:  Fentanyl gtt / Midazolam PRN. RASS goal: 0 to -1. Daily WUA.  IMMUNOLOGIC A: Polymalgia - on chronic prednisone. P: Stress steroids.  Family updated: None available, will check back tonight.  Interdisciplinary Family Meeting v Palliative Care Meeting:  Due by: 07/05.  CC time: 40 minutes.   Montey Hora, [place] [facility] Pager: [phone]  or [phone] 03/25/2016, 7:42 PM  Attending Note:  82 year old female with history of breast cancer and CHF due to aortic stenosis who presents to [facility] with hematemesis and  melena.  Patient dropped her BP and became encephalopathic then intubated for airway protection.  PCCM was called to admit.  Patient is currently sedated and on pressors.  Levophed or 20.  On exam, unresponsive, some coffee grounds in the OGT.  Per family, patient has been very weak and has been told that she is not strong enough to undergo any diagnostics or treatment for some lung nodules and has been made DNR in the past but was intubated incase there is something acute that can be handled.  EDP called GI and they will come to see patient in the morning.  In the meantime,  transfuse aggressively and IVF resuscitation, attempt to get off pressors.  I spoke with the family at length, the patient has been clear in the past that she did not want aggressive care.  After discussion, will not escalate care any further at this point, once GI sees, if there is something that can be addressed quickly then address and get patient off life support then these are her desires.  If can not be liberated from life support quickly then progress to comfort care.  Family was notified of diffuse LAN and concern for cancer, they do not wish for any aggressive interventions at this time.  GI to see in AM, will continue aggressive volume resuscitation and labs then will readdress plan of care in AM.  The patient is critically ill with multiple organ systems failure and requires high complexity decision making for assessment and support, frequent evaluation and titration of therapies, application of advanced monitoring technologies and extensive interpretation of multiple databases.   Critical Care Time devoted to patient care services described in this note is  35  Minutes. This time reflects time of care of this signee Dr Jennet Maduro. This critical care time does not reflect procedure time, or teaching time or supervisory time of PA/NP/Med student/Med Resident etc but could involve care discussion time.  Rush Farmer,  M.D. [facility] Pulmonary/Critical Care Medicine. Pager: [phone]. After hours pager: [phone].

## 2016-03-26 NOTE — Progress Notes (Signed)
Patient transported from ED to [id number] without complications.  Receiving RT aware.

## 2016-03-26 NOTE — ED Notes (Signed)
Intensivist at bedside.

## 2016-03-26 NOTE — ED Notes (Signed)
Verbal order for another 59mL saline bolus

## 2016-03-26 NOTE — ED Notes (Signed)
Family at bedside, EDP aware.  

## 2016-03-26 NOTE — ED Notes (Signed)
Pt's CBG result was 113. Informed - Tori - RN.

## 2016-03-26 NOTE — Progress Notes (Signed)
RT NOTE:  Pt transported to 2M10 by Jenny Reichmann Day, RT from ED. Report given to [place], RT.

## 2016-03-26 NOTE — ED Notes (Signed)
Paged [facility] to [phone]

## 2016-03-26 NOTE — ED Provider Notes (Signed)
CSN: [id number]     Arrival date & time 03/08/2016  1723 History   First MD Initiated Contact with Patient 03/25/2016 1729     No chief complaint on file.    (Consider location/radiation/quality/duration/timing/severity/associated sxs/prior Treatment) HPI Comments: LEVEL 5 CAVEAT FOR UNSTABLE PATIENT  82 y.o. female with history of interstitial lung disease and chronic respiratory failure on nocturnal oxygen, polymyalgia on prednisone, chronic diastolic heart failure, diabetes, possible metastatic lung nodules comes in with cc of AMS and bloody stools.  Per EMS, pt had bloody emesis en route with BRB and started having black tarry stools.  We were able to get breif hx from the daughters, who report that pt had received medicine for constipation, and had a large BM. She started getting unwell on the comod. She had dark brown stools. There was no tarry stools or emesis at home.  Pt in the ER with tachycardia in the 140s, fevers, SBP of 110, RR in the 30s. She is moaning. Has a gag, but not responding meaningfully to any stimuli or following command.   The history is provided by the EMS personnel, a relative and medical records.    Past Medical History  Diagnosis Date  . Arthritis   . Polymyalgia ([place])   . Acid reflux   . Immune deficiency disorder (H[facility])     autoimmune disorder - polymyalgia - on prednisone  . Breast cancer ([place])     Treated with lumpectomy and radiation  . Giardia   . Pinworms   . History of hiatal hernia   . Pneumonia   . Dementia   . CHF (congestive heart failure) ([place])   . Dysrhythmia   . Diabetes mellitus without complication ([place])   . Aortic stenosis     a. Echo 6/17:  Mild LVH, EF 60-65%, no RWMA, Gr 2 DD, severe AS, mean AV gradient 76 mmHg, severe MAC, mild to mod MS, mean MV 6 mmHg/peak 14 mmHg, mod LAE, normal RVSF, PASP 39 mmHg   Past Surgical History  Procedure Laterality Date  . Breast lumpectomy    . Partial hysterectomy    . Tonsillectomy    .  Tee without cardioversion N/A 07/20/2014    Procedure: TRANSESOPHAGEAL ECHOCARDIOGRAM (TEE);  Surgeon: Candee Furbish, MD;  Location: [facility] ENDOSCOPY;  Service: Cardiovascular;  Laterality: N/A;  . Abdominal hysterectomy     Family History  Problem Relation Age of Onset  . Stroke Mother    Social History  Substance Use Topics  . Smoking status: Never Smoker   . Smokeless tobacco: Never Used  . Alcohol Use: No   OB History    No data available     Review of Systems    Allergies  Adhesive; Doxycycline; Other; Penicillins; Shellfish allergy; Sulfa antibiotics; Sulfamethoxazole; Meperidine; and Methotrexate  Home Medications   Prior to Admission medications   Medication Sig Start Date End Date Taking? Authorizing Provider  acetaminophen (TYLENOL) 80 MG chewable tablet Chew 80 mg by mouth every 6 (six) hours as needed for fever.   Yes Historical Provider, MD  aspirin 81 MG tablet Take 81 mg by mouth daily.   Yes Historical Provider, MD  BIOTIN PO Take 1 tablet by mouth daily.   Yes Historical Provider, MD  Cholecalciferol (VITAMIN D3) 2000 units capsule Take 2,000 Units by mouth daily.   Yes Historical Provider, MD  Cyanocobalamin (VITAMIN B-12 PO) Take 1 tablet by mouth daily.   Yes Historical Provider, MD  folic acid (FOLVITE) 1 MG tablet  Take 1 mg by mouth daily.   Yes Historical Provider, MD  hydroxypropyl methylcellulose / hypromellose (ISOPTO TEARS / GONIOVISC) 2.5 % ophthalmic solution Place 1 drop into both eyes daily as needed for dry eyes.    Yes Historical Provider, MD  ibuprofen (ADVIL,MOTRIN) 100 MG chewable tablet Chew 100 mg by mouth every 8 (eight) hours as needed for fever.   Yes Historical Provider, MD  levothyroxine (SYNTHROID, LEVOTHROID) 25 MCG tablet Take 25 mcg by mouth daily before breakfast.   Yes Historical Provider, MD  OVER THE COUNTER MEDICATION Potassium Liquid: 15 ml's by mouth once daily if taking Lasix   Yes Historical Provider, MD  OVER THE COUNTER  MEDICATION Ferrous Sulfate 45 mg: Take 1 tablet by mouth once daily   Yes Historical Provider, MD  predniSONE (DELTASONE) 5 MG tablet Take 5 mg by mouth every other day. 02/03/16  Yes Historical Provider, MD  RABEprazole (ACIPHEX) 20 MG tablet Take 20 mg by mouth every other day. 03/06/16  Yes Historical Provider, MD  saccharomyces boulardii (FLORASTOR) 250 MG capsule Take 250 mg by mouth every morning.    Yes Historical Provider, MD  triamcinolone cream (KENALOG) 0.1 % Apply 1 application topically daily as needed (for itching).  01/30/16  Yes Historical Provider, MD  cefUROXime (CEFTIN) 500 MG tablet Take 1 tablet (500 mg total) by mouth 2 (two) times daily with a meal. Patient not taking: Reported on 03/17/2016 03/09/16   Cherene Altes, MD  nystatin (MYCOSTATIN) 100000 UNIT/ML suspension Take 5 mLs (500,000 Units total) by mouth 4 (four) times daily. Patient not taking: Reported on 03/16/2016 03/09/16   Cherene Altes, MD   BP 105/43 mmHg  Pulse 83  Temp(Src) 99.1 F (37.3 C) (Oral)  Resp 23  Ht 5\' 3"  (1.6 m)  Wt 130 lb (58.968 kg)  BMI 23.03 kg/m2  SpO2 100% Physical Exam  Constitutional: She is oriented to person, place, and time. She appears well-developed.  HENT:  Head: Normocephalic and atraumatic.  Eyes: Conjunctivae and EOM are normal.  Neck: Normal range of motion. Neck supple. No JVD present.  Cardiovascular: Normal rate.   Pulmonary/Chest: Effort normal.  Abdominal: Soft. Bowel sounds are normal. She exhibits distension. There is no tenderness.  Melenotic stools  Neurological: She is oriented to person, place, and time.  Moaning, not following any commands, poor gag reflex  Skin: Skin is warm and dry.  Nursing note and vitals reviewed.   ED Course  .Critical Care Performed by: Varney Biles Authorized by: Varney Biles Total critical care time: 150 minutes Critical care time was exclusive of separately billable procedures and treating other patients. Critical  care was necessary to treat or prevent imminent or life-threatening deterioration of the following conditions: circulatory failure, shock and sepsis. Critical care was time spent personally by me on the following activities: blood draw for specimens, development of treatment plan with patient or surrogate, discussions with consultants, interpretation of cardiac output measurements, evaluation of patient's response to treatment, examination of patient, obtaining history from patient or surrogate, ordering and review of laboratory studies, ordering and review of radiographic studies, pulse oximetry, re-evaluation of patient's condition, review of old charts and ordering and performing treatments and interventions. Subsequent provider of critical care: I assumed direction of critical care for this patient from another provider of my specialty.   (including critical care time)  @7 :00 Spoke with Dr. Claudina Lick, GI. He is aware of the initial labs including CBC, INR and the presenting symptoms along with  the persistent coffee ground emesis and hx of chronic prednisone use. For now he recommends serial CBC and expectant care and they will see the patient tomorrow, unless pt starts having massive GI bleed. Spoke with Dr. Titus Mould - he will have his team assess the patient. LDH ordered per his request. Oral contrast given per OG per his request.  Pt is s/p broad spectrum antibiotics. PT needed emergency blood release as soon as she arrived. Phlebotomy per RN will not draw blood due to emergency blood hanging, thus she started antibitocs. Per lab policy no cultures to be sent for 2 hour after transfusion completed. Advised her to get cultures now, and place the explanation.  Spoke with family and update provided. Pt is DNR/DNI.   @9 :45 pm Sepsis - Repeat Assessment  Performed at:    9:45 pm  Vitals     Blood pressure 105/43, pulse 83, temperature 100.9 F (38.3 C), temperature source Axillary, resp. rate  23, height 5\' 3"  (1.6 m), weight 130 lb (58.968 kg), SpO2 99 %.  Heart:     Regular rate and rhythm - 90s  Lungs:    Rales  Capillary Refill:   <2 sec  Peripheral Pulse:   Radial pulse palpable  Skin:     Pale    Labs Review Labs Reviewed  CBC WITH DIFFERENTIAL/PLATELET - Abnormal; Notable for the following:    WBC 24.4 (*)    RBC 3.70 (*)    Hemoglobin 11.3 (*)    HCT 33.8 (*)    RDW 17.8 (*)    Platelets 142 (*)    Neutro Abs 12.7 (*)    Lymphs Abs 8.3 (*)    Eosinophils Absolute 1.5 (*)    Basophils Absolute 0.2 (*)    All other components within normal limits  COMPREHENSIVE METABOLIC PANEL - Abnormal; Notable for the following:    Sodium 129 (*)    Chloride 95 (*)    CO2 19 (*)    Glucose, Bld 111 (*)    BUN 45 (*)    Creatinine, Ser 1.03 (*)    Albumin 2.4 (*)    AST 45 (*)    Total Bilirubin 1.4 (*)    GFR calc non Af Amer 47 (*)    GFR calc Af Amer 55 (*)    All other components within normal limits  PROTIME-INR - Abnormal; Notable for the following:    Prothrombin Time 16.4 (*)    All other components within normal limits  LACTATE DEHYDROGENASE - Abnormal; Notable for the following:    LDH 411 (*)    All other components within normal limits  TROPONIN I - Abnormal; Notable for the following:    Troponin I 0.17 (*)    All other components within normal limits  LACTIC ACID, PLASMA - Abnormal; Notable for the following:    Lactic Acid, Venous 3.8 (*)    All other components within normal limits  CBC - Abnormal; Notable for the following:    WBC 22.2 (*)    RBC 3.73 (*)    Hemoglobin 11.2 (*)    HCT 33.4 (*)    RDW 16.6 (*)    Platelets 116 (*)    All other components within normal limits  POC OCCULT BLOOD, ED - Abnormal; Notable for the following:    Fecal Occult Bld POSITIVE (*)    All other components within normal limits  I-STAT CG4 LACTIC ACID, ED - Abnormal; Notable for the following:  Lactic Acid, Venous 8.55 (*)    All other components  within normal limits  I-STAT CHEM 8, ED - Abnormal; Notable for the following:    Sodium 127 (*)    Chloride 98 (*)    BUN 44 (*)    Glucose, Bld 109 (*)    Calcium, Ion 1.11 (*)    Hemoglobin 11.9 (*)    HCT 35.0 (*)    All other components within normal limits  I-STAT ARTERIAL BLOOD GAS, ED - Abnormal; Notable for the following:    pH, Arterial 7.341 (*)    pO2, Arterial 137.0 (*)    Acid-base deficit 4.0 (*)    All other components within normal limits  CBG MONITORING, ED - Abnormal; Notable for the following:    Glucose-Capillary 113 (*)    All other components within normal limits  CULTURE, BLOOD (ROUTINE X 2)  CULTURE, BLOOD (ROUTINE X 2)  URINE CULTURE  MRSA PCR SCREENING  LIPASE, BLOOD  APTT  URINALYSIS, ROUTINE W REFLEX MICROSCOPIC (NOT AT [facility])  TRIGLYCERIDES  TSH  CBC  BASIC METABOLIC PANEL  BLOOD GAS, ARTERIAL  MAGNESIUM  PHOSPHORUS  TROPONIN I  LACTIC ACID, PLASMA  HEMOGLOBIN AND HEMATOCRIT, BLOOD  BLOOD GAS, ARTERIAL  TYPE AND SCREEN  PREPARE FRESH FROZEN PLASMA  PREPARE RBC (CROSSMATCH)  ABO/RH  PREPARE RBC (CROSSMATCH)    Imaging Review Dg Chest Port 1 View  03/09/2016  CLINICAL DATA:  Status post intubation EXAM: PORTABLE CHEST 1 VIEW COMPARISON:  03/06/2016 FINDINGS: Endotracheal tube with the tip 4 cm above the carina. Nasogastric tube with the tip coursing below the diaphragm, but the proximal port is in the distal esophagus; recommend advancing the nasogastric tube 7 cm. Bilateral chronic interstitial thickening. No pleural effusion or pneumothorax. Stable cardiomegaly. The osseous structures are unremarkable. IMPRESSION: 1. Endotracheal tube with the tip 4 cm above the carina. Electronically Signed   By: Kathreen Devoid   On: 03/13/2016 18:33   Dg Chest Portable 1 View  03/09/2016  CLINICAL DATA:  History pneumonia, GI bleed, CHF, diabetes mellitus, breast cancer EXAM: PORTABLE CHEST 1 VIEW COMPARISON:  Portable exam 1740 hours compared 03/07/2016  FINDINGS: Enlargement of cardiac silhouette with pulmonary vascular congestion. Atherosclerotic calcification aorta. Large hiatal hernia. BILATERAL pulmonary infiltrates which could represent pulmonary edema or infection. No pleural effusion or pneumothorax. Diffuse osseous demineralization. IMPRESSION: Enlargement of cardiac silhouette with pulmonary vascular congestion and suspect pulmonary edema though infection not excluded. Large hiatal hernia. Electronically Signed   By: Lavonia Dana M.D.   On: 02/28/2016 17:52   Ct Angio Chest/abd/pel For Dissection W And/or W/wo  02/29/2016  CLINICAL DATA:  Noted interstitial lung disease. Bloody emesis and black tori stools. Hypotensive. EXAM: CT VENOGRAM CHEST-ABD-PELV FOR DISSECTION W/ AND WO/W CM TECHNIQUE: Pre and postcontrast images were obtained of the chest with MIP imaging. Arterial phase imaging was obtained through the abdomen and pelvis. CONTRAST:  100 cc of Isovue 370 COMPARISON:  CT of the chest March 06, 2016 and CT of the abdomen and pelvis October 16, 2014 FINDINGS: CT of the chest: There are small to moderate bilateral pleural effusions which are new in the interval with associated compressive atelectasis. Findings of interstitial lung disease are again identified, better described on the CT of the chest from March 06, 2016. The nodule in the right lung on axial image 54 measures 19 by 14 mm today, unchanged in the short interval. The other nodularity seen previously is difficult to assess due to  respiratory motion and atelectasis in the bases. The patient has been intubated. The ETT terminates 15 mm above the carina. Airways are otherwise unremarkable. Adenopathy remains in the chest including in the bilateral axillary regions, subpectoral regions, mediastinum, and hila. There is a moderate hiatal hernia. An NG tube terminates in the stomach below the diaphragm. The central great vessels demonstrate no aneurysm. Coronary artery calcifications are noted. The  heart size is unchanged and the heart is stable in appearance. Increased attenuation in the subcutaneous fat consistent with edema. Atherosclerotic changes seen in the thoracic aorta. No aneurysm or dissection. No central pulmonary emboli identified. CT of the abdomen and pelvis: No free air. There is ascites identified, most marked in in the right upper abdomen adjacent to the liver, duodenum, and gallbladder. The attenuation is less than 10, consistent with simple fluid. No abscess. The free fluid also extends into the pelvis. No aneurysm in the abdominal aorta. No dissection. Atherosclerotic change seen in the aorta and its branching vessels. There is mild resulting narrowing of the proximal SMA which is probably not acute. There is a low-attenuation lesion in the left hepatic lobe a on axial image 129 measuring 8 mm, not seen previously. A low-attenuation lesion in the right hepatic lobe on image 116 appears be will larger in the interval but is better seen at least partially due to presence of contrast. Another lesion on axial image 105 is probably not changed. The gallbladder is not well assessed due to surrounding ascites. However, cholelithiasis is again identified. The spleen measures up to 10.4 cm which is within normal limits by size but more prominent in the interval. There is adenopathy in the retroperitoneum with was not present in January of 2016 and was not imaged on the comparison CT scan. This extends into the pelvis and inguinal regions. No adrenal abnormalities are identified. The pancreas is unremarkable. Tiny cysts are seen in the kidneys with no suspicious masses or obstruction. An IVC filter is identified. The colon is not well assessed due the lack of oral contrast. No definitive colonic abnormalities are seen. The appendix is not visualized but there is no secondary evidence of appendicitis. The distal small bowel is well opacified with contrast and normal in appearance. The duodenum and  proximal jejunum is not well assessed but no obvious acute abnormalities are noted. There is edema/ anasarca in the abdominal pelvic wall. The cystic mass in the right side of the pelvis measuring 5 cm is not significantly changed in the interval. The patient is status post hysterectomy. The bladder is decompressed with a Foley catheter. Adenopathy as described above. A representative node in the upper abdomen on axial image 154 measures 18 by 22 mm. A representative left inguinal node on axial image 270 measures 3.1 x 1.7 cm. Degenerative changes in the spine with no acute bony findings IMPRESSION: 1. No aortic aneurysm or dissection.  Atherosclerosis.  Sign a 2. New small to moderate bilateral pleural effusions with atelectasis. 3. At least 1 of the nodules seen previously in the lung remains. The other nodules are not as well assessed due to motion and atelectasis. Recommend follow-up after the patient's symptoms have resolved to determine the stability of these nodules. 4. Interstitial lung disease, unchanged. 5. Adenopathy in the chest, abdomen, and pelvis is concerning for metastatic disease given the history of breast cancer. 6. Moderate to large hiatal hernia. No definitive cause for bleeding in the bowel is identified. 7. There is ascites primarily in the right upper  quadrant pelvis. The source of ascites is unclear. Why the ascites appears to be centered on the right is also not clear. 8. Cholelithiasis. An ultrasound could better evaluate the gallbladder. 9. Stable right pelvic mass. 10. Subcutaneous edema/anasarca. Electronically Signed   By: Dorise Bullion III M.D   On: 03/25/2016 20:59   I have personally reviewed and evaluated these images and lab results as part of my medical decision-making.   EKG Interpretation   Date/Time:  Thursday March 26 2016 17:23:27 EDT Ventricular Rate:  135 PR Interval:    QRS Duration: 100 QT Interval:  288 QTC Calculation: 432 R Axis:   -46 Text  Interpretation:  Sinus tachycardia LAD, consider LAFB or inferior  infarct LVH with secondary repolarization abnormality Anterior infarct,  old Nonspecific ST and T wave abnormality - not new compared to before  Confirmed by Kathrynn Humble, MD, [facility] Comp [phone]) on 03/25/2016 6:06:29 PM      MDM   Final diagnoses:  Shock ([place])  Gastrointestinal hemorrhage with melena  Lactic acidosis  Respiratory distress    Pt comes in tachycardic, altered, with melena. She had hematemesis en route. Pt is confused, clammy. BP is normal at this time. PT is also noted in fever.  Clinical concerns are for patient to be in Septic shock vs. Hemorrhagic shock. Mesenteric ischemia is also on the ddx.  She has had recent admission for pneumonias. Family reports fevers since Monday, off and on with weakness. Will get broad spectrum antibiotics started now. Ct angio chest/abd/pelvis ordered - evaluate for PNA/Abd infection/Active bleeding  Pt is on chronic prednisone, concerns for perforated ulcer is high. Protonix drip started. No liver dz hx. Pt is not on anticoagulation.  Code status is DNR. Per family, they will de-escalate care if the prognosis doesn't improve.  Varney Biles, MD 03/27/16 [phone]

## 2016-03-26 NOTE — ED Notes (Signed)
Phlebotomist states that per [facility], unable to draw blood cultures until two hours post blood transfusion.

## 2016-03-26 NOTE — ED Notes (Signed)
Pt arrives EMS with c/ovomiting bright red blood and black tarry stool 30 minutes after recieving enema for 1 week constipation.Blood noted at mouth black tary liquid stool noted coming from front of diaper.Pt non verbal and moaning

## 2016-03-26 NOTE — ED Provider Notes (Signed)
.  Intubation Date/Time: 03/13/2016 5:58 PM Performed by: Karma Greaser Authorized by: Varney Biles Consent: The procedure was performed in an emergent situation. Risks and benefits: risks, benefits and alternatives were discussed Consent given by: power of attorney Patient identity confirmed: arm band Time out: Immediately prior to procedure a "time out" was called to verify the correct patient, procedure, equipment, support staff and site/side marked as required. Indications: airway protection Intubation method: direct Patient status: paralyzed (RSI) Preoxygenation: BVM Sedatives: etomidate Paralytic: succinylcholine Laryngoscope size: Mac 3 Tube size: 7.5 mm Tube type: cuffed Number of attempts: 1 Cricoid pressure: no Cords visualized: yes Post-procedure assessment: chest rise,  ETCO2 monitor and CO2 detector Breath sounds: equal Cuff inflated: yes ETT to lip: 22 cm ETT to teeth: 21 cm Tube secured with: ETT holder Chest x-ray interpreted by me. Chest x-ray findings: endotracheal tube in appropriate position Patient tolerance: Patient tolerated the procedure well with no immediate complications     Karma Greaser, MD 03/09/2016 1801  I was present for the direct supervision of the critical parts of the procedure. No complications.  Varney Biles, MD 03/02/2016 1824

## 2016-03-26 NOTE — Progress Notes (Signed)
RT NOTE:  Pt transported to CT and back without event.  

## 2016-03-26 NOTE — ED Notes (Signed)
Informed ER patient follows with Dr Watt Climes at [facility] and to page [facility] covering MD for consult.

## 2016-03-26 NOTE — ED Notes (Signed)
Family waiting in Consultation room approached RN in fast track regarding update; RN of pt contacted and advised that family needs an update on pt status

## 2016-03-27 ENCOUNTER — Inpatient Hospital Stay (HOSPITAL_COMMUNITY): Payer: Medicare Other

## 2016-03-27 DIAGNOSIS — R591 Generalized enlarged lymph nodes: Secondary | ICD-10-CM

## 2016-03-27 DIAGNOSIS — L8990 Pressure ulcer of unspecified site, unspecified stage: Secondary | ICD-10-CM | POA: Insufficient documentation

## 2016-03-27 DIAGNOSIS — N179 Acute kidney failure, unspecified: Secondary | ICD-10-CM

## 2016-03-27 DIAGNOSIS — E872 Acidosis: Secondary | ICD-10-CM

## 2016-03-27 LAB — TYPE AND SCREEN
ABO/RH(D): O NEG
Antibody Screen: NEGATIVE
UNIT DIVISION: 0
Unit division: 0

## 2016-03-27 LAB — GLUCOSE, CAPILLARY
GLUCOSE-CAPILLARY: 128 mg/dL — AB (ref 65–99)
GLUCOSE-CAPILLARY: 149 mg/dL — AB (ref 65–99)
GLUCOSE-CAPILLARY: 191 mg/dL — AB (ref 65–99)
GLUCOSE-CAPILLARY: 199 mg/dL — AB (ref 65–99)
Glucose-Capillary: 250 mg/dL — ABNORMAL HIGH (ref 65–99)
Glucose-Capillary: 180 mg/dL — ABNORMAL HIGH (ref 65–99)
Glucose-Capillary: 127 mg/dL — ABNORMAL HIGH (ref 65–99)

## 2016-03-27 LAB — CBC
HEMATOCRIT: 32.2 % — AB (ref 36.0–46.0)
Hemoglobin: 10.9 g/dL — ABNORMAL LOW (ref 12.0–15.0)
MCH: 30.4 pg (ref 26.0–34.0)
MCHC: 33.9 g/dL (ref 30.0–36.0)
MCV: 89.7 fL (ref 78.0–100.0)
PLATELETS: 147 10*3/uL — AB (ref 150–400)
RBC: 3.59 MIL/uL — AB (ref 3.87–5.11)
RDW: 17.5 % — ABNORMAL HIGH (ref 11.5–15.5)
WBC: 20.9 10*3/uL — AB (ref 4.0–10.5)

## 2016-03-27 LAB — BASIC METABOLIC PANEL
ANION GAP: 8 (ref 5–15)
BUN: 34 mg/dL — ABNORMAL HIGH (ref 6–20)
CALCIUM: 8.2 mg/dL — AB (ref 8.9–10.3)
CHLORIDE: 104 mmol/L (ref 101–111)
CO2: 21 mmol/L — AB (ref 22–32)
Creatinine, Ser: 0.68 mg/dL (ref 0.44–1.00)
GFR calc non Af Amer: >60 mL/min (ref >60)
GLUCOSE: 211 mg/dL — AB (ref 65–99)
Potassium: 5.1 mmol/L (ref 3.5–5.1)
Sodium: 133 mmol/L — ABNORMAL LOW (ref 135–145)

## 2016-03-27 LAB — POCT I-STAT 3, ART BLOOD GAS (G3+)
ACID-BASE DEFICIT: 3 mmol/L — AB (ref 0.0–2.0)
Bicarbonate: 22 mEq/L (ref 20.0–24.0)
O2 SAT: 99 %
PCO2 ART: 40.5 mmHg (ref 35.0–45.0)
PH ART: 7.345 — AB (ref 7.350–7.450)
PO2 ART: 157 mmHg — AB (ref 80.0–100.0)
Patient temperature: 99.4
TCO2: 23 mmol/L (ref 0–100)

## 2016-03-27 LAB — HEMOGLOBIN AND HEMATOCRIT, BLOOD
HEMATOCRIT: 31.1 % — AB (ref 36.0–46.0)
HEMATOCRIT: 35.7 % — AB (ref 36.0–46.0)
HEMOGLOBIN: 10.5 g/dL — AB (ref 12.0–15.0)
Hemoglobin: 12.1 g/dL (ref 12.0–15.0)

## 2016-03-27 LAB — LACTIC ACID, PLASMA
LACTIC ACID, VENOUS: 2.4 mmol/L — AB (ref 0.5–1.9)
Lactic Acid, Venous: 2.1 mmol/L (ref 0.5–1.9)

## 2016-03-27 LAB — MAGNESIUM: Magnesium: 2.2 mg/dL (ref 1.7–2.4)

## 2016-03-27 LAB — HEPATIC FUNCTION PANEL
ALT: 14 U/L (ref 14–54)
AST: 28 U/L (ref 15–41)
Albumin: 1.9 g/dL — ABNORMAL LOW (ref 3.5–5.0)
Alkaline Phosphatase: 73 U/L (ref 38–126)
BILIRUBIN DIRECT: 0.4 mg/dL (ref 0.1–0.5)
BILIRUBIN INDIRECT: 0.7 mg/dL (ref 0.3–0.9)
Total Bilirubin: 1.1 mg/dL (ref 0.3–1.2)
Total Protein: 5.4 g/dL — ABNORMAL LOW (ref 6.5–8.1)

## 2016-03-27 LAB — MRSA PCR SCREENING: MRSA by PCR: NEGATIVE

## 2016-03-27 LAB — AMYLASE: Amylase: 26 U/L — ABNORMAL LOW (ref 28–100)

## 2016-03-27 LAB — BLOOD PRODUCT ORDER (VERBAL) VERIFICATION

## 2016-03-27 LAB — LIPASE, BLOOD: LIPASE: 18 U/L (ref 11–51)

## 2016-03-27 LAB — PHOSPHORUS: PHOSPHORUS: 3.7 mg/dL (ref 2.5–4.6)

## 2016-03-27 LAB — POTASSIUM: POTASSIUM: 4.5 mmol/L (ref 3.5–5.1)

## 2016-03-27 LAB — PATHOLOGIST SMEAR REVIEW

## 2016-03-27 LAB — TROPONIN I: TROPONIN I: 0.21 ng/mL — AB (ref <0.03)

## 2016-03-27 MED ORDER — PIPERACILLIN-TAZOBACTAM 3.375 G IVPB
3.3750 g | Freq: Three times a day (TID) | INTRAVENOUS | Status: DC
  Administered 2016-03-27 – 2016-04-02 (×19): 3.375 g via INTRAVENOUS
  Filled 2016-03-27 (×22): qty 50

## 2016-03-27 MED ORDER — NOREPINEPHRINE BITARTRATE 1 MG/ML IV SOLN
0.0000 ug/min | INTRAVENOUS | Status: DC
  Administered 2016-03-27: 19 ug/min via INTRAVENOUS
  Administered 2016-03-27: 17 ug/min via INTRAVENOUS
  Administered 2016-03-27: 20 ug/min via INTRAVENOUS
  Filled 2016-03-27 (×4): qty 4

## 2016-03-27 MED ORDER — PHENYLEPHRINE HCL 10 MG/ML IJ SOLN
0.0000 ug/min | INTRAVENOUS | Status: DC
  Administered 2016-03-27: 30 ug/min via INTRAVENOUS
  Administered 2016-03-27 (×2): 55 ug/min via INTRAVENOUS
  Administered 2016-03-28: 40 ug/min via INTRAVENOUS
  Administered 2016-03-28: 80 ug/min via INTRAVENOUS
  Administered 2016-03-28 (×2): 50 ug/min via INTRAVENOUS
  Administered 2016-03-29: 10 ug/min via INTRAVENOUS
  Administered 2016-03-29: 50 ug/min via INTRAVENOUS
  Administered 2016-03-30 – 2016-03-31 (×2): 10 ug/min via INTRAVENOUS
  Administered 2016-04-01: 30 ug/min via INTRAVENOUS
  Administered 2016-04-01: 20 ug/min via INTRAVENOUS
  Administered 2016-04-01: 60 ug/min via INTRAVENOUS
  Filled 2016-03-27 (×22): qty 1

## 2016-03-27 MED ORDER — VANCOMYCIN HCL 500 MG IV SOLR
500.0000 mg | Freq: Two times a day (BID) | INTRAVENOUS | Status: DC
  Administered 2016-03-27 – 2016-03-29 (×4): 500 mg via INTRAVENOUS
  Filled 2016-03-27 (×5): qty 500

## 2016-03-27 MED ORDER — CHLORHEXIDINE GLUCONATE 0.12% ORAL RINSE (MEDLINE KIT)
15.0000 mL | Freq: Two times a day (BID) | OROMUCOSAL | Status: DC
  Administered 2016-03-27 – 2016-04-02 (×15): 15 mL via OROMUCOSAL

## 2016-03-27 MED ORDER — LEVOTHYROXINE SODIUM 100 MCG IV SOLR
12.5000 ug | Freq: Every day | INTRAVENOUS | Status: DC
Start: 2016-03-27 — End: 2016-04-02
  Administered 2016-03-27 – 2016-04-02 (×7): 12.5 ug via INTRAVENOUS
  Filled 2016-03-27 (×7): qty 5

## 2016-03-27 MED ORDER — ANTISEPTIC ORAL RINSE SOLUTION (CORINZ)
7.0000 mL | Freq: Four times a day (QID) | OROMUCOSAL | Status: DC
  Administered 2016-03-27 – 2016-04-02 (×25): 7 mL via OROMUCOSAL

## 2016-03-27 NOTE — Progress Notes (Signed)
PULMONARY / CRITICAL CARE MEDICINE   Name: Martha Mcbride MRN: [id number] DOB: 1925-05-24    ADMISSION DATE:  03/04/2016 CONSULTATION DATE:  03/18/2016  REFERRING MD:  Kathrynn Humble (EDP)  CHIEF COMPLAINT:  GI Bleed  HISTORY OF PRESENT ILLNESS:  Pt is encephelopathic; therefore, this HPI is obtained from chart review. Martha Mcbride is a 82 y.o. female with PMH as outlined below including ILD for which she is on nocturnal O2 and is followed by Dr. Elsworth Soho.  She presented to [facility] ED 06/29 with bloody emesis and black tarry stools.  Pt was reportedly constipated for several days prior so was given an enema.  She then had large BM consisting of melena followed by hematemesis.  In ED, she was minimally responsive therefore was intubated for airway protection.  She was given 2u PRBC empirically and 3L IVF.  Initial Hgb 11.3, Hct 33.8.  Post intubation, she was hypotensive; therefore, propofol was discontinued.  Dr. Paulita Fujita with [facility] was consulted by EDP.  Pt is on daily ASA '81mg'$ , no additional anti-platelets or anticoagulants.  She had recent admission 03/07/16 through 03/09/16 for pneumonia and was on oxygen and prednisone at home at time of admission.  SUBJECTIVE: No acute events since intubation. Remains on vasopressor support.  REVIEW OF SYSTEMS:  Unable to obtain as patient is intubated & sedated.  VITAL SIGNS: BP 110/52 mmHg  Pulse 59  Temp(Src) 98.3 F (36.8 C) (Oral)  Resp 21  Ht '5\' 2"'$  (1.575 m)  Wt 147 lb 14.9 oz (67.1 kg)  BMI 27.05 kg/m2  SpO2 100%  HEMODYNAMICS:    VENTILATOR SETTINGS: Vent Mode:  [-] PRVC FiO2 (%):  [40 %-60 %] 40 % Set Rate:  [18 bmp-20 bmp] 20 bmp Vt Set:  [420 mL] 420 mL PEEP:  [5 cmH20-8 cmH20] 8 cmH20 Plateau Pressure:  [18 cmH20-21 cmH20] 20 cmH20  INTAKE / OUTPUT: I/O last 3 completed shifts: In: 6318.4 [I.V.:5992.4; Blood:276; IV Piggyback:50] Out: 1955 [Urine:1705; Drains:250]   PHYSICAL EXAMINATION: General: Elderly chronically ill  appearing female, critically ill. Neuro: Sedated, non-responsive. HEENT: [place]/AT. PERRL, sclerae anicteric. Cardiovascular: RRR, 3/6 SEM radiating into carotids.  Lungs: Respirations even and unlabored.  Coarse bilaterally. Abdomen: BS x 4, soft, NT/ND.  Musculoskeletal: No gross deformities, no edema.  Skin: Intact, warm, no rashes.  LABS:  BMET  Recent Labs Lab 03/14/2016 1727 03/13/2016 1740 03/27/16 0742  NA 129* 127* 133*  K 5.1 4.8 5.1  CL 95* 98* 104  CO2 19*  --  21*  BUN 45* 44* 34*  CREATININE 1.03* 0.80 0.68  GLUCOSE 111* 109* 211*    Electrolytes  Recent Labs Lab 03/17/2016 1727 03/27/16 0742  CALCIUM 10.0 8.2*  MG  --  2.2  PHOS  --  3.7    CBC  Recent Labs Lab 03/17/2016 1727 03/09/2016 1740 03/13/2016 2215 03/27/16 0019  WBC 24.4*  --  22.2*  --   HGB 11.3* 11.9* 11.2* 12.1  HCT 33.8* 35.0* 33.4* 35.7*  PLT 142*  --  116*  --     Coag's  Recent Labs Lab 03/24/2016 1727  APTT 35  INR 1.31    Sepsis Markers  Recent Labs Lab 03/16/2016 1741 02/29/2016 2215 03/27/16 0019  LATICACIDVEN 8.55* 3.8* 2.4*    ABG  Recent Labs Lab 03/06/2016 2026 03/27/16 0326  PHART 7.341* 7.345*  PCO2ART 39.2 40.5  PO2ART 137.0* 157.0*    Liver Enzymes  Recent Labs Lab 03/21/2016 1727  AST 45*  ALT 18  ALKPHOS 104  BILITOT 1.4*  ALBUMIN 2.4*    Cardiac Enzymes  Recent Labs Lab 02/28/2016 2214 03/27/16 0019  TROPONINI 0.17* 0.21*    Glucose  Recent Labs Lab 03/02/2016 2208 02/29/2016 2359 03/27/16 0330 03/27/16 0746  GLUCAP 113* 199* 191* 250*    Imaging Dg Chest Port 1 View  03/27/2016  CLINICAL DATA:  Respiratory failure. EXAM: PORTABLE CHEST 1 VIEW COMPARISON:  03/17/2016. FINDINGS: Endotracheal tube tip 1.4 cm above the carina, proximal repositioning of approximately 2 cm should be considered. NG tube noted with tip projected over stomach. The side hole is just below the gastroesophageal junction, advancement of approximately 6 cm should  be considered. Stable cardiomegaly. Progressive bilateral pulmonary infiltrates noted suggesting pulmonary edema. Bilateral pneumonia cannot be excluded. Small bilateral pleural effusions. No pneumothorax. IMPRESSION: 1. Endotracheal tube tip 1.4 cm above the carina, proximal repositioning approximately 2 cm should be considered. NG tube noted with tip projected over the stomach. The side hole is just below the gastroesophageal junction, advancement of approximately 6 cm should be considered . 2. Stable cardiomegaly. Progressive bilateral pulmonary infiltrates noted suggesting pulmonary edema. Bilateral pneumonia cannot be excluded. Small bilateral pleural effusions. Electronically Signed   By: Maisie Fus  Register   On: 03/27/2016 07:14   Dg Chest Port 1 View  03/11/2016  CLINICAL DATA:  Status post intubation EXAM: PORTABLE CHEST 1 VIEW COMPARISON:  03/06/2016 FINDINGS: Endotracheal tube with the tip 4 cm above the carina. Nasogastric tube with the tip coursing below the diaphragm, but the proximal port is in the distal esophagus; recommend advancing the nasogastric tube 7 cm. Bilateral chronic interstitial thickening. No pleural effusion or pneumothorax. Stable cardiomegaly. The osseous structures are unremarkable. IMPRESSION: 1. Endotracheal tube with the tip 4 cm above the carina. Electronically Signed   By: Elige Ko   On: 03/03/2016 18:33   Dg Chest Portable 1 View  03/05/2016  CLINICAL DATA:  History pneumonia, GI bleed, CHF, diabetes mellitus, breast cancer EXAM: PORTABLE CHEST 1 VIEW COMPARISON:  Portable exam 1740 hours compared 03/07/2016 FINDINGS: Enlargement of cardiac silhouette with pulmonary vascular congestion. Atherosclerotic calcification aorta. Large hiatal hernia. BILATERAL pulmonary infiltrates which could represent pulmonary edema or infection. No pleural effusion or pneumothorax. Diffuse osseous demineralization. IMPRESSION: Enlargement of cardiac silhouette with pulmonary vascular  congestion and suspect pulmonary edema though infection not excluded. Large hiatal hernia. Electronically Signed   By: Ulyses Southward M.D.   On: 03/07/2016 17:52   Ct Angio Chest/abd/pel For Dissection W And/or W/wo  03/06/2016  CLINICAL DATA:  Noted interstitial lung disease. Bloody emesis and black tori stools. Hypotensive. EXAM: CT VENOGRAM CHEST-ABD-PELV FOR DISSECTION W/ AND WO/W CM TECHNIQUE: Pre and postcontrast images were obtained of the chest with MIP imaging. Arterial phase imaging was obtained through the abdomen and pelvis. CONTRAST:  100 cc of Isovue 370 COMPARISON:  CT of the chest March 06, 2016 and CT of the abdomen and pelvis October 16, 2014 FINDINGS: CT of the chest: There are small to moderate bilateral pleural effusions which are new in the interval with associated compressive atelectasis. Findings of interstitial lung disease are again identified, better described on the CT of the chest from March 06, 2016. The nodule in the right lung on axial image 54 measures 19 by 14 mm today, unchanged in the short interval. The other nodularity seen previously is difficult to assess due to respiratory motion and atelectasis in the bases. The patient has been intubated. The ETT terminates 15 mm above the carina.  Airways are otherwise unremarkable. Adenopathy remains in the chest including in the bilateral axillary regions, subpectoral regions, mediastinum, and hila. There is a moderate hiatal hernia. An NG tube terminates in the stomach below the diaphragm. The central great vessels demonstrate no aneurysm. Coronary artery calcifications are noted. The heart size is unchanged and the heart is stable in appearance. Increased attenuation in the subcutaneous fat consistent with edema. Atherosclerotic changes seen in the thoracic aorta. No aneurysm or dissection. No central pulmonary emboli identified. CT of the abdomen and pelvis: No free air. There is ascites identified, most marked in in the right upper  abdomen adjacent to the liver, duodenum, and gallbladder. The attenuation is less than 10, consistent with simple fluid. No abscess. The free fluid also extends into the pelvis. No aneurysm in the abdominal aorta. No dissection. Atherosclerotic change seen in the aorta and its branching vessels. There is mild resulting narrowing of the proximal SMA which is probably not acute. There is a low-attenuation lesion in the left hepatic lobe a on axial image 129 measuring 8 mm, not seen previously. A low-attenuation lesion in the right hepatic lobe on image 116 appears be will larger in the interval but is better seen at least partially due to presence of contrast. Another lesion on axial image 105 is probably not changed. The gallbladder is not well assessed due to surrounding ascites. However, cholelithiasis is again identified. The spleen measures up to 10.4 cm which is within normal limits by size but more prominent in the interval. There is adenopathy in the retroperitoneum with was not present in January of 2016 and was not imaged on the comparison CT scan. This extends into the pelvis and inguinal regions. No adrenal abnormalities are identified. The pancreas is unremarkable. Tiny cysts are seen in the kidneys with no suspicious masses or obstruction. An IVC filter is identified. The colon is not well assessed due the lack of oral contrast. No definitive colonic abnormalities are seen. The appendix is not visualized but there is no secondary evidence of appendicitis. The distal small bowel is well opacified with contrast and normal in appearance. The duodenum and proximal jejunum is not well assessed but no obvious acute abnormalities are noted. There is edema/ anasarca in the abdominal pelvic wall. The cystic mass in the right side of the pelvis measuring 5 cm is not significantly changed in the interval. The patient is status post hysterectomy. The bladder is decompressed with a Foley catheter. Adenopathy as  described above. A representative node in the upper abdomen on axial image 154 measures 18 by 22 mm. A representative left inguinal node on axial image 270 measures 3.1 x 1.7 cm. Degenerative changes in the spine with no acute bony findings IMPRESSION: 1. No aortic aneurysm or dissection.  Atherosclerosis.  Sign a 2. New small to moderate bilateral pleural effusions with atelectasis. 3. At least 1 of the nodules seen previously in the lung remains. The other nodules are not as well assessed due to motion and atelectasis. Recommend follow-up after the patient's symptoms have resolved to determine the stability of these nodules. 4. Interstitial lung disease, unchanged. 5. Adenopathy in the chest, abdomen, and pelvis is concerning for metastatic disease given the history of breast cancer. 6. Moderate to large hiatal hernia. No definitive cause for bleeding in the bowel is identified. 7. There is ascites primarily in the right upper quadrant pelvis. The source of ascites is unclear. Why the ascites appears to be centered on the right is also  not clear. 8. Cholelithiasis. An ultrasound could better evaluate the gallbladder. 9. Stable right pelvic mass. 10. Subcutaneous edema/anasarca. Electronically Signed   By: Dorise Bullion III M.D   On: 03/01/2016 20:59     STUDIES:  CXR 6/29 > pulmonary vascular congestion. CTA chest / abd / pelv 6/29 > pleural effusions with atelectasis, stable ILD, adenopathy of the chest abdomen and pelvis concerning for metastasis, large hiatal hernia, ascites in RUQ, cholelithiasis CXR 6/30 >> stable cardiomegaly ; progressive bilateral pulmonary infiltrates/ pulmonary edema; small bilateral pleural effusions.  MICROBIOLOGY: MRSA PCR 6/29:  Negative Blood Ctx x2 6/29 > Urine Ctx 6/29 > Trach Asp Ctx 6/30 >  ANTIBIOTICS: Vancomycin 6/29 > Zosyn 6/29 >  SIGNIFICANT EVENTS: 6/29 - admitted with UGIB, intubated for airway protection.  LINES/TUBES: OETT 7.5 6/29 >> OGT 6/29  >> FOLEY 6/29 >> PIV x 3  6/29 >> PEG >>  DISCUSSION: 82 y.o. F admitted 6/29 with hemorrhagic shock due to UGIB and required intubation in ED for airway protection.  GI consulted by EDP . Note 6/30 indicates suspected Upper GI Bleed/ Mallory Weiss tear.with plans for EGD once patient has stabilized.She is a DNR with suspected breast cancer metastasis and previously stated she does not want aggressive care.She is currently unresponsive on the ventilator. CXR >> 6/30  indicates bilateral infiltrates vs pulmonary edema, pressor dependent. Will need to have discussion with family regarding realistic goals of care versus continued escalation of care.If she cannot be liberated from life support, progress to comfort care.  ASSESSMENT / PLAN:  GASTROINTESTINAL A:   UGIB  H/O GERD Cholelithiasis - On CT imaging.  P:   GI following , appreciate the assistance. Continue PPI gtt per GI. EGD when stable per GI NPO for now. Lipase, Amylase, Bilirubin, Alk Phos, LDH @ 1700 RUQ U/S  HEMATOLOGIC / ONCOLOGIC A:   Anemia - Secondary to UGIB. Leukocytosis - Improving. Likely stress response vs sepsis. Adenopathy - Seen on CT. H/O Breast Cancer  P:  Trend cell counts daily w/ CBC Transfuse for Hgb < 7. Repeat Hgb/Hct at 1700 SCD's Checking serum LDH  CARDIOVASCULAR A:  Shock - Hemorrhagic due to UGIB. H/O CHF (Echo from 03/17/16 with EF 60-65%, severe aortic stenosis) H/O Severe Aortic Stenosis  P:  Monitor on telemetry Vitals per unit protocol D/C Levophed Start Neo-Synephrine to maintain MAP >65 & SBP >90 Trending Troponin I q6hr Checking TTE  INFECTIOUS A:   Sepsis - Possible intra-abdominal source.  P:   Abx as above (vanc / zosyn).  Follow cultures as above. Re-culture for Temp >101.5  PULMONARY A: Acute Hypoxic Respiratory Failure - Unable to protect airway. Pulmonary Edema vs HCAP  P:   Full vent support. Wean as able. VAP prevention measures. SBT  When  able. CXR  7/1 Diurese when hemodynamically stable.  RENAL A:   Acute Renal Failure Hyponatremia Hypocalcemia NAGMA Lactic Acidosis  P:   Trending UOP with Foley Monitoring electrolytes & renal function daily Repeat electrolytes at 1700 NS @ 100. Replacing electrolytes as indicated  ENDOCRINE A:   H/O DM H/O Hypothyroidism. Chronic Prednisone Therapy  P:   Accu-Checks q4hr SSI per Low Dose Algorithm Synthroid IV Hydrocortisone IV q6hr  NEUROLOGIC A:   Acute Encephalopathy Sedation on Ventilator  P:   RASS goal: 0 to -1. Fentanyl gtt & IV prn Versed IV prn Daily WUA.  IMMUNOLOGIC A: Polymalgia - on chronic prednisone.  P: Hydrocortisone IV q6hr  Family updated: Family at bedside and  updated.  Interdisciplinary Family Meeting v Palliative Care Meeting:  Due by: 07/05.   Magdalen Spatz, AGACNP-BC [place] Pager [phone] 03/27/2016  PCCM Attending Note: Patient seen and examined with nurse practitioner. Please refer to her progress note which I reviewed in detail. Patient remains critically oh on low-dose vasopressor support. Switching to peripheral Neo-Synephrine. Checking serum LDH as well as additional labs and right upper quadrant ultrasound with potential for cholecystitis as the cause for patient's decompensation. Appreciate GI evaluation and recommendations at this time. Ongoing family discussions once we have a better understanding of the etiology of the patient's acute decompensation.   I have spent a total of 32 minutes of critical care time today caring for the patient and reviewing the patient's electronic medical record.  Sonia Baller Ashok Cordia, M.D. [facility] Pulmonary & Critical Care Pager:  [phone] After 3pm or if no response, call [phone] 2:20 PM 03/27/2016

## 2016-03-27 NOTE — Progress Notes (Signed)
Sputum sample obtained and sent down to main lab without complications.  

## 2016-03-27 NOTE — Progress Notes (Signed)
   03/27/16 1000  Clinical Encounter Type  Visited With Patient;Patient and family together  Visit Type Follow-up;Spiritual support  Spiritual Encounters  Spiritual Needs Emotional  Stress Factors  Patient Stress Factors Exhausted;Health changes;Loss of control  Family Stress Factors Exhausted;Family relationships;Health changes;Loss of control   Chaplain visited with patient while doing rounds on the floor. Chaplain following up from patient being admitted from the ED.  Chaplain offered prayer to patient and family.  Family shared religious faith of patient with Chaplain. Patient is a Financial controller.  Vilinda Blanks Georgeana Oertel 03/27/2016 10:26 AM

## 2016-03-27 NOTE — Progress Notes (Signed)
Initial Nutrition Assessment  DOCUMENTATION CODES:   Not applicable  INTERVENTION:    If patient remains intubated, recommend initiate TF via OGT when GI bleed resolves. Vital AF 1.2 at 55 would provide 1320 ml, 1584 kcals, 99 gm protein, 1071 ml free water daily.  When patient is extubated, recommend swallow evaluation with SLP prior to advancing PO diet, due to hx of severe dysphagia.   NUTRITION DIAGNOSIS:   Inadequate oral intake related to inability to eat as evidenced by NPO status.  GOAL:   Patient will meet greater than or equal to 90% of their needs  MONITOR:   Vent status, Labs, Weight trends, I & O's, Skin  REASON FOR ASSESSMENT:   Ventilator    ASSESSMENT:   82 y.o. female with PMH as outlined below including ILD for which she is on nocturnal O2 and is followed by Dr. Elsworth Soho. She presented to [facility] ED 06/29 with bloody emesis and black tarry stools.  Medications and labs reviewed: sodium low Discussed patient in ICU rounds and with RN today. No plans to start enteral nutrition at this time. Patient with hx of severe dysphagia per RN. Hx of PEG, but it is no longer in place. Patient is currently intubated on ventilator support MV: 8.9 L/min Temp (24hrs), Avg:100.9 F (38.3 C), Min:97.9 F (36.6 C), Max:102.2 F (39 C)  Propofol: d/c due to hypotension  Diet Order:  Diet NPO time specified  Skin:  Wound (see comment) (stage 2 to sacrum)  Last BM:  6/30  Height:   Ht Readings from Last 1 Encounters:  03/27/16 5\' 2"  (1.575 m)    Weight:   Wt Readings from Last 1 Encounters:  03/27/16 147 lb 14.9 oz (67.1 kg)    Ideal Body Weight:  50 kg  BMI:  Body mass index is 27.05 kg/(m^2).  Estimated Nutritional Needs:   Kcal:  [id number]  Protein:  90-110 gm  Fluid:  1.6-1.8 L  EDUCATION NEEDS:   No education needs identified at this time  Molli Barrows, [place], [place], [place] Pager [phone] After Hours Pager [phone]

## 2016-03-27 NOTE — Care Management Note (Addendum)
Case Management Note  Patient Details  Name: Martha Mcbride MRN: [id number] Date of Birth: 16-Nov-1924  Subjective/Objective:   She presented to [facility] ED 06/29 with bloody emesis and black tarry stools. Pt was reportedly constipated for several days prior so was given an enema. She then had large BM consisting of melena followed by hematemesis.  In ED, she was minimally responsive therefore was intubated for airway protection. She was given 2u PRBC empirically and 3L IVF. Initial Hgb 11.3, Hct 33.8.                  Action/Plan:  Pt recent admit.  Pt recently discharged home with [facility] with [facility] - will ask for resumption orders prior to discharge   Expected Discharge Date:                  Expected Discharge Plan:  [place]  In-House Referral:     Discharge planning Services  CM Consult  Post Acute Care Choice:  Resumption of Svcs/PTA Provider Choice offered to:  Patient  DME Arranged:    DME Agency:     HH Arranged:  PT, Speech Therapy [place] Agency:  [place]  Status of Service:  In process, will continue to follow  If discussed at Long Length of Stay Meetings, dates discussed:    Additional Comments:  Maryclare Labrador, RN 03/27/2016, 3:42 PM

## 2016-03-27 NOTE — Consult Note (Signed)
WOC wound consult note Reason for Consult: Stage 2 pressure injury to sacrum, present on admission.  Currently with GI bleed.  Wound type: Stage 2 pressure injury Pressure Ulcer POA: Yes Measurement: 0.5 cm in diameter pink and denuded.  Daughter at bedside and states they have been treating with barrier cream.  Wound bed: pink and moist Drainage (amount, consistency, odor) Scant serous  No odor Periwound:Intact Dressing procedure/placement/frequency:Cleanse sacrum with soap and water.  Barrier cream twice daily.  Turn and reposition every two hours.  Will not follow at this time.  Please re-consult if needed.  Domenic Moras RN BSN [place] Pager [phone]

## 2016-03-27 NOTE — Consult Note (Signed)
[facility] CONSULT Reason for consult:GI Bleeding Referring Physician: critical care medicine. PCP: Dr. Delfina Redwood.   Martha Mcbride is an 82 y.o. female.  HPI: the patient is currently entubated on the ventilator. History is obtained from her daughters who are at the bedside. She is not been well for approximately 4 to 5 days and has been febrile and not eating well. She normally takes generic AcipHex every other day. This is for acid reflux. She has a history of large hiatal hernia in a history of the previous PEG tube that was subsequently removed. They had checked her temperature at home and had been giving her Advil and Tylenol alternating to keep her temperature down. Apparently it was running around 100-101. Prior to her admission she began to vomit bright red blood after drinking some type of chocolate shake and vomited several times. She presented to the emergency room minimally responsive, slightly hypotensive but initial hemoglobin was 11.3. She had a recent admission for pneumonia is on oxygen and prednisone. Was started on a Protonix drip. She was entubated. She does have a history of congestive heart failure. Chest x-ray showed bilateral infiltrates question of infection versus heart failure. The patient was transfused 2 units empirically in her hemoglobin is 12.1 this morning. Her initial WBC 22. Chest x-ray essentially unchanged. CT of the abdomen and chest showed diffuse adenopathy possibly due to metastatic tumor and a large hiatal hernia without clear massi in stomach. She had pleural effusions, ascites and anasarca with choledocholithiasis. No free air for any obvious cause of sepsis seen.  Past Medical History  Diagnosis Date  . Arthritis   . Polymyalgia ([place])   . Acid reflux   . Immune deficiency disorder (HCC)     autoimmune disorder - polymyalgia - on prednisone  . Breast cancer ([place])     Treated with lumpectomy and radiation  . Giardia   . Pinworms   . History of  hiatal hernia   . Pneumonia   . Dementia   . CHF (congestive heart failure) ([place])   . Dysrhythmia   . Diabetes mellitus without complication ([place])   . Aortic stenosis     a. Echo 6/17:  Mild LVH, EF 60-65%, no RWMA, Gr 2 DD, severe AS, mean AV gradient 76 mmHg, severe MAC, mild to mod MS, mean MV 6 mmHg/peak 14 mmHg, mod LAE, normal RVSF, PASP 39 mmHg    Past Surgical History  Procedure Laterality Date  . Breast lumpectomy    . Partial hysterectomy    . Tonsillectomy    . Tee without cardioversion N/A 07/20/2014    Procedure: TRANSESOPHAGEAL ECHOCARDIOGRAM (TEE);  Surgeon: Candee Furbish, MD;  Location: [facility] ENDOSCOPY;  Service: Cardiovascular;  Laterality: N/A;  . Abdominal hysterectomy      Family History  Problem Relation Age of Onset  . Stroke Mother     Social History:  reports that she has never smoked. She has never used smokeless tobacco. She reports that she does not drink alcohol or use illicit drugs.  Allergies:  Allergies  Allergen Reactions  . Adhesive [Tape] Other (See Comments)    Tears skin off, Please use "paper" tape  . Doxycycline Nausea And Vomiting  . Other Nausea And Vomiting and Other (See Comments)    Anesthesia makes her very sleepy and makes it hard for her to come out of it.   Marland Kitchen Penicillins Hives and Nausea And Vomiting    Has patient had a PCN reaction causing immediate rash, facial/tongue/throat  swelling, SOB or lightheadedness with hypotension: Yes Has patient had a PCN reaction causing severe rash involving mucus membranes or skin necrosis: No Has patient had a PCN reaction that required hospitalization No Has patient had a PCN reaction occurring within the last 10 years: No If all of the above answers are "NO", then may proceed with Cephalosporin use.   . Shellfish Allergy Nausea And Vomiting  . Sulfa Antibiotics Nausea And Vomiting  . Sulfamethoxazole Nausea And Vomiting  . Meperidine Hives and Rash    Unknown  . Methotrexate Rash     Bleeding under the skin    Medications; Prior to Admission medications   Medication Sig Start Date End Date Taking? Authorizing Provider  acetaminophen (TYLENOL) 80 MG chewable tablet Chew 80 mg by mouth every 6 (six) hours as needed for fever.   Yes Historical Provider, MD  aspirin 81 MG tablet Take 81 mg by mouth daily.   Yes Historical Provider, MD  BIOTIN PO Take 1 tablet by mouth daily.   Yes Historical Provider, MD  Cholecalciferol (VITAMIN D3) 2000 units capsule Take 2,000 Units by mouth daily.   Yes Historical Provider, MD  Cyanocobalamin (VITAMIN B-12 PO) Take 1 tablet by mouth daily.   Yes Historical Provider, MD  folic acid (FOLVITE) 1 MG tablet Take 1 mg by mouth daily.   Yes Historical Provider, MD  hydroxypropyl methylcellulose / hypromellose (ISOPTO TEARS / GONIOVISC) 2.5 % ophthalmic solution Place 1 drop into both eyes daily as needed for dry eyes.    Yes Historical Provider, MD  ibuprofen (ADVIL,MOTRIN) 100 MG chewable tablet Chew 100 mg by mouth every 8 (eight) hours as needed for fever.   Yes Historical Provider, MD  levothyroxine (SYNTHROID, LEVOTHROID) 25 MCG tablet Take 25 mcg by mouth daily before breakfast.   Yes Historical Provider, MD  OVER THE COUNTER MEDICATION Potassium Liquid: 15 ml's by mouth once daily if taking Lasix   Yes Historical Provider, MD  OVER THE COUNTER MEDICATION Ferrous Sulfate 45 mg: Take 1 tablet by mouth once daily   Yes Historical Provider, MD  predniSONE (DELTASONE) 5 MG tablet Take 5 mg by mouth every other day. 02/03/16  Yes Historical Provider, MD  RABEprazole (ACIPHEX) 20 MG tablet Take 20 mg by mouth every other day. 03/06/16  Yes Historical Provider, MD  saccharomyces boulardii (FLORASTOR) 250 MG capsule Take 250 mg by mouth every morning.    Yes Historical Provider, MD  triamcinolone cream (KENALOG) 0.1 % Apply 1 application topically daily as needed (for itching).  01/30/16  Yes Historical Provider, MD  cefUROXime (CEFTIN) 500 MG tablet Take  1 tablet (500 mg total) by mouth 2 (two) times daily with a meal. Patient not taking: Reported on 03/22/2016 03/09/16   Lonia Blood, MD  nystatin (MYCOSTATIN) 100000 UNIT/ML suspension Take 5 mLs (500,000 Units total) by mouth 4 (four) times daily. Patient not taking: Reported on 03/01/2016 03/09/16   Lonia Blood, MD   . antiseptic oral rinse  7 mL Mouth Rinse QID  . chlorhexidine gluconate (SAGE KIT)  15 mL Mouth Rinse BID  . fentaNYL (SUBLIMAZE) injection  50 mcg Intravenous Once  . hydrocortisone sod succinate (SOLU-CORTEF) inj  50 mg Intravenous Q6H  . insulin aspart  0-9 Units Subcutaneous Q4H  . levothyroxine  12.5 mcg Intravenous Daily  . LORazepam  2 mg Intravenous Once  . LORazepam  2 mg Intravenous Once  . pantoprazole (PROTONIX) IVPB  80 mg Intravenous QHS  . piperacillin-tazobactam (ZOSYN)  IV  3.375 g Intravenous Q8H  . vancomycin  500 mg Intravenous Q12H   PRN Meds sodium chloride, etomidate, fentaNYL, iopamidol, midazolam, midazolam, succinylcholine Results for orders placed or performed during the hospital encounter of 03/11/2016 (from the past 48 hour(s))  CBC WITH DIFFERENTIAL     Status: Abnormal   Collection Time: 03/16/2016  5:27 PM  Result Value Ref Range   WBC 24.4 (H) 4.0 - 10.5 K/uL   RBC 3.70 (L) 3.87 - 5.11 MIL/uL   Hemoglobin 11.3 (L) 12.0 - 15.0 g/dL   HCT 33.8 (L) 36.0 - 46.0 %   MCV 91.4 78.0 - 100.0 fL   MCH 30.5 26.0 - 34.0 pg   MCHC 33.4 30.0 - 36.0 g/dL   RDW 17.8 (H) 11.5 - 15.5 %   Platelets 142 (L) 150 - 400 K/uL   Neutrophils Relative % 52 %   Lymphocytes Relative 34 %   Monocytes Relative 4 %   Eosinophils Relative 6 %   Basophils Relative 1 %   Blasts 3 %   Neutro Abs 12.7 (H) 1.7 - 7.7 K/uL   Lymphs Abs 8.3 (H) 0.7 - 4.0 K/uL   Monocytes Absolute 1.0 0.1 - 1.0 K/uL   Eosinophils Absolute 1.5 (H) 0.0 - 0.7 K/uL   Basophils Absolute 0.2 (H) 0.0 - 0.1 K/uL  Comprehensive metabolic panel     Status: Abnormal   Collection Time:  03/12/2016  5:27 PM  Result Value Ref Range   Sodium 129 (L) 135 - 145 mmol/L   Potassium 5.1 3.5 - 5.1 mmol/L   Chloride 95 (L) 101 - 111 mmol/L   CO2 19 (L) 22 - 32 mmol/L   Glucose, Bld 111 (H) 65 - 99 mg/dL   BUN 45 (H) 6 - 20 mg/dL   Creatinine, Ser 1.03 (H) 0.44 - 1.00 mg/dL   Calcium 10.0 8.9 - 10.3 mg/dL   Total Protein 6.5 6.5 - 8.1 g/dL   Albumin 2.4 (L) 3.5 - 5.0 g/dL   AST 45 (H) 15 - 41 U/L   ALT 18 14 - 54 U/L   Alkaline Phosphatase 104 38 - 126 U/L   Total Bilirubin 1.4 (H) 0.3 - 1.2 mg/dL   GFR calc non Af Amer 47 (L) >60 mL/min   GFR calc Af Amer 55 (L) >60 mL/min    Comment: (NOTE) The eGFR has been calculated using the CKD EPI equation. This calculation has not been validated in all clinical situations. eGFR's persistently <60 mL/min signify possible Chronic Kidney Disease.    Anion gap 15 5 - 15  Protime-INR     Status: Abnormal   Collection Time: 03/21/2016  5:27 PM  Result Value Ref Range   Prothrombin Time 16.4 (H) 11.6 - 15.2 seconds   INR 1.31 0.00 - 1.49  Lipase, blood     Status: None   Collection Time: 03/05/2016  5:27 PM  Result Value Ref Range   Lipase 25 11 - 51 U/L  APTT     Status: None   Collection Time: 03/24/2016  5:27 PM  Result Value Ref Range   aPTT 35 24 - 37 seconds  Type and screen     Status: None   Collection Time: 03/06/2016  5:29 PM  Result Value Ref Range   ABO/RH(D) O NEG    Antibody Screen NEG    Sample Expiration 03/29/2016    Unit Number [id number]    Blood Component Type RBC LR PHER1    Unit division  00    Status of Unit ISSUED,FINAL    Unit tag comment VERBAL ORDERS PER DR NANAVATI    Transfusion Status OK TO TRANSFUSE    Crossmatch Result COMPATIBLE    Unit Number [id number]    Blood Component Type RBC LR PHER2    Unit division 00    Status of Unit ISSUED,FINAL    Unit tag comment VERBAL ORDERS PER DR NANAVATI    Transfusion Status OK TO TRANSFUSE    Crossmatch Result COMPATIBLE   ABO/Rh     Status: None    Collection Time: 03/13/2016  5:29 PM  Result Value Ref Range   ABO/RH(D) O NEG   Prepare fresh frozen plasma     Status: None   Collection Time: 02/27/2016  5:31 PM  Result Value Ref Range   Unit Number [id number]    Blood Component Type THAWED PLASMA    Unit division 00    Status of Unit REL FROM [facility]    Unit tag comment VERBAL ORDERS PER DR NANAVATI    Transfusion Status OK TO TRANSFUSE    Unit Number [id number]    Blood Component Type THAWED PLASMA    Unit division 00    Status of Unit REL FROM [facility]    Unit tag comment VERBAL ORDERS PER DR NANAVATI    Transfusion Status OK TO TRANSFUSE   I-Stat Chem 8, ED  (not at [facility], [facility])     Status: Abnormal   Collection Time: 03/01/2016  5:40 PM  Result Value Ref Range   Sodium 127 (L) 135 - 145 mmol/L   Potassium 4.8 3.5 - 5.1 mmol/L   Chloride 98 (L) 101 - 111 mmol/L   BUN 44 (H) 6 - 20 mg/dL   Creatinine, Ser 0.80 0.44 - 1.00 mg/dL   Glucose, Bld 109 (H) 65 - 99 mg/dL   Calcium, Ion 1.11 (L) 1.12 - 1.23 mmol/L   TCO2 19 0 - 100 mmol/L   Hemoglobin 11.9 (L) 12.0 - 15.0 g/dL   HCT 35.0 (L) 36.0 - 46.0 %  I-Stat CG4 Lactic Acid, ED  (not at [facility])     Status: Abnormal   Collection Time: 03/13/2016  5:41 PM  Result Value Ref Range   Lactic Acid, Venous 8.55 (HH) 0.5 - 1.9 mmol/L   Comment NOTIFIED PHYSICIAN   Prepare RBC     Status: None   Collection Time: 03/04/2016  5:43 PM  Result Value Ref Range   Order Confirmation ORDER PROCESSED BY BLOOD BANK   POC occult blood, ED RN will collect     Status: Abnormal   Collection Time: 03/02/2016  5:45 PM  Result Value Ref Range   Fecal Occult Bld POSITIVE (A) NEGATIVE  Urinalysis, Routine w reflex microscopic (not at [facility])     Status: None   Collection Time: 03/19/2016  6:53 PM  Result Value Ref Range   Color, Urine YELLOW YELLOW   APPearance CLEAR CLEAR   Specific Gravity, Urine 1.022 1.005 - 1.030   pH 5.5 5.0 - 8.0   Glucose, UA NEGATIVE NEGATIVE mg/dL   Hgb urine dipstick NEGATIVE  NEGATIVE   Bilirubin Urine NEGATIVE NEGATIVE   Ketones, ur NEGATIVE NEGATIVE mg/dL   Protein, ur NEGATIVE NEGATIVE mg/dL   Nitrite NEGATIVE NEGATIVE   Leukocytes, UA NEGATIVE NEGATIVE    Comment: MICROSCOPIC NOT DONE ON URINES WITH NEGATIVE PROTEIN, BLOOD, LEUKOCYTES, NITRITE, OR GLUCOSE <1000 mg/dL.  Prepare RBC     Status: None   Collection Time:  02/29/2016  7:04 PM  Result Value Ref Range   Order Confirmation ORDER PROCESSED BY BLOOD BANK   I-Stat arterial blood gas, ED     Status: Abnormal   Collection Time: 03/01/2016  8:26 PM  Result Value Ref Range   pH, Arterial 7.341 (L) 7.350 - 7.450   pCO2 arterial 39.2 35.0 - 45.0 mmHg   pO2, Arterial 137.0 (H) 80.0 - 100.0 mmHg   Bicarbonate 20.9 20.0 - 24.0 mEq/L   TCO2 22 0 - 100 mmol/L   O2 Saturation 99.0 %   Acid-base deficit 4.0 (H) 0.0 - 2.0 mmol/L   Patient temperature 38.3 C    Collection site RADIAL, ALLEN'S TEST ACCEPTABLE    Drawn by Operator    Sample type ARTERIAL   CBG monitoring, ED     Status: Abnormal   Collection Time: 03/03/2016 10:08 PM  Result Value Ref Range   Glucose-Capillary 113 (H) 65 - 99 mg/dL   Comment 1 Notify RN    Comment 2 Document in Chart   Lactate dehydrogenase     Status: Abnormal   Collection Time: 03/16/2016 10:14 PM  Result Value Ref Range   LDH 411 (H) 98 - 192 U/L  Troponin I     Status: Abnormal   Collection Time: 03/12/2016 10:14 PM  Result Value Ref Range   Troponin I 0.17 (HH) <0.03 ng/mL    Comment: CRITICAL RESULT CALLED TO, READ BACK BY AND VERIFIED WITH: [facility] RN 03/05/2016 2344 JORDANS   Lactic acid, plasma     Status: Abnormal   Collection Time: 03/10/2016 10:15 PM  Result Value Ref Range   Lactic Acid, Venous 3.8 (HH) 0.5 - 1.9 mmol/L    Comment: CRITICAL RESULT CALLED TO, READ BACK BY AND VERIFIED WITH: T. PHILLIPS RN [id number] GREEN R   CBC     Status: Abnormal   Collection Time: 03/15/2016 10:15 PM  Result Value Ref Range   WBC 22.2 (H) 4.0 - 10.5 K/uL   RBC 3.73 (L)  3.87 - 5.11 MIL/uL   Hemoglobin 11.2 (L) 12.0 - 15.0 g/dL   HCT 33.4 (L) 36.0 - 46.0 %   MCV 89.5 78.0 - 100.0 fL   MCH 30.0 26.0 - 34.0 pg   MCHC 33.5 30.0 - 36.0 g/dL   RDW 16.6 (H) 11.5 - 15.5 %   Platelets 116 (L) 150 - 400 K/uL    Comment: REPEATED TO VERIFY SPECIMEN CHECKED FOR CLOTS PLATELET COUNT CONFIRMED BY SMEAR   TSH     Status: None   Collection Time: 03/25/2016 10:18 PM  Result Value Ref Range   TSH 3.787 0.350 - 4.500 uIU/mL  Triglycerides     Status: None   Collection Time: 03/20/2016 10:20 PM  Result Value Ref Range   Triglycerides 135 <150 mg/dL  MRSA PCR Screening     Status: None   Collection Time: 03/18/2016 11:47 PM  Result Value Ref Range   MRSA by PCR NEGATIVE NEGATIVE    Comment:        The GeneXpert MRSA Assay (FDA approved for NASAL specimens only), is one component of a comprehensive MRSA colonization surveillance program. It is not intended to diagnose MRSA infection nor to guide or monitor treatment for MRSA infections.   Glucose, capillary     Status: Abnormal   Collection Time: 03/22/2016 11:59 PM  Result Value Ref Range   Glucose-Capillary 199 (H) 65 - 99 mg/dL  Troponin I     Status: Abnormal  Collection Time: 03/27/16 12:19 AM  Result Value Ref Range   Troponin I 0.21 (HH) <0.03 ng/mL    Comment: CRITICAL VALUE NOTED.  VALUE IS CONSISTENT WITH PREVIOUSLY REPORTED AND CALLED VALUE.  Lactic acid, plasma     Status: Abnormal   Collection Time: 03/27/16 12:19 AM  Result Value Ref Range   Lactic Acid, Venous 2.4 (HH) 0.5 - 1.9 mmol/L    Comment: CRITICAL RESULT CALLED TO, READ BACK BY AND VERIFIED WITH: [facility] RN 03/27/2016 0139 JORDANS   Hemoglobin and hematocrit, blood     Status: Abnormal   Collection Time: 03/27/16 12:19 AM  Result Value Ref Range   Hemoglobin 12.1 12.0 - 15.0 g/dL   HCT 66.5 (L) 96.6 - 07.2 %  I-STAT 3, arterial blood gas (G3+)     Status: Abnormal   Collection Time: 03/27/16  3:26 AM  Result Value Ref Range    pH, Arterial 7.345 (L) 7.350 - 7.450   pCO2 arterial 40.5 35.0 - 45.0 mmHg   pO2, Arterial 157.0 (H) 80.0 - 100.0 mmHg   Bicarbonate 22.0 20.0 - 24.0 mEq/L   TCO2 23 0 - 100 mmol/L   O2 Saturation 99.0 %   Acid-base deficit 3.0 (H) 0.0 - 2.0 mmol/L   Patient temperature 99.4 F    Collection site RADIAL, ALLEN'S TEST ACCEPTABLE    Drawn by RT    Sample type ARTERIAL   Glucose, capillary     Status: Abnormal   Collection Time: 03/27/16  3:30 AM  Result Value Ref Range   Glucose-Capillary 191 (H) 65 - 99 mg/dL  Basic metabolic panel     Status: Abnormal   Collection Time: 03/27/16  7:42 AM  Result Value Ref Range   Sodium 133 (L) 135 - 145 mmol/L   Potassium 5.1 3.5 - 5.1 mmol/L    Comment: HEMOLYSIS AT THIS LEVEL MAY AFFECT RESULT   Chloride 104 101 - 111 mmol/L   CO2 21 (L) 22 - 32 mmol/L   Glucose, Bld 211 (H) 65 - 99 mg/dL   BUN 34 (H) 6 - 20 mg/dL   Creatinine, Ser 1.03 0.44 - 1.00 mg/dL   Calcium 8.2 (L) 8.9 - 10.3 mg/dL   GFR calc non Af Amer >60 >60 mL/min   GFR calc Af Amer >60 >60 mL/min    Comment: (NOTE) The eGFR has been calculated using the CKD EPI equation. This calculation has not been validated in all clinical situations. eGFR's persistently <60 mL/min signify possible Chronic Kidney Disease.    Anion gap 8 5 - 15  Magnesium     Status: None   Collection Time: 03/27/16  7:42 AM  Result Value Ref Range   Magnesium 2.2 1.7 - 2.4 mg/dL  Phosphorus     Status: None   Collection Time: 03/27/16  7:42 AM  Result Value Ref Range   Phosphorus 3.7 2.5 - 4.6 mg/dL  Glucose, capillary     Status: Abnormal   Collection Time: 03/27/16  7:46 AM  Result Value Ref Range   Glucose-Capillary 250 (H) 65 - 99 mg/dL  Provider-confirm verbal Blood Bank order - RBC, FFP, Type & Screen; 2 Units; Order taken: 03/17/2016; 5:32 PM; Level 1 Trauma, Emergency Release, STAT Two units of uncrossmatched emergency release O negative red cells and two units of emergency releas     Status:  None   Collection Time: 03/27/16  8:30 AM  Result Value Ref Range   Blood product order confirm MD AUTHORIZATION REQUESTED  Dg Chest Port 1 View  03/27/2016  CLINICAL DATA:  Respiratory failure. EXAM: PORTABLE CHEST 1 VIEW COMPARISON:  03/04/2016. FINDINGS: Endotracheal tube tip 1.4 cm above the carina, proximal repositioning of approximately 2 cm should be considered. NG tube noted with tip projected over stomach. The side hole is just below the gastroesophageal junction, advancement of approximately 6 cm should be considered. Stable cardiomegaly. Progressive bilateral pulmonary infiltrates noted suggesting pulmonary edema. Bilateral pneumonia cannot be excluded. Small bilateral pleural effusions. No pneumothorax. IMPRESSION: 1. Endotracheal tube tip 1.4 cm above the carina, proximal repositioning approximately 2 cm should be considered. NG tube noted with tip projected over the stomach. The side hole is just below the gastroesophageal junction, advancement of approximately 6 cm should be considered . 2. Stable cardiomegaly. Progressive bilateral pulmonary infiltrates noted suggesting pulmonary edema. Bilateral pneumonia cannot be excluded. Small bilateral pleural effusions. Electronically Signed   By: Marcello Moores  Register   On: 03/27/2016 07:14   Dg Chest Port 1 View  03/21/2016  CLINICAL DATA:  Status post intubation EXAM: PORTABLE CHEST 1 VIEW COMPARISON:  03/06/2016 FINDINGS: Endotracheal tube with the tip 4 cm above the carina. Nasogastric tube with the tip coursing below the diaphragm, but the proximal port is in the distal esophagus; recommend advancing the nasogastric tube 7 cm. Bilateral chronic interstitial thickening. No pleural effusion or pneumothorax. Stable cardiomegaly. The osseous structures are unremarkable. IMPRESSION: 1. Endotracheal tube with the tip 4 cm above the carina. Electronically Signed   By: Kathreen Devoid   On: 02/27/2016 18:33   Dg Chest Portable 1 View  03/27/2016   CLINICAL DATA:  History pneumonia, GI bleed, CHF, diabetes mellitus, breast cancer EXAM: PORTABLE CHEST 1 VIEW COMPARISON:  Portable exam 1740 hours compared 03/07/2016 FINDINGS: Enlargement of cardiac silhouette with pulmonary vascular congestion. Atherosclerotic calcification aorta. Large hiatal hernia. BILATERAL pulmonary infiltrates which could represent pulmonary edema or infection. No pleural effusion or pneumothorax. Diffuse osseous demineralization. IMPRESSION: Enlargement of cardiac silhouette with pulmonary vascular congestion and suspect pulmonary edema though infection not excluded. Large hiatal hernia. Electronically Signed   By: Lavonia Dana M.D.   On: 03/12/2016 17:52   Ct Angio Chest/abd/pel For Dissection W And/or W/wo  03/25/2016  CLINICAL DATA:  Noted interstitial lung disease. Bloody emesis and black tori stools. Hypotensive. EXAM: CT VENOGRAM CHEST-ABD-PELV FOR DISSECTION W/ AND WO/W CM TECHNIQUE: Pre and postcontrast images were obtained of the chest with MIP imaging. Arterial phase imaging was obtained through the abdomen and pelvis. CONTRAST:  100 cc of Isovue 370 COMPARISON:  CT of the chest March 06, 2016 and CT of the abdomen and pelvis October 16, 2014 FINDINGS: CT of the chest: There are small to moderate bilateral pleural effusions which are new in the interval with associated compressive atelectasis. Findings of interstitial lung disease are again identified, better described on the CT of the chest from March 06, 2016. The nodule in the right lung on axial image 54 measures 19 by 14 mm today, unchanged in the short interval. The other nodularity seen previously is difficult to assess due to respiratory motion and atelectasis in the bases. The patient has been intubated. The ETT terminates 15 mm above the carina. Airways are otherwise unremarkable. Adenopathy remains in the chest including in the bilateral axillary regions, subpectoral regions, mediastinum, and hila. There is a moderate  hiatal hernia. An NG tube terminates in the stomach below the diaphragm. The central great vessels demonstrate no aneurysm. Coronary artery calcifications are noted. The heart size  is unchanged and the heart is stable in appearance. Increased attenuation in the subcutaneous fat consistent with edema. Atherosclerotic changes seen in the thoracic aorta. No aneurysm or dissection. No central pulmonary emboli identified. CT of the abdomen and pelvis: No free air. There is ascites identified, most marked in in the right upper abdomen adjacent to the liver, duodenum, and gallbladder. The attenuation is less than 10, consistent with simple fluid. No abscess. The free fluid also extends into the pelvis. No aneurysm in the abdominal aorta. No dissection. Atherosclerotic change seen in the aorta and its branching vessels. There is mild resulting narrowing of the proximal SMA which is probably not acute. There is a low-attenuation lesion in the left hepatic lobe a on axial image 129 measuring 8 mm, not seen previously. A low-attenuation lesion in the right hepatic lobe on image 116 appears be will larger in the interval but is better seen at least partially due to presence of contrast. Another lesion on axial image 105 is probably not changed. The gallbladder is not well assessed due to surrounding ascites. However, cholelithiasis is again identified. The spleen measures up to 10.4 cm which is within normal limits by size but more prominent in the interval. There is adenopathy in the retroperitoneum with was not present in January of 2016 and was not imaged on the comparison CT scan. This extends into the pelvis and inguinal regions. No adrenal abnormalities are identified. The pancreas is unremarkable. Tiny cysts are seen in the kidneys with no suspicious masses or obstruction. An IVC filter is identified. The colon is not well assessed due the lack of oral contrast. No definitive colonic abnormalities are seen. The appendix  is not visualized but there is no secondary evidence of appendicitis. The distal small bowel is well opacified with contrast and normal in appearance. The duodenum and proximal jejunum is not well assessed but no obvious acute abnormalities are noted. There is edema/ anasarca in the abdominal pelvic wall. The cystic mass in the right side of the pelvis measuring 5 cm is not significantly changed in the interval. The patient is status post hysterectomy. The bladder is decompressed with a Foley catheter. Adenopathy as described above. A representative node in the upper abdomen on axial image 154 measures 18 by 22 mm. A representative left inguinal node on axial image 270 measures 3.1 x 1.7 cm. Degenerative changes in the spine with no acute bony findings IMPRESSION: 1. No aortic aneurysm or dissection.  Atherosclerosis.  Sign a 2. New small to moderate bilateral pleural effusions with atelectasis. 3. At least 1 of the nodules seen previously in the lung remains. The other nodules are not as well assessed due to motion and atelectasis. Recommend follow-up after the patient's symptoms have resolved to determine the stability of these nodules. 4. Interstitial lung disease, unchanged. 5. Adenopathy in the chest, abdomen, and pelvis is concerning for metastatic disease given the history of breast cancer. 6. Moderate to large hiatal hernia. No definitive cause for bleeding in the bowel is identified. 7. There is ascites primarily in the right upper quadrant pelvis. The source of ascites is unclear. Why the ascites appears to be centered on the right is also not clear. 8. Cholelithiasis. An ultrasound could better evaluate the gallbladder. 9. Stable right pelvic mass. 10. Subcutaneous edema/anasarca. Electronically Signed   By: Dorise Bullion III M.D   On: 03/08/2016 20:59   ROS: unobtainable              Blood  pressure 126/48, pulse 61, temperature 98.3 F (36.8 C), temperature source Oral, resp. rate 18,  height '5\' 2"'$  (1.575 m), weight 67.1 kg (147 lb 14.9 oz), SpO2 100 %.  Physical exam:   General-- intubated and nonresponsive  ENT-- NG training small amount of dark material no BRB   Heart--diminished heart sounds no gross murmurs Lungs-- course breath sounds  Abdomen-- nondistended nontender     Assessment: 1. Upper G.I. bleed. This appears to stabilize. May well been Mallory-Weiss tear. The patient had been on AcipHex but had been taken intermittently. She had been receiving some NSAIDs but only for several days according to the daughter. Her hemoglobin seems relatively stable. She will likely need EGD at some point after other issues have been addressed.  2. Recent illness with 3 -4 days of fever, anorexia and nausea of unclear cause  Plan: 1. Continue Protonix drip for now.  2. She will need EGD. Hopefully she will stabilize and this can be done when are underlying problems have been addressed. If she bleeds again acutely, we may need to do this sooner. We will follow.    Tyjon Bowen JR,Margan Elias L 03/27/2016, 10:13 AM   This note was created using voice recognition software and minor errors may Have occurred unintentionally. Pager: [phone] If no answer or after hours call [phone]

## 2016-03-28 ENCOUNTER — Inpatient Hospital Stay (HOSPITAL_COMMUNITY): Payer: Medicare Other

## 2016-03-28 DIAGNOSIS — R579 Shock, unspecified: Secondary | ICD-10-CM

## 2016-03-28 DIAGNOSIS — J9601 Acute respiratory failure with hypoxia: Secondary | ICD-10-CM

## 2016-03-28 LAB — ECHOCARDIOGRAM COMPLETE
AV Area VTI: 0.55 cm2
AV Mean grad: 59 mmHg
AV area mean vel ind: 0.31 cm2/m2
AV vel: 0.59
AVA: 0.59 cm2
AVAREAMEANV: 0.54 cm2
AVAREAVTIIND: 0.34 cm2/m2
AVCELMEANRAT: 0.21
AVPG: 97 mmHg
AVPKVEL: 492 cm/s
Ao pk vel: 0.22 m/s
Area-P 1/2: 2.24 cm2
CHL CUP AV PEAK INDEX: 0.32
CHL CUP AV VALUE AREA INDEX: 0.34
CHL CUP LVOT MV VTI: 1.27
CHL CUP MV DEC (S): 317
CHL CUP TV REG PEAK VELOCITY: 265 cm/s
DOP CAL AO MEAN VELOCITY: 361 cm/s
EERAT: 33.33
EWDT: 317 ms
FS: 27 % — AB (ref 28–44)
Height: 62 in
IV/PV OW: 1.54
LA ID, A-P, ES: 34 mm
LA diam end sys: 34 mm
LA diam index: 1.97 cm/m2
LA vol A4C: 93.6 ml
LA vol index: 53.6 mL/m2
LAVOL: 92.8 mL
LV E/e' medial: 33.33
LV E/e'average: 33.33
LV PW d: 12.4 mm — AB (ref 0.6–1.1)
LV TDI E'MEDIAL: 4.35
LV e' LATERAL: 5.55 cm/s
LVOT MV VTI INDEX: 0.73 cm2/m2
LVOT VTI: 29.6 cm
LVOT area: 2.54 cm2
LVOT peak VTI: 0.23 cm
LVOT peak vel: 107 cm/s
LVOTD: 18 mm
LVOTSV: 75 mL
MV Annulus VTI: 59.2 cm
MV M vel: 125
MV Peak grad: 14 mmHg
MV pk A vel: 116 m/s
MV pk E vel: 185 m/s
MVG: 7 mmHg
P 1/2 time: 98 ms
TAPSE: 21.3 mm
TDI e' lateral: 5.55
TR max vel: 265 cm/s
VTI: 128 cm
Weight: 2518.54 oz

## 2016-03-28 LAB — CBC WITH DIFFERENTIAL/PLATELET
BASOS PCT: 1 %
Basophils Absolute: 0.1 10*3/uL (ref 0.0–0.1)
Eosinophils Absolute: 0 10*3/uL (ref 0.0–0.7)
Eosinophils Relative: 0 %
HCT: 29.3 % — ABNORMAL LOW (ref 36.0–46.0)
Hemoglobin: 9.7 g/dL — ABNORMAL LOW (ref 12.0–15.0)
LYMPHS ABS: 4.1 10*3/uL — AB (ref 0.7–4.0)
Lymphocytes Relative: 28 %
MCH: 30.7 pg (ref 26.0–34.0)
MCHC: 33.1 g/dL (ref 30.0–36.0)
MCV: 92.7 fL (ref 78.0–100.0)
MONO ABS: 1.6 10*3/uL — AB (ref 0.1–1.0)
MONOS PCT: 11 %
NEUTROS ABS: 9 10*3/uL — AB (ref 1.7–7.7)
Neutrophils Relative %: 60 %
PLATELETS: 123 10*3/uL — AB (ref 150–400)
RBC: 3.16 MIL/uL — ABNORMAL LOW (ref 3.87–5.11)
RDW: 18.1 % — AB (ref 11.5–15.5)
WBC: 14.8 10*3/uL — ABNORMAL HIGH (ref 4.0–10.5)

## 2016-03-28 LAB — GLUCOSE, CAPILLARY
GLUCOSE-CAPILLARY: 132 mg/dL — AB (ref 65–99)
Glucose-Capillary: 132 mg/dL — ABNORMAL HIGH (ref 65–99)
Glucose-Capillary: 121 mg/dL — ABNORMAL HIGH (ref 65–99)
Glucose-Capillary: 115 mg/dL — ABNORMAL HIGH (ref 65–99)
Glucose-Capillary: 130 mg/dL — ABNORMAL HIGH (ref 65–99)

## 2016-03-28 LAB — URINE CULTURE: CULTURE: NO GROWTH

## 2016-03-28 LAB — BASIC METABOLIC PANEL
Anion gap: 6 (ref 5–15)
BUN: 26 mg/dL — AB (ref 6–20)
CALCIUM: 7.8 mg/dL — AB (ref 8.9–10.3)
CHLORIDE: 107 mmol/L (ref 101–111)
CO2: 21 mmol/L — ABNORMAL LOW (ref 22–32)
CREATININE: 0.71 mg/dL (ref 0.44–1.00)
GFR calc Af Amer: >60 mL/min (ref >60)
Glucose, Bld: 130 mg/dL — ABNORMAL HIGH (ref 65–99)
Potassium: 4.3 mmol/L (ref 3.5–5.1)
SODIUM: 134 mmol/L — AB (ref 135–145)

## 2016-03-28 LAB — TRIGLYCERIDES: Triglycerides: 85 mg/dL (ref <150)

## 2016-03-28 LAB — PROCALCITONIN: PROCALCITONIN: 0.25 ng/mL

## 2016-03-28 LAB — LACTATE DEHYDROGENASE: LDH: 293 U/L — AB (ref 98–192)

## 2016-03-28 LAB — TSH: TSH: 1.471 u[IU]/mL (ref 0.350–4.500)

## 2016-03-28 MED ORDER — FENTANYL CITRATE (PF) 100 MCG/2ML IJ SOLN
25.0000 ug | INTRAMUSCULAR | Status: DC | PRN
  Administered 2016-03-28: 100 ug via INTRAVENOUS
  Administered 2016-03-29: 25 ug via INTRAVENOUS
  Administered 2016-03-29: 100 ug via INTRAVENOUS
  Filled 2016-03-28 (×3): qty 2

## 2016-03-28 MED ORDER — VITAL AF 1.2 CAL PO LIQD
1000.0000 mL | ORAL | Status: DC
  Administered 2016-03-28 – 2016-04-02 (×5): 1000 mL
  Filled 2016-03-28 (×3): qty 1000

## 2016-03-28 MED ORDER — PRO-STAT SUGAR FREE PO LIQD: 30.0000 mL | Freq: Two times a day (BID) | ORAL | Status: DC

## 2016-03-28 MED ORDER — HYDROCORTISONE NA SUCCINATE PF 100 MG IJ SOLR
50.0000 mg | Freq: Two times a day (BID) | INTRAMUSCULAR | Status: DC
  Administered 2016-03-28 – 2016-04-02 (×10): 50 mg via INTRAVENOUS
  Filled 2016-03-28 (×11): qty 1

## 2016-03-28 NOTE — Progress Notes (Signed)
Nutrition Follow-up / Consult  DOCUMENTATION CODES:   Not applicable  INTERVENTION:    Initiate TF via OGT with Vital AF 1.2 at 55 ml/h to provide 1320 ml, 1584 kcals, 99 gm protein, 1071 ml free water daily.  NUTRITION DIAGNOSIS:   Inadequate oral intake related to inability to eat as evidenced by NPO status.  Ongoing  GOAL:   Patient will meet greater than or equal to 90% of their needs  Unmet  MONITOR:   Vent status, Labs, Weight trends, I & O's, Skin  REASON FOR ASSESSMENT:   Ventilator    ASSESSMENT:   82 y.o. female with PMH as outlined below including ILD for which she is on nocturnal O2 and is followed by Dr. Elsworth Soho. She presented to [facility] ED 06/29 with bloody emesis and black tarry stools.  Labs reviewed: sodium low. Medications reviewed and include solu-cortef. Received MD Consult for TF initiation and management. Patient is currently intubated on ventilator support MV: 7.4 L/min Temp (24hrs), Avg:97.3 F (36.3 C), Min:96.4 F (35.8 C), Max:98.7 F (37.1 C)   Diet Order:  Diet NPO time specified  Skin:  Wound (see comment) (stage 2 to sacrum)  Last BM:  7/1  Height:   Ht Readings from Last 1 Encounters:  03/27/16 5\' 2"  (1.575 m)    Weight:   Wt Readings from Last 1 Encounters:  03/28/16 157 lb 6.5 oz (71.4 kg)    Ideal Body Weight:  50 kg  BMI:  Body mass index is 28.78 kg/(m^2).  Estimated Nutritional Needs:   Kcal:  [id number]  Protein:  90-110 gm  Fluid:  1.6-1.8 L  EDUCATION NEEDS:   No education needs identified at this time  Molli Barrows, [place], [place], [place] Pager [phone] After Hours Pager [phone]

## 2016-03-28 NOTE — Progress Notes (Signed)
  Echocardiogram 2D Echocardiogram has been performed.  Darlina Sicilian M 03/28/2016, 12:15 PM

## 2016-03-28 NOTE — Progress Notes (Signed)
PULMONARY / CRITICAL CARE MEDICINE   Name: Martha Mcbride MRN: [id number] DOB: Feb 23, 1925    ADMISSION DATE:  03/22/2016 CONSULTATION DATE:  03/17/2016  REFERRING MD:  Kathrynn Humble (EDP)  CHIEF COMPLAINT:  GI Bleed  BRIEF   Martha Mcbride is a 82 y.o. female with PMH as outlined below including ILD for which she is on nocturnal O2 and is followed by Dr. Elsworth Soho.  She presented to [facility] ED 06/29 with bloody emesis and black tarry stools.  Pt was reportedly constipated for several days prior so was given an enema.  She then had large BM consisting of melena followed by hematemesis.  In ED, she was minimally responsive therefore was intubated for airway protection.  She was given 2u PRBC empirically and 3L IVF.  Initial Hgb 11.3, Hct 33.8.  Post intubation, she was hypotensive; therefore, propofol was discontinued.  Dr. Paulita Fujita with [facility] was consulted by EDP.  Pt is on daily ASA 81mg , no additional anti-platelets or anticoagulants.  She had recent admission 03/07/16 through 03/09/16 for pneumonia and was on oxygen and prednisone at home at time of admission   has a past medical history of Arthritis; Polymyalgia ([place]); Acid reflux; Immune deficiency disorder ([place]); Breast cancer ([place]); Giardia; Pinworms; History of hiatal hernia; Pneumonia; Dementia; CHF (congestive heart failure) ([place]); Dysrhythmia; Diabetes mellitus without complication ([place]); and Aortic stenosis.   LINES/TUBES: OETT 7.5 6/29 >> OGT 6/29 >> FOLEY 6/29 >> PIV x 3  6/29 >> PEG >>   EVENTS 6/29 - admitted with UGIB, intubated for airway protection.   CXR 6/29 > pulmonary vascular congestion. CTA chest / abd / pelv 6/29 > pleural effusions with atelectasis, stable ILD, adenopathy of the chest abdomen and pelvis concerning for metastasis, large hiatal hernia, ascites in RUQ, cholelithiasis CXR 6/30 >> stable cardiomegaly ; progressive bilateral pulmonary infiltrates/ pulmonary edema; small bilateral pleural  effusions.    6/30?17:  Patient remains critically oh on low-dose vasopressor support. Switching to peripheral Neo-Synephrine. Checking serum LDH as well as additional labs and right upper quadrant ultrasound with potential for cholecystitis as the cause for patient's decompensation. Appreciate GI evaluation and recommendations at this time. Ongoing family discussions once we have a better understanding of the etiology of the patient's acute decompensation.  6/30 indicates suspected Upper GI Bleed/ Mallory Weiss tear.with plans for EGD once patient has stabilized.She is a DNR with suspected breast cancer metastasis and previously stated she does not want aggressive care    SUBJECTIVE/OVERNIGHT/INTERVAL HX 03/28/16: did SBT but Rt says too weak to extubate. On neo and needing to go up on dose. Family @ bedside. WOC notes stage 2 pressure injur. On tube feeds and protonix gtt. GI notes rec holding off endoscopy til improved; feels no active bleeding     VITAL SIGNS: BP 99/43 mmHg  Pulse 64  Temp(Src) 96.8 F (36 C) (Core (Comment))  Resp 20  Ht 5\' 2"  (1.575 m)  Wt 71.4 kg (157 lb 6.5 oz)  BMI 28.78 kg/m2  SpO2 100%  HEMODYNAMICS:    VENTILATOR SETTINGS: Vent Mode:  [-] PSV FiO2 (%):  [40 %] 40 % Set Rate:  [20 bmp] 20 bmp Vt Set:  [420 mL] 420 mL PEEP:  [5 cmH20] 5 cmH20 Pressure Support:  [5 cmH20] 5 cmH20 Plateau Pressure:  [11 cmH20-23 cmH20] 22 cmH20  INTAKE / OUTPUT: I/O last 3 completed shifts: In: 11219.8 [I.V.:10629.8; NG/GT:90; IV [id number] Out: [id number] [Urine:2955; Emesis/NG output:200; Drains:250]   PHYSICAL EXAMINATION: General: Elderly chronically  ill appearing female, critically ill. Neuro: Sedated, non-responsive. HEENT: [place]/AT. PERRL, sclerae anicteric. Cardiovascular: RRR, 3/6 SEM radiating into carotids.  Lungs: Respirations even and unlabored.  Coarse bilaterally. Abdomen: BS x 4, soft, NT/ND.  Musculoskeletal: No gross deformities, no edema.  Skin:  Intact, warm, no rashes.  LABS: PULMONARY  Recent Labs Lab 03/21/2016 1740 03/03/2016 2026 03/27/16 0326  PHART  --  7.341* 7.345*  PCO2ART  --  39.2 40.5  PO2ART  --  137.0* 157.0*  HCO3  --  20.9 22.0  TCO2 19 22 23   O2SAT  --  99.0 99.0    CBC  Recent Labs Lab 03/10/2016 2215  03/27/16 1028 03/27/16 1508 03/28/16 0239  HGB 11.2*  < > 10.9* 10.5* 9.7*  HCT 33.4*  < > 32.2* 31.1* 29.3*  WBC 22.2*  --  20.9*  --  14.8*  PLT 116*  --  147*  --  123*  < > = values in this interval not displayed.  COAGULATION  Recent Labs Lab 03/24/2016 1727  INR 1.31    CARDIAC   Recent Labs Lab 03/25/2016 2214 03/27/16 0019  TROPONINI 0.17* 0.21*   No results for input(s): PROBNP in the last 168 hours.   CHEMISTRY  Recent Labs Lab 02/29/2016 1727 03/09/2016 1740 03/27/16 0742 03/27/16 1508 03/28/16 0239  NA 129* 127* 133*  --  134*  K 5.1 4.8 5.1 4.5 4.3  CL 95* 98* 104  --  107  CO2 19*  --  21*  --  21*  GLUCOSE 111* 109* 211*  --  130*  BUN 45* 44* 34*  --  26*  CREATININE 1.03* 0.80 0.68  --  0.71  CALCIUM 10.0  --  8.2*  --  7.8*  MG  --   --  2.2  --   --   PHOS  --   --  3.7  --   --    Estimated Creatinine Clearance: 45 mL/min (by C-G formula based on Cr of 0.71).   LIVER  Recent Labs Lab 03/13/2016 1727 03/27/16 1508  AST 45* 28  ALT 18 14  ALKPHOS 104 73  BILITOT 1.4* 1.1  PROT 6.5 5.4*  ALBUMIN 2.4* 1.9*  INR 1.31  --      INFECTIOUS  Recent Labs Lab 03/09/2016 2215 03/27/16 0019 03/27/16 1508  LATICACIDVEN 3.8* 2.4* 2.1*     ENDOCRINE CBG (last 3)   Recent Labs  03/28/16 0357 03/28/16 0804 03/28/16 1200  GLUCAP 132* 121* 132*         IMAGING x48h  - image(s) personally visualized  -   highlighted in bold US Abdomen Port  03/27/2016  CLINICAL DATA:  Nausea and vomiting 4-5 days. EXAM: ABDOMEN ULTRASOUND COMPLETE COMPARISON:  CT 10/16/2014 FINDINGS: Gallbladder: Moderate cholelithiasis with several stones over the  gallbladder neck. Largest stone measures 1.1 cm. Borderline gallbladder wall thickening measuring 3 mm. Small amount of pericholecystic fluid. Negative sonographic Murphy sign. Common bile duct: Diameter: 3 mm. Liver: No focal lesion identified. Within normal limits in parenchymal echogenicity. IVC: No abnormality visualized. Pancreas: Not well visualized. Spleen: Size and appearance within normal limits. Right Kidney: Length: 9.9 cm. Echogenicity within normal limits. No mass or hydronephrosis visualized. Left Kidney: Length: 11.0 cm. Echogenicity within normal limits. No mass or hydronephrosis visualized. Abdominal aorta: No aneurysm visualized. Other findings: Small right pleural effusion. IMPRESSION: Moderate cholelithiasis with borderline wall thickening. Negative sonographic Murphy sign and tiny amount of pericholecystic fluid. Recommend clinical correlation for acute  cholecystitis. HIDA scan may be helpful for further evaluation. Electronically Signed   By: Marin Olp M.D.   On: 03/27/2016 21:19   Dg Chest Port 1 View  03/28/2016  CLINICAL DATA:  Respiratory failure EXAM: PORTABLE CHEST 1 VIEW COMPARISON:  03/27/2016 FINDINGS: Nasogastric catheter is noted within the stomach. Endotracheal tube is seen in satisfactory position. Aortic calcifications are again noted as are mitral annular calcifications. Diffuse bilateral infiltrative densities are again seen and stable. No new focal abnormality is seen. IMPRESSION: Stable appearance of the chest when compare with the prior exam. Aortic atherosclerosis Electronically Signed   By: Inez Catalina M.D.   On: 03/28/2016 07:39   Dg Chest Port 1 View  03/27/2016  CLINICAL DATA:  Respiratory failure. EXAM: PORTABLE CHEST 1 VIEW COMPARISON:  03/19/2016. FINDINGS: Endotracheal tube tip 1.4 cm above the carina, proximal repositioning of approximately 2 cm should be considered. NG tube noted with tip projected over stomach. The side hole is just below the  gastroesophageal junction, advancement of approximately 6 cm should be considered. Stable cardiomegaly. Progressive bilateral pulmonary infiltrates noted suggesting pulmonary edema. Bilateral pneumonia cannot be excluded. Small bilateral pleural effusions. No pneumothorax. IMPRESSION: 1. Endotracheal tube tip 1.4 cm above the carina, proximal repositioning approximately 2 cm should be considered. NG tube noted with tip projected over the stomach. The side hole is just below the gastroesophageal junction, advancement of approximately 6 cm should be considered . 2. Stable cardiomegaly. Progressive bilateral pulmonary infiltrates noted suggesting pulmonary edema. Bilateral pneumonia cannot be excluded. Small bilateral pleural effusions. Electronically Signed   By: Marcello Moores  Register   On: 03/27/2016 07:14   Dg Chest Port 1 View  03/16/2016  CLINICAL DATA:  Status post intubation EXAM: PORTABLE CHEST 1 VIEW COMPARISON:  03/06/2016 FINDINGS: Endotracheal tube with the tip 4 cm above the carina. Nasogastric tube with the tip coursing below the diaphragm, but the proximal port is in the distal esophagus; recommend advancing the nasogastric tube 7 cm. Bilateral chronic interstitial thickening. No pleural effusion or pneumothorax. Stable cardiomegaly. The osseous structures are unremarkable. IMPRESSION: 1. Endotracheal tube with the tip 4 cm above the carina. Electronically Signed   By: Kathreen Devoid   On: 03/15/2016 18:33   Dg Chest Portable 1 View  03/11/2016  CLINICAL DATA:  History pneumonia, GI bleed, CHF, diabetes mellitus, breast cancer EXAM: PORTABLE CHEST 1 VIEW COMPARISON:  Portable exam 1740 hours compared 03/07/2016 FINDINGS: Enlargement of cardiac silhouette with pulmonary vascular congestion. Atherosclerotic calcification aorta. Large hiatal hernia. BILATERAL pulmonary infiltrates which could represent pulmonary edema or infection. No pleural effusion or pneumothorax. Diffuse osseous demineralization.  IMPRESSION: Enlargement of cardiac silhouette with pulmonary vascular congestion and suspect pulmonary edema though infection not excluded. Large hiatal hernia. Electronically Signed   By: Lavonia Dana M.D.   On: 03/18/2016 17:52   Ct Angio Chest/abd/pel For Dissection W And/or W/wo  03/25/2016  CLINICAL DATA:  Noted interstitial lung disease. Bloody emesis and black tori stools. Hypotensive. EXAM: CT VENOGRAM CHEST-ABD-PELV FOR DISSECTION W/ AND WO/W CM TECHNIQUE: Pre and postcontrast images were obtained of the chest with MIP imaging. Arterial phase imaging was obtained through the abdomen and pelvis. CONTRAST:  100 cc of Isovue 370 COMPARISON:  CT of the chest March 06, 2016 and CT of the abdomen and pelvis October 16, 2014 FINDINGS: CT of the chest: There are small to moderate bilateral pleural effusions which are new in the interval with associated compressive atelectasis. Findings of interstitial lung disease are again  identified, better described on the CT of the chest from March 06, 2016. The nodule in the right lung on axial image 54 measures 19 by 14 mm today, unchanged in the short interval. The other nodularity seen previously is difficult to assess due to respiratory motion and atelectasis in the bases. The patient has been intubated. The ETT terminates 15 mm above the carina. Airways are otherwise unremarkable. Adenopathy remains in the chest including in the bilateral axillary regions, subpectoral regions, mediastinum, and hila. There is a moderate hiatal hernia. An NG tube terminates in the stomach below the diaphragm. The central great vessels demonstrate no aneurysm. Coronary artery calcifications are noted. The heart size is unchanged and the heart is stable in appearance. Increased attenuation in the subcutaneous fat consistent with edema. Atherosclerotic changes seen in the thoracic aorta. No aneurysm or dissection. No central pulmonary emboli identified. CT of the abdomen and pelvis: No free air.  There is ascites identified, most marked in in the right upper abdomen adjacent to the liver, duodenum, and gallbladder. The attenuation is less than 10, consistent with simple fluid. No abscess. The free fluid also extends into the pelvis. No aneurysm in the abdominal aorta. No dissection. Atherosclerotic change seen in the aorta and its branching vessels. There is mild resulting narrowing of the proximal SMA which is probably not acute. There is a low-attenuation lesion in the left hepatic lobe a on axial image 129 measuring 8 mm, not seen previously. A low-attenuation lesion in the right hepatic lobe on image 116 appears be will larger in the interval but is better seen at least partially due to presence of contrast. Another lesion on axial image 105 is probably not changed. The gallbladder is not well assessed due to surrounding ascites. However, cholelithiasis is again identified. The spleen measures up to 10.4 cm which is within normal limits by size but more prominent in the interval. There is adenopathy in the retroperitoneum with was not present in January of 2016 and was not imaged on the comparison CT scan. This extends into the pelvis and inguinal regions. No adrenal abnormalities are identified. The pancreas is unremarkable. Tiny cysts are seen in the kidneys with no suspicious masses or obstruction. An IVC filter is identified. The colon is not well assessed due the lack of oral contrast. No definitive colonic abnormalities are seen. The appendix is not visualized but there is no secondary evidence of appendicitis. The distal small bowel is well opacified with contrast and normal in appearance. The duodenum and proximal jejunum is not well assessed but no obvious acute abnormalities are noted. There is edema/ anasarca in the abdominal pelvic wall. The cystic mass in the right side of the pelvis measuring 5 cm is not significantly changed in the interval. The patient is status post hysterectomy. The  bladder is decompressed with a Foley catheter. Adenopathy as described above. A representative node in the upper abdomen on axial image 154 measures 18 by 22 mm. A representative left inguinal node on axial image 270 measures 3.1 x 1.7 cm. Degenerative changes in the spine with no acute bony findings IMPRESSION: 1. No aortic aneurysm or dissection.  Atherosclerosis.  Sign a 2. New small to moderate bilateral pleural effusions with atelectasis. 3. At least 1 of the nodules seen previously in the lung remains. The other nodules are not as well assessed due to motion and atelectasis. Recommend follow-up after the patient's symptoms have resolved to determine the stability of these nodules. 4. Interstitial lung disease,  unchanged. 5. Adenopathy in the chest, abdomen, and pelvis is concerning for metastatic disease given the history of breast cancer. 6. Moderate to large hiatal hernia. No definitive cause for bleeding in the bowel is identified. 7. There is ascites primarily in the right upper quadrant pelvis. The source of ascites is unclear. Why the ascites appears to be centered on the right is also not clear. 8. Cholelithiasis. An ultrasound could better evaluate the gallbladder. 9. Stable right pelvic mass. 10. Subcutaneous edema/anasarca. Electronically Signed   By: Dorise Bullion III M.D   On: 04/16/16 20:59        STUDIES:  MICROBIOLOGY: MRSA PCR 04-17-23:  Negative Blood Ctx x2 17-Apr-2023 > Urine Ctx 2023-04-17 > Trach Asp Ctx 6/30 >  ANTIBIOTICS: Vancomycin 04-17-23 > Zosyn 04/17/23 >  DISCUSSION: 82 y.o. F admitted 04-17-23 with hemorrhagic shock due to UGIB and required intubation in ED for airway protection.  GI consulted by EDP . Note .She is currently unresponsive on the ventilator. CXR >> 6/30  indicates bilateral infiltrates vs pulmonary edema, pressor dependent. Will need to have discussion with family regarding realistic goals of care versus continued escalation of care.If she cannot be liberated from life  support, progress to comfort care.  ASSESSMENT / PLAN:  GASTROINTESTINAL A:   UGIB  H/O GERD Cholelithiasis - On CT imaging.   - 03/28/16 - does not seem to have active bleding. GI advising continued conservative Rx [place] with gall stones - no coment from GI about HIDAA  P:   GI following , appreciate the assistance.- will ask HIDA need clarification Continue PPI gtt per GI. EGD when stable per GI Start TF   HEMATOLOGIC / ONCOLOGIC A:   Anemia - Secondary to UGIB. Leukocytosis - Improving. Likely stress response vs sepsis. Adenopathy - Seen on CT. H/O Breast Cancer  - no active blled  P:  Trend cell counts daily w/ CBC Transfuse for Hgb < 7. Repeat Hgb/Hct at 1700 SCD's   CARDIOVASCULAR A:  Shock - Hemorrhagic due to UGIB. H/O CHF (Echo from 03/17/16 with EF 60-65%, severe aortic stenosis) H/O Severe Aortic Stenosis  - needing neo and on hydrocort/ ECHO 03/28/16 - similar to June 2017  P:  Monitor on telemetry Vitals per unit protocol  Neo-Synephrine to maintain MAP >65 & SBP >90 Cut down fluid to kvo  INFECTIOUS A:   Sepsis - Possible intra-abdominal source.   unclear if septick  P:   Check PCT and decide on dc abx Abx as above (vanc / zosyn).  Follow cultures as above. Re-culture for Temp >101.5  PULMONARY A: #Baseline  - UIP CT June 2017 - Mulptiple Large GGO - unclear primary with abd adenopaty  #Now Acute Hypoxic Respiratory Failure - Unable to protect airway due to GIB  - did SBT but not ready for extubaiton  P:   Check autoimmune Full vent support. Wean as able. VAP prevention measures. SBT  When able. Diurese when hemodynamically stable.  RENAL A:   Acute Renal Failure  0- improved  P:   Trending UOP with Foley Monitoring electrolytes & renal function daily Repeat electrolytes at 1700 NS @ kvo Replacing electrolytes as indicated  ENDOCRINE A:   H/O DM H/O Hypothyroidism. Chronic Prednisone Therapy  P:   Accu-Checks  q4hr SSI per Low Dose Algorithm Synthroid IV Hydrocortisone IV q6hr  NEUROLOGIC A:   Acute Encephalopathy Sedation on Ventilator  P:   RASS goal: 0 to -1. Fentanyl gtt & IV prn Versed IV  prn Daily WUA.  IMMUNOLOGIC A: Polymalgia - on chronic prednisone.  P: Hydrocortisone IV q6hr  Family updated: Family at bedside and updated.7/;1/17  Interdisciplinary Family Meeting v Palliative Care Meeting:  Due by: 07/05.     The patient is critically ill with multiple organ systems failure and requires high complexity decision making for assessment and support, frequent evaluation and titration of therapies, application of advanced monitoring technologies and extensive interpretation of multiple databases.   Critical Care Time devoted to patient care services described in this note is  30  Minutes. This time reflects time of care of this signee Dr Brand Males. This critical care time does not reflect procedure time, or teaching time or supervisory time of PA/NP/Med student/Med Resident etc but could involve care discussion time    Dr. Brand Males, M.D., [facility].C.P Pulmonary and Critical Care Medicine Staff Physician [place] [facility] Pager: [phone], If no answer or between  15:00h - 7:00h: call [phone]  03/28/2016 2:02 PM

## 2016-03-28 NOTE — Progress Notes (Signed)
Order for Protonix bolus plus Protonix gtt verified by MD

## 2016-03-28 NOTE — Progress Notes (Signed)
Patient ID: Martha Mcbride, female   DOB: 1925/02/11, 82 y.o.   MRN: [id number] [facility] Gastroenterology Progress Note  Martha Mcbride 82 y.o. 11/16/1924   Subjective: Patient remains intubated; Bilious drainage from OG tube; brown stool per chart; Daughter and granddaughters at bedside  Objective: Vital signs in last 24 hours: Filed Vitals:   03/28/16 0900 03/28/16 1000  BP: 104/45 99/43  Pulse: 62 64  Temp: 96.6 F (35.9 C) 96.8 F (36 C)  Resp: 18 20    Physical Exam: Gen: intubated, elderly, frail CV: RRR Chest: Coarse breath sounds Abd: soft, nontender, nondistended, +BS Ext: no edema  Lab Results:  Recent Labs  03/27/16 0742 03/27/16 1508 03/28/16 0239  NA 133*  --  134*  K 5.1 4.5 4.3  CL 104  --  107  CO2 21*  --  21*  GLUCOSE 211*  --  130*  BUN 34*  --  26*  CREATININE 0.68  --  0.71  CALCIUM 8.2*  --  7.8*  MG 2.2  --   --   PHOS 3.7  --   --     Recent Labs  03/15/2016 1727 03/27/16 1508  AST 45* 28  ALT 18 14  ALKPHOS 104 73  BILITOT 1.4* 1.1  PROT 6.5 5.4*  ALBUMIN 2.4* 1.9*    Recent Labs  03/27/2016 1727  03/27/16 1028 03/27/16 1508 03/28/16 0239  WBC 24.4*  < > 20.9*  --  14.8*  NEUTROABS 12.7*  --   --   --  9.0*  HGB 11.3*  < > 10.9* 10.5* 9.7*  HCT 33.8*  < > 32.2* 31.1* 29.3*  MCV 91.4  < > 89.7  --  92.7  PLT 142*  < > 147*  --  123*  < > = values in this interval not displayed.  Recent Labs  03/02/2016 1727  LABPROT 16.4*  INR 1.31    Assessment/Plan: 82 yo s/p GI bleed likely from a Mallory Weiss tear without any signs of further bleeding. Hgb stable at 9.7. Would manage conservatively and hold off on an EGD unless signs of recurrent bleeding develop. Continue Protonix drip. Nutrition per CCM (hopefully she can be weaned from the ventilator in the near future and started on enteral nutrition). Will follow.   Martha C. 03/28/2016, 1:23 PM  Pager [phone]  If no answer or after 5 PM call [phone]

## 2016-03-28 DEATH — deceased

## 2016-03-29 ENCOUNTER — Inpatient Hospital Stay (HOSPITAL_COMMUNITY): Payer: Medicare Other

## 2016-03-29 LAB — BASIC METABOLIC PANEL
ANION GAP: 5 (ref 5–15)
BUN: 16 mg/dL (ref 6–20)
CALCIUM: 7.6 mg/dL — AB (ref 8.9–10.3)
CO2: 21 mmol/L — ABNORMAL LOW (ref 22–32)
Chloride: 109 mmol/L (ref 101–111)
Creatinine, Ser: 0.67 mg/dL (ref 0.44–1.00)
GLUCOSE: 147 mg/dL — AB (ref 65–99)
POTASSIUM: 3.8 mmol/L (ref 3.5–5.1)
Sodium: 135 mmol/L (ref 135–145)

## 2016-03-29 LAB — HEPATIC FUNCTION PANEL
ALT: 16 U/L (ref 14–54)
AST: 26 U/L (ref 15–41)
Albumin: 1.9 g/dL — ABNORMAL LOW (ref 3.5–5.0)
Alkaline Phosphatase: 76 U/L (ref 38–126)
BILIRUBIN DIRECT: 0.4 mg/dL (ref 0.1–0.5)
BILIRUBIN INDIRECT: 0.8 mg/dL (ref 0.3–0.9)
BILIRUBIN TOTAL: 1.2 mg/dL (ref 0.3–1.2)
Total Protein: 5.7 g/dL — ABNORMAL LOW (ref 6.5–8.1)

## 2016-03-29 LAB — CYCLIC CITRUL PEPTIDE ANTIBODY, IGG/IGA: CCP Antibodies IgG/IgA: 7 units (ref 0–19)

## 2016-03-29 LAB — GLUCOSE, CAPILLARY
GLUCOSE-CAPILLARY: 145 mg/dL — AB (ref 65–99)
GLUCOSE-CAPILLARY: 188 mg/dL — AB (ref 65–99)
Glucose-Capillary: 139 mg/dL — ABNORMAL HIGH (ref 65–99)
Glucose-Capillary: 159 mg/dL — ABNORMAL HIGH (ref 65–99)
Glucose-Capillary: 168 mg/dL — ABNORMAL HIGH (ref 65–99)
Glucose-Capillary: 224 mg/dL — ABNORMAL HIGH (ref 65–99)

## 2016-03-29 LAB — CBC WITH DIFFERENTIAL/PLATELET
BASOS ABS: 0.2 10*3/uL — AB (ref 0.0–0.1)
BASOS PCT: 1 %
EOS ABS: 0 10*3/uL (ref 0.0–0.7)
EOS PCT: 0 %
HCT: 29.9 % — ABNORMAL LOW (ref 36.0–46.0)
HEMOGLOBIN: 9.7 g/dL — AB (ref 12.0–15.0)
LYMPHS ABS: 3.3 10*3/uL (ref 0.7–4.0)
Lymphocytes Relative: 24 %
MCH: 30.8 pg (ref 26.0–34.0)
MCHC: 32.4 g/dL (ref 30.0–36.0)
MCV: 94.9 fL (ref 78.0–100.0)
Monocytes Absolute: 1.9 10*3/uL — ABNORMAL HIGH (ref 0.1–1.0)
Monocytes Relative: 13 %
NEUTROS PCT: 62 %
Neutro Abs: 8.7 10*3/uL — ABNORMAL HIGH (ref 1.7–7.7)
PLATELETS: 111 10*3/uL — AB (ref 150–400)
RBC: 3.15 MIL/uL — AB (ref 3.87–5.11)
RDW: 18.9 % — ABNORMAL HIGH (ref 11.5–15.5)
WBC: 14.1 10*3/uL — AB (ref 4.0–10.5)

## 2016-03-29 LAB — MAGNESIUM: MAGNESIUM: 1.8 mg/dL (ref 1.7–2.4)

## 2016-03-29 LAB — PROCALCITONIN: Procalcitonin: 0.16 ng/mL

## 2016-03-29 LAB — RHEUMATOID FACTOR: Rhuematoid fact SerPl-aCnc: 44 IU/mL — ABNORMAL HIGH (ref 0.0–13.9)

## 2016-03-29 LAB — PROTIME-INR
INR: 1.39 (ref 0.00–1.49)
PROTHROMBIN TIME: 17.2 s — AB (ref 11.6–15.2)

## 2016-03-29 LAB — TRIGLYCERIDES: TRIGLYCERIDES: 100 mg/dL (ref <150)

## 2016-03-29 LAB — PHOSPHORUS: PHOSPHORUS: 2.1 mg/dL — AB (ref 2.5–4.6)

## 2016-03-29 LAB — LACTIC ACID, PLASMA: LACTIC ACID, VENOUS: 2.3 mmol/L — AB (ref 0.5–1.9)

## 2016-03-29 MED ORDER — POTASSIUM PHOSPHATES 15 MMOLE/5ML IV SOLN
10.0000 mmol | Freq: Once | INTRAVENOUS | Status: AC
  Administered 2016-03-29: 10 mmol via INTRAVENOUS
  Filled 2016-03-29: qty 3.33

## 2016-03-29 MED ORDER — MAGNESIUM SULFATE 2 GM/50ML IV SOLN
2.0000 g | Freq: Once | INTRAVENOUS | Status: AC
  Administered 2016-03-29: 2 g via INTRAVENOUS
  Filled 2016-03-29: qty 50

## 2016-03-29 MED ORDER — DEXMEDETOMIDINE HCL IN NACL 200 MCG/50ML IV SOLN
0.4000 ug/kg/h | INTRAVENOUS | Status: DC
  Administered 2016-03-29: 0.4 ug/kg/h via INTRAVENOUS
  Administered 2016-03-29: 0.3 ug/kg/h via INTRAVENOUS
  Administered 2016-03-30: 0.4 ug/kg/h via INTRAVENOUS
  Filled 2016-03-29 (×5): qty 50

## 2016-03-29 NOTE — Progress Notes (Signed)
Interdisciplinary Goals of Care Family Meeting    Date carried out:: 03/29/2016  Location of the meeting: Bedside  Member's involved: Physician, Bedside Registered Nurse, Family Member or next of kin and Other: Dr Olena Leatherwood Power of Attorney or acting medical decision maker: daughters at bedside    Discussion: We discussed goals of care for Martha Mcbride .  Daughters x 2 and grandaughter present. THey are worried about long term. Discussion  1. Describign fatigue and eyes drooping end of day x 1 months - no prior dx of myasthenia -> check AchRBAb 2. Lot of questions on short term v long term ventilation and how to decide end point; definitely no trach and they want comfort if patient approaching vent dependency over several days  Code status: Limited Code or DNR with short term - full medical care but no ltac or trach or cpr  Disposition: Continue current acute care  Time spent for the meeting: 20 minutes  Harel Repetto 03/29/2016 11:02 AM

## 2016-03-29 NOTE — Progress Notes (Signed)
Patient ID: Martha Mcbride, female   DOB: [birth year], 82 y.o.   MRN: [id number] [facility] Gastroenterology Progress Note  RAELEEN MALY 82 y.o. Feb 11, 1925   Subjective: Intubated. Sedated. Family at bedside.  Objective: Vital signs in last 24 hours: Filed Vitals:   03/29/16 1430 03/29/16 1618  BP: 108/55 94/45  Pulse: 57 60  Temp: 99.3 F (37.4 C) 99 F (37.2 C)  Resp: 18 18    Physical Exam: Gen: intubated, sedated CV: RRR Chest: Coarse breath sounds Abd: soft, nondistended, nontender (no facial grimace), decreased bowel sounds  Lab Results:  Recent Labs  03/27/16 0742  03/28/16 0239 03/29/16 0234  NA 133*  --  134* 135  K 5.1  < > 4.3 3.8  CL 104  --  107 109  CO2 21*  --  21* 21*  GLUCOSE 211*  --  130* 147*  BUN 34*  --  26* 16  CREATININE 0.68  --  0.71 0.67  CALCIUM 8.2*  --  7.8* 7.6*  MG 2.2  --   --  1.8  PHOS 3.7  --   --  2.1*  < > = values in this interval not displayed.  Recent Labs  03/27/16 1508 03/29/16 0234  AST 28 26  ALT 14 16  ALKPHOS 73 76  BILITOT 1.1 1.2  PROT 5.4* 5.7*  ALBUMIN 1.9* 1.9*    Recent Labs  03/28/16 0239 03/29/16 0234  WBC 14.8* 14.1*  NEUTROABS 9.0* 8.7*  HGB 9.7* 9.7*  HCT 29.3* 29.9*  MCV 92.7 94.9  PLT 123* 111*    Recent Labs  03/08/2016 1727 03/29/16 0234  LABPROT 16.4* 17.2*  INR 1.31 1.39    Assessment/Plan: S/P GI bleed likely Mallory Weiss tear. Hgb stable and no evidence of further bleeding. Continue conservative management with Protonix drip. No plans for EGD. Gallstones with borderline wall thickening on U/S - LFTs normal. Would not recommend a HIDA scan with her current lack of responsiveness on the ventilator. I do not think she has cholecystitis and would manage her conservatively. Will sign off. Call us back if questions.   [place] C. 03/29/2016, 5:04 PM  Pager [phone]  If no answer or after 5 PM call [phone]

## 2016-03-29 NOTE — Progress Notes (Signed)
Pharmacy Antibiotic Note  Martha Mcbride is a 82 y.o. female admitted on 03/08/2016 with sepsis.  All cx neg/pending  Plan: Vancomycin 500mg  IV every 12 hours.  Goal trough 15-20 mcg/mL. Zosyn 3.375g IV q8h (4 hour infusion).  Consider d/c vanc  Height: 5\' 2"  (157.5 cm) Weight: 159 lb 13.3 oz (72.5 kg) IBW/kg (Calculated) : 50.1  Temp (24hrs), Avg:98.3 F (36.8 C), Min:96.8 F (36 C), Max:99.1 F (37.3 C)   Recent Labs Lab 03/08/2016 1727 03/25/2016 1740 03/07/2016 1741 03/05/2016 2215 03/27/16 0019 03/27/16 0742 03/27/16 1028 03/27/16 1508 03/28/16 0239 03/29/16 0230 03/29/16 0234  WBC 24.4*  --   --  22.2*  --   --  20.9*  --  14.8*  --  14.1*  CREATININE 1.03* 0.80  --   --   --  0.68  --   --  0.71  --  0.67  LATICACIDVEN  --   --  8.55* 3.8* 2.4*  --   --  2.1*  --  2.3*  --     Estimated Creatinine Clearance: 45.4 mL/min (by C-G formula based on Cr of 0.67).    Allergies  Allergen Reactions  . Adhesive [Tape] Other (See Comments)    Tears skin off, Please use "paper" tape  . Doxycycline Nausea And Vomiting  . Other Nausea And Vomiting and Other (See Comments)    Anesthesia makes her very sleepy and makes it hard for her to come out of it.   Marland Kitchen Penicillins Hives and Nausea And Vomiting    Has patient had a PCN reaction causing immediate rash, facial/tongue/throat swelling, SOB or lightheadedness with hypotension: Yes Has patient had a PCN reaction causing severe rash involving mucus membranes or skin necrosis: No Has patient had a PCN reaction that required hospitalization No Has patient had a PCN reaction occurring within the last 10 years: No If all of the above answers are "NO", then may proceed with Cephalosporin use.   . Shellfish Allergy Nausea And Vomiting  . Sulfa Antibiotics Nausea And Vomiting  . Sulfamethoxazole Nausea And Vomiting  . Meperidine Hives and Rash    Unknown  . Methotrexate Rash    Bleeding under the skin    Antimicrobials this  admission: Vanc 6/29 >>  Zosyn 6/29 >>   Dose adjustments this admission: n/a  Microbiology results: 6/29 Blood x 2 NGx2 6/29 Urine  6/30 Pending  Levester Fresh, PharmD, BCPS, [facility] Clinical Pharmacist Pager [phone] 03/29/2016 9:46 AM

## 2016-03-29 NOTE — Progress Notes (Signed)
   RN says on full vent suppor getting restless  Plan Start precedex Trying to avoid fent prn  Dr. Brand Males, M.D., [facility].C.P Pulmonary and Critical Care Medicine Staff Physician [place] [facility] and Critical [facility] Pager: [phone], If no answer or between  15:00h - 7:00h: call [phone]  03/29/2016 12:49 PM

## 2016-03-29 NOTE — Progress Notes (Signed)
PULMONARY / CRITICAL CARE MEDICINE   Name: Martha Mcbride MRN: [id number] DOB: 05-26-1925    ADMISSION DATE:  03/25/2016 CONSULTATION DATE:  03/20/2016  REFERRING MD:  Kathrynn Humble (EDP)  CHIEF COMPLAINT:  GI Bleed  BRIEF   Martha Mcbride is a 82 y.o. female with PMH as outlined below including ILD for which she is on nocturnal O2 and is followed by Dr. Elsworth Soho.  She [facility] to [facility] ED 06/29 with bloody emesis and black tarry stools.  Pt was reportedly constipated for several days prior so was given an enema.  She then had large BM consisting of melena followed by hematemesis. In ED, she was minimally responsive therefore was intubated for airway protection.  She was given 2u PRBC empirically and 3L IVF.  Initial Hgb 11.3, Hct 33.8. Post intubation, she was hypotensive; therefore, propofol was discontinued.  Dr. Paulita Fujita with [facility] was consulted by EDP. Pt is on daily ASA 81mg , no additional anti-platelets or anticoagulants. She had recent admission 03/07/16 through 03/09/16 for pneumonia and was on oxygen and prednisone at home at time of admission   has a past medical history of Arthritis; Polymyalgia ([place]); Acid reflux; Immune deficiency disorder ([place]); Breast cancer ([place]); Giardia; Pinworms; History of hiatal hernia; Pneumonia; Dementia; CHF (congestive heart failure) ([place]); Dysrhythmia; Diabetes mellitus without complication ([place]); and Aortic stenosis.  March 2014 - normal mammogram Oct 2015: Admissions - Oct 2015 of HCAP and encephalopathy and diast CHF. [place] AS Nov 2015 : CT with UIP  with hiatal hernia. Autoimmune negative. Also admittied for recc aspiration pna, severe sepsis and chf, Sacral ulers , sp peg Jan 2016: admit for sacral wounds, RAI -, Bialteral DVT - s/p IVC filter June 2017:  Seen by Dr Elsworth Soho in office - possible progression in UIP - consideration of IPF. New GGO 2cm in CT - PET/Biopsy discison July 2017 - [facility] with severe AS with valve area 0.5 and PASP 30mmHg.   EVENTS and  studies  6/29 - admitted with UGIB, intubated for airway protection.  CTA chest / abd / pelv 6/29 > pleural effusions with atelectasis, stable ILD, adenopathy of the chest abdomen and pelvis concerning for metastasis, moderate to large hiatal hernia, ascites in RUQ, cholelithiasis   03/27/16:  Patient remains critically oh on low-dose vasopressor support. Switching to peripheral Neo-Synephrine. . Ongoing family discussions once we have a better understanding of the etiology of the patient's acute decompensation.  6/30 indicates suspected Upper GI Bleed/ Mallory Weiss tear.with plans for EGD once patient has stabilized.She is a DNR with suspected breast cancer metastasis and previously stated she does not want aggressive care  03/28/16: did SBT but Rt says too weak to extubate. On neo and needing to go up on dose. Family @ bedside. WOC notes stage 2 pressure injur. On tube feeds and protonix gtt. GI notes rec holding off endoscopy til improved; feels no active bleeding    SUBJECTIVE/OVERNIGHT/INTERVAL HX 03/29/16 - does SBT but still too droiwsy (family report and chart rview confirms pior acute encephalopathy and highly senstive to sedation). Still on neo. No active bleeding. ON hydrocort as well. On protonix gtt. STarted TF yesterday. Fever resolved  VITAL SIGNS: BP 120/67 mmHg  Pulse 71  Temp(Src) 98.2 F (36.8 C) (Oral)  Resp 22  Ht 5\' 2"  (1.575 m)  Wt 72.5 kg (159 lb 13.3 oz)  BMI 29.23 kg/m2  SpO2 100%  HEMODYNAMICS:    VENTILATOR SETTINGS: Vent Mode:  [-] PSV FiO2 (%):  [40 %]  40 % Set Rate:  [20 bmp] 20 bmp Vt Set:  [420 mL] 420 mL PEEP:  [5 cmH20] 5 cmH20 Pressure Support:  [5 cmH20] 5 cmH20 Plateau Pressure:  [13 cmH20-18 cmH20] 15 cmH20  INTAKE / OUTPUT: I/O last 3 completed shifts: In: 8099.2 [I.V.:7106.7; NG/GT:480; IV Piggyback:512.5] Out: 3010 [Urine:2910; Emesis/NG output:100]   PHYSICAL EXAMINATION: General: Elderly chronically ill appearing female, critically  ill. VERY FRAIL and DECONDITIONEd Neuro: , RASs -2 on prn fent . Follows commands.  HEENT: [place]/AT. PERRL, sclerae anicteric. Cardiovascular: RRR, 3/6 SEM radiating into carotids.  Lungs: Respirations even and unlabored.  Coarse bilaterally. Abdomen: BS x 4, soft, NT/ND.  Musculoskeletal: No gross deformities, no edema. VERY FRAIL  Skin: Intact, warm, no rashes.  LABS: PULMONARY  Recent Labs Lab 03/11/2016 1740 03/07/2016 2026 03/27/16 0326  PHART  --  7.341* 7.345*  PCO2ART  --  39.2 40.5  PO2ART  --  137.0* 157.0*  HCO3  --  20.9 22.0  TCO2 19 22 23   O2SAT  --  99.0 99.0    CBC  Recent Labs Lab 03/27/16 1028 03/27/16 1508 03/28/16 0239 03/29/16 0234  HGB 10.9* 10.5* 9.7* 9.7*  HCT 32.2* 31.1* 29.3* 29.9*  WBC 20.9*  --  14.8* 14.1*  PLT 147*  --  123* 111*    COAGULATION  Recent Labs Lab 03/19/2016 1727 03/29/16 0234  INR 1.31 1.39    CARDIAC    Recent Labs Lab 03/25/2016 2214 03/27/16 0019  TROPONINI 0.17* 0.21*   No results for input(s): PROBNP in the last 168 hours.   CHEMISTRY  Recent Labs Lab 03/03/2016 1727 03/19/2016 1740 03/27/16 0742  03/28/16 0239 03/29/16 0234  NA 129* 127* 133*  --  134* 135  K 5.1 4.8 5.1  < > 4.3 3.8  CL 95* 98* 104  --  107 109  CO2 19*  --  21*  --  21* 21*  GLUCOSE 111* 109* 211*  --  130* 147*  BUN 45* 44* 34*  --  26* 16  CREATININE 1.03* 0.80 0.68  --  0.71 0.67  CALCIUM 10.0  --  8.2*  --  7.8* 7.6*  MG  --   --  2.2  --   --  1.8  PHOS  --   --  3.7  --   --  2.1*  < > = values in this interval not displayed. Estimated Creatinine Clearance: 45.4 mL/min (by C-G formula based on Cr of 0.67).   LIVER  Recent Labs Lab 03/18/2016 1727 03/27/16 1508 03/29/16 0234  AST 45* 28 26  ALT 18 14 16   ALKPHOS 104 73 76  BILITOT 1.4* 1.1 1.2  PROT 6.5 5.4* 5.7*  ALBUMIN 2.4* 1.9* 1.9*  INR 1.31  --  1.39     INFECTIOUS  Recent Labs Lab 03/27/16 0019 03/27/16 1508 03/28/16 1811 03/29/16 0230  03/29/16 0234  LATICACIDVEN 2.4* 2.1*  --  2.3*  --   PROCALCITON  --   --  0.25  --  0.16     ENDOCRINE CBG (last 3)   Recent Labs  03/29/16 03/29/16 0339 03/29/16 0817  GLUCAP 145* 139* 188*         IMAGING x48h  - image(s) personally visualized  -   highlighted in bold US Abdomen Port  03/27/2016  CLINICAL DATA:  Nausea and vomiting 4-5 days. EXAM: ABDOMEN ULTRASOUND COMPLETE COMPARISON:  CT 10/16/2014 FINDINGS: Gallbladder: Moderate cholelithiasis with several stones over the gallbladder neck. Largest  stone measures 1.1 cm. Borderline gallbladder wall thickening measuring 3 mm. Small amount of pericholecystic fluid. Negative sonographic Murphy sign. Common bile duct: Diameter: 3 mm. Liver: No focal lesion identified. Within normal limits in parenchymal echogenicity. IVC: No abnormality visualized. Pancreas: Not well visualized. Spleen: Size and appearance within normal limits. Right Kidney: Length: 9.9 cm. Echogenicity within normal limits. No mass or hydronephrosis visualized. Left Kidney: Length: 11.0 cm. Echogenicity within normal limits. No mass or hydronephrosis visualized. Abdominal aorta: No aneurysm visualized. Other findings: Small right pleural effusion. IMPRESSION: Moderate cholelithiasis with borderline wall thickening. Negative sonographic Murphy sign and tiny amount of pericholecystic fluid. Recommend clinical correlation for acute cholecystitis. HIDA scan may be helpful for further evaluation. Electronically Signed   By: Marin Olp M.D.   On: 03/27/2016 21:19   Dg Chest Port 1 View  03/29/2016  CLINICAL DATA:  Check endotracheal tube placement EXAM: PORTABLE CHEST 1 VIEW COMPARISON:  03/28/2016 FINDINGS: Endotracheal tube and nasogastric catheter are again seen in satisfactory position. Cardiac shadow is mildly enlarged. Mild patchy infiltrates are seen. Aortic calcifications are again noted and stable. IMPRESSION: Stable appearance of the chest when compared with the  prior exam. Electronically Signed   By: Inez Catalina M.D.   On: 03/29/2016 08:29   Dg Chest Port 1 View  03/28/2016  CLINICAL DATA:  Respiratory failure EXAM: PORTABLE CHEST 1 VIEW COMPARISON:  03/27/2016 FINDINGS: Nasogastric catheter is noted within the stomach. Endotracheal tube is seen in satisfactory position. Aortic calcifications are again noted as are mitral annular calcifications. Diffuse bilateral infiltrative densities are again seen and stable. No new focal abnormality is seen. IMPRESSION: Stable appearance of the chest when compare with the prior exam. Aortic atherosclerosis Electronically Signed   By: Inez Catalina M.D.   On: 03/28/2016 07:39       DISCUSSION: 82 y.o. F admitted 6/29 with hemorrhagic shock due to UGIB and required intubation in ED for airway protection.  GI consulted by EDP . Note .She is currently unresponsive on the ventilator. CXR >> 6/30  indicates bilateral infiltrates vs pulmonary edema, pressor dependent. Will need to have discussion with family regarding realistic goals of care versus continued escalation of care.If she cannot be liberated from life support, progress to comfort care.  ASSESSMENT / PLAN:  GASTROINTESTINAL A:   UGIB  H/O GERD Cholelithiasis - On CT imaging at admit 03/02/2016 and [place] 03/27/16 Large Hiatal hernia - chronic on imaging   - 03/29/16 - does not seem to have active bleding. GI advising continued conservative Rx [place] with gall stones - no coment from GI about HIDAA  P:   GI following , appreciate the assistance.- will ask HIDA need clarification Continue PPI gtt per GI. EGD when stable per GI possibley after extubation  TF to continue   HEMATOLOGIC / ONCOLOGIC A:   Anemia - Secondary to UGIB. Leukocytosis - Improving. Likely stress response vs sepsis. Adenopathy - Seen on CT. H/O Breast Cancer  - no active blled  P:  Trend cell counts daily w/ CBC Transfuse for Hgb < 7. Repeat Hgb/Hct at  1700 SCD's   CARDIOVASCULAR A:  Shock - Hemorrhagic due to UGIB. H/O CHF (Echo from 03/17/16 with EF 60-65%, severe aortic stenosis) H/O Severe Aortic Stenosis  - needing neo and on hydrocort/ ECHO 03/28/16 - similar to June 2017  P:  Monitor on telemetry Vitals per unit protocol  Neo-Synephrine to maintain MAP >65 & SBP >90 Cut down fluid to kvo  INFECTIOUS MRSA  PCR 6/29:  Negative Blood Ctx x2 6/29 > Urine Ctx 6/29 > Trach Asp Ctx 6/30 >  A:   Sepsis - Possible intra-abdominal source.   unclear if septic but was febrile and responded to abx. Culture negative as of 03/29/16  P:   ANTIBIOTICS: Vancomycin 6/29 >03/29/16 Zosyn 6/29 >  Check PCT and decide on dc abx Abx as above (vanc / zosyn).  Follow cultures as above. Re-culture for Temp >101.5  PULMONARY A: #Baseline  - UIP CT Nov 2015 and worse June 2017. Autoimmune neg 2015.  Likely IPF per [facility] June 2017 - Mulptiple Large GGO - new June 2017 - unclear primary with abd adenopaty  #Now Acute Hypoxic Respiratory Failure - Unable to protect airway due to GIB  - did SBT but not ready for extubaiton due to fraility and drowsiness  P:   AWait repeat autoimmune 03/29/16 Full vent support. Wean as able. VAP prevention measures. SBT  When able. Diurese when hemodynamically stable.  RENAL A:   Acute Renal Failure  0-resolved  72/17 - mild hypomag and hypophos  P:   Trending UOP with Foley Replete mag and phos Monitoring electrolytes & renal function daily NS @ kvo Replacing electrolytes as indicated  ENDOCRINE A:   H/O DM H/O Hypothyroidism. Chronic Prednisone Therapy  P:   Accu-Checks q4hr SSI per Low Dose Algorithm Synthroid IV Hydrocortisone IV q6hr  NEUROLOGIC A:   Acute Encephalopathy Sedation on Ventilator   - hx of prior delirium in hospital and easy sensitivity to fent   P:   Dc fent prn If needed use pecedex or haldol RASS goal: 0 to -1. Daily WUA.  IMMUNOLOGIC A: Polymalgia  - on chronic prednisone.  P: Hydrocortisone IV q6hr  Family updated: Family at bedside and updated.7/;1/17. None bedside 03/29/16  Interdisciplinary Family Meeting v Palliative Care Meeting:  Due by: 07/05.  GLOBAL -  Prior ICU illness with delirium and sacaral soers, underlying IPF, age 89 and ongoing fraility and pressor need all to weigh inon prognosis    The patient is critically ill with multiple organ systems failure and requires high complexity decision making for assessment and support, frequent evaluation and titration of therapies, application of advanced monitoring technologies and extensive interpretation of multiple databases.   Critical Care Time devoted to patient care services described in this note is  30  Minutes. This time reflects time of care of this signee Dr Brand Males. This critical care time does not reflect procedure time, or teaching time or supervisory time of PA/NP/Med student/Med Resident etc but could involve care discussion time    Dr. Brand Males, M.D., [facility].C.P Pulmonary and Critical Care Medicine Staff Physician [place] Pulmonary and Critical Care Pager: [phone], If no answer or between  15:00h - 7:00h: call [phone]  03/29/2016 9:38 AM

## 2016-03-30 ENCOUNTER — Inpatient Hospital Stay (HOSPITAL_COMMUNITY): Payer: Medicare Other

## 2016-03-30 DIAGNOSIS — J189 Pneumonia, unspecified organism: Secondary | ICD-10-CM | POA: Insufficient documentation

## 2016-03-30 DIAGNOSIS — J810 Acute pulmonary edema: Secondary | ICD-10-CM

## 2016-03-30 DIAGNOSIS — K921 Melena: Secondary | ICD-10-CM

## 2016-03-30 LAB — CBC WITH DIFFERENTIAL/PLATELET
Basophils Absolute: 0.2 10*3/uL — ABNORMAL HIGH (ref 0.0–0.1)
Basophils Relative: 2 %
EOS ABS: 0.2 10*3/uL (ref 0.0–0.7)
EOS PCT: 2 %
HEMATOCRIT: 30.4 % — AB (ref 36.0–46.0)
Hemoglobin: 9.8 g/dL — ABNORMAL LOW (ref 12.0–15.0)
LYMPHS ABS: 3.4 10*3/uL (ref 0.7–4.0)
Lymphocytes Relative: 32 %
MCH: 30.3 pg (ref 26.0–34.0)
MCHC: 32.2 g/dL (ref 30.0–36.0)
MCV: 94.1 fL (ref 78.0–100.0)
MONO ABS: 1.5 10*3/uL — AB (ref 0.1–1.0)
Monocytes Relative: 14 %
NEUTROS PCT: 50 %
Neutro Abs: 5.2 10*3/uL (ref 1.7–7.7)
PLATELETS: 99 10*3/uL — AB (ref 150–400)
RBC: 3.23 MIL/uL — AB (ref 3.87–5.11)
RDW: 19.4 % — AB (ref 11.5–15.5)
WBC: 10.5 10*3/uL (ref 4.0–10.5)

## 2016-03-30 LAB — ANTI-SCLERODERMA ANTIBODY

## 2016-03-30 LAB — CULTURE, RESPIRATORY W GRAM STAIN

## 2016-03-30 LAB — TRIGLYCERIDES: TRIGLYCERIDES: 85 mg/dL (ref <150)

## 2016-03-30 LAB — GLUCOSE, CAPILLARY
GLUCOSE-CAPILLARY: 157 mg/dL — AB (ref 65–99)
GLUCOSE-CAPILLARY: 159 mg/dL — AB (ref 65–99)
GLUCOSE-CAPILLARY: 166 mg/dL — AB (ref 65–99)
Glucose-Capillary: 181 mg/dL — ABNORMAL HIGH (ref 65–99)
Glucose-Capillary: 146 mg/dL — ABNORMAL HIGH (ref 65–99)

## 2016-03-30 LAB — SJOGRENS SYNDROME-A EXTRACTABLE NUCLEAR ANTIBODY: SSA (Ro) (ENA) Antibody, IgG: <0.2 AI (ref 0.0–0.9)

## 2016-03-30 LAB — BASIC METABOLIC PANEL
ANION GAP: 16 — AB (ref 5–15)
BUN: 17 mg/dL (ref 6–20)
CALCIUM: 8.9 mg/dL (ref 8.9–10.3)
CO2: 20 mmol/L — ABNORMAL LOW (ref 22–32)
Chloride: 105 mmol/L (ref 101–111)
Creatinine, Ser: 0.7 mg/dL (ref 0.44–1.00)
GLUCOSE: 162 mg/dL — AB (ref 65–99)
POTASSIUM: 3.8 mmol/L (ref 3.5–5.1)
SODIUM: 141 mmol/L (ref 135–145)

## 2016-03-30 LAB — SJOGRENS SYNDROME-B EXTRACTABLE NUCLEAR ANTIBODY

## 2016-03-30 LAB — PHOSPHORUS
PHOSPHORUS: 1.9 mg/dL — AB (ref 2.5–4.6)
Phosphorus: 2.5 mg/dL (ref 2.5–4.6)

## 2016-03-30 LAB — CULTURE, RESPIRATORY

## 2016-03-30 LAB — MAGNESIUM: Magnesium: 2 mg/dL (ref 1.7–2.4)

## 2016-03-30 LAB — PROCALCITONIN

## 2016-03-30 MED ORDER — FENTANYL CITRATE (PF) 100 MCG/2ML IJ SOLN
50.0000 ug | INTRAMUSCULAR | Status: DC | PRN
  Administered 2016-03-30 – 2016-04-02 (×2): 50 ug via INTRAVENOUS
  Filled 2016-03-30 (×3): qty 2

## 2016-03-30 MED ORDER — POTASSIUM PHOSPHATES 15 MMOLE/5ML IV SOLN
10.0000 mmol | Freq: Once | INTRAVENOUS | Status: DC
  Filled 2016-03-30: qty 3.33

## 2016-03-30 MED ORDER — MIDAZOLAM HCL 2 MG/2ML IJ SOLN: 1.0000 mg | INTRAMUSCULAR | Status: DC | PRN

## 2016-03-30 MED ORDER — POTASSIUM PHOSPHATES 15 MMOLE/5ML IV SOLN
30.0000 mmol | Freq: Once | INTRAVENOUS | Status: AC
  Administered 2016-03-30: 30 mmol via INTRAVENOUS
  Filled 2016-03-30: qty 10

## 2016-03-30 MED ORDER — MIDAZOLAM HCL 2 MG/2ML IJ SOLN
1.0000 mg | INTRAMUSCULAR | Status: DC | PRN
  Administered 2016-04-02: 1 mg via INTRAVENOUS
  Filled 2016-03-30: qty 2

## 2016-03-30 MED ORDER — FUROSEMIDE 10 MG/ML IJ SOLN
20.0000 mg | Freq: Once | INTRAMUSCULAR | Status: AC
  Administered 2016-03-30: 20 mg via INTRAVENOUS
  Filled 2016-03-30 (×2): qty 2

## 2016-03-30 MED ORDER — FENTANYL CITRATE (PF) 100 MCG/2ML IJ SOLN
50.0000 ug | INTRAMUSCULAR | Status: DC | PRN
Start: 2016-03-30 — End: 2016-04-02
  Administered 2016-03-30 – 2016-04-02 (×12): 50 ug via INTRAVENOUS
  Filled 2016-03-30 (×11): qty 2

## 2016-03-30 MED ORDER — PANTOPRAZOLE SODIUM 40 MG PO PACK
40.0000 mg | PACK | Freq: Two times a day (BID) | ORAL | Status: DC
  Administered 2016-03-30 – 2016-04-02 (×7): 40 mg
  Filled 2016-03-30 (×8): qty 20

## 2016-03-30 NOTE — Progress Notes (Signed)
50cc of Fentanyl drip 2,500/250 ml concentration wasted in sink, verified with nurse Caryl Pina

## 2016-03-30 NOTE — Care Management Important Message (Signed)
Important Message  Patient Details  Name: Martha Mcbride MRN: [id number] Date of Birth: 01-21-1925   Medicare Important Message Given:  Other (see comment)    [employer], Luvinia Lucy Abena 03/30/2016, 10:26 AM

## 2016-03-30 NOTE — Progress Notes (Signed)
PULMONARY / CRITICAL CARE MEDICINE   Name: Martha Mcbride MRN: [id number] DOB: 06/03/1925    ADMISSION DATE:  02/28/2016 CONSULTATION DATE:  03/10/2016  REFERRING MD:  Kathrynn Humble (EDP)  CHIEF COMPLAINT:  GI Bleed  BRIEF   Martha Mcbride is a 82 y.o. female with PMH as outlined below including ILD for which she is on nocturnal O2 and is followed by Dr. Elsworth Soho.  She presented to [facility] ED 06/29 with bloody emesis and black tarry stools.  Pt was reportedly constipated for several days prior so was given an enema.  She then had large BM consisting of melena followed by hematemesis. In ED, she was minimally responsive therefore was intubated for airway protection.  She was given 2u PRBC empirically and 3L IVF.  Initial Hgb 11.3, Hct 33.8. Post intubation, she was hypotensive; therefore, propofol was discontinued.  Dr. Paulita Fujita with [facility] was consulted by EDP. Pt is on daily ASA 81mg , no additional anti-platelets or anticoagulants. She had recent admission 03/07/16 through 03/09/16 for pneumonia and was on oxygen and prednisone at home at time of admission   has a past medical history of Arthritis; Polymyalgia ([place]); Acid reflux; Immune deficiency disorder ([place]); Breast cancer ([place]); Giardia; Pinworms; History of hiatal hernia; Pneumonia; Dementia; CHF (congestive heart failure) ([place]); Dysrhythmia; Diabetes mellitus without complication ([place]); and Aortic stenosis.  March 2014 - normal mammogram Oct 2015: Admissions - Oct 2015 of HCAP and encephalopathy and diast CHF. Mode[place] AS Nov 2015 : CT with UIP  with hiatal hernia. Autoimmune negative. Also admittied for recc aspiration pna, severe sepsis and chf, Sacral ulers , sp peg Jan 2016: admit for sacral wounds, RAI -, Bialteral DVT - s/p IVC filter June 2017:  Seen by Dr Elsworth Soho in office - possible progression in UIP - consideration of IPF. New GGO 2cm in CT - PET/Biopsy discison July 2017 - [facility] with severe AS with valve area 0.5 and PASP 49mmHg.   EVENTS and  studies  6/29 - admitted with UGIB, intubated for airway protection.  CTA chest / abd / pelv 6/29 > pleural effusions with atelectasis, stable ILD, adenopathy of the chest abdomen and pelvis concerning for metastasis, moderate to large hiatal hernia, ascites in RUQ, cholelithiasis   03/27/16:  Patient remains critically oh on low-dose vasopressor support. Switching to peripheral Neo-Synephrine. . Ongoing family discussions once we have a better understanding of the etiology of the patient's acute decompensation.  6/30 indicates suspected Upper GI Bleed/ Mallory Weiss tear.with plans for EGD once patient has stabilized.She is a DNR with suspected breast cancer metastasis and previously stated she does not want aggressive care  03/28/16: did SBT but Rt says too weak to extubate. On neo and needing to go up on dose. Family @ bedside. WOC notes stage 2 pressure injur. On tube feeds and protonix gtt. GI notes rec holding off endoscopy til improved; feels no active bleeding    SUBJECTIVE/OVERNIGHT/INTERVAL HX Did PST this am > with increased WOB and tachypneic. Back on full support.   VITAL SIGNS: BP 117/62 mmHg  Pulse 92  Temp(Src) 99.5 F (37.5 C) (Oral)  Resp 34  Ht 5\' 2"  (1.575 m)  Wt 73.6 kg (162 lb 4.1 oz)  BMI 29.67 kg/m2  SpO2 99%  HEMODYNAMICS:    VENTILATOR SETTINGS: Vent Mode:  [-] PSV;CPAP FiO2 (%):  [40 %] 40 % Set Rate:  [20 bmp] 20 bmp Vt Set:  [420 mL] 420 mL PEEP:  [5 cmH20] 5 cmH20 Pressure Support:  [  [place] Pressure:  [8 [id number] cmH20] 24 cmH20  INTAKE / OUTPUT: I/O last 3 completed shifts: In: 4879.3 [I.V.:2502.8; NG/GT:1795; IV Piggyback:581.5] Out: 2160 [Urine:2160]   PHYSICAL EXAMINATION: General: Elderly chronically ill appearing female, critically ill. VERY FRAIL and DECONDITIONED. Neuro: CN grossly intact. Sedated. (-) lateralizing signs seen.  HEENT: [place]/AT. PERRL, sclerae anicteric. Cardiovascular: RRR, 3/6 SEM radiating into  carotids.  Lungs: Respirations even and unlabored.  Coarse bilaterally. Crackles at bases.  Abdomen: BS x 4, soft, NT/ND.  Musculoskeletal: No gross deformities, no edema. VERY FRAIL  Skin: Intact, warm, no rashes.  LABS: PULMONARY  Recent Labs Lab 03/16/2016 1740 03/03/2016 2026 03/27/16 0326  PHART  --  7.341* 7.345*  PCO2ART  --  39.2 40.5  PO2ART  --  137.0* 157.0*  HCO3  --  20.9 22.0  TCO2 19 22 23   O2SAT  --  99.0 99.0    CBC  Recent Labs Lab 03/28/16 0239 03/29/16 0234 03/30/16 0219  HGB 9.7* 9.7* 9.8*  HCT 29.3* 29.9* 30.4*  WBC 14.8* 14.1* 10.5  PLT 123* 111* 99*    COAGULATION  Recent Labs Lab 03/24/2016 1727 03/29/16 0234  INR 1.31 1.39    CARDIAC    Recent Labs Lab 03/16/2016 2214 03/27/16 0019  TROPONINI 0.17* 0.21*   No results for input(s): PROBNP in the last 168 hours.   CHEMISTRY  Recent Labs Lab 03/05/2016 1727 03/21/2016 1740 03/27/16 0742  03/28/16 0239 03/29/16 0234 03/30/16 0219  NA 129* 127* 133*  --  134* 135 141  K 5.1 4.8 5.1  < > 4.3 3.8 3.8  CL 95* 98* 104  --  107 109 105  CO2 19*  --  21*  --  21* 21* 20*  GLUCOSE 111* 109* 211*  --  130* 147* 162*  BUN 45* 44* 34*  --  26* 16 17  CREATININE 1.03* 0.80 0.68  --  0.71 0.67 0.70  CALCIUM 10.0  --  8.2*  --  7.8* 7.6* 8.9  MG  --   --  2.2  --   --  1.8 2.0  PHOS  --   --  3.7  --   --  2.1* 1.9*  < > = values in this interval not displayed. Estimated Creatinine Clearance: 45.7 mL/min (by C-G formula based on Cr of 0.7).   LIVER  Recent Labs Lab 03/11/2016 1727 03/27/16 1508 03/29/16 0234  AST 45* 28 26  ALT 18 14 16   ALKPHOS 104 73 76  BILITOT 1.4* 1.1 1.2  PROT 6.5 5.4* 5.7*  ALBUMIN 2.4* 1.9* 1.9*  INR 1.31  --  1.39     INFECTIOUS  Recent Labs Lab 03/27/16 0019 03/27/16 1508 03/28/16 1811 03/29/16 0230 03/29/16 0234 03/30/16 0219  LATICACIDVEN 2.4* 2.1*  --  2.3*  --   --   PROCALCITON  --   --  0.25  --  0.16 <0.10     ENDOCRINE CBG  (last 3)   Recent Labs  03/30/16 0013 03/30/16 0413 03/30/16 0751  GLUCAP 157* 159* 181*         IMAGING x48h  - image(s) personally visualized  -   highlighted in bold Dg Chest Port 1 View  03/30/2016  CLINICAL DATA:  82 year old female with respiratory failure, circulatory shock, GI bleed. Initial encounter. EXAM: PORTABLE CHEST 1 VIEW COMPARISON:  03/29/2016 and earlier FINDINGS: Portable AP semi upright view at 0518 hours. Stable endotracheal tube. Enteric tube remains in place,  side hole to level of the gastric fundus. Right abdomen IVC filter re- identified. Increased indistinct bilateral hilar and perihilar opacity since 03/27/2016. Associated increased patchy right upper lobe opacity. No superimposed pneumothorax or definite pleural effusion. Stable mediastinal contours, hiatal hernia demonstrated on CT 03/06/2016. Calcified aortic atherosclerosis. Small right chest wall surgical clips are stable. No acute osseous abnormality identified. IMPRESSION: 1.  Stable lines and tubes. 2. Increasing bilateral hilar and perihilar opacity is nonspecific. Top differential considerations in this setting include worsening bilateral pulmonary infection, acute pulmonary edema, and increasing mediastinal lymphadenopathy with atelectasis. Electronically Signed   By: Genevie Ann M.D.   On: 03/30/2016 07:22   Dg Chest Port 1 View  03/29/2016  CLINICAL DATA:  Check endotracheal tube placement EXAM: PORTABLE CHEST 1 VIEW COMPARISON:  03/28/2016 FINDINGS: Endotracheal tube and nasogastric catheter are again seen in satisfactory position. Cardiac shadow is mildly enlarged. Mild patchy infiltrates are seen. Aortic calcifications are again noted and stable. IMPRESSION: Stable appearance of the chest when compared with the prior exam. Electronically Signed   By: Inez Catalina M.D.   On: 03/29/2016 08:29       DISCUSSION: 82 y.o. F admitted 6/29 with hemorrhagic shock due to UGIB and required intubation in ED for  airway protection.  GI consulted by EDP . Note .She is currently unresponsive on the ventilator. CXR >> 6/30  indicates bilateral infiltrates vs pulmonary edema, pressor dependent. Will need to have discussion with family regarding realistic goals of care versus continued escalation of care.If she cannot be liberated from life support, progress to comfort care.  ASSESSMENT / PLAN:  GASTROINTESTINAL A:   UGIB. No active bleeding now.  H/O GERD Cholelithiasis - On CT imaging at admit 03/07/2016 and [place] 03/27/16 Large Hiatal hernia - chronic on imaging  P:   Will d/c protonix drip and start PO PPI EGD when stable per GI possibley after extubation TF to continue   HEMATOLOGIC / ONCOLOGIC A:   Anemia - Secondary to UGIB. Leukocytosis - Improving. Likely stress response vs sepsis. Adenopathy - Seen on CT. H/O Breast Cancer  P:  Trend cell counts daily w/ CBC Transfuse for Hgb < 7. Repeat Hgb/Hct at 1700 SCD's   CARDIOVASCULAR A:  Shock - Hemorrhagic due to UGIB. H/O CHF (Echo from 03/17/16 with EF 60-65%, severe aortic stenosis) H/O Severe Aortic Stenosis  P:  Monitor on telemetry Vitals per unit protocol Neo-Synephrine to maintain MAP >65 & SBP >90 Gentle diuresis 2/2 severe AS.   INFECTIOUS MRSA PCR 6/29:  Negative Blood Ctx x2 6/29 > Urine Ctx 6/29 > Trach Asp Ctx 6/30 >  A:   Sepsis - Possible intra abdominal source. Possible HCAP R > L. CXR on 7/3 worse infiltrates R>L.   P:   Continue Abx for now. PCT is better. Deescalate in 1-2 days.  ANTIBIOTICS: Vancomycin 6/29 >03/29/16 Zosyn 6/29 >   PULMONARY A: Acute Hypoxemic respiratory failure 2/2 Volume overload/Pulmonary edema/possible HCAP R > L/Unable to protect airway 2/2 UGIB. Has ILD/IPF. Mulptiple Large GGO - new June 2017 - unclear primary with abd adenopaty  P:   Await repeat autoimmune 03/29/16 Full vent support. Wean as able. Not ready today. With inc WOB during PST.  VAP prevention measures. SBT   When able. Diurese as pt is volume overloaded.   RENAL A:   Acute Renal Failure- resolved Volume overload    P:   Trending UOP with Foley Replete mag and phos Monitoring electrolytes & renal function daily  Diuresce gently as pt with severe AS and anasarca  ENDOCRINE A:   H/O DM H/O Hypothyroidism. Chronic Prednisone Therapy  P:   Accu-Checks q4hr SSI per Low Dose Algorithm Synthroid IV Hydrocortisone IV q6hr  NEUROLOGIC A:   Acute Encephalopathy Sedation on Ventilator  P:   D/C precedex.  She was too sedated on fentanyl drip. Try  Fentanyl pushes and versed pushes.  RASS goal: 0 to -1. Daily WUA.  IMMUNOLOGIC A: Polymalgia - on chronic prednisone.  P: Hydrocortisone IV q6hr  Family updated: Family at bedside and updated on 7/3.   Interdisciplinary Family Meeting v Palliative Care Meeting:  Due by: 07/05.  Critical care time with this patient today : 35 minutes  J. Shirl Harris, MD 03/30/2016, 9:51 AM [place] Pager [phone] After 3 pm or if no answer, call [phone]     03/30/2016 9:49 AM

## 2016-03-30 NOTE — Progress Notes (Signed)
PULMONARY / CRITICAL CARE MEDICINE   Name: Martha Mcbride MRN: [id number] DOB: 1925-06-03    ADMISSION DATE:  03/02/2016 CONSULTATION DATE:  03/14/2016  REFERRING MD:  Kathrynn Humble (EDP)  CHIEF COMPLAINT:  GI Bleed  BRIEF   Martha Mcbride is a 82 y.o. female with PMH as outlined below including ILD for which she is on nocturnal O2 and is followed by Dr. Elsworth Soho.  She presented to [facility] ED 06/29 with bloody emesis and black tarry stools.  Pt was reportedly constipated for several days prior so was given an enema.  She then had large BM consisting of melena followed by hematemesis. In ED, she was minimally responsive therefore was intubated for airway protection.  She was given 2u PRBC empirically and 3L IVF.  Initial Hgb 11.3, Hct 33.8. Post intubation, she was hypotensive; therefore, propofol was discontinued.  Dr. Paulita Fujita with [facility] was consulted by EDP. Pt is on daily ASA 81mg , no additional anti-platelets or anticoagulants. She had recent admission 03/07/16 through 03/09/16 for pneumonia and was on oxygen and prednisone at home at time of admission   has a past medical history of Arthritis; Polymyalgia ([place]); Acid reflux; Immune deficiency disorder ([place]); Breast cancer ([place]); Giardia; Pinworms; History of hiatal hernia; Pneumonia; Dementia; CHF (congestive heart failure) ([place]); Dysrhythmia; Diabetes mellitus without complication ([place]); and Aortic stenosis.  March 2014 - normal mammogram Oct 2015: Admissions - Oct 2015 of HCAP and encephalopathy and diast CHF. Mode[place] AS Nov 2015 : CT with UIP  with hiatal hernia. Autoimmune negative. Also admittied for recc aspiration pna, severe sepsis and chf, Sacral ulers , sp peg Jan 2016: admit for sacral wounds, RAI -, Bialteral DVT - s/p IVC filter June 2017:  Seen by Dr Elsworth Soho in office - possible progression in UIP - consideration of IPF. New GGO 2cm in CT - PET/Biopsy discison July 2017 - [facility] with severe AS with valve area 0.5 and PASP 46mmHg.   EVENTS and  studies  6/29 - admitted with UGIB, intubated for airway protection.  CTA chest / abd / pelv 6/29 > pleural effusions with atelectasis, stable ILD, adenopathy of the chest abdomen and pelvis concerning for metastasis, moderate to large hiatal hernia, ascites in RUQ, cholelithiasis   03/27/16:  Patient remains critically oh on low-dose vasopressor support. Switching to peripheral Neo-Synephrine. . Ongoing family discussions once we have a better understanding of the etiology of the patient's acute decompensation.  6/30 indicates suspected Upper GI Bleed/ Mallory Weiss tear.with plans for EGD once patient has stabilized.She is a DNR with suspected breast cancer metastasis and previously stated she does not want aggressive care  03/28/16: did SBT but Rt says too weak to extubate. On neo and needing to go up on dose. Family @ bedside. WOC notes stage 2 pressure injur. On tube feeds and protonix gtt. GI notes rec holding off endoscopy til improved; feels no active bleeding  03/29/16: Started on Precedex for agitation.   SUBJECTIVE/OVERNIGHT/INTERVAL HX Tried to wean vent settings yesterday, but unsuccessful, now back on full pressure support. Still on Neo and Hydrocort. No active bleeding. Started tube feeds 7/2.  VITAL SIGNS: BP 100/48 mmHg  Pulse 60  Temp(Src) 99.5 F (37.5 C) (Oral)  Resp 23  Ht 5\' 2"  (1.575 m)  Wt 73.6 kg (162 lb 4.1 oz)  BMI 29.67 kg/m2  SpO2 100%  HEMODYNAMICS:    VENTILATOR SETTINGS: Vent Mode:  [-] PSV;CPAP FiO2 (%):  [40 %] 40 % Set Rate:  [20 bmp] 20  bmp Vt Set:  [420 mL] 420 mL PEEP:  [5 cmH20] 5 cmH20 Pressure Support:  [5 cmH20] 5 cmH20 Plateau Pressure:  [8 [id number] cmH20] 24 cmH20  INTAKE / OUTPUT: I/O last 3 completed shifts: In: 4879.3 [I.V.:2502.8; NG/GT:1795; IV Piggyback:581.5] Out: 2160 [Urine:2160]   PHYSICAL EXAMINATION:  General: Elderly chronically ill appearing female, very frail-appearing Neuro: , RASs -2 on prn fent .  Unresponsive. HEENT: [place]/AT. PERRL, sclerae anicteric. Cardiovascular: RRR, 3/6 SEM radiating into carotids.  Lungs: Respirations mildly labored.  Coarse bilaterally. Abdomen: BS x 4, soft, NT/ND.  Musculoskeletal: No gross deformities, 1+ pitting edema in feet bilaterally, SCDs in place. Skin: Intact, warm, no rashes.  LABS: PULMONARY  Recent Labs Lab 03/14/2016 1740 03/22/2016 2026 03/27/16 0326  PHART  --  7.341* 7.345*  PCO2ART  --  39.2 40.5  PO2ART  --  137.0* 157.0*  HCO3  --  20.9 22.0  TCO2 19 22 23   O2SAT  --  99.0 99.0    CBC  Recent Labs Lab 03/28/16 0239 03/29/16 0234 03/30/16 0219  HGB 9.7* 9.7* 9.8*  HCT 29.3* 29.9* 30.4*  WBC 14.8* 14.1* 10.5  PLT 123* 111* 99*    COAGULATION  Recent Labs Lab 03/07/2016 1727 03/29/16 0234  INR 1.31 1.39    CARDIAC    Recent Labs Lab 03/01/2016 2214 03/27/16 0019  TROPONINI 0.17* 0.21*   No results for input(s): PROBNP in the last 168 hours.   CHEMISTRY  Recent Labs Lab 03/02/2016 1727 03/25/2016 1740 03/27/16 0742  03/28/16 0239 03/29/16 0234 03/30/16 0219  NA 129* 127* 133*  --  134* 135 141  K 5.1 4.8 5.1  < > 4.3 3.8 3.8  CL 95* 98* 104  --  107 109 105  CO2 19*  --  21*  --  21* 21* 20*  GLUCOSE 111* 109* 211*  --  130* 147* 162*  BUN 45* 44* 34*  --  26* 16 17  CREATININE 1.03* 0.80 0.68  --  0.71 0.67 0.70  CALCIUM 10.0  --  8.2*  --  7.8* 7.6* 8.9  MG  --   --  2.2  --   --  1.8 2.0  PHOS  --   --  3.7  --   --  2.1* 1.9*  < > = values in this interval not displayed. Estimated Creatinine Clearance: 45.7 mL/min (by C-G formula based on Cr of 0.7).   LIVER  Recent Labs Lab 03/16/2016 1727 03/27/16 1508 03/29/16 0234  AST 45* 28 26  ALT 18 14 16   ALKPHOS 104 73 76  BILITOT 1.4* 1.1 1.2  PROT 6.5 5.4* 5.7*  ALBUMIN 2.4* 1.9* 1.9*  INR 1.31  --  1.39     INFECTIOUS  Recent Labs Lab 03/27/16 0019 03/27/16 1508 03/28/16 1811 03/29/16 0230 03/29/16 0234 03/30/16 0219   LATICACIDVEN 2.4* 2.1*  --  2.3*  --   --   PROCALCITON  --   --  0.25  --  0.16 <0.10     ENDOCRINE CBG (last 3)   Recent Labs  03/30/16 0013 03/30/16 0413 03/30/16 0751  GLUCAP 157* 159* 181*       IMAGING x48h  - image(s) personally visualized  -   highlighted in bold Dg Chest Port 1 View  03/30/2016  CLINICAL DATA:  82 year old female with respiratory failure, circulatory shock, GI bleed. Initial encounter. EXAM: PORTABLE CHEST 1 VIEW COMPARISON:  03/29/2016 and earlier FINDINGS: Portable AP semi upright view at  0518 hours. Stable endotracheal tube. Enteric tube remains in place, side hole to level of the gastric fundus. Right abdomen IVC filter re- identified. Increased indistinct bilateral hilar and perihilar opacity since 03/27/2016. Associated increased patchy right upper lobe opacity. No superimposed pneumothorax or definite pleural effusion. Stable mediastinal contours, hiatal hernia demonstrated on CT 03/06/2016. Calcified aortic atherosclerosis. Small right chest wall surgical clips are stable. No acute osseous abnormality identified. IMPRESSION: 1.  Stable lines and tubes. 2. Increasing bilateral hilar and perihilar opacity is nonspecific. Top differential considerations in this setting include worsening bilateral pulmonary infection, acute pulmonary edema, and increasing mediastinal lymphadenopathy with atelectasis. Electronically Signed   By: Genevie Ann M.D.   On: 03/30/2016 07:22   Dg Chest Port 1 View  03/29/2016  CLINICAL DATA:  Check endotracheal tube placement EXAM: PORTABLE CHEST 1 VIEW COMPARISON:  03/28/2016 FINDINGS: Endotracheal tube and nasogastric catheter are again seen in satisfactory position. Cardiac shadow is mildly enlarged. Mild patchy infiltrates are seen. Aortic calcifications are again noted and stable. IMPRESSION: Stable appearance of the chest when compared with the prior exam. Electronically Signed   By: Inez Catalina M.D.   On: 03/29/2016 08:29        DISCUSSION: 82 y.o. F admitted 6/29 with hemorrhagic shock due to UGIB and required intubation in ED for airway protection.  GI consulted by EDP . Note .She is currently unresponsive on the ventilator. CXR >> 6/30  indicates bilateral infiltrates vs pulmonary edema, pressor dependent. Goals of care discussion performed with daughters on 7/2. They definitely do not want trach and would want comfort care if Pt approaching vent dependency.  ASSESSMENT / PLAN:  GASTROINTESTINAL A:   UGIB  H/O GERD Cholelithiasis - On CT imaging at admit 03/27/2016 and [place] 03/27/16 Large Hiatal hernia - chronic on imaging   - 03/30/16 - does not seem to have active bleeding. GI would not recommend HIDAA scan, given her current lack of responsiveness on the vent. Do not believe she has cholecystitis and would recommend conservative management. Signed off.  P:   Transition PPI gtt to PO EGD when stable per GI possibly after extubation. TF to continue per Nutrition   HEMATOLOGIC / ONCOLOGIC A:   Anemia - Secondary to UGIB. Leukocytosis - Improving. Likely stress response vs sepsis. Adenopathy - Seen on CT. H/O Breast Cancer  - no active bleed  P:  Trend cell counts daily w/ CBC Transfuse for Hgb < 7. SCD's   CARDIOVASCULAR A:  Shock - Hemorrhagic due to UGIB. H/O CHF (Echo from 03/17/16 with EF 60-65%, severe aortic stenosis) H/O Severe Aortic Stenosis  - needing neo 53mcg and on hydrocort/ ECHO 03/28/16 - similar to June 2017  P:  Monitor on telemetry Vitals per unit protocol Neo-Synephrine to maintain MAP >65 & SBP >90  INFECTIOUS MRSA PCR 6/29:  Negative Blood Ctx x2 6/29 > no growth Urine Ctx 6/29 > no growth Trach Asp Ctx 6/30 > few yeast  A:   Sepsis - Possible intra-abdominal source.   unclear if septic but was febrile and responded to abx. Culture negative as of 03/30/16  P:   ANTIBIOTICS: Vancomycin 6/29 >03/29/16 Zosyn 6/29 >>>  Continue Zosyn. PCT trending down.  De-escalate when able. Re-culture for Temp >101.5  PULMONARY A: #Baseline  - UIP CT Nov 2015 and worse June 2017. Autoimmune neg 2015.  Likely IPF per [facility] June 2017 - Multiple Large GGO - new June 2017 - unclear primary with abd adenopathy  #Now Acute  Hypoxic Respiratory Failure - Unable to protect airway due to GIB  - Unable to wean vent settings yesterday - repeat autoimmune labs- CCP neg, RF 44 (H), remainder are pending  P:   Await repeat autoimmune 03/29/16 Full vent support. Wean as able. VAP prevention measures. SBT when able. Gentle diuresis. Lasix 20mg  IV x 1 today.  RENAL A:   Acute Renal Failure -resolved  72/17 - mild hypomag and hypophos  P:   Trending UOP with Foley Replete mag and phos Monitoring electrolytes & renal function daily NS @ kvo Replacing electrolytes as indicated  ENDOCRINE A:   H/O DM H/O Hypothyroidism. Chronic Prednisone Therapy  P:   Accu-Checks q4hr SSI per Low Dose Algorithm Synthroid IV Hydrocortisone IV q6hr  NEUROLOGIC A:   Acute Encephalopathy Sedation on Ventilator, RASs scores 2 to -3   - hx of prior delirium in hospital and easy sensitivity to fent   P:   Started on Precedex yesterday. Will d/c and try Fentanyl and Versed pushes. RASS goal: 0 to -1. Daily WUA.  IMMUNOLOGIC A: Polymalgia - on chronic prednisone.  P: Hydrocortisone IV q6hr  Family updated: Family at bedside and updated.03/28/16. None bedside 03/30/16  Interdisciplinary Family Meeting v Palliative Care Meeting:  Due by: 07/05.  GLOBAL -  Prior ICU illness with delirium and sacaral sores, underlying IPF, age 68 and ongoing fraility and pressor need all to weigh in on prognosis. Goals of care discussion with family yesterday. Do not want trach and would want comfort care if patient approaching vent dependency.   Martha Bible, MD Family Medicine, PGY-2  03/30/2016 9:21 AM

## 2016-03-31 LAB — CULTURE, BLOOD (ROUTINE X 2)
CULTURE: NO GROWTH
Culture: NO GROWTH

## 2016-03-31 LAB — CBC WITH DIFFERENTIAL/PLATELET
BASOS ABS: 0.3 10*3/uL — AB (ref 0.0–0.1)
BASOS PCT: 2 %
EOS ABS: 0.2 10*3/uL (ref 0.0–0.7)
Eosinophils Relative: 1 %
HCT: 32.1 % — ABNORMAL LOW (ref 36.0–46.0)
Hemoglobin: 10.5 g/dL — ABNORMAL LOW (ref 12.0–15.0)
LYMPHS PCT: 38 %
Lymphs Abs: 6.5 10*3/uL — ABNORMAL HIGH (ref 0.7–4.0)
MCH: 31 pg (ref 26.0–34.0)
MCHC: 32.7 g/dL (ref 30.0–36.0)
MCV: 94.7 fL (ref 78.0–100.0)
MONO ABS: 1.7 10*3/uL — AB (ref 0.1–1.0)
Monocytes Relative: 10 %
NEUTROS PCT: 49 %
Neutro Abs: 8.3 10*3/uL — ABNORMAL HIGH (ref 1.7–7.7)
PLATELETS: 114 10*3/uL — AB (ref 150–400)
RBC: 3.39 MIL/uL — ABNORMAL LOW (ref 3.87–5.11)
RDW: 20 % — AB (ref 11.5–15.5)
WBC: 17 10*3/uL — ABNORMAL HIGH (ref 4.0–10.5)

## 2016-03-31 LAB — BASIC METABOLIC PANEL
ANION GAP: 6 (ref 5–15)
BUN: 17 mg/dL (ref 6–20)
CHLORIDE: 108 mmol/L (ref 101–111)
CO2: 23 mmol/L (ref 22–32)
Calcium: 8 mg/dL — ABNORMAL LOW (ref 8.9–10.3)
Creatinine, Ser: 0.66 mg/dL (ref 0.44–1.00)
Glucose, Bld: 175 mg/dL — ABNORMAL HIGH (ref 65–99)
POTASSIUM: 3.9 mmol/L (ref 3.5–5.1)
SODIUM: 137 mmol/L (ref 135–145)

## 2016-03-31 LAB — MAGNESIUM
MAGNESIUM: 1.8 mg/dL (ref 1.7–2.4)
Magnesium: 1.7 mg/dL (ref 1.7–2.4)

## 2016-03-31 LAB — TRIGLYCERIDES: TRIGLYCERIDES: 66 mg/dL (ref <150)

## 2016-03-31 LAB — ANTINUCLEAR ANTIBODIES, IFA: ANTINUCLEAR ANTIBODIES, IFA: NEGATIVE

## 2016-03-31 LAB — GLUCOSE, CAPILLARY
GLUCOSE-CAPILLARY: 188 mg/dL — AB (ref 65–99)
GLUCOSE-CAPILLARY: 110 mg/dL — AB (ref 65–99)
GLUCOSE-CAPILLARY: 191 mg/dL — AB (ref 65–99)
Glucose-Capillary: 137 mg/dL — ABNORMAL HIGH (ref 65–99)
Glucose-Capillary: 150 mg/dL — ABNORMAL HIGH (ref 65–99)
Glucose-Capillary: 176 mg/dL — ABNORMAL HIGH (ref 65–99)
Glucose-Capillary: 209 mg/dL — ABNORMAL HIGH (ref 65–99)

## 2016-03-31 LAB — POTASSIUM: POTASSIUM: 3.5 mmol/L (ref 3.5–5.1)

## 2016-03-31 LAB — PHOSPHORUS: PHOSPHORUS: 2.9 mg/dL (ref 2.5–4.6)

## 2016-03-31 MED ORDER — FUROSEMIDE 10 MG/ML IJ SOLN
40.0000 mg | Freq: Two times a day (BID) | INTRAMUSCULAR | Status: DC
  Administered 2016-03-31 – 2016-04-02 (×5): 40 mg via INTRAVENOUS
  Filled 2016-03-31 (×6): qty 4

## 2016-03-31 NOTE — Progress Notes (Signed)
PULMONARY / CRITICAL CARE MEDICINE   Name: THERASA Mcbride MRN: [id number] DOB: March 20, 1925    ADMISSION DATE:  03/19/2016 CONSULTATION DATE:  03/22/2016  REFERRING MD:  Kathrynn Humble (EDP)  CHIEF COMPLAINT:  GI Bleed  BRIEF   Martha Mcbride is a 82 y.o. female with PMH as outlined below including ILD for which she is on nocturnal O2 and is followed by Dr. Elsworth Soho.  She presented to [facility] ED 06/29 with bloody emesis and black tarry stools.  Pt was reportedly constipated for several days prior so was given an enema.  She then had large BM consisting of melena followed by hematemesis. In ED, she was minimally responsive therefore was intubated for airway protection.  She was given 2u PRBC empirically and 3L IVF.  Initial Hgb 11.3, Hct 33.8. Post intubation, she was hypotensive; therefore, propofol was discontinued.  Dr. Paulita Fujita with [facility] was consulted by EDP. Pt is on daily ASA 81mg , no additional anti-platelets or anticoagulants. She had recent admission 03/07/16 through 03/09/16 for pneumonia and was on oxygen and prednisone at home at time of admission   has a past medical history of Arthritis; Polymyalgia ([place]); Acid reflux; Immune deficiency disorder ([place]); Breast cancer ([place]); Giardia; Pinworms; History of hiatal hernia; Pneumonia; Dementia; CHF (congestive heart failure) ([place]); Dysrhythmia; Diabetes mellitus without complication ([place]); and Aortic stenosis.  March 2014 - normal mammogram Oct 2015: Admissions - Oct 2015 of HCAP and encephalopathy and diast CHF. Modeate AS Nov 2015 : CT with UIP  with hiatal hernia. Autoimmune negative. Also admittied for recc aspiration pna, severe sepsis and chf, Sacral ulers , sp peg Jan 2016: admit for sacral wounds, RAI -, Bialteral DVT - s/p IVC filter June 2017:  Seen by Dr Elsworth Soho in office - possible progression in UIP - consideration of IPF. New GGO 2cm in CT - PET/Biopsy discison July 2017 - [facility] with severe AS with valve area 0.5 and PASP 29mmHg.   EVENTS and  studies  6/29 - admitted with UGIB, intubated for airway protection.  CTA chest / abd / pelv 6/29 > pleural effusions with atelectasis, stable ILD, adenopathy of the chest abdomen and pelvis concerning for metastasis, moderate to large hiatal hernia, ascites in RUQ, cholelithiasis   03/27/16:  Patient remains critically oh on low-dose vasopressor support. Switching to peripheral Neo-Synephrine. . Ongoing family discussions once we have a better understanding of the etiology of the patient's acute decompensation.  6/30 indicates suspected Upper GI Bleed/ Mallory Weiss tear.with plans for EGD once patient has stabilized.She is a DNR with suspected breast cancer metastasis and previously stated she does not want aggressive care  03/28/16: did SBT but Rt says too weak to extubate. On neo and needing to go up on dose. Family @ bedside. WOC notes stage 2 pressure injur. On tube feeds and protonix gtt. GI notes rec holding off endoscopy til improved; feels no active bleeding    SUBJECTIVE/OVERNIGHT/INTERVAL HX Did PST this am > with increased WOB and tachypneic. Back on full support.  diurescing Looked better.   VITAL SIGNS: BP 134/66 mmHg  Pulse 133  Temp(Src) 99.9 F (37.7 C) (Core (Comment))  Resp 32  Ht 5\' 2"  (1.575 m)  Wt 74.5 kg (164 lb 3.9 oz)  BMI 30.03 kg/m2  SpO2 99%  HEMODYNAMICS:    VENTILATOR SETTINGS: Vent Mode:  [-] PRVC FiO2 (%):  [40 %] 40 % Set Rate:  [20 bmp] 20 bmp Vt Set:  [420 mL] 420 mL PEEP:  [5 cmH20]  5 cmH20 Pressure Support:  [5 cmH20] 5 cmH20  INTAKE / OUTPUT: I/O last 3 completed shifts: In: 4094.5 [I.V.:1257; NG/GT:2100; IV Piggyback:737.5] Out: 1840 [Urine:1840]   PHYSICAL EXAMINATION: General: Elderly chronically ill appearing female, critically ill. VERY FRAIL and DECONDITIONED. Neuro: CN grossly intact. Sedated. (-) lateralizing signs seen.  HEENT: [place]/AT. PERRL, sclerae anicteric. Cardiovascular: RRR, 3/6 SEM radiating into carotids.  Lungs:  Respirations even and unlabored.  Coarse bilaterally. Crackles at bases.  Abdomen: BS x 4, soft, NT/ND.  Musculoskeletal: No gross deformities, no edema. VERY FRAIL  Skin: Intact, warm, no rashes.  LABS: PULMONARY  Recent Labs Lab 03/25/2016 1740 03/27/2016 2026 03/27/16 0326  PHART  --  7.341* 7.345*  PCO2ART  --  39.2 40.5  PO2ART  --  137.0* 157.0*  HCO3  --  20.9 22.0  TCO2 19 22 23   O2SAT  --  99.0 99.0    CBC  Recent Labs Lab 03/29/16 0234 03/30/16 0219 03/31/16 0306  HGB 9.7* 9.8* 10.5*  HCT 29.9* 30.4* 32.1*  WBC 14.1* 10.5 17.0*  PLT 111* 99* 114*    COAGULATION  Recent Labs Lab 03/11/2016 1727 03/29/16 0234  INR 1.31 1.39    CARDIAC    Recent Labs Lab 02/27/2016 2214 03/27/16 0019  TROPONINI 0.17* 0.21*   No results for input(s): PROBNP in the last 168 hours.   CHEMISTRY  Recent Labs Lab 03/27/16 0742  03/28/16 0239 03/29/16 0234 03/30/16 0219 03/30/16 1227 03/31/16 0306 03/31/16 1537  NA 133*  --  134* 135 141  --  137  --   K 5.1  < > 4.3 3.8 3.8  --  3.9 3.5  CL 104  --  107 109 105  --  108  --   CO2 21*  --  21* 21* 20*  --  23  --   GLUCOSE 211*  --  130* 147* 162*  --  175*  --   BUN 34*  --  26* 16 17  --  17  --   CREATININE 0.68  --  0.71 0.67 0.70  --  0.66  --   CALCIUM 8.2*  --  7.8* 7.6* 8.9  --  8.0*  --   MG 2.2  --   --  1.8 2.0  --  1.8 1.7  PHOS 3.7  --   --  2.1* 1.9* 2.5 2.9  --   < > = values in this interval not displayed. Estimated Creatinine Clearance: 46 mL/min (by C-G formula based on Cr of 0.66).   LIVER  Recent Labs Lab 03/01/2016 1727 03/27/16 1508 03/29/16 0234  AST 45* 28 26  ALT 18 14 16   ALKPHOS 104 73 76  BILITOT 1.4* 1.1 1.2  PROT 6.5 5.4* 5.7*  ALBUMIN 2.4* 1.9* 1.9*  INR 1.31  --  1.39     INFECTIOUS  Recent Labs Lab 03/27/16 0019 03/27/16 1508 03/28/16 1811 03/29/16 0230 03/29/16 0234 03/30/16 0219  LATICACIDVEN 2.4* 2.1*  --  2.3*  --   --   PROCALCITON  --   --  0.25   --  0.16 <0.10     ENDOCRINE CBG (last 3)   Recent Labs  03/31/16 0747 03/31/16 1158 03/31/16 1523  GLUCAP 188* 110* 191*         IMAGING x48h  - image(s) personally visualized  -   highlighted in bold Dg Chest Port 1 View  03/30/2016  CLINICAL DATA:  82 year old female with respiratory failure, circulatory shock,  GI bleed. Initial encounter. EXAM: PORTABLE CHEST 1 VIEW COMPARISON:  03/29/2016 and earlier FINDINGS: Portable AP semi upright view at 0518 hours. Stable endotracheal tube. Enteric tube remains in place, side hole to level of the gastric fundus. Right abdomen IVC filter re- identified. Increased indistinct bilateral hilar and perihilar opacity since 03/27/2016. Associated increased patchy right upper lobe opacity. No superimposed pneumothorax or definite pleural effusion. Stable mediastinal contours, hiatal hernia demonstrated on CT 03/06/2016. Calcified aortic atherosclerosis. Small right chest wall surgical clips are stable. No acute osseous abnormality identified. IMPRESSION: 1.  Stable lines and tubes. 2. Increasing bilateral hilar and perihilar opacity is nonspecific. Top differential considerations in this setting include worsening bilateral pulmonary infection, acute pulmonary edema, and increasing mediastinal lymphadenopathy with atelectasis. Electronically Signed   By: Genevie Ann M.D.   On: 03/30/2016 07:22       DISCUSSION: 82 y.o. F admitted 6/29 with hemorrhagic shock due to UGIB and required intubation in ED for airway protection.  GI consulted by EDP . Note .She is currently unresponsive on the ventilator. CXR >> 6/30  indicates bilateral infiltrates vs pulmonary edema, pressor dependent. Will need to have discussion with family regarding realistic goals of care versus continued escalation of care.If she cannot be liberated from life support, progress to comfort care.  ASSESSMENT / PLAN:  GASTROINTESTINAL A:   UGIB. No active bleeding now.  H/O  GERD Cholelithiasis - On CT imaging at admit 03/01/2016 and [place] 03/27/16 Large Hiatal hernia - chronic on imaging  P:   Cont PO  PPI EGD when stable per GI possibley after extubation TF to continue   HEMATOLOGIC / ONCOLOGIC A:   Anemia - Secondary to UGIB. Leukocytosis - Improving. Likely stress response vs sepsis. Adenopathy - Seen on CT. H/O Breast Cancer  P:  Trend cell counts daily w/ CBC Transfuse for Hgb < 7. Repeat Hgb/Hct at 1700 SCD's   CARDIOVASCULAR A:  Shock - Hemorrhagic due to UGIB. H/O CHF (Echo from 03/17/16 with EF 60-65%, severe aortic stenosis) H/O Severe Aortic Stenosis  P:  Monitor on telemetry Vitals per unit protocol Neo-Synephrine to maintain MAP >65 & SBP >90 Gentle diuresis 2/2 severe AS. Needs more diuresis today  INFECTIOUS MRSA PCR 6/29:  Negative Blood Ctx x2 6/29 > Urine Ctx 6/29 > Trach Asp Ctx 6/30 >  A:   Sepsis - Possible intra abdominal source. Possible HCAP R > L. CXR on 7/3 worse infiltrates R>L.   P:   Continue Abx for now. PCT is better. Deescalate in 1-2 days.  ANTIBIOTICS: Vancomycin 6/29 >03/29/16 Zosyn 6/29 >   PULMONARY A: Acute Hypoxemic respiratory failure 2/2 Volume overload/Pulmonary edema/possible HCAP R > L/Unable to protect airway 2/2 UGIB. Has ILD/IPF. Mulptiple Large GGO - new June 2017 - unclear primary with abd adenopaty  P:   Await repeat autoimmune 03/29/16 Full vent support. Wean as able. Not ready today. With inc WOB during PST.  VAP prevention measures. SBT  When able. Diurese as pt is volume overloaded.   RENAL A:   Acute Renal Failure- resolved Volume overload    P:   Trending UOP with Foley Replete mag and phos Monitoring electrolytes & renal function daily Diuresce gently as pt with severe AS and anasarca  ENDOCRINE A:   H/O DM H/O Hypothyroidism. Chronic Prednisone Therapy  P:   Accu-Checks q4hr SSI per Low Dose Algorithm Synthroid IV Hydrocortisone IV  q6hr  NEUROLOGIC A:   Acute Encephalopathy Sedation on Ventilator  P:  D/C precedex.  She was too sedated on fentanyl drip. Try  Fentanyl pushes and versed pushes.  RASS goal: 0 to -1. Daily WUA.  IMMUNOLOGIC A: Polymalgia - on chronic prednisone.  P: Hydrocortisone IV q6hr  Family updated: Family at bedside and updated on 7/3.   Interdisciplinary Family Meeting v Palliative Care Meeting:  Due by: 07/05.  Critical care time with this patient today : 35 minutes  J. Shirl Harris, MD 03/31/2016, 6:37 PM [facility] Pager [phone] After 3 pm or if no answer, call [phone]     03/31/2016 6:37 PM

## 2016-04-01 ENCOUNTER — Inpatient Hospital Stay (HOSPITAL_COMMUNITY): Payer: Medicare Other

## 2016-04-01 DIAGNOSIS — I350 Nonrheumatic aortic (valve) stenosis: Secondary | ICD-10-CM

## 2016-04-01 LAB — MAGNESIUM
MAGNESIUM: 1.6 mg/dL — AB (ref 1.7–2.4)
Magnesium: 2.1 mg/dL (ref 1.7–2.4)

## 2016-04-01 LAB — BASIC METABOLIC PANEL
ANION GAP: 9 (ref 5–15)
Anion gap: 8 (ref 5–15)
BUN: 20 mg/dL (ref 6–20)
BUN: 21 mg/dL — AB (ref 6–20)
CHLORIDE: 103 mmol/L (ref 101–111)
CO2: 28 mmol/L (ref 22–32)
CO2: 28 mmol/L (ref 22–32)
CREATININE: 0.59 mg/dL (ref 0.44–1.00)
Calcium: 7.9 mg/dL — ABNORMAL LOW (ref 8.9–10.3)
Calcium: 8 mg/dL — ABNORMAL LOW (ref 8.9–10.3)
Chloride: 105 mmol/L (ref 101–111)
Creatinine, Ser: 0.64 mg/dL (ref 0.44–1.00)
GFR calc Af Amer: >60 mL/min (ref >60)
GLUCOSE: 156 mg/dL — AB (ref 65–99)
Glucose, Bld: 179 mg/dL — ABNORMAL HIGH (ref 65–99)
POTASSIUM: 2.9 mmol/L — AB (ref 3.5–5.1)
POTASSIUM: 3.4 mmol/L — AB (ref 3.5–5.1)
SODIUM: 140 mmol/L (ref 135–145)
SODIUM: 141 mmol/L (ref 135–145)

## 2016-04-01 LAB — GLUCOSE, CAPILLARY
GLUCOSE-CAPILLARY: 179 mg/dL — AB (ref 65–99)
GLUCOSE-CAPILLARY: 189 mg/dL — AB (ref 65–99)
GLUCOSE-CAPILLARY: 174 mg/dL — AB (ref 65–99)
Glucose-Capillary: 148 mg/dL — ABNORMAL HIGH (ref 65–99)
Glucose-Capillary: 163 mg/dL — ABNORMAL HIGH (ref 65–99)
Glucose-Capillary: 185 mg/dL — ABNORMAL HIGH (ref 65–99)

## 2016-04-01 LAB — CBC WITH DIFFERENTIAL/PLATELET
BASOS ABS: 0 10*3/uL (ref 0.0–0.1)
Band Neutrophils: 3 %
Basophils Relative: 0 %
EOS ABS: 0.4 10*3/uL (ref 0.0–0.7)
Eosinophils Relative: 2 %
HEMATOCRIT: 31.2 % — AB (ref 36.0–46.0)
Hemoglobin: 10.1 g/dL — ABNORMAL LOW (ref 12.0–15.0)
LYMPHS ABS: 7.2 10*3/uL — AB (ref 0.7–4.0)
LYMPHS PCT: 35 %
MCH: 30.8 pg (ref 26.0–34.0)
MCHC: 32.4 g/dL (ref 30.0–36.0)
MCV: 95.1 fL (ref 78.0–100.0)
MONO ABS: 2.9 10*3/uL — AB (ref 0.1–1.0)
Metamyelocytes Relative: 1 %
Monocytes Relative: 14 %
NEUTROS ABS: 10.2 10*3/uL — AB (ref 1.7–7.7)
NEUTROS PCT: 43 %
PLATELETS: 135 10*3/uL — AB (ref 150–400)
Promyelocytes Absolute: 2 %
RBC: 3.28 MIL/uL — AB (ref 3.87–5.11)
RDW: 20.3 % — AB (ref 11.5–15.5)
WBC: 20.7 10*3/uL — ABNORMAL HIGH (ref 4.0–10.5)

## 2016-04-01 LAB — POTASSIUM
POTASSIUM: 3.5 mmol/L (ref 3.5–5.1)
POTASSIUM: 3.7 mmol/L (ref 3.5–5.1)
POTASSIUM: 3.4 mmol/L — AB (ref 3.5–5.1)

## 2016-04-01 LAB — TRIGLYCERIDES: TRIGLYCERIDES: 88 mg/dL (ref <150)

## 2016-04-01 LAB — PHOSPHORUS: PHOSPHORUS: 2.6 mg/dL (ref 2.5–4.6)

## 2016-04-01 MED ORDER — MAGNESIUM SULFATE 2 GM/50ML IV SOLN
2.0000 g | Freq: Once | INTRAVENOUS | Status: AC
  Administered 2016-04-01: 2 g via INTRAVENOUS
  Filled 2016-04-01: qty 50

## 2016-04-01 MED ORDER — ACETAMINOPHEN 160 MG/5ML PO SOLN
650.0000 mg | ORAL | Status: DC | PRN
  Administered 2016-04-01 – 2016-04-02 (×4): 650 mg
  Filled 2016-04-01 (×4): qty 20.3

## 2016-04-01 MED ORDER — POTASSIUM CHLORIDE 20 MEQ/15ML (10%) PO SOLN
40.0000 meq | ORAL | Status: AC
  Administered 2016-04-01 (×2): 40 meq
  Filled 2016-04-01 (×2): qty 30

## 2016-04-01 NOTE — Progress Notes (Signed)
Pharmacy Antibiotic Note  Martha Mcbride is a 82 y.o. female admitted on 03/20/2016 with sepsis.  All cx neg/pending. Pt having intermittent fevers and WBC is trending up to 20.7. CXR today reveals stable patchy pneumonia with improved pulmonary edema.   Plan: Zosyn 3.375g IV q8h (4 hour infusion).  Monitor culture data, renal function and clinical course   Height: 5\' 2"  (157.5 cm) Weight: 161 lb 9.6 oz (73.3 kg) IBW/kg (Calculated) : 50.1  Temp (24hrs), Avg:100.3 F (37.9 C), Min:99.5 F (37.5 C), Max:101.8 F (38.8 C)   Recent Labs Lab 03/18/2016 1741 03/19/2016 2215 03/27/16 0019  03/27/16 1508 03/28/16 0239 03/29/16 0230 03/29/16 0234 03/30/16 0219 03/31/16 0306 04/01/16 0252  WBC  --  22.2*  --   < >  --  14.8*  --  14.1* 10.5 17.0* 20.7*  CREATININE  --   --   --   < >  --  0.71  --  0.67 0.70 0.66 0.64  LATICACIDVEN 8.55* 3.8* 2.4*  --  2.1*  --  2.3*  --   --   --   --   < > = values in this interval not displayed.  Estimated Creatinine Clearance: 45.6 mL/min (by C-G formula based on Cr of 0.64).    Allergies  Allergen Reactions  . Adhesive [Tape] Other (See Comments)    Tears skin off, Please use "paper" tape  . Doxycycline Nausea And Vomiting  . Other Nausea And Vomiting and Other (See Comments)    Anesthesia makes her very sleepy and makes it hard for her to come out of it.   Marland Kitchen Penicillins Hives and Nausea And Vomiting    Has patient had a PCN reaction causing immediate rash, facial/tongue/throat swelling, SOB or lightheadedness with hypotension: Yes Has patient had a PCN reaction causing severe rash involving mucus membranes or skin necrosis: No Has patient had a PCN reaction that required hospitalization No Has patient had a PCN reaction occurring within the last 10 years: No If all of the above answers are "NO", then may proceed with Cephalosporin use.   . Shellfish Allergy Nausea And Vomiting  . Sulfa Antibiotics Nausea And Vomiting  . Sulfamethoxazole  Nausea And Vomiting  . Meperidine Hives and Rash    Unknown  . Methotrexate Rash    Bleeding under the skin    Antimicrobials this admission: Vanc 6/29 >> 7/2 Zosyn 6/29 >>   Dose adjustments this admission: n/a  Microbiology results: 6/29 Blood x 2 NGx2 6/29 Urine - negative 6/30 Resp Cx - few yeast 6/29 MRSA PCR - negative  Andrey Cota. Diona Foley, PharmD, [place] Clinical Pharmacist Pager [phone]  04/01/2016 9:23 AM

## 2016-04-01 NOTE — Progress Notes (Signed)
PULMONARY / CRITICAL CARE MEDICINE   Name: Martha Mcbride MRN: [id number] DOB: 05-Nov-1924    ADMISSION DATE:  03/15/2016 CONSULTATION DATE:  03/17/2016  REFERRING MD:  Kathrynn Humble (EDP)  CHIEF COMPLAINT:  GI Bleed  BRIEF   Martha Mcbride is a 82 y.o. female with PMH as outlined below including ILD for which she is on nocturnal O2 and is followed by Dr. Elsworth Soho.  She presented to [facility] ED 06/29 with bloody emesis and black tarry stools.  Pt was reportedly constipated for several days prior so was given an enema.  She then had large BM consisting of melena followed by hematemesis. In ED, she was minimally responsive therefore was intubated for airway protection.  She was given 2u PRBC empirically and 3L IVF.  Initial Hgb 11.3, Hct 33.8. Post intubation, she was hypotensive; therefore, propofol was discontinued.  Dr. Paulita Fujita with [facility] was consulted by EDP. Pt is on daily ASA 81mg , no additional anti-platelets or anticoagulants. She had recent admission 03/07/16 through 03/09/16 for pneumonia and was on oxygen and prednisone at home at time of admission   has a past medical history of Arthritis; Polymyalgia ([place]); Acid reflux; Immune deficiency disorder ([place]); Breast cancer ([place]); Giardia; Pinworms; History of hiatal hernia; Pneumonia; Dementia; CHF (congestive heart failure) ([place]); Dysrhythmia; Diabetes mellitus without complication ([place]); and Aortic stenosis.  March 2014 - normal mammogram Oct 2015: Admissions - Oct 2015 of HCAP and encephalopathy and diast CHF. Modeate AS Nov 2015 : CT with UIP  with hiatal hernia. Autoimmune negative. Also admittied for recc aspiration pna, severe sepsis and chf, Sacral ulers , sp peg Jan 2016: admit for sacral wounds, RAI -, Bialteral DVT - s/p IVC filter June 2017:  Seen by Dr Elsworth Soho in office - possible progression in UIP - consideration of IPF. New GGO 2cm in CT - PET/Biopsy discison July 2017 - [facility] with severe AS with valve area 0.5 and PASP 69mmHg.   EVENTS and  studies  6/29 - admitted with UGIB, intubated for airway protection.  CTA chest / abd / pelv 6/29 > pleural effusions with atelectasis, stable ILD, adenopathy of the chest abdomen and pelvis concerning for metastasis, moderate to large hiatal hernia, ascites in RUQ, cholelithiasis  03/27/16:  Patient remains critically oh on low-dose vasopressor support. Switching to peripheral Neo-Synephrine. . Ongoing family discussions once we have a better understanding of the etiology of the patient's acute decompensation.  6/30 indicates suspected Upper GI Bleed/ Mallory Weiss tear.with plans for EGD once patient has stabilized.She is a DNR with suspected breast cancer metastasis and previously stated she does not want aggressive care    SUBJECTIVE:  Patient has been doing daily weaning trials. Slow to improve, looks better, more awake, but overall, still has increased work of breathing despite being on full vent support.  VITAL SIGNS: BP 99/39 mmHg  Pulse 104  Temp(Src) 100.9 F (38.3 C) (Core (Comment))  Resp 24  Ht 5\' 2"  (1.575 m)  Wt 161 lb 9.6 oz (73.3 kg)  BMI 29.55 kg/m2  SpO2 97%  HEMODYNAMICS:    VENTILATOR SETTINGS: Vent Mode:  [-] PRVC FiO2 (%):  [40 %] 40 % Set Rate:  [20 bmp] 20 bmp Vt Set:  [420 mL] 420 mL PEEP:  [5 cmH20] 5 cmH20 Pressure Support:  [5 cmH20] 5 cmH20  INTAKE / OUTPUT: I/O last 3 completed shifts: In: 2974.7 [I.V.:652.6; NG/GT:2022.1; IV Piggyback:300] Out: 5340 [Urine:5340]   PHYSICAL EXAMINATION: General: Elderly chronically ill appearing female, critically ill.  VERY FRAIL and DECONDITIONED. Neuro: CN grossly intact. Awake, alert. HEENT: [place]/AT. PERRL, sclerae anicteric. Cardiovascular: RRR, 3/6 SEM radiating into carotids.  Lungs: Respirations even and unlabored.  Coarse breath sounds and rhonchi diffusely.  Abdomen: BS x 4, soft, NT/ND.  Musculoskeletal: No gross deformities, no edema. VERY FRAIL  Skin: Intact, warm, no  rashes.  LABS: PULMONARY  Recent Labs Lab 03/23/2016 1740 03/07/2016 2026 03/27/16 0326  PHART  --  7.341* 7.345*  PCO2ART  --  39.2 40.5  PO2ART  --  137.0* 157.0*  HCO3  --  20.9 22.0  TCO2 19 22 23   O2SAT  --  99.0 99.0    CBC  Recent Labs Lab 03/30/16 0219 03/31/16 0306 04/01/16 0252  HGB 9.8* 10.5* 10.1*  HCT 30.4* 32.1* 31.2*  WBC 10.5 17.0* 20.7*  PLT 99* 114* 135*    COAGULATION  Recent Labs Lab 03/04/2016 1727 03/29/16 0234  INR 1.31 1.39    CARDIAC    Recent Labs Lab 03/18/2016 2214 03/27/16 0019  TROPONINI 0.17* 0.21*   No results for input(s): PROBNP in the last 168 hours.   CHEMISTRY  Recent Labs Lab 03/28/16 0239 03/29/16 0234 03/30/16 0219 03/30/16 1227 03/31/16 0306 03/31/16 1537 04/01/16 0252  NA 134* 135 141  --  137  --  140  K 4.3 3.8 3.8  --  3.9 3.5 2.9*  CL 107 109 105  --  108  --  103  CO2 21* 21* 20*  --  23  --  28  GLUCOSE 130* 147* 162*  --  175*  --  179*  BUN 26* 16 17  --  17  --  20  CREATININE 0.71 0.67 0.70  --  0.66  --  0.64  CALCIUM 7.8* 7.6* 8.9  --  8.0*  --  7.9*  MG  --  1.8 2.0  --  1.8 1.7 1.6*  PHOS  --  2.1* 1.9* 2.5 2.9  --  2.6   Estimated Creatinine Clearance: 45.6 mL/min (by C-G formula based on Cr of 0.64).   LIVER  Recent Labs Lab 03/16/2016 1727 03/27/16 1508 03/29/16 0234  AST 45* 28 26  ALT 18 14 16   ALKPHOS 104 73 76  BILITOT 1.4* 1.1 1.2  PROT 6.5 5.4* 5.7*  ALBUMIN 2.4* 1.9* 1.9*  INR 1.31  --  1.39     INFECTIOUS  Recent Labs Lab 03/27/16 0019 03/27/16 1508 03/28/16 1811 03/29/16 0230 03/29/16 0234 03/30/16 0219  LATICACIDVEN 2.4* 2.1*  --  2.3*  --   --   PROCALCITON  --   --  0.25  --  0.16 <0.10     ENDOCRINE CBG (last 3)   Recent Labs  03/31/16 1954 04/01/16 0006 04/01/16 0403  GLUCAP 176* 148* 179*     IMAGING x48h No results found.   DISCUSSION: 82 y.o. F admitted 6/29 with hemorrhagic shock due to UGIB and required intubation in ED for  airway protection.  GI consulted by EDP . Note .She is currently unresponsive on the ventilator. CXR >> 6/30  indicates bilateral infiltrates vs pulmonary edema, pressor dependent. Will need to have discussion with family regarding realistic goals of care versus continued escalation of care.If she cannot be liberated from life support, progress to comfort care.  ASSESSMENT / PLAN:  GASTROINTESTINAL A:   S/P UGIB. No active bleeding now.  This was the reason for admission. H/O GERD Cholelithiasis - On CT imaging at admit 03/10/2016 and [place] 03/27/16 Large  Hiatal hernia - chronic on imaging  P:   Cont PO PPI EGD when stable per GI possibly after extubation.  Continue TF   HEMATOLOGIC / ONCOLOGIC A:   Anemia - Secondary to UGIB. Leukocytosis - Worsening. Likely stress response vs sepsis. Adenopathy - Seen on CT. H/O Breast Cancer  P:  Trend cell counts daily w/ CBC Transfuse for Hgb < 7. SCD's Abx as below   CARDIOVASCULAR A:  Shock - initially related to hemorrhagic shock. The last several days, likely related to sedation. Acute CHF exacerbation (Echo from 03/17/16 with EF 60-65%, severe aortic stenosis) Volume overload Severe Aortic Stenosis  P:  Monitor on telemetry Vitals per unit protocol Neo-Synephrine to maintain MAP >65 & SBP >90. She is on low-dose Neo-Synephrine. Continue diuresis. Observe blood pressure with diuresis as she has severe aortic stenosis.    INFECTIOUS  A:   Sepsis :  Possible intra abdominal source.   Possible HCAP R > L. CXR on 7/3 worse infiltrates R>L.   P:   Continue Zosyn for now. Cultures have been (-). PCT on 7/3 reassuring.     PULMONARY A: Acute Hypoxemic Respiratory [place] 2/2   Volume overload/Pulmonary edema  possible HCAP R > L  Unable to protect airway 2/2 UGIB on admission Has ILD/IPF. Mulptiple Large GGO - new June 2017 - unclear primary with abd adenopaty  P:   Continue vent support. Continue weaning trials. She is  slowly getting better every day but I am not sure if she will be optimal enough to be extubated. Trying to diuresce  her. Continue antibiotics. Patient is a DO NOT RESUSCITATE. In one to 2 days, if she is not clinically improved, family will consider making her comfortable and extubate terminally. Await repeat autoimmune 03/29/16  RENAL A:   Acute Renal Failure- resolved Volume overload P:   Trending UOP with Foley Replete mag and phos Monitoring electrolytes & renal function daily Diurese daily  ENDOCRINE A:   H/O DM H/O Hypothyroidism. Chronic Prednisone Therapy  P:   Accu-Checks q4hr SSI per Low Dose Algorithm Synthroid IV Hydrocortisone IV   NEUROLOGIC A:   Acute Encephalopathy Sedation on Ventilator  P:   Continue Fentanyl pushes and versed pushes.  RASS goal: 0 to -1. Daily WUA.  IMMUNOLOGIC A: Polymyalgia - on chronic prednisone.  P: Hydrocortisone IV q12   Interdisciplinary Family Meeting v Palliative Care Meeting:  Due by: 07/05.  Hyman Bible, MD Family Medicine, PGY-2 04/01/2016 7:04 AM  ATTENDING NOTE / ATTESTATION NOTE :   I have discussed the case with the resident/APP Dr. Brett Albino   I agree with the resident/APP's  history, physical examination, assessment, and plans.    I have edited the above note and modified it according to our agreed history, physical examination, assessment and plan.   I have spent 35 minutes of critical care time with this patient today.  Family :Family updated at length today.  I updated patient's daughter regarding patient's condition overall. I mentioned to the daughter that overall picture does not look good but we will keep on doing what were doing as far as weaning is concerned. Patient is a DO NOT RESUSCITATE. I mentioned, in 1-2 days, if she continues to be the same way, we may need to do a tracheostomy. Daughter does not want a tracheostomy. In 1-2 days, if she's not better, family will switch her to comfort care and do  terminal extubation. Daughter does not want her mother to be suffering. She  wants her comfortable before extubation   J. Shirl Harris, MD 04/01/2016, 1:21 PM [facility] Pager [phone] After 3 pm or if no answer, call [phone]

## 2016-04-02 DIAGNOSIS — R509 Fever, unspecified: Secondary | ICD-10-CM

## 2016-04-02 DIAGNOSIS — I4891 Unspecified atrial fibrillation: Secondary | ICD-10-CM

## 2016-04-02 LAB — CBC
HCT: 34.5 % — ABNORMAL LOW (ref 36.0–46.0)
HEMOGLOBIN: 10.7 g/dL — AB (ref 12.0–15.0)
MCH: 30.5 pg (ref 26.0–34.0)
MCHC: 31 g/dL (ref 30.0–36.0)
MCV: 98.3 fL (ref 78.0–100.0)
PLATELETS: 124 10*3/uL — AB (ref 150–400)
RBC: 3.51 MIL/uL — ABNORMAL LOW (ref 3.87–5.11)
RDW: 20.3 % — AB (ref 11.5–15.5)
WBC: 17.8 10*3/uL — ABNORMAL HIGH (ref 4.0–10.5)

## 2016-04-02 LAB — PHOSPHORUS: PHOSPHORUS: 2.9 mg/dL (ref 2.5–4.6)

## 2016-04-02 LAB — GLUCOSE, CAPILLARY
Glucose-Capillary: 152 mg/dL — ABNORMAL HIGH (ref 65–99)
Glucose-Capillary: 185 mg/dL — ABNORMAL HIGH (ref 65–99)
Glucose-Capillary: 188 mg/dL — ABNORMAL HIGH (ref 65–99)

## 2016-04-02 LAB — BASIC METABOLIC PANEL
Anion gap: 6 (ref 5–15)
BUN: 21 mg/dL — AB (ref 6–20)
CALCIUM: 8 mg/dL — AB (ref 8.9–10.3)
CO2: 32 mmol/L (ref 22–32)
CREATININE: 0.63 mg/dL (ref 0.44–1.00)
Chloride: 105 mmol/L (ref 101–111)
Glucose, Bld: 183 mg/dL — ABNORMAL HIGH (ref 65–99)
Potassium: 3.8 mmol/L (ref 3.5–5.1)
SODIUM: 143 mmol/L (ref 135–145)

## 2016-04-02 LAB — MAGNESIUM: MAGNESIUM: 2.1 mg/dL (ref 1.7–2.4)

## 2016-04-02 LAB — POTASSIUM: POTASSIUM: 3.2 mmol/L — AB (ref 3.5–5.1)

## 2016-04-02 MED ORDER — ATROPINE SULFATE 1 % OP SOLN
1.0000 [drp] | Freq: Three times a day (TID) | OPHTHALMIC | Status: DC | PRN
  Administered 2016-04-02: 2 [drp] via SUBLINGUAL
  Filled 2016-04-02 (×2): qty 2

## 2016-04-02 MED ORDER — METOPROLOL TARTRATE 5 MG/5ML IV SOLN
2.5000 mg | INTRAVENOUS | Status: DC | PRN
  Administered 2016-04-02: 5 mg via INTRAVENOUS
  Administered 2016-04-02: 2.5 mg via INTRAVENOUS
  Filled 2016-04-02: qty 5

## 2016-04-02 MED ORDER — METOPROLOL TARTRATE 5 MG/5ML IV SOLN
INTRAVENOUS | Status: AC
  Administered 2016-04-02: 2.5 mg via INTRAVENOUS
  Filled 2016-04-02: qty 5

## 2016-04-02 MED ORDER — POTASSIUM CHLORIDE 20 MEQ/15ML (10%) PO SOLN
30.0000 meq | ORAL | Status: AC
  Administered 2016-04-02 (×2): 30 meq
  Filled 2016-04-02 (×2): qty 30

## 2016-04-02 MED ORDER — WHITE PETROLATUM GEL
Status: AC
  Administered 2016-04-02: 0.2
  Filled 2016-04-02: qty 1

## 2016-04-02 MED ORDER — LORAZEPAM 2 MG/ML IJ SOLN
1.0000 mg | INTRAMUSCULAR | Status: DC | PRN
  Administered 2016-04-02: 2 mg via INTRAVENOUS
  Filled 2016-04-02: qty 1

## 2016-04-02 MED ORDER — MORPHINE BOLUS VIA INFUSION
5.0000 mg | INTRAVENOUS | Status: DC | PRN
  Administered 2016-04-02: 12.5 mg via INTRAVENOUS
  Administered 2016-04-02: 10 mg via INTRAVENOUS
  Filled 2016-04-02 (×3): qty 20

## 2016-04-02 MED ORDER — AMIODARONE HCL IN DEXTROSE 360-4.14 MG/200ML-% IV SOLN
60.0000 mg/h | INTRAVENOUS | Status: AC
  Administered 2016-04-02 (×2): 60 mg/h via INTRAVENOUS
  Filled 2016-04-02: qty 200

## 2016-04-02 MED ORDER — MORPHINE SULFATE 25 MG/ML IV SOLN
1.0000 mg/h | INTRAVENOUS | Status: DC
  Administered 2016-04-02: 10 mg/h via INTRAVENOUS
  Filled 2016-04-02 (×3): qty 10

## 2016-04-02 MED ORDER — AMIODARONE HCL IN DEXTROSE 360-4.14 MG/200ML-% IV SOLN
30.0000 mg/h | INTRAVENOUS | Status: DC
  Filled 2016-04-02 (×3): qty 200

## 2016-04-02 MED ORDER — SODIUM CHLORIDE 0.9 % IV BOLUS (SEPSIS)
250.0000 mL | Freq: Once | INTRAVENOUS | Status: AC
  Administered 2016-04-02: 250 mL via INTRAVENOUS

## 2016-04-03 LAB — GLUCOSE, CAPILLARY: Glucose-Capillary: 208 mg/dL — ABNORMAL HIGH (ref 65–99)

## 2016-04-06 ENCOUNTER — Telehealth: Payer: Self-pay

## 2016-04-06 NOTE — Telephone Encounter (Signed)
On 04/06/2016 I received a death certificate from [employer] ([place] location) (original). The death certificate is for burial. The patient is a patient of Doctor IT consultant. The death certificate will be taken to [facility] tomorrow am for signature. On 04/14/16 I received the death certificate back from [place]. I got the death certificate ready and called the funeral home to let them know the death certificate is ready for pickup.

## 2016-04-07 NOTE — Discharge Summary (Signed)
DEATH NOTE: For a complete accounting of the patient's history and physical on presentation please refer to the H&P dictated on 02/27/2016. Patient initially admitted with hematemesis and melanotic stool. In the emergency department the patient was minimally responsive and after family discussion the decision was made to perform endotracheal intubation for airway protection. Gastroenterology was consulted but deferred endoscopy until patient extubated and stabilized. The patient had persistent hypoxic respiratory failure secondary to acutely decompensated diastolic congestive heart failure with known severe aortic stenosis. The patient ultimately developed atrial fibrillation with rapid ventricular response as well. After family discussion the decision was made to proceed with full comfort care per patient's previously reported wishes. She was continued on an amiodarone infusion for control of her heart rhythm/rate for her comfort and started on a morphine infusion for relief of her dyspnea postextubation. Patient was subsequently extubated and at 11 PM on 04/20/16 the patient had no heart toes are auscultation. The patient was in asystole on telemetry monitoring and pupils were fixed and dilated. Chaplain was at bedside as well as family.  DIAGNOSES AT DEATH: 1. Acute Hypoxic Respiratory Failure 2. Acute Encephalopathy 3. Acute Diastolic Congestive Heart Failure 4. Atrial Fibrillation with Rapid Ventricular Response 5. Possible Sepsis 6. Upper Gastrointestinal Bleed 7. Severe Aortic Stenosis 8. Adenopathy 9. H/O Interstitial Lung Disease 10. H/O Diabetes Mellitus 11. H/O Hypothyroidism 12. H/O Chronic Steroid Therapy

## 2016-04-09 ENCOUNTER — Ambulatory Visit: Payer: Medicare Other | Admitting: Cardiology

## 2016-04-19 LAB — ACETYLCHOLINE RECEPTOR AB, ALL: Acetylchol Block Ab: 16 % (ref 0–25)

## 2016-04-28 NOTE — Progress Notes (Signed)
PULMONARY / CRITICAL CARE MEDICINE   Name: Martha Mcbride MRN: [id number] DOB: [birth year]    ADMISSION DATE:  03/20/2016 CONSULTATION DATE:  03/16/2016  REFERRING MD:  Kathrynn Humble (EDP)  CHIEF COMPLAINT:  GI Bleed  BRIEF   Martha Mcbride is a 82 y.o. female with PMH as outlined below including ILD for which she is on nocturnal O2 and is followed by Dr. Elsworth Soho.  She presented to [facility] ED 06/29 with bloody emesis and black tarry stools.  Pt was reportedly constipated for several days prior so was given an enema.  She then had large BM consisting of melena followed by hematemesis. In ED, she was minimally responsive therefore was intubated for airway protection.  She was given 2u PRBC empirically and 3L IVF.  Initial Hgb 11.3, Hct 33.8. Post intubation, she was hypotensive; therefore, propofol was discontinued.  Dr. Paulita Fujita with [facility] was consulted by EDP. Pt is on daily ASA 81mg , no additional anti-platelets or anticoagulants. She had recent admission 03/07/16 through 03/09/16 for pneumonia and was on oxygen and prednisone at home at time of admission   has a past medical history of Arthritis; Polymyalgia ([place]); Acid reflux; Immune deficiency disorder ([place]); Breast cancer ([place]); Giardia; Pinworms; History of hiatal hernia; Pneumonia; Dementia; CHF (congestive heart failure) ([place]); Dysrhythmia; Diabetes mellitus without complication ([place]); and Aortic stenosis.  March 2014 - normal mammogram Oct 2015: Admissions - Oct 2015 of HCAP and encephalopathy and diast CHF. [place] AS Nov 2015 : CT with UIP  with hiatal hernia. Autoimmune negative. Also admittied for recc aspiration pna, severe sepsis and chf, Sacral ulers , sp peg Jan 2016: admit for sacral wounds, RAI -, Bialteral DVT - s/p IVC filter June 2017:  Seen by Dr Elsworth Soho in office - possible progression in UIP - consideration of IPF. New GGO 2cm in CT - PET/Biopsy discison July 2017 - ECHO with severe AS with valve area 0.5 and PASP 70mmHg.   EVENTS and  studies  6/29 - admitted with UGIB, intubated for airway protection.  CTA chest / abd / pelv 6/29 > pleural effusions with atelectasis, stable ILD, adenopathy of the chest abdomen and pelvis concerning for metastasis, moderate to large hiatal hernia, ascites in RUQ, cholelithiasis  03/27/16:  Patient remains critically oh on low-dose vasopressor support. Switching to peripheral Neo-Synephrine. . Ongoing family discussions once we have a better understanding of the etiology of the patient's acute decompensation.  6/30 indicates suspected Upper GI Bleed/ Mallory Weiss tear.with plans for EGD once patient has stabilized.She is a DNR with suspected breast cancer metastasis and previously stated she does not want aggressive care  7/4: Failed weaning trial. Family discussion. Pt made DNR. Daughter does not want trach. Considering comfort care if Pt cannot be extubated.   SUBJECTIVE:  Patient has been doing daily weaning trials. She has overall become more awake and is now following commands. She did have some A-fib with RVR (rate in the 200s) overnight and was started on Lopressor prn.  This morning during rounds, goals of care discussion was held with the family and the decision was made to transition patient to full comfort care.  REVIEW OF SYSTEMS:  Unable to obtain given intubation.  VITAL SIGNS: BP 90/47 mmHg  Pulse 101  Temp(Src) 101.7 F (38.7 C) (Oral)  Resp 24  Ht 5\' 2"  (1.575 m)  Wt 67.6 kg (149 lb 0.5 oz)  BMI 27.25 kg/m2  SpO2 98%  HEMODYNAMICS:    VENTILATOR SETTINGS: Vent Mode:  [-]  PRVC FiO2 (%):  [40 %] 40 % Set Rate:  [20 bmp] 20 bmp Vt Set:  [420 mL] 420 mL PEEP:  [5 cmH20] 5 cmH20 Plateau Pressure:  [15 cmH20-21 cmH20] 15 cmH20  INTAKE / OUTPUT: I/O last 3 completed shifts: In: 3602.9 [I.V.:1129.4; Other:20; BE:[id number]; IV Piggyback:318.5] Out: [id number] [Urine:6050; Stool:25]   PHYSICAL EXAMINATION: General: Elderly chronically ill appearing female, critically  ill, very frail, weak cough Neuro: CN grossly intact. Awake, alert, follows commands HEENT: [place]/AT. PERRL, sclerae anicteric. Cardiovascular: Tachycardic, regular rhythm, 3/6 SEM radiating into carotids.  Lungs: Respirations even and unlabored.  Coarse breath sounds and rhonchi diffusely.  Abdomen: BS x 4, soft, NT/ND.  Musculoskeletal: No gross deformities, trace pitting edema Skin: Intact, warm, no rashes.  LABS: PULMONARY  Recent Labs Lab 03/17/2016 1740 02/27/2016 2026 03/27/16 0326  PHART  --  7.341* 7.345*  PCO2ART  --  39.2 40.5  PO2ART  --  137.0* 157.0*  HCO3  --  20.9 22.0  TCO2 19 22 23   O2SAT  --  99.0 99.0    CBC  Recent Labs Lab 03/30/16 0219 03/31/16 0306 04/01/16 0252  HGB 9.8* 10.5* 10.1*  HCT 30.4* 32.1* 31.2*  WBC 10.5 17.0* 20.7*  PLT 99* 114* 135*    COAGULATION  Recent Labs Lab 03/09/2016 1727 03/29/16 0234  INR 1.31 1.39    CARDIAC    Recent Labs Lab 02/28/2016 2214 03/27/16 0019  TROPONINI 0.17* 0.21*   No results for input(s): PROBNP in the last 168 hours.   CHEMISTRY  Recent Labs Lab 03/29/16 0234 03/30/16 0219 03/30/16 1227 03/31/16 0306 03/31/16 1537 04/01/16 0252 04/01/16 0926  04/01/16 2053 04-06-16 0536  NA 135 141  --  137  --  140  --   --  141  --   K 3.8 3.8  --  3.9 3.5 2.9* 3.5  < > 3.4* 3.2*  CL 109 105  --  108  --  103  --   --  105  --   CO2 21* 20*  --  23  --  28  --   --  28  --   GLUCOSE 147* 162*  --  175*  --  179*  --   --  156*  --   BUN 16 17  --  17  --  20  --   --  21*  --   CREATININE 0.67 0.70  --  0.66  --  0.64  --   --  0.59  --   CALCIUM 7.6* 8.9  --  8.0*  --  7.9*  --   --  8.0*  --   MG 1.8 2.0  --  1.8 1.7 1.6* 2.1  --   --   --   PHOS 2.1* 1.9* 2.5 2.9  --  2.6  --   --   --   --   < > = values in this interval not displayed. Estimated Creatinine Clearance: 43.8 mL/min (by C-G formula based on Cr of 0.59).   LIVER  Recent Labs Lab 03/10/2016 1727 03/27/16 1508 03/29/16 0234   AST 45* 28 26  ALT 18 14 16   ALKPHOS 104 73 76  BILITOT 1.4* 1.1 1.2  PROT 6.5 5.4* 5.7*  ALBUMIN 2.4* 1.9* 1.9*  INR 1.31  --  1.39     INFECTIOUS  Recent Labs Lab 03/27/16 0019 03/27/16 1508 03/28/16 1811 03/29/16 0230 03/29/16 0234 03/30/16 0219  LATICACIDVEN 2.4* 2.1*  --  2.3*  --   --   PROCALCITON  --   --  0.25  --  0.16 <0.10     ENDOCRINE CBG (last 3)   Recent Labs  04/01/16 1937 04/01/16 2331 17-Apr-2016 0330  GLUCAP 163* 152* 185*   AUTOIMMUNE: Anti-scleroderma antibody: neg RF: 44 (H) CCP antibodies: neg ANA: neg Anti-La and anti-Ro: neg  IMAGING x48h Dg Chest Port 1 View  04/01/2016  CLINICAL DATA:  Endotracheal intubation EXAM: PORTABLE CHEST 1 VIEW COMPARISON:  Two days ago FINDINGS: Endotracheal tube tip is lower, but tip still above the carina by 2 cm. An orogastric tube tip is at the stomach. IVC filter noted. Patchy airspace opacification is similar to prior. Diffuse interstitial coarsening is improved. No convincing effusion. No pneumothorax. Stable mild cardiomegaly. Mediastinal adenopathy. Background fibrotic lung disease based on chest CT 03/06/2016. IMPRESSION: 1. Lower endotracheal tube, tip 2 cm above the carina. 2. Stable patchy pneumonia. Improved pulmonary edema since 2 days prior. Electronically Signed   By: Monte Fantasia M.D.   On: 04/01/2016 07:14     DISCUSSION: 82 y.o. F admitted 6/29 with hemorrhagic shock due to UGIB and required intubation in ED for airway protection.  GI consulted by EDP . Note .She is currently unresponsive on the ventilator. CXR >> 6/30  indicates bilateral infiltrates vs pulmonary edema, pressor dependent. Will need to have discussion with family regarding realistic goals of care versus continued escalation of care.If she cannot be liberated from life support, progress to comfort care.  ASSESSMENT / PLAN:  GASTROINTESTINAL A:   S/P UGIB. No active bleeding now.  This was the reason for admission. H/O  GERD Cholelithiasis - On CT imaging at admit 03/19/2016 and [place] 03/27/16 Large Hiatal hernia - chronic on imaging  P:   Stop PO PPI Stop TF   HEMATOLOGIC / ONCOLOGIC A:   Anemia- stable. Secondary to UGIB. Leukocytosis - Worsening. Likely stress response vs sepsis. Adenopathy - Seen on CT. H/O Breast Cancer  P:  Stop Daily CBCs Transfuse for Hgb < 7. Stop SCD's Stop Zosyn   CARDIOVASCULAR A:  Shock, resolved - initially related to hemorrhagic shock. The last several days, likely related to sedation. Acute CHF exacerbation (Echo from 03/17/16 with EF 60-65%, severe aortic stenosis) Volume overload Severe Aortic Stenosis Atrial Fibrillation with RVR (HRs 200s)- improved, in NSR this morning  P:  Monitor on telemetry Vitals per unit protocol She has been on low dose Neo-Synephrine, but this was d/c'ed early this morning. Stop Lasix Will give a bolus of fluids and Amiodarone to help with A-fib.   INFECTIOUS  A:   Sepsis :  Possible intra abdominal source.   Possible HCAP R > L. CXR on 7/3 worse infiltrates R>L.   P:   Stop Zosyn.   PULMONARY A: Acute Hypoxemic Respiratory Failure 2/2:  Volume overload/Pulmonary edema  possible HCAP R > L  Unable to protect airway 2/2 UGIB on admission Has ILD/IPF. Multiple Large GGO - new June 2017 - unclear primary with abd adenopathy. Repeat autoimmune labs significant for positive RF (44). Family has decided on full comfort care.  P:   Goals of care discussion with family this morning. Plan to transition to full comfort care. Start Morphine drip Start Atropine Sublingual prn for secretions Stop Zosyn. Plan for extubation this afternoon.  RENAL A:   Acute Renal Failure- resolved. Volume overload Hypokalemia- resolved. P:   Trending UOP with Foley Stop trending electrolytes  ENDOCRINE A:   H/O DM,  last A1c 8.2%. H/O Hypothyroidism. Chronic Prednisone Therapy  P:   Stop q4hr Accu-checks Stop Synthroid   Hydrocortisone IV   NEUROLOGIC A:   Acute Encephalopathy Sedation on Ventilator  P:   Continue Fentanyl pushes and versed pushes.  Start Morphine drip for patient comfort RASS goal: 0 to -1. Daily WUA.  IMMUNOLOGIC A: Polymyalgia - on chronic prednisone. RF positive  P: Hydrocortisone IV q12   Interdisciplinary Family Meeting v Palliative Care Meeting:  Held on 7/6 with Dr. Ashok Cordia. Plan for transition to full comfort care. Will stop all medications that do not provide comfort. Plan for extubation this afternoon.  Hyman Bible, MD Family Medicine, PGY-2 April 04, 2016 8:10 AM   PCCM Attending Note: Patient seen and examined with resident physician. Please refer to her progress note which I reviewed in detail. 82 year old female admitted with upper GI bleed. Bleeding seems to have stopped. Patient's clinical status continues to deteriorate. She has now developed atrial fibrillation with rapid ventricular response and intermittent hypotension with attempts at control utilizing IV Lopressor. Patient's respiratory status remains tenuous and she is not tolerating further diuresis I'm unsure whether or not we will be able to safely extubate the patient. She does have ongoing fever and when coupled with her lymphadenopathy I feel this is likely secondary to an underlying malignancy rather than an infectious process. I had a lengthy discussion with the patient's daughters at bedside today outlining her multiple medical problems and failure to improve. They informed me that given her previous wishes they wish to proceed with focusing on full comfort and extubating the patient today again for her comfort. We are starting an amiodarone infusion in an effort to control the patient's heart rate and thereby improve her comfort. I have also ordered morphine available to relieve any dyspnea and explained to the patient's daughters that this was its intent as well as for pain relief. They wish to remain at  bedside to be with the patient during extubation. I offered a hospital chaplain support As well as my sympathies with this tough decision. we will continue to support the patient's comfort and family needs as she transitions. She will remain in the intensive care unit if she survives through the night. I did explain to them that she may pass over the course of minutes to hours or even a couple of days.  I have spent a total of 45 minutes of critical care time today caring for the patient, reviewing the patient's electronic medical record, discussing the plan of care with nurse and resident at bedside, and in family discussion with the patient's daughters at bedside.  Sonia Baller Ashok Cordia, M.D. [facility] Pulmonary & Critical Care Pager:  [phone] After 3pm or if no response, call [phone] 2:46 PM 04-Apr-2016

## 2016-04-28 NOTE — Progress Notes (Signed)
eLink Physician-Brief Progress Note Patient Name: Martha Mcbride DOB: 1924/12/03 MRN: [id number]   Date of Service  April 08, 2016  HPI/Events of Note  Nursing requesting meds for comfort, already on MS 20 mg /h  eICU Interventions  Increased limit to 40 mg / h and added prn ativan 1-2 mg IV q 4h prn     Intervention Category Minor Interventions: Routine modifications to care plan (e.g. PRN medications for pain, fever)  Christinia Gully 04-08-16, 8:27 PM

## 2016-04-28 NOTE — Progress Notes (Signed)
Amiodarone Drug - Drug Interaction Consult Note  Recommendations: There are currently no drug drug interactions based on patient's medication profile. Amiodarone is metabolized by the cytochrome P450 system and therefore has the potential to cause many drug interactions. Amiodarone has an average plasma half-life of 50 days (range 20 to 100 days).   There is potential for drug interactions to occur several weeks or months after stopping treatment and the onset of drug interactions may be slow after initiating amiodarone.   []  Statins: Increased risk of myopathy. Simvastatin- restrict dose to 20mg  daily. Other statins: counsel patients to report any muscle pain or weakness immediately.  []  Anticoagulants: Amiodarone can increase anticoagulant effect. Consider warfarin dose reduction. Patients should be monitored closely and the dose of anticoagulant altered accordingly, remembering that amiodarone levels take several weeks to stabilize.  []  Antiepileptics: Amiodarone can increase plasma concentration of phenytoin, the dose should be reduced. Note that small changes in phenytoin dose can result in large changes in levels. Monitor patient and counsel on signs of toxicity.  []  Beta blockers: increased risk of bradycardia, AV block and myocardial depression. Sotalol - avoid concomitant use.  []   Calcium channel blockers (diltiazem and verapamil): increased risk of bradycardia, AV block and myocardial depression.  []   Cyclosporine: Amiodarone increases levels of cyclosporine. Reduced dose of cyclosporine is recommended.  []  Digoxin dose should be halved when amiodarone is started.  []  Diuretics: increased risk of cardiotoxicity if hypokalemia occurs.  []  Oral hypoglycemic agents (glyburide, glipizide, glimepiride): increased risk of hypoglycemia. Patient's glucose levels should be monitored closely when initiating amiodarone therapy.   []  Drugs that prolong the QT interval:  Torsades de pointes  risk may be increased with concurrent use - avoid if possible.  Monitor QTc, also keep magnesium/potassium WNL if concurrent therapy can't be avoided. Marland Kitchen Antibiotics: e.g. fluoroquinolones, erythromycin. . Antiarrhythmics: e.g. quinidine, procainamide, disopyramide, sotalol. . Antipsychotics: e.g. phenothiazines, haloperidol.  . Lithium, tricyclic antidepressants, and methadone. Thank You,   Andrey Cota. Diona Foley, PharmD, BCPS Clinical Pharmacist Pager [phone]  04/26/16 11:59 AM

## 2016-04-28 NOTE — Progress Notes (Signed)
04/06/16  2300   Patient is a DNR. Patient has no heart tones present, pupils are fixed and dilated with EKG monitor reading asystole. Patient's family at the bedside. Time of death is 2250. Elink MD made aware, Chaplin at bedside. Patient's family at bedside. CDS made aware of patient time of death and was released. Patient is not an ME case and no Autopsy was requested. Comfort provided to family.     Mick Sell RN

## 2016-04-28 NOTE — Progress Notes (Signed)
eLink Physician-Brief Progress Note Patient Name: Martha Mcbride DOB: Jan 14, 1925 MRN: [id number]   Date of Service  04/26/16  HPI/Events of Note  AF-RVR  eICU Interventions  lopresso rprn     Intervention Category Major Interventions: Arrhythmia - evaluation and management  Martha Mcbride,Martha V. 04/26/2016, 4:56 AM

## 2016-04-28 NOTE — Progress Notes (Signed)
[facility] ICU Electrolyte Replacement Protocol  Patient Name: SHARNEICE PLEGER DOB: 17-Aug-1925 MRN: [id number]  Date of Service  04/19/2016   HPI/Events of Note    Recent Labs Lab 03/29/16 0234 03/30/16 0219 03/30/16 1227 03/31/16 0306 03/31/16 1537 04/01/16 0252 04/01/16 0926  04/01/16 2053 04/19/2016 0536  NA 135 141  --  137  --  140  --   --  141  --   K 3.8 3.8  --  3.9 3.5 2.9* 3.5  < > 3.4* 3.2*  CL 109 105  --  108  --  103  --   --  105  --   CO2 21* 20*  --  23  --  28  --   --  28  --   GLUCOSE 147* 162*  --  175*  --  179*  --   --  156*  --   BUN 16 17  --  17  --  20  --   --  21*  --   CREATININE 0.67 0.70  --  0.66  --  0.64  --   --  0.59  --   CALCIUM 7.6* 8.9  --  8.0*  --  7.9*  --   --  8.0*  --   MG 1.8 2.0  --  1.8 1.7 1.6* 2.1  --   --   --   PHOS 2.1* 1.9* 2.5 2.9  --  2.6  --   --   --   --   < > = values in this interval not displayed.  Estimated Creatinine Clearance: 43.8 mL/min (by C-G formula based on Cr of 0.59).  Intake/Output      07/05 0701 - 07/06 0700   I.V. (mL/kg) 1009.4 (14.9)   Other 20   NG/GT 1385   IV Piggyback 168.5   Total Intake(mL/kg) 2582.9 (38.2)   Urine (mL/kg/hr) 4250 (2.6)   Stool 25 (0)   Total Output 4275   Net -1692.1        - I/O DETAILED x24h    Total I/O In: 1207.4 [I.V.:443.9; NG/GT:695; IV Piggyback:68.5] Out: 2000 [Urine:2000] - I/O THIS SHIFT    ASSESSMENT   eICURN Interventions  K+ replaced using ICU electrolyte protocol   ASSESSMENT: [place], Lulani Bour Nicole 19-Apr-2016, 6:45 AM

## 2016-04-28 NOTE — Progress Notes (Signed)
   2016/04/10 2332  Clinical Encounter Type  Visited With Patient and family together;Health care provider  Visit Type Initial;Critical Care;Death;Spiritual support  Referral From Nurse  Spiritual Encounters  Spiritual Needs Prayer;Grief support  Stress Factors  Family Stress Factors Loss   Chaplain responded to patient's death. Chaplain met with patient's family. Chaplain relayed funeral home information ([employer] and Environmental manager on [employer]) to [employer]. Chaplain offered prayer and support. Spiritual care services available as needed.   Jeri Lager, Chaplain 2016-04-10 11:34 PM

## 2016-04-28 NOTE — Progress Notes (Signed)
   04-18-16 1200  Clinical Encounter Type  Visited With Patient and family together  Visit Type Follow-up;Spiritual support;Patient actively dying  Referral From Nurse  Consult/Referral To Chaplain  Spiritual Encounters  Spiritual Needs Prayer;Emotional  Stress Factors  Patient Stress Factors Exhausted;Health changes;Loss of control;Major life changes  Family Stress Factors Exhausted;Health changes;Loss of control;Major life changes   Chaplain visited with patient and family per consult.  Family at bedside with patient.  Chaplain offered ministry of presence. Chaplain prayed with family and patient.  Chaplain offered ministry of hospitality to family but they refused. Chaplain asked family if they needed anything right now and they declined.   Vilinda Blanks Tymeshia Awan 2016-04-18 12:04 PM

## 2016-04-28 NOTE — Progress Notes (Signed)
Pt extubated with no complications. Per withdrawal of life support order.

## 2016-04-28 NOTE — Care Management Important Message (Signed)
Important Message  Patient Details  Name: Martha Mcbride MRN: [id number] Date of Birth: 1925-06-05   Medicare Important Message Given:  Yes    [name] April 03, 2016, 10:22 AM

## 2016-04-28 DEATH — deceased

## 2016-06-15 ENCOUNTER — Ambulatory Visit: Payer: Medicare Other | Admitting: Pulmonary Disease
